# Patient Record
Sex: Female | Born: 1955
Health system: Southern US, Community
[De-identification: ages and names within clinical notes are randomized; demographics above are authoritative.]

## PROBLEM LIST (undated history)

## (undated) DIAGNOSIS — I10 Essential (primary) hypertension: Secondary | ICD-10-CM

## (undated) DIAGNOSIS — G8929 Other chronic pain: Secondary | ICD-10-CM

## (undated) DIAGNOSIS — D563 Thalassemia minor: Secondary | ICD-10-CM

## (undated) DIAGNOSIS — Z5189 Encounter for other specified aftercare: Secondary | ICD-10-CM

## (undated) DIAGNOSIS — M549 Dorsalgia, unspecified: Secondary | ICD-10-CM

## (undated) DIAGNOSIS — F419 Anxiety disorder, unspecified: Secondary | ICD-10-CM

## (undated) DIAGNOSIS — M1990 Unspecified osteoarthritis, unspecified site: Secondary | ICD-10-CM

## (undated) DIAGNOSIS — K635 Polyp of colon: Secondary | ICD-10-CM

## (undated) DIAGNOSIS — D573 Sickle-cell trait: Secondary | ICD-10-CM

## (undated) DIAGNOSIS — R011 Cardiac murmur, unspecified: Secondary | ICD-10-CM

## (undated) DIAGNOSIS — K219 Gastro-esophageal reflux disease without esophagitis: Secondary | ICD-10-CM

## (undated) DIAGNOSIS — R1013 Epigastric pain: Secondary | ICD-10-CM

## (undated) DIAGNOSIS — E785 Hyperlipidemia, unspecified: Secondary | ICD-10-CM

## (undated) HISTORY — DX: Sickle-cell trait: D57.3

## (undated) HISTORY — DX: Unspecified osteoarthritis, unspecified site: M19.90

## (undated) HISTORY — DX: Dorsalgia, unspecified: M54.9

## (undated) HISTORY — DX: Hyperlipidemia, unspecified: E78.5

## (undated) HISTORY — DX: Anxiety disorder, unspecified: F41.9

## (undated) HISTORY — DX: Gastro-esophageal reflux disease without esophagitis: K21.9

## (undated) HISTORY — DX: Essential (primary) hypertension: I10

## (undated) HISTORY — DX: Polyp of colon: K63.5

## (undated) HISTORY — DX: Encounter for other specified aftercare: Z51.89

## (undated) HISTORY — DX: Thalassemia minor: D56.3

## (undated) HISTORY — DX: Cardiac murmur, unspecified: R01.1

## (undated) HISTORY — PX: COLONOSCOPY: SHX174

## (undated) HISTORY — PX: ESOPHAGOGASTRODUODENOSCOPY: SHX1529

## (undated) HISTORY — PX: ABDOMINAL HYSTERECTOMY: SHX81

---

## 2005-03-07 ENCOUNTER — Ambulatory Visit (HOSPITAL_COMMUNITY): Admission: RE | Admit: 2005-03-07 | Discharge: 2005-03-07 | Payer: Self-pay | Admitting: Emergency Medicine

## 2005-07-01 ENCOUNTER — Emergency Department (HOSPITAL_COMMUNITY): Admission: EM | Admit: 2005-07-01 | Discharge: 2005-07-01 | Payer: Self-pay | Admitting: Emergency Medicine

## 2005-07-01 ENCOUNTER — Encounter (INDEPENDENT_AMBULATORY_CARE_PROVIDER_SITE_OTHER): Payer: Self-pay | Admitting: *Deleted

## 2007-06-23 ENCOUNTER — Emergency Department (HOSPITAL_COMMUNITY): Admission: EM | Admit: 2007-06-23 | Discharge: 2007-06-23 | Payer: Self-pay | Admitting: Emergency Medicine

## 2008-10-15 ENCOUNTER — Encounter (INDEPENDENT_AMBULATORY_CARE_PROVIDER_SITE_OTHER): Payer: Self-pay | Admitting: *Deleted

## 2009-07-20 ENCOUNTER — Ambulatory Visit: Payer: Self-pay | Admitting: Gastroenterology

## 2009-07-20 DIAGNOSIS — R1013 Epigastric pain: Secondary | ICD-10-CM

## 2009-07-20 DIAGNOSIS — K3189 Other diseases of stomach and duodenum: Secondary | ICD-10-CM | POA: Insufficient documentation

## 2009-07-29 ENCOUNTER — Telehealth: Payer: Self-pay | Admitting: Gastroenterology

## 2009-08-05 ENCOUNTER — Encounter: Payer: Self-pay | Admitting: Gastroenterology

## 2009-08-08 ENCOUNTER — Telehealth: Payer: Self-pay | Admitting: Gastroenterology

## 2009-08-08 ENCOUNTER — Encounter: Payer: Self-pay | Admitting: Gastroenterology

## 2009-08-12 ENCOUNTER — Encounter: Payer: Self-pay | Admitting: Gastroenterology

## 2010-02-16 ENCOUNTER — Ambulatory Visit (HOSPITAL_COMMUNITY): Admission: RE | Admit: 2010-02-16 | Discharge: 2010-02-16 | Payer: Self-pay | Admitting: Emergency Medicine

## 2011-01-14 ENCOUNTER — Encounter: Payer: Self-pay | Admitting: Emergency Medicine

## 2011-10-01 ENCOUNTER — Other Ambulatory Visit: Payer: Self-pay | Admitting: Gastroenterology

## 2012-04-05 ENCOUNTER — Ambulatory Visit (INDEPENDENT_AMBULATORY_CARE_PROVIDER_SITE_OTHER): Payer: BC Managed Care – PPO | Admitting: Family Medicine

## 2012-04-05 ENCOUNTER — Ambulatory Visit: Payer: BC Managed Care – PPO

## 2012-04-05 DIAGNOSIS — R109 Unspecified abdominal pain: Secondary | ICD-10-CM

## 2012-04-05 LAB — POCT UA - MICROSCOPIC ONLY
Casts, Ur, LPF, POC: NEGATIVE
Yeast, UA: NEGATIVE

## 2012-04-05 LAB — POCT URINALYSIS DIPSTICK
Ketones, UA: 15
Spec Grav, UA: 1.02
pH, UA: 6

## 2012-04-05 LAB — POCT CBC
HCT, POC: 40.9 % (ref 37.7–47.9)
Hemoglobin: 13.3 g/dL (ref 12.2–16.2)
MCH, POC: 24.9 pg — AB (ref 27–31.2)
MCHC: 32.5 g/dL (ref 31.8–35.4)
MCV: 76.4 fL — AB (ref 80–97)
RBC: 5.35 M/uL (ref 4.04–5.48)
RDW, POC: 15.1 %

## 2012-04-05 MED ORDER — ONDANSETRON 4 MG PO TBDP: 4.0000 mg | ORAL_TABLET | Freq: Three times a day (TID) | ORAL | Status: AC | PRN

## 2012-04-05 MED ORDER — TRAMADOL HCL 50 MG PO TABS: 50.0000 mg | ORAL_TABLET | Freq: Three times a day (TID) | ORAL | Status: AC | PRN

## 2012-04-05 NOTE — Progress Notes (Signed)
Subjective:    Patient ID: Emily [name], female    DOB: 1956/07/27, 56 y.o.   MRN: [id number]  HPI 56 yo female with abdominal pain and diarrhea.  Abdominal pain, bilateral, lower for 4 weeks.  Sharp pain.  Constipation previously, diarrhea today x 3.  Takes Nexium.  All the time.  Nexium helps a little.  Pain bad before eating.  No change with bowel movements.  No fever.  Positive dysuria.  No vaginal discharge.  Nausea after eating.  No vomitting.   S/p hysterectomy, nothing else.  10 years ago.    Review of Systems Negative except as per HPI     Objective:   Physical Exam  Constitutional: Vital signs are normal. She appears well-developed and well-nourished. She is active.  Cardiovascular: Normal rate, regular rhythm, normal heart sounds and normal pulses.   Pulmonary/Chest: Effort normal and breath sounds normal.  Abdominal: Soft. Normal appearance and bowel sounds are normal. She exhibits no distension and no mass. There is no hepatosplenomegaly. There is tenderness. There is no rigidity, no rebound, no guarding, no CVA tenderness, no tenderness at McBurney's point and negative Murphy's sign. No hernia.       Diffuse tenderness.  Upper abd worse than lower, though she endorses she feels her pain in the upper abdomen.   Neurological: She is alert.      Results for orders placed in visit on 04/05/12  POCT CBC      Component Value Range   WBC 8.7  4.6 - 10.2 (K/uL)   Lymph, poc 2.9  0.6 - 3.4    POC LYMPH PERCENT 33.1  10 - 50 (%L)   MID (cbc) 0.8  0 - 0.9    POC MID % 9.4  0 - 12 (%M)   POC Granulocyte 5.0  2 - 6.9    Granulocyte percent 57.5  37 - 80 (%G)   RBC 5.35  4.04 - 5.48 (M/uL)   Hemoglobin 13.3  12.2 - 16.2 (g/dL)   HCT, POC 98.1  19.1 - 47.9 (%)   MCV 76.4 (*) 80 - 97 (fL)   MCH, POC 24.9 (*) 27 - 31.2 (pg)   MCHC 32.5  31.8 - 35.4 (g/dL)   RDW, POC 47.8     Platelet Count, POC 332  142 - 424 (K/uL)   MPV 9.0  0 - 99.8 (fL)  POCT UA - MICROSCOPIC ONLY   Component Value Range   WBC, Ur, HPF, POC 0-2     RBC, urine, microscopic 0-2     Bacteria, U Microscopic trace     Mucus, UA trace     Epithelial cells, urine per micros 0-1     Crystals, Ur, HPF, POC trace     Casts, Ur, LPF, POC neg     Yeast, UA neg    POCT URINALYSIS DIPSTICK      Component Value Range   Color, UA yellow     Clarity, UA clear     Glucose, UA neg     Bilirubin, UA neg     Ketones, UA 15     Spec Grav, UA 1.020     Blood, UA neg     pH, UA 6.0     Protein, UA trace     Urobilinogen, UA 0.2     Nitrite, UA neg     Leukocytes, UA Trace     [facility] Primary radiology reading by Dr. Georgiana Shore: NAD    Assessment & Plan:  Abdominal pain - normal CBC, xray, urine.  Question GB disease given pain to palpation in upper abdomen.  Diverticulits possible as well but no discrete tenderness in LLQ and normal white count.  Zofran, tramadol now.  Cmet, lipase penidng.  Set up for abdominal ultrasound to evaluate the gall bladder.

## 2012-04-06 LAB — LIPASE: Lipase: 22 U/L (ref 0–75)

## 2012-04-06 LAB — COMPREHENSIVE METABOLIC PANEL
ALT: 14 U/L (ref 0–35)
Glucose, Bld: 80 mg/dL (ref 70–99)
Sodium: 139 mEq/L (ref 135–145)

## 2012-04-10 ENCOUNTER — Telehealth: Payer: Self-pay | Admitting: Radiology

## 2012-04-10 ENCOUNTER — Ambulatory Visit
Admission: RE | Admit: 2012-04-10 | Discharge: 2012-04-10 | Disposition: A | Discharge status: Home / Self Care | Payer: BC Managed Care – PPO | Source: Ambulatory Visit | Attending: Family Medicine | Admitting: Family Medicine

## 2012-04-10 NOTE — Telephone Encounter (Signed)
Message copied by Levon Hedger A on Thu Apr 10, 2012  2:42 PM ------      Message from: Lamar Laundry      Created: Thu Apr 10, 2012  2:22 PM       Please let patient know her ultrasound was normal.  Is she still having bad abdominal pain?  If so she should return.

## 2012-04-11 NOTE — Telephone Encounter (Signed)
Spoke with daughter and patient is having abdominal pain.  Would like to know if we could refer her for endoscopy?

## 2012-04-11 NOTE — Telephone Encounter (Signed)
LMOM to call back

## 2012-04-11 NOTE — Telephone Encounter (Signed)
Pt daughter is returning clinical phone call # [phone]

## 2012-04-12 NOTE — Telephone Encounter (Signed)
The patient is advised to RTC if her symptoms persist.  Referral can be made to the appropriate specialist at that time.

## 2012-04-12 NOTE — Telephone Encounter (Signed)
Spoke with daughter and advised to have her mom RTC to discuss. She understood

## 2012-06-30 ENCOUNTER — Ambulatory Visit: Payer: BC Managed Care – PPO

## 2012-10-02 ENCOUNTER — Ambulatory Visit: Payer: BC Managed Care – PPO | Admitting: Family Medicine

## 2012-10-02 VITALS — BP 100/68 | HR 73 | Temp 98.7°F | Resp 16 | Ht 65.0 in | Wt 195.0 lb

## 2012-10-02 DIAGNOSIS — E785 Hyperlipidemia, unspecified: Secondary | ICD-10-CM

## 2012-10-02 DIAGNOSIS — I10 Essential (primary) hypertension: Secondary | ICD-10-CM

## 2012-10-02 MED ORDER — PRAVASTATIN SODIUM 80 MG PO TABS: 80.0000 mg | ORAL_TABLET | Freq: Every day | ORAL | Status: DC

## 2012-10-02 MED ORDER — NIFEDIPINE ER 30 MG PO TB24: 30.0000 mg | ORAL_TABLET | Freq: Every day | ORAL | Status: DC

## 2012-10-02 MED ORDER — METOPROLOL TARTRATE 25 MG PO TABS: 25.0000 mg | ORAL_TABLET | Freq: Every morning | ORAL | Status: DC

## 2012-10-02 NOTE — Progress Notes (Signed)
[facility]:  Office Visit  Chief Complaint:  Chief Complaint  Patient presents with  . Medication Refill    HPI: Emily [name] is a 56 y.o. spanish speaking  female who complains of : 1. HTN med refills. No SEs. Taking meds regular/ Denies CP, SOB, dizziness, nausea, vomiting 2. XOL med refills. No SEs. Taking meds regular. She denies any muscular aches/pains. Has not fasted.  She was seen here in April 2013 and labs showed that her Alk Phos was elevated, she ahd abd pain at the time. A RUQ [place] was done which was normal. She has been to the GI specialist and they have dx her with GERD.  Past Medical History  Diagnosis Date  . Hypertension   . Hyperlipidemia   . Back pain   . GERD (gastroesophageal reflux disease)    History reviewed. No pertinent past surgical history. History   Social History  . Marital Status: Married    Spouse Name: [name]    Number of Children: [name]  . Years of Education: [name]   Social History Main Topics  . Smoking status: Never Smoker   . Smokeless tobacco: None  . Alcohol Use: Yes  . Drug Use: No  . Sexually Active: None   Other Topics Concern  . None   Social History Narrative  . None   Family History  Problem Relation Age of Onset  . Aneurysm Mother    Allergies  Allergen Reactions  . Aspirin   . Nsaids    Prior to Admission medications   Medication Sig Start Date End Date Taking? Authorizing Provider  metoprolol tartrate (LOPRESSOR) 25 MG tablet Take 25 mg by mouth every morning.   Yes Historical Provider, MD  NIFEdipine (PROCARDIA-XL/ADALAT CC) 30 MG 24 hr tablet Take 30 mg by mouth daily.   Yes Historical Provider, MD  pravastatin (PRAVACHOL) 80 MG tablet Take 80 mg by mouth daily.   Yes Historical Provider, MD     ROS: The patient denies fevers, chills, night sweats, unintentional weight loss, chest pain, palpitations, wheezing, dyspnea on exertion, nausea, vomiting, abdominal pain, dysuria,  hematuria, melena, numbness, weakness, or tingling.   All other systems have been reviewed and were otherwise negative with the exception of those mentioned in the HPI and as above.    PHYSICAL EXAM: Filed Vitals:   10/02/12 1859  BP: 100/68  Pulse: 73  Temp: 98.7 F (37.1 C)  Resp: 16   Filed Vitals:   10/02/12 1859  Height: 5\' 5"  (1.651 m)  Weight: 195 lb (88.451 kg)   Body mass index is 32.45 kg/(m^2).  General: Alert, no acute distress HEENT:  Normocephalic, atraumatic, oropharynx patent. EOMI, PERRLA, fundoscopic exam grossly nl, no exudates Cardiovascular:  Regular rate and rhythm, no rubs murmurs or gallops.  No Carotid bruits, radial pulse intact. No pedal edema.  Respiratory: Clear to auscultation bilaterally.  No wheezes, rales, or rhonchi.  No cyanosis, no use of accessory musculature GI: No organomegaly, abdomen is soft and non-tender, positive bowel sounds.  No masses. Skin: No rashes. Neurologic: Facial musculature symmetric. Psychiatric: Patient is appropriate throughout our interaction. Lymphatic: No cervical lymphadenopathy Musculoskeletal: Gait intact.   LABS: Results for orders placed in visit on 04/05/12  POCT CBC      Component Value Range   WBC 8.7  4.6 - 10.2 K/uL   Lymph, poc 2.9  0.6 - 3.4   POC LYMPH PERCENT 33.1  10 - 50 %L  MID (cbc) 0.8  0 - 0.9   POC MID % 9.4  0 - 12 %M   POC Granulocyte 5.0  2 - 6.9   Granulocyte percent 57.5  37 - 80 %G   RBC 5.35  4.04 - 5.48 M/uL   Hemoglobin 13.3  12.2 - 16.2 g/dL   HCT, POC 78.2  95.6 - 47.9 %   MCV 76.4 (*) 80 - 97 fL   MCH, POC 24.9 (*) 27 - 31.2 pg   MCHC 32.5  31.8 - 35.4 g/dL   RDW, POC 21.3     Platelet Count, POC 332  142 - 424 K/uL   MPV 9.0  0 - 99.8 fL  POCT UA - MICROSCOPIC ONLY      Component Value Range   WBC, Ur, HPF, POC 0-2     RBC, urine, microscopic 0-2     Bacteria, U Microscopic trace     Mucus, UA trace     Epithelial cells, urine per micros 0-1     Crystals, Ur,  HPF, POC trace     Casts, Ur, LPF, POC neg     Yeast, UA neg    POCT URINALYSIS DIPSTICK      Component Value Range   Color, UA yellow     Clarity, UA clear     Glucose, UA neg     Bilirubin, UA neg     Ketones, UA 15     Spec Grav, UA 1.020     Blood, UA neg     pH, UA 6.0     Protein, UA trace     Urobilinogen, UA 0.2     Nitrite, UA neg     Leukocytes, UA Trace    COMPREHENSIVE METABOLIC PANEL      Component Value Range   Sodium 139  135 - 145 mEq/L   Potassium 3.8  3.5 - 5.3 mEq/L   Chloride 104  96 - 112 mEq/L   CO2 30  19 - 32 mEq/L   Glucose, Bld 80  70 - 99 mg/dL   BUN 14  6 - 23 mg/dL   Creat 0.86  5.78 - 4.69 mg/dL   Total Bilirubin 0.2 (*) 0.3 - 1.2 mg/dL   Alkaline Phosphatase 136 (*) 39 - 117 U/L   AST 18  0 - 37 U/L   ALT 14  0 - 35 U/L   Total Protein 7.7  6.0 - 8.3 g/dL   Albumin 4.1  3.5 - 5.2 g/dL   Calcium 9.9  8.4 - 62.9 mg/dL  LIPASE      Component Value Range   Lipase 22  0 - 75 U/L     EKG/XRAY:   Primary read interpreted by Dr. Conley Rolls at [facility].   ASSESSMENT/PLAN: Encounter Diagnoses  Name Primary?  . HTN (hypertension) Yes  . Hyperlipidemia    Refilled meds x 6 months Will get CMP and also LDL since patient ate Asked pt to return in 6 months for fasting lipids at that time, today only LDL.     Delanee Xin PHUONG, DO 10/03/2012 12:11 AM

## 2012-10-03 ENCOUNTER — Encounter: Payer: Self-pay | Admitting: Family Medicine

## 2012-10-03 DIAGNOSIS — E785 Hyperlipidemia, unspecified: Secondary | ICD-10-CM | POA: Insufficient documentation

## 2012-10-03 DIAGNOSIS — I10 Essential (primary) hypertension: Secondary | ICD-10-CM | POA: Insufficient documentation

## 2012-10-03 LAB — COMPREHENSIVE METABOLIC PANEL WITH GFR
AST: 15 U/L (ref 0–37)
Alkaline Phosphatase: 128 U/L — ABNORMAL HIGH (ref 39–117)
BUN: 14 mg/dL (ref 6–23)
Glucose, Bld: 90 mg/dL (ref 70–99)
Sodium: 137 meq/L (ref 135–145)
Total Bilirubin: 0.3 mg/dL (ref 0.3–1.2)
Total Protein: 7.8 g/dL (ref 6.0–8.3)

## 2012-10-03 LAB — COMPREHENSIVE METABOLIC PANEL
ALT: 10 U/L (ref 0–35)
Albumin: 4.3 g/dL (ref 3.5–5.2)
CO2: 28 mEq/L (ref 19–32)
Calcium: 10.6 mg/dL — ABNORMAL HIGH (ref 8.4–10.5)
Chloride: 100 mEq/L (ref 96–112)
Creat: 0.66 mg/dL (ref 0.50–1.10)
Potassium: 4.2 mEq/L (ref 3.5–5.3)

## 2012-10-03 LAB — LDL CHOLESTEROL, DIRECT: Direct LDL: 174 mg/dL — ABNORMAL HIGH

## 2012-10-23 ENCOUNTER — Encounter: Payer: Self-pay | Admitting: Family Medicine

## 2012-10-31 ENCOUNTER — Ambulatory Visit: Payer: BC Managed Care – PPO | Admitting: Physician Assistant

## 2012-10-31 VITALS — BP 104/74 | HR 72 | Temp 98.1°F | Resp 18 | Ht 65.0 in | Wt 200.2 lb

## 2012-10-31 DIAGNOSIS — R300 Dysuria: Secondary | ICD-10-CM

## 2012-10-31 DIAGNOSIS — R350 Frequency of micturition: Secondary | ICD-10-CM

## 2012-10-31 DIAGNOSIS — N390 Urinary tract infection, site not specified: Secondary | ICD-10-CM

## 2012-10-31 LAB — POCT UA - MICROSCOPIC ONLY

## 2012-10-31 LAB — POCT URINALYSIS DIPSTICK
Bilirubin, UA: NEGATIVE
Ketones, UA: NEGATIVE

## 2012-10-31 MED ORDER — NITROFURANTOIN MONOHYD MACRO 100 MG PO CAPS: 100.0000 mg | ORAL_CAPSULE | Freq: Two times a day (BID) | ORAL | Status: AC

## 2012-10-31 MED ORDER — PHENAZOPYRIDINE HCL 200 MG PO TABS: 200.0000 mg | ORAL_TABLET | Freq: Three times a day (TID) | ORAL | Status: DC | PRN

## 2012-10-31 NOTE — Progress Notes (Signed)
   [address], [place] [address]   Phone [phone]  Subjective:    Patient ID: Emily [name], female    DOB: 02-07-1956, 56 y.o.   MRN: [id number]  HPI Pt presents to clinic with 2 wks h/o dysuria and urinary frequency and urgency.  She has abd pressure and back pain with some chills today.  The back pain is related to a current works comp case and is taking Flexeril - her pain has not changed since her urine symptoms have started.  She is having no vaginal symptoms.  She has not been taking her BP medications over the last month and her BP has been low - she is scared to take the meds because her BP is low.     Review of Systems  Constitutional: Positive for chills. Negative for fever.  Genitourinary: Positive for dysuria, urgency and frequency. Negative for flank pain and vaginal discharge.       Objective:   Physical Exam  Vitals reviewed. Constitutional: She is oriented to person, place, and time. She appears well-developed and well-nourished.  HENT:  Head: Normocephalic and atraumatic.  Right Ear: External ear normal.  Left Ear: External ear normal.  Eyes: Conjunctivae normal are normal.  Cardiovascular: Normal rate, regular rhythm and normal heart sounds.   Pulmonary/Chest: Effort normal.  Abdominal: Soft. There is tenderness (suprapubic). There is no rebound, no guarding and no CVA tenderness.  Neurological: She is alert and oriented to person, place, and time.  Skin: Skin is warm and dry.  Psychiatric: She has a normal mood and affect. Her behavior is normal. Judgment and thought content normal.      Assessment & Plan:   1. Dysuria  POCT UA - Microscopic Only, POCT urinalysis dipstick, phenazopyridine (PYRIDIUM) 200 MG tablet  2. Urinary frequency  POCT UA - Microscopic Only, POCT urinalysis dipstick  3. UTI (lower urinary tract infection)  Urine culture, nitrofurantoin, macrocrystal-monohydrate, (MACROBID) 100 MG capsule   Push fluids.  F/u if  problems.

## 2012-11-02 LAB — URINE CULTURE
Colony Count: NO GROWTH
Organism ID, Bacteria: NO GROWTH

## 2013-03-24 ENCOUNTER — Telehealth: Payer: Self-pay | Admitting: Radiology

## 2013-03-24 NOTE — Telephone Encounter (Signed)
Patients daughter asking for Metoprolol refill, but states is 50mg  daily. Called pharmacy to clarify dose, and she has had both. Dr Conley Rolls sent in Metoprolol Tartrate 25mg . Metoprolol Succinate 50mg  was previously written by Dr Janeice Robinson, but he has denied this. Please clarify what you want her to take.

## 2013-03-25 ENCOUNTER — Emergency Department (HOSPITAL_COMMUNITY): Payer: BC Managed Care – PPO

## 2013-03-25 ENCOUNTER — Emergency Department (HOSPITAL_COMMUNITY)
Admission: EM | Admit: 2013-03-25 | Discharge: 2013-03-25 | Disposition: A | Discharge status: Home / Self Care | Payer: BC Managed Care – PPO | Attending: Emergency Medicine | Admitting: Emergency Medicine

## 2013-03-25 ENCOUNTER — Telehealth: Payer: Self-pay

## 2013-03-25 ENCOUNTER — Encounter (HOSPITAL_COMMUNITY): Payer: Self-pay | Admitting: Emergency Medicine

## 2013-03-25 DIAGNOSIS — R5381 Other malaise: Secondary | ICD-10-CM | POA: Insufficient documentation

## 2013-03-25 DIAGNOSIS — Z8719 Personal history of other diseases of the digestive system: Secondary | ICD-10-CM | POA: Insufficient documentation

## 2013-03-25 DIAGNOSIS — R51 Headache: Secondary | ICD-10-CM | POA: Insufficient documentation

## 2013-03-25 DIAGNOSIS — Z8739 Personal history of other diseases of the musculoskeletal system and connective tissue: Secondary | ICD-10-CM | POA: Insufficient documentation

## 2013-03-25 DIAGNOSIS — E785 Hyperlipidemia, unspecified: Secondary | ICD-10-CM | POA: Insufficient documentation

## 2013-03-25 DIAGNOSIS — R110 Nausea: Secondary | ICD-10-CM | POA: Insufficient documentation

## 2013-03-25 DIAGNOSIS — Z79899 Other long term (current) drug therapy: Secondary | ICD-10-CM | POA: Insufficient documentation

## 2013-03-25 DIAGNOSIS — I10 Essential (primary) hypertension: Secondary | ICD-10-CM | POA: Insufficient documentation

## 2013-03-25 LAB — BASIC METABOLIC PANEL
BUN: 11 mg/dL (ref 6–23)
Calcium: 9.8 mg/dL (ref 8.4–10.5)
Creatinine, Ser: 0.52 mg/dL (ref 0.50–1.10)
GFR calc non Af Amer: >90 mL/min (ref >90)
Glucose, Bld: 77 mg/dL (ref 70–99)

## 2013-03-25 LAB — CBC WITH DIFFERENTIAL/PLATELET
Hemoglobin: 13.1 g/dL (ref 12.0–15.0)
Lymphocytes Relative: 32 % (ref 12–46)
Lymphs Abs: 2.3 10*3/uL (ref 0.7–4.0)
Monocytes Relative: 10 % (ref 3–12)
Neutro Abs: 3.6 10*3/uL (ref 1.7–7.7)
RBC: 5.14 MIL/uL — ABNORMAL HIGH (ref 3.87–5.11)
WBC: 7.1 10*3/uL (ref 4.0–10.5)

## 2013-03-25 MED ORDER — HYDROCODONE-ACETAMINOPHEN 5-325 MG PO TABS: 2.0000 | ORAL_TABLET | ORAL | Status: DC | PRN

## 2013-03-25 MED ORDER — METOPROLOL SUCCINATE ER 50 MG PO TB24: 50.0000 mg | ORAL_TABLET | Freq: Every day | ORAL | Status: DC

## 2013-03-25 MED ORDER — IOHEXOL 350 MG/ML SOLN
100.0000 mL | Freq: Once | INTRAVENOUS | Status: AC | PRN
  Administered 2013-03-25: 100 mL via INTRAVENOUS

## 2013-03-25 NOTE — ED Notes (Signed)
Since Sunday pt has had intermit headache and weakness daughter noticed that pt is not her normal and slow with walking and talking. Hx of HTN, tinglinging feeling to head and arms. Pt speak spanish.

## 2013-03-25 NOTE — ED Notes (Signed)
Patient transported to CT 

## 2013-03-25 NOTE — Telephone Encounter (Signed)
I have sent Metop XL 50mg  to be taken once daily.  She should STOP taking the metop tartrate

## 2013-03-25 NOTE — Telephone Encounter (Signed)
It looks like patient has been sent in the immediate release and extended once daily dosing.  I think that it would be best for patient to RTC because I saw her last and per my note she was having hypotensive episodes.  When she RTC we can figure out which med is best for her.

## 2013-03-25 NOTE — Telephone Encounter (Signed)
There were 2 messages on this, patient was advised previous message she should come in for recheck prior to Rx being given, because she has episodes of hypotension and she has been given 3 different Rx by 2 different providers, unclear at pharmacy and unclear here what she is to take. Daughter aware.

## 2013-03-25 NOTE — Telephone Encounter (Signed)
Thanks, I called her daughter and advised.

## 2013-03-25 NOTE — Telephone Encounter (Signed)
PATIENT'S DAUGHTER IS CALLING. STATES THAT HER MOTHER LAST SAW DR LE AND THAT SHE WROTE THE WRONG RX FOR HER MOTHER. PATIENT'S DAUGHTER WAS EXTREMELY HARD TO UNDERSTAND AND MUFFLED. DID NOT GET HER NAME BUT HER CALL BACK NUMBER IS: [phone]

## 2013-03-25 NOTE — Telephone Encounter (Signed)
Please advise patients daughter requesting Metoprolol Succinate 50 as written at another practice., Dr Conley Rolls gave Metoprolol Tartrate 25mg  . Pharmacy indicates patient has been on both of these recently, message sent yesterday also to Dr Conley Rolls. This will have to go to her again, but to you first, if you have advise, please advise.

## 2013-03-25 NOTE — ED Provider Notes (Signed)
History     CSN: [id number]  Arrival date & time 03/25/13  1210   First MD Initiated Contact with Patient 03/25/13 1235      Chief Complaint  Patient presents with  . Headache  . Nausea  . Weakness    (Consider location/radiation/quality/duration/timing/severity/associated sxs/prior treatment) HPI Comments: Patient comes to the ER for evaluation of headache. Patient has been complaining of pain in the head for 2 days. Pain is in the posterior aspect of the head and is associated with numbness, tingling and tenderness of the scalp overlying the region. She has not had any neck pain or neck stiffness. No fever. Headache waxes and wanes. No visual disturbance. Denies chest pain. She has had some tingling in the Center of her chest intermittently but no shortness of breath. No alleviating or exacerbating factors of the tingling of the chest.  Patient and daughter are concerned about the headache because there are multiple members of the family who have had cerebral aneurysms.  Patient is a 57 y.o. female presenting with headaches and weakness.  Headache Weakness Associated symptoms include headaches.    Past Medical History  Diagnosis Date  . Hypertension   . Hyperlipidemia   . Back pain   . GERD (gastroesophageal reflux disease)     History reviewed. No pertinent past surgical history.  Family History  Problem Relation Age of Onset  . Aneurysm Mother     History  Substance Use Topics  . Smoking status: Never Smoker   . Smokeless tobacco: Not on file  . Alcohol Use: Yes    OB History   Grav Para Term Preterm Abortions TAB SAB Ect Mult Living                  Review of Systems  Neurological: Positive for weakness and headaches.  All other systems reviewed and are negative.    Allergies  Aspirin and Nsaids  Home Medications   Current Outpatient Rx  Name  Route  Sig  Dispense  Refill  . NIFEdipine (PROCARDIA-XL/ADALAT CC) 30 MG 24 hr tablet   Oral   Take  30 mg by mouth daily.         . pravastatin (PRAVACHOL) 80 MG tablet   Oral   Take 1 tablet (80 mg total) by mouth daily.   30 tablet   6     BP 115/74  Pulse 74  Temp(Src) 98 F (36.7 C) (Oral)  Wt 206 lb 12.7 oz (93.801 kg)  BMI 34.41 kg/m2  SpO2 94%  Physical Exam  Constitutional: She is oriented to person, place, and time. She appears well-developed and well-nourished. No distress.  HENT:  Head: Normocephalic and atraumatic.  Right Ear: Hearing normal.  Nose: Nose normal.  Mouth/Throat: Oropharynx is clear and moist and mucous membranes are normal.  Eyes: Conjunctivae and EOM are normal. Pupils are equal, round, and reactive to light.  Neck: Normal range of motion. Neck supple.  Cardiovascular: Normal rate, regular rhythm, S1 normal and S2 normal.  Exam reveals no gallop and no friction rub.   No murmur heard. Pulmonary/Chest: Effort normal and breath sounds normal. No respiratory distress. She exhibits no tenderness.  Abdominal: Soft. Normal appearance and bowel sounds are normal. There is no hepatosplenomegaly. There is no tenderness. There is no rebound, no guarding, no tenderness at McBurney's point and negative Murphy's sign. No hernia.  Musculoskeletal: Normal range of motion.  Neurological: She is alert and oriented to person, place, and time. She has  normal strength. No cranial nerve deficit or sensory deficit. Coordination normal. GCS eye subscore is 4. GCS verbal subscore is 5. GCS motor subscore is 6.  Skin: Skin is warm, dry and intact. No rash noted. No cyanosis.  Psychiatric: She has a normal mood and affect. Her speech is normal and behavior is normal. Thought content normal.    ED Course  Procedures (including critical care time)  EKG:  Date: 03/25/2013  Rate: 75  Rhythm: normal sinus rhythm  QRS Axis: normal  Intervals: normal  ST/T Wave abnormalities: normal  Conduction Disutrbances:none  Narrative Interpretation:   Old EKG Reviewed: none  available     Labs Reviewed  CBC WITH DIFFERENTIAL - Abnormal; Notable for the following:    RBC 5.14 (*)    MCV 74.1 (*)    MCH 25.5 (*)    All other components within normal limits  BASIC METABOLIC PANEL  TROPONIN I   Ct Head Wo Contrast  03/25/2013  *RADIOLOGY REPORT*  Clinical Data: Severe posterior headaches for 1 day.  CT HEAD WITHOUT CONTRAST  Technique:  Contiguous axial images were obtained from the base of the skull through the vertex without contrast.  Comparison: None.  Findings: No acute infarct, hemorrhage, or mass lesion is present. The ventricles are normal size.  No significant extra-axial fluid collection is present.  Mild mucosal thickening is present in the posterior maxillary sinuses bilaterally.  The paranasal sinuses and mastoid air cells are otherwise clear.  The osseous skull is intact.  IMPRESSION:  1.  Normal CT appearance the brain. 2.  Mild maxillary sinus disease.   Original Report Authenticated By: Marin Roberts, M.D.      Diagnosis: Headache    MDM  This presents to the ER for evaluation of headache. Patient has been having intermittent posterior occipital region headache with overlying tenderness. There is no rash or skin changes. Patient concerned about the possibility of aneurysm because of family history. Initial head CT without contrast did not show any abnormality. Patient was therefore sent back for CT angiography of head and this was normal as well. Patient reassured, no further treatment or workup necessary for the headache.  Patient did describe some tingling sensation in her chest. There was no clear evidence of chest pain. She has a normal troponin and EKG. Symptoms have been present for several days. Followup as an outpatient.        Gilda Crease, MD 03/25/13 [phone]

## 2013-03-28 ENCOUNTER — Other Ambulatory Visit: Payer: Self-pay | Admitting: Physician Assistant

## 2013-03-28 ENCOUNTER — Ambulatory Visit: Payer: BC Managed Care – PPO

## 2013-03-28 ENCOUNTER — Ambulatory Visit: Payer: BC Managed Care – PPO | Admitting: Internal Medicine

## 2013-03-28 VITALS — BP 110/77 | HR 66 | Temp 98.6°F | Resp 16 | Ht 66.0 in | Wt 201.0 lb

## 2013-03-28 DIAGNOSIS — R51 Headache: Secondary | ICD-10-CM

## 2013-03-28 DIAGNOSIS — I10 Essential (primary) hypertension: Secondary | ICD-10-CM

## 2013-03-28 DIAGNOSIS — E7800 Pure hypercholesterolemia, unspecified: Secondary | ICD-10-CM

## 2013-03-28 DIAGNOSIS — M62838 Other muscle spasm: Secondary | ICD-10-CM

## 2013-03-28 LAB — LIPID PANEL
LDL Cholesterol: 128 mg/dL — ABNORMAL HIGH (ref 0–99)
Total CHOL/HDL Ratio: 5.2 Ratio
VLDL: 41 mg/dL — ABNORMAL HIGH (ref 0–40)

## 2013-03-28 LAB — COMPREHENSIVE METABOLIC PANEL
ALT: 17 U/L (ref 0–35)
AST: 17 U/L (ref 0–37)
Albumin: 4.2 g/dL (ref 3.5–5.2)
Alkaline Phosphatase: 129 U/L — ABNORMAL HIGH (ref 39–117)
Calcium: 10 mg/dL (ref 8.4–10.5)
Chloride: 102 mEq/L (ref 96–112)
Potassium: 4.3 mEq/L (ref 3.5–5.3)

## 2013-03-28 MED ORDER — NIFEDIPINE ER 30 MG PO TB24: 30.0000 mg | ORAL_TABLET | Freq: Every day | ORAL | Status: DC

## 2013-03-28 MED ORDER — METOPROLOL SUCCINATE ER 25 MG PO TB24: 25.0000 mg | ORAL_TABLET | Freq: Every day | ORAL | Status: DC

## 2013-03-28 MED ORDER — CYCLOBENZAPRINE HCL 5 MG PO TABS: 5.0000 mg | ORAL_TABLET | Freq: Three times a day (TID) | ORAL | Status: DC | PRN

## 2013-03-28 NOTE — Patient Instructions (Addendum)
Please go home and call me with the medications that you are on. I need to know whether you are on Metoprolol succinate or metoprolol tartrate.  For headache - I think it is coming from your neck.  Heat and massage might help.  You will be on muscle relaxers and please take Tylenol for pain.

## 2013-03-28 NOTE — Progress Notes (Signed)
   [address], [place] [address]   Phone [phone]  Subjective:    Patient ID: Emily [name], female    DOB: 1956-04-02, 57 y.o.   MRN: [id number]  HPI Pt presents to clinic with headache and concerns about her BP.  She was in the ED on Wednesday with headache and had a CT scan which was normal as well as a normal exam.  She is concerned because she wakes up most mornings with a headache that is in the back of her head and it gets better with Tylenol.  She has no photophobia or nausea.  She is worried about her BP but is not sure about the toprol she takes. She checks her BP at home and it is mostly in the 130/80-90s.     Pt is out of work on a WC injury on her lower back.     Review of Systems  Constitutional: Negative for fever and chills.  HENT: Negative for congestion and sore throat.   Musculoskeletal: Positive for back pain (from WC injury).  Allergic/Immunologic: Negative for environmental allergies.  Neurological: Positive for headaches.       Objective:   Physical Exam  Vitals reviewed. Constitutional: She is oriented to person, place, and time. She appears well-developed and well-nourished.  HENT:  Head: Normocephalic and atraumatic.  Right Ear: External ear normal.  Left Ear: External ear normal.  Eyes: Conjunctivae are normal.  Cardiovascular: Normal rate, regular rhythm and normal heart sounds.   No murmur heard. Pulmonary/Chest: Effort normal.  Musculoskeletal:       Cervical back: She exhibits tenderness, bony tenderness and spasm.       Back:  Neurological: She is alert and oriented to person, place, and time.  Skin: Skin is warm and dry.  Psychiatric: She has a normal mood and affect. Her behavior is normal. Judgment and thought content normal.    [facility] reading (PRIMARY) by  Dr. Merla Riches. Slight narrowing C4-5 C7-T1, slight decrease in lordosis.    Assessment & Plan:  Pure hypercholesterolemia - - check labs and adjust meds if indicated.   Plan: Lipid panel, Comprehensive metabolic panel  Headache - with a normal CT scan and exam today that points to neck etiology will treat muscle spasm with muscle relaxer and tylenol and heat with massage.  She will plan on RTC in 2 wks if no better and we might consider PT or HA referral.  Plan: DG Cervical Spine 2 or 3 views  Neck muscle spasm - Plan: cyclobenzaprine (FLEXERIL) 5 MG tablet  HTN (hypertension) - Pt pt go home and look at medication name for her Toprol because she has been on 2 different types of Toprol.  I need to know exactly which one she is taking - she is not having problems with hypotension symptoms but with her BP being low we might need to decrease one of her medications.  She will also bring her cuff in next visit for comparison. - Plan: NIFEdipine (PROCARDIA-XL/ADALAT CC) 30 MG 24 hr tablet  Pt speaks minimal English - husband interprets.  I have reviewed and agree with documentation. Robert P. Merla Riches, M.D.

## 2013-04-01 ENCOUNTER — Other Ambulatory Visit: Payer: Self-pay | Admitting: Physician Assistant

## 2013-04-01 DIAGNOSIS — I10 Essential (primary) hypertension: Secondary | ICD-10-CM

## 2013-04-01 DIAGNOSIS — E785 Hyperlipidemia, unspecified: Secondary | ICD-10-CM

## 2013-04-01 MED ORDER — PRAVASTATIN SODIUM 80 MG PO TABS: 80.0000 mg | ORAL_TABLET | Freq: Every day | ORAL | Status: DC

## 2013-04-02 ENCOUNTER — Telehealth: Payer: Self-pay

## 2013-04-02 NOTE — Telephone Encounter (Signed)
LAB RESULTS. [phone].

## 2013-04-02 NOTE — Telephone Encounter (Signed)
Gave daughter lab results

## 2013-04-09 ENCOUNTER — Telehealth: Payer: Self-pay

## 2013-04-09 DIAGNOSIS — E785 Hyperlipidemia, unspecified: Secondary | ICD-10-CM

## 2013-04-09 DIAGNOSIS — I10 Essential (primary) hypertension: Secondary | ICD-10-CM

## 2013-04-09 NOTE — Telephone Encounter (Signed)
Patient's daughter came in to talk about her mother's prescriptions.  Her name was not listed on the form in the patient's documents.  I was unable to give her daughter any information.  Renee' D. assisted.  Luster Landsberg' advised the daughter to have the mother come in and fill out a new form to list her if she wanted her daughter to handle issues for her.  The patient needs to have someone call her that speaks spanish.  The mother's number that is in the system is incorrect according to the daughter.  The correct number is [phone].  This number will be changed once it has been verified by the patient to be correct per Renee'.

## 2013-04-09 NOTE — Telephone Encounter (Signed)
Medications were all sent within the last two weeks:  03/28/13 - Toprol XL 25mg , #30 03/28/13 - Procardia XL 30mg , #30 1RF 04/01/13 - Pravachol 80mg , #30 6RF

## 2013-04-09 NOTE — Telephone Encounter (Signed)
Emily [name] Called the number given and advised these have all been sent. Called and patient is requesting additional refills to have at the pharmacy, a lot of this is lost with language barrier, pended refills on Toprol, others have refills avail.

## 2013-04-09 NOTE — Telephone Encounter (Signed)
Patients medications are confusing, still. Her daughter indicates she needs Metoprolol succinate 25mg , Nifedipine 30mg , and Pravastatin 80mg , please advise if we can send these in. They are pended. Call back number is [phone]. Emily [name]

## 2013-04-09 NOTE — Telephone Encounter (Signed)
Discussed with Benny Lennert PA-C who last saw pt, let's have pt return to recheck BP as her dose of Toprol was changed at last visit.  Then if she is stable, we can send with RFs.

## 2013-04-10 NOTE — Telephone Encounter (Signed)
Thanks, I have called her and advised.

## 2013-04-27 ENCOUNTER — Other Ambulatory Visit: Payer: Self-pay | Admitting: Physician Assistant

## 2013-05-27 ENCOUNTER — Other Ambulatory Visit: Payer: Self-pay | Admitting: Physician Assistant

## 2013-09-25 ENCOUNTER — Other Ambulatory Visit: Payer: Self-pay | Admitting: Physician Assistant

## 2013-09-27 ENCOUNTER — Other Ambulatory Visit: Payer: Self-pay | Admitting: Physician Assistant

## 2013-10-11 ENCOUNTER — Other Ambulatory Visit: Payer: Self-pay | Admitting: Family Medicine

## 2013-10-12 ENCOUNTER — Other Ambulatory Visit: Payer: Self-pay | Admitting: Physician Assistant

## 2013-10-28 ENCOUNTER — Other Ambulatory Visit: Payer: Self-pay | Admitting: Physician Assistant

## 2013-11-15 ENCOUNTER — Other Ambulatory Visit: Payer: Self-pay | Admitting: Physician Assistant

## 2013-11-17 ENCOUNTER — Other Ambulatory Visit: Payer: Self-pay | Admitting: Physician Assistant

## 2013-11-27 ENCOUNTER — Other Ambulatory Visit: Payer: Self-pay | Admitting: Physician Assistant

## 2013-12-02 ENCOUNTER — Other Ambulatory Visit: Payer: Self-pay | Admitting: *Deleted

## 2013-12-02 ENCOUNTER — Other Ambulatory Visit: Payer: Self-pay | Admitting: Physician Assistant

## 2013-12-02 MED ORDER — PRAVASTATIN SODIUM 80 MG PO TABS: 80.0000 mg | ORAL_TABLET | Freq: Every day | ORAL | Status: DC

## 2013-12-03 ENCOUNTER — Other Ambulatory Visit: Payer: Self-pay | Admitting: Physician Assistant

## 2013-12-03 ENCOUNTER — Telehealth: Payer: Self-pay

## 2013-12-03 NOTE — Telephone Encounter (Signed)
Patient's daughter Toniann Fail calling about her mother's RX for Pravastatin and Metoprolol. States that the pharmacy informed her that she has no more refills left. Explained to patient's daughter that patient needs an OV to get these meds refilled. Patient's daughter states that her mother no longer has insurance and is need of these meds. Please advise.  [phone]

## 2013-12-06 ENCOUNTER — Other Ambulatory Visit: Payer: Self-pay | Admitting: Physician Assistant

## 2013-12-07 NOTE — Telephone Encounter (Signed)
Dr Merla Riches, see also phone message from 12/03/13.

## 2013-12-07 NOTE — Telephone Encounter (Signed)
Amy, I'm not sure you ever saw this and worried that it may not have routed correctly? I discovered message when researching Rx request. I have routed the Rx req to Dr Merla Riches for review, and will send this to him also so he'll have this info.  Dr Merla Riches, I have routed the Rx req also.

## 2013-12-09 NOTE — Telephone Encounter (Signed)
Ok to refill BP and choles meds for 3 months and give her # for [employer]) where it would be free and they speak spanish

## 2013-12-10 MED ORDER — METOPROLOL SUCCINATE ER 25 MG PO TB24: ORAL_TABLET | ORAL | Status: DC

## 2013-12-10 MED ORDER — PRAVASTATIN SODIUM 80 MG PO TABS: ORAL_TABLET | ORAL | Status: DC

## 2013-12-10 NOTE — Telephone Encounter (Signed)
Sent these in for her.

## 2013-12-10 NOTE — Addendum Note (Signed)
Addended byCaffie Damme on: 12/10/2013 03:18 PM   Modules accepted: Orders

## 2013-12-10 NOTE — Telephone Encounter (Signed)
Called daughter Toniann Fail to advise.

## 2013-12-10 NOTE — Telephone Encounter (Signed)
  [address] [place], [place] [address] Practice Phone: [phone]

## 2014-01-02 ENCOUNTER — Ambulatory Visit (INDEPENDENT_AMBULATORY_CARE_PROVIDER_SITE_OTHER): Payer: BC Managed Care – PPO | Admitting: Family Medicine

## 2014-01-02 VITALS — BP 110/80 | HR 75 | Temp 99.0°F | Resp 16 | Ht 65.0 in | Wt 200.0 lb

## 2014-01-02 DIAGNOSIS — R110 Nausea: Secondary | ICD-10-CM

## 2014-01-02 DIAGNOSIS — F418 Other specified anxiety disorders: Secondary | ICD-10-CM

## 2014-01-02 DIAGNOSIS — R079 Chest pain, unspecified: Secondary | ICD-10-CM

## 2014-01-02 DIAGNOSIS — I10 Essential (primary) hypertension: Secondary | ICD-10-CM

## 2014-01-02 DIAGNOSIS — R42 Dizziness and giddiness: Secondary | ICD-10-CM

## 2014-01-02 DIAGNOSIS — R51 Headache: Secondary | ICD-10-CM

## 2014-01-02 DIAGNOSIS — F411 Generalized anxiety disorder: Secondary | ICD-10-CM

## 2014-01-02 LAB — POCT CBC
Granulocyte percent: 62.6 %G (ref 37–80)
HCT, POC: 42.2 % (ref 37.7–47.9)
HEMOGLOBIN: 13.1 g/dL (ref 12.2–16.2)
Lymph, poc: 2.4 (ref 0.6–3.4)
MCH: 25 pg — AB (ref 27–31.2)
MCHC: 31 g/dL — AB (ref 31.8–35.4)
MCV: 80.6 fL (ref 80–97)
MID (cbc): 0.4 (ref 0–0.9)
MPV: 9.6 fL (ref 0–99.8)
POC GRANULOCYTE: 4.6 (ref 2–6.9)
POC LYMPH PERCENT: 32 %L (ref 10–50)
POC MID %: 5.4 % (ref 0–12)
Platelet Count, POC: 297 10*3/uL (ref 142–424)
RBC: 5.23 M/uL (ref 4.04–5.48)
RDW, POC: 14.9 %
WBC: 7.4 10*3/uL (ref 4.6–10.2)

## 2014-01-02 LAB — GLUCOSE, POCT (MANUAL RESULT ENTRY): POC GLUCOSE: 79 mg/dL (ref 70–99)

## 2014-01-02 MED ORDER — ALPRAZOLAM 0.25 MG PO TABS: 0.2500 mg | ORAL_TABLET | Freq: Three times a day (TID) | ORAL | Status: DC | PRN

## 2014-01-02 NOTE — Progress Notes (Signed)
Subjective:    Patient ID: Emily [name], female    DOB: 03-17-1956, 58 y.o.   MRN: [id number]  HPI Emily [name] is a 58 y.o. female  Hx of HTN, last seen in 03/2013 - see notes from that ov, and phone note with plan for repeat eval recommended to discuss medication regimen. Has not followed up since then, but phone note in December - meds refilled for 3 months.  Here today with concerns of blood pressure - higher past 3-4 days. 140/90 2 days ago.  Had chest pain and stabbing sensation in chest - yesterday, all day. Nausea at times, including with chest pain yesterday. No hx of MI or known heart problems.  No chest pain today. Admits to palpitations and slight dizzy when these sx's happened. Noticed pain then checked blood pressure with above reading - took blood pressure medicine.  Thinks is related to new medicine -  nifedipine - different pill from December when picked up from pharmacy.    (thinks different medicine was prescribed by pharmacy in december - but 5-10 minutes spent discussing this and same medicine, but possible different generic, reviewed chart and prior prescriptions)  Also takes metoprolol ER 25mg  qd. No recent change in this dose. Later stated felt dizzy when 1st started taking medicine. No further dizziness, just occasional headache - past 2 days. Did not take blood pressure medicine today - concern of symptoms coming from medicine.   Grandson in hospital currently, she is stressed with this. Denies depression/SI. Some difficulty with sleep d/t anxiousness. Also some difficulty sleeping from being out of work from w/c injury with back. Has taken xanax in past - does not have currently.   Spanish speaking - spanish spoken, interpreter present, with clarification of history at times.  Results for orders placed in visit on 03/28/13  LIPID PANEL      Result Value Range   Cholesterol 209 (*) 0 - 200 mg/dL   Triglycerides 204 (*) <150 mg/dL   HDL 40  >39 mg/dL   Total  CHOL/HDL Ratio 5.2     VLDL 41 (*) 0 - 40 mg/dL   LDL Cholesterol 128 (*) 0 - 99 mg/dL  COMPREHENSIVE METABOLIC PANEL      Result Value Range   Sodium 137  135 - 145 mEq/L   Potassium 4.3  3.5 - 5.3 mEq/L   Chloride 102  96 - 112 mEq/L   CO2 27  19 - 32 mEq/L   Glucose, Bld 87  70 - 99 mg/dL   BUN 11  6 - 23 mg/dL   Creat 0.72  0.50 - 1.10 mg/dL   Total Bilirubin 0.3  0.3 - 1.2 mg/dL   Alkaline Phosphatase 129 (*) 39 - 117 U/L   AST 17  0 - 37 U/L   ALT 17  0 - 35 U/L   Total Protein 7.8  6.0 - 8.3 g/dL   Albumin 4.2  3.5 - 5.2 g/dL   Calcium 10.0  8.4 - 10.5 mg/dL   Patient Active Problem List   Diagnosis Date Noted  . HTN (hypertension) 10/03/2012  . Hyperlipidemia 10/03/2012  . DYSPEPSIA&OTHER [facility] DISORDERS FUNCTION STOMACH 07/20/2009   Past Medical History  Diagnosis Date  . Hypertension   . Hyperlipidemia   . Back pain   . GERD (gastroesophageal reflux disease)    Past Surgical History  Procedure Laterality Date  . Abdominal hysterectomy     Allergies  Allergen Reactions  . Aspirin Hives  .  Nsaids Hives   Prior to Admission medications   Medication Sig Start Date End Date Taking? Authorizing Provider  cyclobenzaprine (FLEXERIL) 5 MG tablet Take 1 tablet (5 mg total) by mouth 3 (three) times daily as needed for muscle spasms. 03/28/13  Yes Mancel Bale, PA-C  metoprolol succinate (TOPROL-XL) 25 MG 24 hr tablet TAKE 1 TABLET BY MOUTH DAILY. NEEDS OFFICE VISIT 12/10/13  Yes Leandrew Koyanagi, MD  NIFEdipine (PROCARDIA-XL/ADALAT CC) 30 MG 24 hr tablet Take 1 tablet (30 mg total) by mouth daily. 03/28/13  Yes Mancel Bale, PA-C  pravastatin (PRAVACHOL) 80 MG tablet TAKE 1 TABLET BY MOUTH DAILY. NEED OFFICE APPOINTMENT FOR FURTHER REFILLS 12/10/13  Yes Leandrew Koyanagi, MD  ALPRAZolam Duanne Moron) 0.25 MG tablet Take 1 tablet (0.25 mg total) by mouth 3 (three) times daily as needed for anxiety. 01/02/14   Wendie Agreste, MD  HYDROcodone-acetaminophen (NORCO/VICODIN)  5-325 MG per tablet Take 2 tablets by mouth every 4 (four) hours as needed for pain. 03/25/13   Orpah Greek, MD  NIFEdipine (PROCARDIA-XL/ADALAT CC) 30 MG 24 hr tablet Take 1 tablet (30 mg total) by mouth daily. Need office visit for additional refills. 3rd Notice. 11/27/13   Fara Chute, PA-C   History   Social History  . Marital Status: Married    Spouse Name: [name]    Number of Children: [name]  . Years of Education: [name]   Occupational History  . Not on file.   Social History Main Topics  . Smoking status: Never Smoker   . Smokeless tobacco: Not on file  . Alcohol Use: Yes  . Drug Use: No  . Sexual Activity: Not on file   Other Topics Concern  . Not on file   Social History Narrative  . No narrative on file    Review of Systems  Constitutional: Negative for fever and chills.  Respiratory: Negative for shortness of breath.   Cardiovascular: Positive for chest pain and palpitations. Negative for leg swelling.  Gastrointestinal: Positive for nausea. Negative for vomiting, abdominal pain and blood in stool.  Neurological: Positive for light-headedness. Negative for dizziness, syncope and headaches.       Objective:   Physical Exam  Vitals reviewed. Constitutional: She is oriented to person, place, and time. She appears well-developed and well-nourished.  HENT:  Head: Normocephalic and atraumatic.  Eyes: Conjunctivae and EOM are normal. Pupils are equal, round, and reactive to light.  Neck: Carotid bruit is not present.  Cardiovascular: Normal rate, regular rhythm, normal heart sounds and intact distal pulses.   Pulmonary/Chest: Effort normal and breath sounds normal.  Abdominal: Soft. She exhibits no pulsatile midline mass. There is no tenderness.  Neurological: She is alert and oriented to person, place, and time. She has normal strength. No sensory deficit.  Nonfocal.   Skin: Skin is warm and dry.  Psychiatric: She has a normal mood and affect. Her behavior  is normal.   Filed Vitals:   01/02/14 1446  BP: 110/80  Pulse: 75  Temp: 99 F (37.2 C)  TempSrc: Oral  Resp: 16  Height: 5\' 5"  (1.651 m)  Weight: 200 lb (90.719 kg)  SpO2: 97%   EKG: SR, twi in III only. No acute findings otherwise. No changes seen from prior EKG.   Results for orders placed in visit on 01/02/14  GLUCOSE, POCT (MANUAL RESULT ENTRY)      Result Value Range   POC Glucose 79  70 - 99 mg/dl  POCT  CBC      Result Value Range   WBC 7.4  4.6 - 10.2 K/uL   Lymph, poc 2.4  0.6 - 3.4   POC LYMPH PERCENT 32.0  10 - 50 %L   MID (cbc) 0.4  0 - 0.9   POC MID % 5.4  0 - 12 %M   POC Granulocyte 4.6  2 - 6.9   Granulocyte percent 62.6  37 - 80 %G   RBC 5.23  4.04 - 5.48 M/uL   Hemoglobin 13.1  12.2 - 16.2 g/dL   HCT, POC 42.2  37.7 - 47.9 %   MCV 80.6  80 - 97 fL   MCH, POC 25.0 (*) 27 - 31.2 pg   MCHC 31.0 (*) 31.8 - 35.4 g/dL   RDW, POC 14.9     Platelet Count, POC 297  142 - 424 K/uL   MPV 9.6  0 - 99.8 fL      Assessment & Plan:   Emily [name] is a 58 y.o. female Chest pain - Plan: POCT glucose (manual entry), Comprehensive metabolic panel, EKG [id number], single episode day prior with associated palpitations, but resolved on own and asx now. No apparent changes on EKG, and underlying stress may be contributory, but to rtc/ER, or call 911 if symptoms return for further evaluation.   Nausea alone - Plan: POCT glucose (manual entry), Comprehensive metabolic panel - intermittent, check CMP, hx of GERD. discuss further at follow up, but rtc precautions given.   Unspecified essential hypertension - controlled in office with systolic [id number] off meds today. Possible falsely elevated readings on home machine - to bring this in and list of home readings at follow up in next 4 days.  If higher readings than past few days, can return sooner or ER precautions as above   Additionally she feels like she may be having side effects to new rx of procardia - discussed this is the same  medicine as prescribed before, but given control in office off meds today - can try to hold Procardia, continue Toprol KL 25 mg qd, and option to increase toprol as well, but can recheck in few days. May not need both rx's.   Situational anxiety - Plan: ALPRAZolam (XANAX) 0.25 MG tablet - family stressor with grandson in hospital. xanax rx given, sed.  Consider counseling.   Headache(784.0) - check home bp's on just toprol and discuss further in follow up in 4 days. Rtc/er precautions.   Lightheadedness - Plan: POCT CBC reassuring and nonfocal neuro exam.  asx at present. rtc if recurs.   Spanish spoken and interpreter present with understanding expressed.    Meds ordered this encounter  Medications  . ALPRAZolam (XANAX) 0.25 MG tablet    Sig: Take 1 tablet (0.25 mg total) by mouth 3 (three) times daily as needed for anxiety.    Dispense:  20 tablet    Refill:  0    Label in spanish   Patient Instructions  Pare procardia en este tiempo (tome metoprolol)  chequie su presion 2 veces al dia y apunte sus [place] y sus numeros regrese a visita en cuatro dias. si tiene dolor en su pecho - llame a 911 o va a cuarto de emergencia.  Xanax si necesario. regrese mas temprano si peor.

## 2014-01-02 NOTE — Patient Instructions (Signed)
Pare procardia en este tiempo (tome metoprolol)  chequie su presion 2 veces al dia y apunte sus [place] y sus numeros regrese a visita en cuatro dias. si tiene dolor en su pecho - llame a 911 o va a cuarto de emergencia.  Xanax si necesario. regrese mas temprano si peor.

## 2014-01-03 LAB — COMPREHENSIVE METABOLIC PANEL
ALT: 15 U/L (ref 0–35)
AST: 16 U/L (ref 0–37)
Albumin: 4.3 g/dL (ref 3.5–5.2)
Alkaline Phosphatase: 132 U/L — ABNORMAL HIGH (ref 39–117)
BILIRUBIN TOTAL: 0.4 mg/dL (ref 0.3–1.2)
BUN: 17 mg/dL (ref 6–23)
CO2: 29 meq/L (ref 19–32)
Calcium: 10.3 mg/dL (ref 8.4–10.5)
Chloride: 103 mEq/L (ref 96–112)
Creat: 0.65 mg/dL (ref 0.50–1.10)
Glucose, Bld: 81 mg/dL (ref 70–99)
POTASSIUM: 4.1 meq/L (ref 3.5–5.3)
SODIUM: 140 meq/L (ref 135–145)
TOTAL PROTEIN: 7.8 g/dL (ref 6.0–8.3)

## 2014-01-11 ENCOUNTER — Ambulatory Visit (INDEPENDENT_AMBULATORY_CARE_PROVIDER_SITE_OTHER): Payer: BC Managed Care – PPO | Admitting: Family Medicine

## 2014-01-11 VITALS — BP 116/72 | HR 76 | Temp 99.0°F | Resp 18 | Ht 65.0 in | Wt 201.0 lb

## 2014-01-11 DIAGNOSIS — I10 Essential (primary) hypertension: Secondary | ICD-10-CM

## 2014-01-11 DIAGNOSIS — E785 Hyperlipidemia, unspecified: Secondary | ICD-10-CM

## 2014-01-11 DIAGNOSIS — R748 Abnormal levels of other serum enzymes: Secondary | ICD-10-CM

## 2014-01-11 MED ORDER — METOPROLOL SUCCINATE ER 25 MG PO TB24: ORAL_TABLET | ORAL | Status: DC

## 2014-01-11 MED ORDER — PRAVASTATIN SODIUM 80 MG PO TABS: ORAL_TABLET | ORAL | Status: DC

## 2014-01-11 NOTE — Progress Notes (Signed)
Subjective:    Patient ID: Emily [name], female    DOB: [birth year], 57 y.o.   MRN: [id number]  This chart was scribed for Emily Agreste, MD by Maree Erie, ED Scribe. The patient was seen in room 9. Patient's care was started at 8:36 PM.  Chief Complaint  Patient presents with  . Follow-up    chest pain  . rx refills    pravachol, nifedipine, toprol   PCP: No primary provider on file.  HPI  Emily [name] is a 58 y.o. female who presents to office for a follow up. Last seen nine days ago with concerns of BP, palpitations, chest pain but had resolution of chest pain by the time she was in the office. Also with situational anxiety with her grandson in the hospital. Additionally had HA, lightheadedness and nausea. Discussed med concerns in office as she felt her new dose of Procardia contributing to her symptoms but BP controlled in office off of medication that day. Held Procardia. Continued Toprol with plan to recheck in a few days. Xanax prescription given last office visit.  EKG showed no acute findings or changes from prior.   Hypertension: She states she has been checking her BP at home. She states that after the last visit she stopped taking the Procardia and was taking one or two tablets of the Toprol intermittently. She would not take medications on days that she believed her BP was dropping too low. Since last being seen, she either took one pill, two pills or no pills. Highest BP recorded was 147/87 this morning. Overall running from 105/76 to 130s/80s. She states that she took two pills this morning but did not take any yesterday. She did not understand the directions last visit to take only one pill of Toprol everyday and increase to two pills only if the BP was too elevated. She has not had any more chest pain, dizziness, nausea since stopping the Procardia. She has had a slight headache however, on days when her BP was elevated.  Abdominal Pain: She has intermittent abdominal  pain that she would like to get checked at the next appointment. She has had this pain for years and denies any changes or worsening of the pain. She takes Nexium for heartburn. She has a lot of gas from the pain and occasional diarrhea. Past surgical history of hysterectomy but no other abdominal surgeries.  Hyperlipidemia: She also states that she needs to have her cholesterol checked.Last lipid panel April 2014, LDL 128, total 204, HDL 40. She last ate a meal at 2 PM but ate an apple at 6 or 7 PM. Lab Results  Component Value Date   CHOL 209* 03/28/2013   HDL 40 03/28/2013   LDLCALC 128* 03/28/2013   LDLDIRECT 174* 10/02/2012   TRIG 204* 03/28/2013   CHOLHDL 5.2 03/28/2013   Elevated ALK Phos of 132 on labs from nine days ago.    She states [facility] is her PCP, but does not have a single provider picked out.   Review of Systems  Constitutional: Negative for fever.  HENT: Negative for drooling.   Eyes: Negative for discharge.  Respiratory: Negative for cough.   Cardiovascular: Negative for leg swelling.  Gastrointestinal: Positive for abdominal pain and diarrhea. Negative for vomiting.  Endocrine: Negative for polyuria.  Genitourinary: Negative for hematuria.  Musculoskeletal: Negative for gait problem.  Skin: Negative for rash.  Allergic/Immunologic: Negative for immunocompromised state.  Neurological: Positive for headaches. Negative for speech difficulty.  Hematological: Negative for adenopathy.  Psychiatric/Behavioral: Negative for confusion.       Objective:   Physical Exam  Nursing note and vitals reviewed. Constitutional: She is oriented to person, place, and time. She appears well-developed and well-nourished. No distress.  HENT:  Head: Normocephalic and atraumatic.  Eyes: Conjunctivae and EOM are normal. Pupils are equal, round, and reactive to light.  Neck: Neck supple. Carotid bruit is not present. No tracheal deviation present.  Cardiovascular: Normal rate, regular rhythm,  normal heart sounds and intact distal pulses.   Pulmonary/Chest: Effort normal and breath sounds normal. No respiratory distress.  Abdominal: Soft. She exhibits no pulsatile midline mass. There is no tenderness. There is negative Murphy's sign.  No RUQ tenderness.   Musculoskeletal: Normal range of motion.  Neurological: She is alert and oriented to person, place, and time.  Skin: Skin is warm and dry.  Psychiatric: She has a normal mood and affect. Her behavior is normal.    Filed Vitals:   01/11/14 2010  BP: 116/72  Pulse: 76  Temp: 99 F (37.2 C)  TempSrc: Oral  Resp: 18  Height: 5\' 5"  (1.651 m)  Weight: 201 lb (91.173 kg)  SpO2: 94%        Assessment & Plan:   Emily [name] is a 58 y.o. female HTN (hypertension) - Plan: metoprolol succinate (TOPROL-XL) 25 MG 24 hr tablet, Comprehensive metabolic panel - overall appears controlled on just metoprolol, but varying dosing may be causing some ups and downs. Recommended consistent 25mg  qd dosing, and keep home BP record. Recheck with numbers in 2-4 weeks, sooner if any return of chest pains, headaches, or other new symptoms.  Spanish spoken and understanding expressed.   Other and unspecified hyperlipidemia - Plan: pravastatin (PRAVACHOL) 80 MG tablet refilled, check lipids.  Comprehensive metabolic panel, Lipid panel  Elevated alkaline phosphatase level - Plan: Comprehensive metabolic panel to recheck.    Meds ordered this encounter  Medications  . metoprolol succinate (TOPROL-XL) 25 MG 24 hr tablet    Sig: TAKE 1 TABLET BY MOUTH DAILY.    Dispense:  90 tablet    Refill:  1  . pravastatin (PRAVACHOL) 80 MG tablet    Sig: TAKE 1 TABLET BY MOUTH DAILY.    Dispense:  90 tablet    Refill:  1   Patient Instructions  Un pastilla de metoprolol cada dia. regrese en 2-4 semanas.  Mas temprano si necesario, o curato de emergencia si tiene dolor de pecho.      I personally performed the services described in this  documentation, which was scribed in my presence. The recorded information has been reviewed and considered, and addended by me as needed.

## 2014-01-11 NOTE — Patient Instructions (Signed)
Un pastilla de metoprolol cada dia. regrese en 2-4 semanas.  Mas temprano si necesario, o curato de emergencia si tiene dolor de pecho.

## 2014-01-12 LAB — COMPREHENSIVE METABOLIC PANEL
ALK PHOS: 135 U/L — AB (ref 39–117)
ALT: 15 U/L (ref 0–35)
AST: 18 U/L (ref 0–37)
Albumin: 4.2 g/dL (ref 3.5–5.2)
BILIRUBIN TOTAL: 0.3 mg/dL (ref 0.3–1.2)
BUN: 10 mg/dL (ref 6–23)
CO2: 26 mEq/L (ref 19–32)
Calcium: 9.5 mg/dL (ref 8.4–10.5)
Chloride: 103 mEq/L (ref 96–112)
Creat: 0.69 mg/dL (ref 0.50–1.10)
Glucose, Bld: 91 mg/dL (ref 70–99)
POTASSIUM: 4.1 meq/L (ref 3.5–5.3)
SODIUM: 137 meq/L (ref 135–145)
TOTAL PROTEIN: 7.4 g/dL (ref 6.0–8.3)

## 2014-01-12 LAB — LIPID PANEL
Cholesterol: 222 mg/dL — ABNORMAL HIGH (ref 0–200)
HDL: 46 mg/dL (ref >39)
LDL CALC: 143 mg/dL — AB (ref 0–99)
Total CHOL/HDL Ratio: 4.8 Ratio
Triglycerides: 166 mg/dL — ABNORMAL HIGH (ref <150)
VLDL: 33 mg/dL (ref 0–40)

## 2014-01-18 ENCOUNTER — Telehealth: Payer: Self-pay | Admitting: Family Medicine

## 2014-01-18 NOTE — Telephone Encounter (Signed)
approx 20 minute phone call. Spanish spoken and clarification of dosing multiple times, but eventually determined that she has been taking 1 metoprolol each day, and BP measured 150/100, and 148/98. Had some headache with this, so started 1/2 pill of nifedipine yesterday and today with her metoprolol. BP 120/79, and 116/80 with this med. Discussed her prior concerns with nifedipine and not recommended to split this formulation. Recommended stopping nifedipine, can increase to (2) of metoprolol qd - total dose of 50mg , and recheck in 3 days with outside BP reading log, and to bring home BP machine to correlate with readings here. orthostatic and low BP precautions discussed, understanding expressed.   Also discussed elevated LDL and CMP results from labs last week, denies missed doses of meds. Already on 80mg  pravastatin qd.  Will discuss other options, possible Crestor at ov in 3 days. Understanding expressed at end of phone call.

## 2014-01-21 ENCOUNTER — Ambulatory Visit: Payer: BC Managed Care – PPO

## 2014-01-22 ENCOUNTER — Other Ambulatory Visit: Payer: Self-pay | Admitting: Physician Assistant

## 2014-01-22 ENCOUNTER — Telehealth: Payer: Self-pay

## 2014-01-22 NOTE — Telephone Encounter (Signed)
Blood Pressure report: 160/101 155/102 150/101  Pt reports to daughter that she has only been taking the Metoprolol. She has had headaches, dizziness, and feels nervousness. She is also taking Xanax.  She was suppose to follow up with you on Thursday but they arrived after you had left for the day.   Please advise if pt should start taking Procardia again or increase Metoprolol.   Advised pt to come in follow up with you on Tuesday. Advised her to come in sooner if the dizziness and headaches return. Pt understands.

## 2014-01-22 NOTE — Telephone Encounter (Signed)
Dr.Greene, [name] daughter Abigail Butts states that the metoprolol is not working pts b/p is low. Pt would like to switch back to the medication that she was taking before switching to metoprolol.  Best# [phone]

## 2014-01-22 NOTE — Telephone Encounter (Signed)
Dr Carlota Raspberry, it appears there is some question about whether you want pt taking this med w/recent phone messages. Please review.

## 2014-01-22 NOTE — Telephone Encounter (Signed)
Lm for Emily [name]- Would like to get what her mother's readings have been and find out what she has been taking. Last ov there was a change in medication. She was taken off of Procardia.

## 2014-01-25 NOTE — Telephone Encounter (Signed)
Call pt - check status.  Was she taking 1 or 2 metoprolol as discussed in last phone message?  If having headache and dizziness - recommend eval by provider today, bring meter to office to verify she is obtaining accurate readings at home. If improved, can follow up with me at 102 tomorrow morning.

## 2014-01-25 NOTE — Telephone Encounter (Signed)
Emily [name] called back and her mother has been taking 2 Metoprolol

## 2014-01-25 NOTE — Telephone Encounter (Signed)
Spoke with Abigail Butts (daughter) and she is going to call me back to let me know if her mother is taking 1 or 2 Metoprolol. She said her mother thinks that stopping previous medication, nifedipine, is what is causing her sxs. She was advised to RTC and states she is going to come in tomorrow 01/26/14 to see Dr. Carlota Raspberry

## 2014-01-26 ENCOUNTER — Other Ambulatory Visit: Payer: Self-pay | Admitting: Physician Assistant

## 2014-01-26 ENCOUNTER — Ambulatory Visit (INDEPENDENT_AMBULATORY_CARE_PROVIDER_SITE_OTHER): Payer: BC Managed Care – PPO | Admitting: Family Medicine

## 2014-01-26 VITALS — BP 110/70 | HR 67 | Temp 98.2°F | Resp 16 | Ht 65.0 in | Wt 202.0 lb

## 2014-01-26 DIAGNOSIS — I10 Essential (primary) hypertension: Secondary | ICD-10-CM

## 2014-01-26 DIAGNOSIS — F418 Other specified anxiety disorders: Secondary | ICD-10-CM

## 2014-01-26 DIAGNOSIS — F411 Generalized anxiety disorder: Secondary | ICD-10-CM

## 2014-01-26 DIAGNOSIS — Z87898 Personal history of other specified conditions: Secondary | ICD-10-CM

## 2014-01-26 DIAGNOSIS — E785 Hyperlipidemia, unspecified: Secondary | ICD-10-CM

## 2014-01-26 MED ORDER — ROSUVASTATIN CALCIUM 20 MG PO TABS: 20.0000 mg | ORAL_TABLET | Freq: Every day | ORAL | Status: DC

## 2014-01-26 MED ORDER — ALPRAZOLAM 0.25 MG PO TABS: 0.2500 mg | ORAL_TABLET | Freq: Three times a day (TID) | ORAL | Status: DC | PRN

## 2014-01-26 MED ORDER — NIFEDIPINE ER OSMOTIC RELEASE 30 MG PO TB24: 30.0000 mg | ORAL_TABLET | Freq: Every day | ORAL | Status: DC

## 2014-01-26 NOTE — Patient Instructions (Signed)
Tome una pastilla metoprolol 25mg  cada dia, una pastilla nifedipina 30mg  cada dia.  cambio pravastatin a crestor 20mg  cada dia.  llame de telefono terapista para hablar ansiedad: Pryor Montes: [phone]  regrese en 2 semanas, mas temprano si tiene nueva simptomas o peoras.

## 2014-01-26 NOTE — Progress Notes (Addendum)
Subjective:  This chart was scribed for Emily Ray, MD by Eugenia Mcalpine, ED Scribe. This patient was seen in room 5 and the patient's care was started at 9:31 AM.   Patient ID: Emily [name], female    DOB: 1956-07-02, 58 y.o.   MRN: [id number]  HPI Spanish speaking pt; here for follow up HTN.  Initially seen jan 10 with concerns of CP and palpitations and concern of possible elevated BP at home.  Had been on metoprolol 25 mg pd , nifedipine 30mg  pd, but had been prescribe a new generic; felt like symptoms were due to this.  At that visit off meds and BP 110/80. Decided to just try metoprolol 25 mg qd.  Follow up on jan 19th intermittent dosing of metoprolol either 1 pill 2 pills or no pill. But resolution of CP and palpitations off the nifedipine. Recommended consistent 25 mg QD dosing and to keep home BP record. Multiple phone calls since that office visit. Jan 26th recommended metoprolol 25 mg qd and to recheck in 3 days. Phone note on jan 30 elevated home BP, 150-160/101-102.   Per that note has been taking 2 metoprolol but had been having HA dizziness and nervousness. Was prescribed xanax at jan 10th visit for situational anxiety.   We had a plan to take 2 pills a day but on that plan for 2 pills a day she had HA and BP 160/110.  When she had elevation of BP she had HA and she felt that her vision was different. She took one of her nifedipine for the past 3 days with 1 metoprolol, and now she feels better. Her home BP for the past 3 days are 126/88, 118/80, 136/81.  She denies CP, HA, and dizziness recently.   Hyperlipidemia: Her lipid panel on jan 19: total cholesterol was 222, LDL was 143, HDL 46, trig 166; currently takes pravastatin 80 mg qd. She has been taking pravastatin everyday with no missed doses and no side effects.   She would like to see a cardiologist for a fast or hard beats that were noted in the past while in [place]  For her situational anxiety the pt has taken xanax  3x a day, and feels like nerves more calm.    Lab Results  Component Value Date   CREATININE 0.69 01/11/2014   BUN 10 01/11/2014   NA 137 01/11/2014   K 4.1 01/11/2014   CL 103 01/11/2014   CO2 26 01/11/2014    Patient Active Problem List   Diagnosis Date Noted  . HTN (hypertension) 10/03/2012  . Hyperlipidemia 10/03/2012  . DYSPEPSIA&OTHER [facility] DISORDERS FUNCTION STOMACH 07/20/2009   Past Medical History  Diagnosis Date  . Hypertension   . Hyperlipidemia   . Back pain   . GERD (gastroesophageal reflux disease)    Past Surgical History  Procedure Laterality Date  . Abdominal hysterectomy     Allergies  Allergen Reactions  . Aspirin Hives  . Nsaids Hives   Prior to Admission medications   Medication Sig Start Date End Date Taking? Authorizing Provider  ALPRAZolam (XANAX) 0.25 MG tablet Take 1 tablet (0.25 mg total) by mouth 3 (three) times daily as needed for anxiety. 01/02/14  Yes Wendie Agreste, MD  cyclobenzaprine (FLEXERIL) 5 MG tablet Take 1 tablet (5 mg total) by mouth 3 (three) times daily as needed for muscle spasms. 03/28/13  Yes Mancel Bale, PA-C  HYDROcodone-acetaminophen (NORCO/VICODIN) 5-325 MG per tablet Take 2 tablets by mouth  every 4 (four) hours as needed for pain. 03/25/13  Yes Orpah Greek, MD  metoprolol succinate (TOPROL-XL) 25 MG 24 hr tablet TAKE 1 TABLET BY MOUTH DAILY. 01/11/14  Yes Wendie Agreste, MD  pravastatin (PRAVACHOL) 80 MG tablet TAKE 1 TABLET BY MOUTH DAILY. 01/11/14  Yes Wendie Agreste, MD   History   Social History  . Marital Status: Married    Spouse Name: [name]    Number of Children: [name]  . Years of Education: [name]   Occupational History  . Not on file.   Social History Main Topics  . Smoking status: Never Smoker   . Smokeless tobacco: Not on file  . Alcohol Use: Yes  . Drug Use: No  . Sexual Activity: Not on file   Other Topics Concern  . Not on file   Social History Narrative  . No narrative on file        Review of Systems  Cardiovascular: Positive for chest pain and palpitations.  Gastrointestinal: Negative for abdominal pain.  Neurological: Positive for dizziness and headaches.  Psychiatric/Behavioral: The patient is nervous/anxious.        Objective:   Physical Exam  Nursing note and vitals reviewed. Constitutional: She is oriented to person, place, and time. She appears well-developed and well-nourished. No distress.  HENT:  Head: Normocephalic and atraumatic.  Eyes: Conjunctivae and EOM are normal. Pupils are equal, round, and reactive to light.  Neck: Neck supple. Carotid bruit is not present. No tracheal deviation present.  Cardiovascular: Normal rate, regular rhythm, normal heart sounds and intact distal pulses.   Pulmonary/Chest: Effort normal and breath sounds normal. No respiratory distress.  Abdominal: Soft. She exhibits no pulsatile midline mass. There is no tenderness.  Musculoskeletal: Normal range of motion.  Neurological: She is alert and oriented to person, place, and time.  Skin: Skin is warm and dry.  Psychiatric: She has a normal mood and affect. Her behavior is normal.    Filed Vitals:   01/26/14 0827  BP: 110/70  Pulse: 67  Temp: 98.2 F (36.8 C)  Resp: 16  Height: 5\' 5"  (1.651 m)  Weight: 202 lb (91.627 kg)  SpO2: 97%      Assessment & Plan:   AEISHA MINARIK is a 58 y.o. female Situational anxiety - Plan: ALPRAZolam (XANAX) 0.25 MG tablet - ok to cont xanax up to tid - discussed prn dosing only, but ph # given to counseling for her to call.  Consider SSRI if persistent frequnt dosing.   HTN (hypertension) - Plan: NIFEdipine (PROCARDIA-XL/ADALAT-CC/NIFEDICAL-XL) 30 MG 24 hr tablet, Ambulatory referral to Cardiology - appears to be at stable dosing with 25mg  metoprolol and 30mg  nifedipine. Cont same doses. Checked home meter in office and readings about 10-20 points higher on her meter.  Notified her of this as reference range. rtc  precautions.  Will arrange for cardiology follow up, but may need prior records from [place] vs.other arrythmia.   Other and unspecified hyperlipidemia - Plan: rosuvastatin (CRESTOR) 20 MG tablet - changed from prava 80 for attempt at better control possible SED, and rtc precautions given.   History of palpitations - Plan: Ambulatory referral to Cardiology as above.   Spanish spoken, understanding expressed.    Meds ordered this encounter  Medications  . NIFEdipine (PROCARDIA-XL/ADALAT-CC/NIFEDICAL-XL) 30 MG 24 hr tablet    Sig: Take 1 tablet (30 mg total) by mouth daily.    Dispense:  30 tablet    Refill:  2  . ALPRAZolam (XANAX) 0.25 MG tablet    Sig: Take 1 tablet (0.25 mg total) by mouth 3 (three) times daily as needed for anxiety.    Dispense:  20 tablet    Refill:  1    Label in spanish  . rosuvastatin (CRESTOR) 20 MG tablet    Sig: Take 1 tablet (20 mg total) by mouth daily.    Dispense:  30 tablet    Refill:  2   Patient Instructions  Tome una pastilla metoprolol 25mg  cada dia, una pastilla nifedipina 30mg  cada dia.  cambio pravastatin a crestor 20mg  cada dia.  llame de telefono terapista para hablar ansiedad: Pryor Montes: [phone]  regrese en 2 semanas, mas temprano si tiene nueva simptomas o peoras.   I personally performed the services described in this documentation, which was scribed in my presence. The recorded information has been reviewed and considered, and addended by me as needed.

## 2014-02-04 ENCOUNTER — Encounter: Payer: Self-pay | Admitting: *Deleted

## 2014-02-05 ENCOUNTER — Institutional Professional Consult (permissible substitution): Payer: BC Managed Care – PPO | Admitting: Cardiology

## 2014-02-24 ENCOUNTER — Other Ambulatory Visit: Payer: Self-pay | Admitting: *Deleted

## 2014-02-26 ENCOUNTER — Institutional Professional Consult (permissible substitution): Payer: BC Managed Care – PPO | Admitting: Cardiology

## 2014-05-31 ENCOUNTER — Ambulatory Visit (INDEPENDENT_AMBULATORY_CARE_PROVIDER_SITE_OTHER): Payer: BC Managed Care – PPO | Admitting: Emergency Medicine

## 2014-05-31 VITALS — BP 124/76 | HR 89 | Temp 98.8°F | Resp 18 | Ht 64.5 in | Wt 200.0 lb

## 2014-05-31 DIAGNOSIS — J029 Acute pharyngitis, unspecified: Secondary | ICD-10-CM

## 2014-05-31 DIAGNOSIS — F411 Generalized anxiety disorder: Secondary | ICD-10-CM

## 2014-05-31 DIAGNOSIS — F418 Other specified anxiety disorders: Secondary | ICD-10-CM

## 2014-05-31 DIAGNOSIS — R6883 Chills (without fever): Secondary | ICD-10-CM

## 2014-05-31 DIAGNOSIS — I10 Essential (primary) hypertension: Secondary | ICD-10-CM

## 2014-05-31 LAB — POCT RAPID STREP A (OFFICE): Rapid Strep A Screen: POSITIVE — AB

## 2014-05-31 MED ORDER — AMOXICILLIN 875 MG PO TABS: 875.0000 mg | ORAL_TABLET | Freq: Two times a day (BID) | ORAL | Status: DC

## 2014-05-31 MED ORDER — ALPRAZOLAM 0.25 MG PO TABS: 0.2500 mg | ORAL_TABLET | Freq: Three times a day (TID) | ORAL | Status: DC | PRN

## 2014-05-31 NOTE — Progress Notes (Signed)
   Subjective:    Patient ID: Emily [name], female    DOB: [birth year], 57 y.o.   MRN: [id number]  HPI 58 year old female pt presents with sore throat, body aches, fever, and chills. She has had the sore throat for 3 days. She feels achy all over her body. Pt also complains of having a headache. Her grandson was recently ill. Pt is also concerned about her BP medication. She states when she takes the medication her BP drops too low so she stops taking it. When she stops taking her BP medication her BP goes up.    Review of Systems  Constitutional: Positive for fever and chills.  HENT: Positive for sore throat.   Neurological: Positive for headaches.       Objective:   Physical Exam patient is alert and cooperative not toxic appearing. TMs are seen there is wax present in both external auditory canals. Tonsils are 1+ and slightly red. There are bilateral tender anterior cervical nodes. Chest is clear to both auscultation and percussion. Results for orders placed in visit on 05/31/14  POCT RAPID STREP A (OFFICE)      Result Value Ref Range   Rapid Strep A Screen Positive (*) Negative   Meds ordered this encounter  Medications  . amoxicillin (AMOXIL) 875 MG tablet    Sig: Take 1 tablet (875 mg total) by mouth 2 (two) times daily.    Dispense:  20 tablet    Refill:  0         Assessment & Plan:  Patient tested positive for strep. Exposure was last week. Will be sure the child they were exposed to get checked. He states that when she takes her procardia extended release 30 her pressure falls too much but if she does not take it her blood pressure goes up. We'll go ahead with cardiology referral. She was not able to go to her last appointment . I did refill her Xanax to take if she gets stressed out. I did not want to start a different blood pressure medication prior to her seeing the cardiologist

## 2014-05-31 NOTE — Patient Instructions (Signed)
Angina por estreptococos  (Strep Throat)   La angina estreptocóccica es una infección en la garganta causada por una bacteria llamada Streptococcus pyogenes. El médico la llamará "amigdalitis" o "faringitis" estreptocóccica, según haya signos de inflamación en las amígdalas o en la zona posterior de la garganta. Es más frecuente en niños entre los 5 y los 15 años durante los meses de frío, pero puede ocurrir a personas de cualquier edad. La infección se transmite de persona a persona (contagio) a través de la tos, el estornudo u otro contacto cercano.   SÍNTOMAS  · Fiebre o escalofríos.  · La garganta o las amígdalas le duelen y están inflamadas.  · Dolor o dificultad para tragar.  · Puntos blancos o amarillos en las amígdalas o la garganta.  · Ganglios linfáticos hinchados a los lados del cuello o debajo de la mandíbula.  · Erupción rojiza en todo el cuerpo (poco común).  DIAGNÓSTICO  Diferentes infecciones pueden causar los mismos síntomas. Para confirmar el diagnóstico deberá hacerse análisis, y así podrán prescribirle el tratamiento adecuado. La "prueba rápida de estreptococo" ayudará al médico a hacer el diagnóstico en algunos minutos. Si no se dispone de la prueba, se hará un rápido hisopado de la zona afectada para hacer un cultivo de las secreciones de la garganta. Si se hace un cultivo, los resultados estarán disponibles en uno o dos días.   TRATAMIENTO  El estreptococo de garganta se trata con antibióticos.  INSTRUCCIONES PARA EL CUIDADO DOMICILIARIO  · Haga gárgaras con 1 cucharadita de sal en 1 taza de agua tibia, 3 ó 4 veces por día o cuando lo crea necesario para sentirse mejor.  · Los miembros de la familia que también tengan dolor en la garganta deben ser evaluados y tratados con antibióticos si tienen la infección.  · Asegúrese de que todas las personas de su casa se laven bien las manos.  · No comparta alimentos, tazas ni utensilios personales que puedan contagiar la infección.  · Coma alimentos  blandos hasta que el dolor de garganta mejore.  · Beba gran cantidad de líquido para mantener la orina de tono claro o color amarillo pálido. Esto ayudará a prevenir la deshidratación.  · Debe hacer reposo.  · No concurra a la escuela o la trabajo hasta que haya tomado los antibióticos durante 24 horas.  · Utilice los medicamentos de venta libre o de prescripción para el dolor, el malestar o la fiebre, según se lo indique el profesional que lo asiste.  · Si le han recetado medicamentos, tómelos según las indicaciones. Finalice la prescripción completa, aunque se sienta mejor.  SOLICITE ATENCIÓN MÉDICA SI:  · Los ganglios del cuello siguen agrandados.  · Aparece una erupción cutánea, tos o dolor de oídos.  · Tiene un catarro verde, amarillo amarronado o esputo sanguinolento.  · Siente dolor o malestar que no puede controlar con los medicamentos.  · Los síntomas parecen empeorar en vez de mejorar.  SOLICITE ATENCIÓN MÉDICA DE INMEDIATO SI:  · Presenta algún nuevo síntoma, como vómitos, dolor de cabeza intenso, rigidez o dolor en el cuello, dolor en el pecho o problemas respiratorios o dificultad para tragar.  · Presenta dolor de garganta intenso, babeo o cambios en la voz.  · Siente que el cuello se hincha o la piel de esa zona se vuelve roja y sensible.  · Tiene fiebre.  · Tiene signos de deshidratación, como fatiga, boca seca y disminución de la orina.  · Comienza a sentir mucho sueño, o no   puede despertarse bien.  Document Released: 09/19/2005 Document Revised: 11/26/2012  [employer]® Patient Information ©2014 [employer].

## 2014-06-23 ENCOUNTER — Other Ambulatory Visit: Payer: Self-pay | Admitting: Family Medicine

## 2014-08-11 ENCOUNTER — Emergency Department (HOSPITAL_COMMUNITY): Payer: BC Managed Care – PPO

## 2014-08-11 ENCOUNTER — Ambulatory Visit (INDEPENDENT_AMBULATORY_CARE_PROVIDER_SITE_OTHER): Payer: BC Managed Care – PPO | Admitting: Family Medicine

## 2014-08-11 ENCOUNTER — Encounter (HOSPITAL_COMMUNITY): Payer: Self-pay | Admitting: Emergency Medicine

## 2014-08-11 ENCOUNTER — Encounter: Payer: Self-pay | Admitting: Family Medicine

## 2014-08-11 ENCOUNTER — Observation Stay (HOSPITAL_COMMUNITY)
Admission: EM | Admit: 2014-08-11 | Discharge: 2014-08-12 | Disposition: A | Discharge status: Home / Self Care | Payer: BC Managed Care – PPO | Attending: Family Medicine | Admitting: Family Medicine

## 2014-08-11 VITALS — BP 142/90 | HR 90 | Temp 98.1°F | Resp 18 | Ht 64.5 in | Wt 206.8 lb

## 2014-08-11 DIAGNOSIS — K219 Gastro-esophageal reflux disease without esophagitis: Secondary | ICD-10-CM | POA: Diagnosis not present

## 2014-08-11 DIAGNOSIS — R0789 Other chest pain: Principal | ICD-10-CM

## 2014-08-11 DIAGNOSIS — R300 Dysuria: Secondary | ICD-10-CM

## 2014-08-11 DIAGNOSIS — M549 Dorsalgia, unspecified: Secondary | ICD-10-CM | POA: Insufficient documentation

## 2014-08-11 DIAGNOSIS — Z79899 Other long term (current) drug therapy: Secondary | ICD-10-CM | POA: Diagnosis not present

## 2014-08-11 DIAGNOSIS — I10 Essential (primary) hypertension: Secondary | ICD-10-CM | POA: Diagnosis not present

## 2014-08-11 DIAGNOSIS — R10816 Epigastric abdominal tenderness: Secondary | ICD-10-CM

## 2014-08-11 DIAGNOSIS — R072 Precordial pain: Secondary | ICD-10-CM

## 2014-08-11 DIAGNOSIS — R079 Chest pain, unspecified: Secondary | ICD-10-CM

## 2014-08-11 DIAGNOSIS — K3189 Other diseases of stomach and duodenum: Secondary | ICD-10-CM

## 2014-08-11 DIAGNOSIS — E7800 Pure hypercholesterolemia, unspecified: Secondary | ICD-10-CM

## 2014-08-11 DIAGNOSIS — E785 Hyperlipidemia, unspecified: Secondary | ICD-10-CM | POA: Diagnosis not present

## 2014-08-11 DIAGNOSIS — Z90710 Acquired absence of both cervix and uterus: Secondary | ICD-10-CM | POA: Insufficient documentation

## 2014-08-11 DIAGNOSIS — R1013 Epigastric pain: Secondary | ICD-10-CM

## 2014-08-11 DIAGNOSIS — R110 Nausea: Secondary | ICD-10-CM | POA: Diagnosis not present

## 2014-08-11 DIAGNOSIS — Z789 Other specified health status: Secondary | ICD-10-CM

## 2014-08-11 LAB — BASIC METABOLIC PANEL
Anion gap: 13 (ref 5–15)
BUN: 9 mg/dL (ref 6–23)
CO2: 26 mEq/L (ref 19–32)
CREATININE: 0.59 mg/dL (ref 0.50–1.10)
Calcium: 9.5 mg/dL (ref 8.4–10.5)
Chloride: 102 mEq/L (ref 96–112)
GFR calc Af Amer: >90 mL/min (ref >90)
GLUCOSE: 93 mg/dL (ref 70–99)
Potassium: 4.3 mEq/L (ref 3.7–5.3)
SODIUM: 141 meq/L (ref 137–147)

## 2014-08-11 LAB — D-DIMER, QUANTITATIVE: D-Dimer, Quant: 0.65 ug/mL-FEU — ABNORMAL HIGH (ref 0.00–0.48)

## 2014-08-11 LAB — CBC
HEMATOCRIT: 37.9 % (ref 36.0–46.0)
Hemoglobin: 12.6 g/dL (ref 12.0–15.0)
MCH: 25.2 pg — ABNORMAL LOW (ref 26.0–34.0)
MCHC: 33.2 g/dL (ref 30.0–36.0)
MCV: 75.8 fL — ABNORMAL LOW (ref 78.0–100.0)
Platelets: 267 10*3/uL (ref 150–400)
RBC: 5 MIL/uL (ref 3.87–5.11)
RDW: 15.3 % (ref 11.5–15.5)
WBC: 7.5 10*3/uL (ref 4.0–10.5)

## 2014-08-11 LAB — POCT URINALYSIS DIPSTICK
Bilirubin, UA: NEGATIVE
Glucose, UA: NEGATIVE
Ketones, UA: NEGATIVE
Nitrite, UA: NEGATIVE
PH UA: 6.5
Protein, UA: NEGATIVE
RBC UA: NEGATIVE
Spec Grav, UA: <=1.005
UROBILINOGEN UA: 0.2

## 2014-08-11 LAB — POCT UA - MICROSCOPIC ONLY
BACTERIA, U MICROSCOPIC: NEGATIVE
CASTS, UR, LPF, POC: NEGATIVE
CRYSTALS, UR, HPF, POC: NEGATIVE
Mucus, UA: NEGATIVE
RBC, urine, microscopic: NEGATIVE
Yeast, UA: NEGATIVE

## 2014-08-11 LAB — I-STAT TROPONIN, ED
Troponin i, poc: 0 ng/mL (ref 0.00–0.08)
Troponin i, poc: 0 ng/mL (ref 0.00–0.08)

## 2014-08-11 MED ORDER — ACETAMINOPHEN 325 MG PO TABS
650.0000 mg | ORAL_TABLET | Freq: Once | ORAL | Status: AC
  Administered 2014-08-11: 650 mg via ORAL
  Filled 2014-08-11: qty 2

## 2014-08-11 MED ORDER — IOHEXOL 350 MG/ML SOLN
100.0000 mL | Freq: Once | INTRAVENOUS | Status: AC | PRN
  Administered 2014-08-11: 100 mL via INTRAVENOUS

## 2014-08-11 NOTE — ED Notes (Signed)
Pt. C/o left sided chest pain with radiation to left arm. Denies N/V, diaphoresis, SOB. Pt. States pain initially started in epigastric region and moved up to chest. Pt. Resting in bed. Alert and oriented x4.

## 2014-08-11 NOTE — ED Provider Notes (Signed)
Medical screening examination/treatment/procedure(s) were conducted as a shared visit with non-physician practitioner(s) and myself.  I personally evaluated the patient during the encounter.   EKG Interpretation None      Date: 08/11/2014  Rate: 72  Rhythm: normal sinus rhythm  QRS Axis: normal  Intervals: normal  ST/T Wave abnormalities: normal  Conduction Disutrbances:none  Narrative Interpretation:   Old EKG Reviewed: none available  Patient here complaining of exertional chest pain and high blood pressure. Seen in urgent care Center and sent here for further evaluation. Elevated d-dimer was noted into the negative CT of the chest. Will be admitted for cardiac workup    Leota Jacobsen, MD 08/11/14 2207

## 2014-08-11 NOTE — Progress Notes (Signed)
Subjective 58 year old lady patient of Dr. Carlota Raspberry who was brought in here by her husband. About a half hour before coming in she was standing in the yard talking and developed substernal pressure chest pain. No radiation to her arm but did have some pain through to her back.. No nausea. No diaphoresis. She took her blood pressure and it was 191/96. She has a history of hypertension. She's been having a lot of edema in her legs. She is on metoprolol and nifedipine. She took an extra metoprolol tartrate and came to the emergency room. She is allergic to aspirin and is not taking aspirin. She has a history of hypercholesterolemia, and has been on a medication for that. She has a cardiology appointment to evaluate her fluctuating blood pressures next month. Patient also complained of dysuria and dark urine. Language barrier-patient speaks some English, mostly Spanish, but husband interpreted well.  Objective: Overweight lady holding her chest. Looks comfortable otherwise. Neck supple without nodes. Chest clear to auscultation. Heart regular without murmurs. Abdomen soft without masses but is tender in the at the epigastrium. She also has some sternal tenderness.  Assessment: Acute pressure chest pain Hypertension Hyperlipidemia Edema Obesity Dysuria Language barrier  Plan: Sent by EMS to ER EKG was done stat. EKG is normal, no change from previous. Patient has sufficient rest factors however that she needs to be sent to the emergency room for enzyme evaluations.  If testing is good, probably should be placed on a PPI.  Avoid aspirin or NSAID as she is allergic.  Spoke with daughter on phon  Results for orders placed in visit on 08/11/14  POCT URINALYSIS DIPSTICK      Result Value Ref Range   Color, UA yellow     Clarity, UA clear     Glucose, UA neg     Bilirubin, UA neg     Ketones, UA neg     Spec Grav, UA <=1.005     Blood, UA neg     pH, UA 6.5     Protein, UA neg     Urobilinogen, UA 0.2     Nitrite, UA neg     Leukocytes, UA Trace    POCT UA - MICROSCOPIC ONLY      Result Value Ref Range   WBC, Ur, HPF, POC 0-2     RBC, urine, microscopic neg     Bacteria, U Microscopic neg     Mucus, UA neg     Epithelial cells, urine per micros 0-2     Crystals, Ur, HPF, POC neg     Casts, Ur, LPF, POC neg     Yeast, UA neg     IV placed.  Patient transported to ER by EMS   45 min direct care

## 2014-08-11 NOTE — ED Notes (Signed)
Per EMS, pt comes from [facility]. Pt. C/o epigastric and midsternal chest pain radiating to back. Pt. Took BP at home 191/104. Taking metoprolol but states "It hasn't been working". No ASA given due to allergy and nitro held for a HA. Also reports leg swelling x1 week after traveling to [place]. Pedal pulses intact bilaterally

## 2014-08-11 NOTE — ED Notes (Signed)
PT monitored by pulse ox, bp cuff, and 12-lead. 

## 2014-08-11 NOTE — ED Notes (Signed)
Called into patient room by family due to patient shaking. Pt. Appears to be shivering. Blankets applied. Family worried that it is the dye used in CT. Pt. Is alert and oriented x4, VSS, otherwise condition has not changed.

## 2014-08-11 NOTE — ED Notes (Addendum)
Updated on plan of care. Family at bedside

## 2014-08-11 NOTE — ED Provider Notes (Signed)
CSN: [id number]     Arrival date & time 08/11/14  1802 History   First MD Initiated Contact with Patient 08/11/14 1809     Chief Complaint  Patient presents with  . Chest Pain     (Consider location/radiation/quality/duration/timing/severity/associated sxs/prior Treatment) HPI Comments: Emily [name] is a 58 y.o. Female with a PMHx of HTN, HLD, and GERD, who presents to the ED complaining of mild intermittent chest pressure in the substernal and L chest, radiating to her L arm (states it feels "heavy"), that began last night at rest and is worse with movement, relieved by certain positions, and associated with nausea and a HA. States she did not try anything for the pain prior to coming here other than taking another dose of metoprolol as well as 2 xanax which didn't help with the pain but helped with her blood pressures. Denies that pain increased or changed with exertion or with inspiration. Endorses feeling "gas" pressure, but denies obstipation or constipation. Denies back pain, diaphoresis, dizziness, fatigue, fever, GERD symptoms, abd pain, LE swelling, syncope, paresthesias, numbness, tingling, palpitations, SOB, vomiting, cough, myalgias or arthralgias. No recent trauma, no hx of DVT/PE, nonsmoker.   Patient is a 58 y.o. female presenting with chest pain. The history is provided by the patient and the spouse. No language interpreter was used.  Chest Pain Pain location:  Substernal area and L chest Pain quality: pressure   Pain radiates to:  L arm Pain radiates to the back: no   Pain severity:  Mild Onset quality:  Gradual Duration:  1 day Timing:  Intermittent Progression:  Unchanged Chronicity:  New Context: movement and at rest   Context: not breathing, not eating and no trauma   Relieved by:  Certain positions Worsened by:  Movement Ineffective treatments:  None tried Associated symptoms: nausea   Associated symptoms: no abdominal pain, no anxiety, no back pain, no  cough, no diaphoresis, no dizziness, no fatigue, no fever, no heartburn, no lower extremity edema, no near-syncope, no numbness, no orthopnea, no palpitations, no shortness of breath, no syncope, not vomiting and no weakness   Risk factors: hypertension and obesity   Risk factors: no prior DVT/PE, no smoking and no surgery     Past Medical History  Diagnosis Date  . Hypertension   . Hyperlipidemia   . Back pain   . GERD (gastroesophageal reflux disease)    Past Surgical History  Procedure Laterality Date  . Abdominal hysterectomy     Family History  Problem Relation Age of Onset  . Aneurysm Mother   . Diabetes Father    History  Substance Use Topics  . Smoking status: Never Smoker   . Smokeless tobacco: Not on file  . Alcohol Use: Yes   OB History   Grav Para Term Preterm Abortions TAB SAB Ect Mult Living                 Review of Systems  Constitutional: Negative for fever, diaphoresis and fatigue.  HENT: Negative for congestion.   Eyes: Negative for visual disturbance.  Respiratory: Positive for chest tightness. Negative for cough, shortness of breath and wheezing.   Cardiovascular: Positive for chest pain. Negative for palpitations, orthopnea, syncope and near-syncope.  Gastrointestinal: Positive for nausea. Negative for heartburn, vomiting, abdominal pain, diarrhea, constipation and abdominal distention.  Genitourinary: Negative for dysuria and hematuria.  Musculoskeletal: Negative for arthralgias, back pain, myalgias and neck pain.  Skin: Negative for color change.  Neurological: Negative for dizziness, syncope,  weakness, light-headedness and numbness.  Psychiatric/Behavioral: Negative for confusion.  10 Systems reviewed and are negative for acute change except as noted in the HPI.     Allergies  Aspirin and Nsaids  Home Medications   Prior to Admission medications   Medication Sig Start Date End Date Taking? Authorizing Provider  metoprolol succinate  (TOPROL-XL) 25 MG 24 hr tablet Take 25 mg by mouth every morning.    Yes Historical Provider, MD  NIFEdipine (PROCARDIA-XL/ADALAT-CC/NIFEDICAL-XL) 30 MG 24 hr tablet Take 1 tablet (30 mg total) by mouth daily. 01/26/14  Yes Wendie Agreste, MD  rosuvastatin (CRESTOR) 20 MG tablet Take 20 mg by mouth at bedtime.    Yes Historical Provider, MD   BP 142/90  Pulse 90  Resp 18  SpO2 95%  Temp 98.1 F (oral)  Physical Exam  Nursing note and vitals reviewed. Constitutional: She is oriented to person, place, and time. Vital signs are normal. She appears well-developed and well-nourished.  Non-toxic appearance. No distress.  VSS, NAD, afebrile  HENT:  Head: Normocephalic and atraumatic.  Nose: Nose normal.  Mouth/Throat: Uvula is midline, oropharynx is clear and moist and mucous membranes are normal.  Eyes: Conjunctivae and EOM are normal. Pupils are equal, round, and reactive to light. Right eye exhibits no discharge. Left eye exhibits no discharge.  Neck: Normal range of motion. Neck supple. No JVD present. Carotid bruit is not present.  Cardiovascular: Normal rate, regular rhythm, normal heart sounds and intact distal pulses.  Exam reveals no gallop and no friction rub.   No murmur heard. RRR, nl s1/s2, no m/r/g, distal pulses equal in all extremities  Pulmonary/Chest: Effort normal and breath sounds normal. No respiratory distress. She has no decreased breath sounds. She has no wheezes. She has no rhonchi. She has no rales. She exhibits tenderness. She exhibits no crepitus, no deformity, no swelling and no retraction.  CTAB in all lung fields. Chest wall TTP in areas where pain was reported, no crepitus or retraction or subQ air  Abdominal: Soft. Normal appearance and bowel sounds are normal. She exhibits no distension. There is no tenderness. There is no rigidity, no rebound, no guarding and no CVA tenderness.  Musculoskeletal: Normal range of motion.  Moving all extremities with ease, strength  5/5 in all extremities, sensation grossly intact in all extremities. Distal pulses intact in all extremities  Neurological: She is alert and oriented to person, place, and time. She has normal strength. No cranial nerve deficit or sensory deficit.  Strength 5/5 in all extremities, sensation grossly intact in all extremities, CN2-12 grossly intact  Skin: Skin is warm, dry and intact. No rash noted.  No erythema or bruising over exposed surfaces  Psychiatric: She has a normal mood and affect.    ED Course  Procedures (including critical care time) Labs Review Labs Reviewed  CBC - Abnormal; Notable for the following:    MCV 75.8 (*)    MCH 25.2 (*)    All other components within normal limits  D-DIMER, QUANTITATIVE - Abnormal; Notable for the following:    D-Dimer, Quant 0.65 (*)    All other components within normal limits  BASIC METABOLIC PANEL  TSH  LIPID PANEL  HEMOGLOBIN A1C  PRO B NATRIURETIC PEPTIDE  TROPONIN I  TROPONIN I  TROPONIN I  I-STAT TROPOININ, ED  Randolm Idol, ED    Imaging Review Dg Chest 2 View  08/11/2014   CLINICAL DATA:  Mid left chest pain for 2 days extending into  the left arm. History of hypertension.  EXAM: CHEST  2 VIEW  COMPARISON:  None.  FINDINGS: The heart is mildly enlarged. The mediastinal contours are normal. There are low lung volumes with linear atelectasis at both lung bases. There is no confluent airspace opacity, edema or pleural effusion. No acute osseous findings are evident.  IMPRESSION: Cardiomegaly with subsegmental atelectasis at both lung bases. No acute cardiopulmonary process.   Electronically Signed   By: Camie Patience M.D.   On: 08/11/2014 19:58   Ct Angio Chest Pe W/cm &/or Wo Cm  08/11/2014   CLINICAL DATA:  Chest pain with left arm weakness and abdominal pain for 2 days. Elevated D-dimer levels. Question acute pulmonary embolism.  EXAM: CT ANGIOGRAPHY CHEST WITH CONTRAST  TECHNIQUE: Multidetector CT imaging of the chest was  performed using the standard protocol during bolus administration of intravenous contrast. Multiplanar CT image reconstructions and MIPs were obtained to evaluate the vascular anatomy.  CONTRAST:  175mL OMNIPAQUE IOHEXOL 350 MG/ML SOLN  COMPARISON:  Chest radiographs 08/11/2014.  Abdominal CT 07/01/2005.  FINDINGS: Vascular: The pulmonary arteries are well opacified with contrast. There is no evidence of acute pulmonary embolism. No significant atherosclerosis is demonstrated.  Mediastinum: There are no enlarged mediastinal, hilar or axillary lymph nodes. The thyroid gland and trachea appear normal. There is a small hiatal hernia. The heart size is normal.  Lungs/Pleura: There is a small amount of fluid within the superior pericardial recess. There is no pleural effusion. Patchy dependent ground-glass opacities in both lungs are most consistent with atelectasis. There is no confluent airspace opacity or suspicious pulmonary nodule.  Upper abdomen: Unremarkable.  There is no adrenal mass.  Musculoskeletal/Chest wall: No chest wall lesion or acute osseous findings.  Review of the MIP images confirms the above findings.  IMPRESSION: No evidence of acute pulmonary embolism or other acute chest process. Small hiatal hernia.   Electronically Signed   By: Camie Patience M.D.   On: 08/11/2014 20:58     EKG Interpretation None      MDM   Final diagnoses:  Other chest pain    57y/o female with atypical reproducible CP. BMP WNL, CBC WNL, trop #1 neg, EKG unremarkable, Ddimer elevated at 0.65 therefore CTA obtained which was negative. Tylenol given with relief of symptoms but they returned after approx 3 hrs. Repeat Trop ordered but consulted Fam Med for admission for ACS r/o given that CP had gone away and then returned with exertion going to the restroom during ED stay. Pt denied administration of morphine several times worried that it would cause a reaction. NTG held initially for BPs, and ASA held due to  allergy. Please see Dr. Ayesha Rumpf dictation regarding documentation of admission.  BP 108/68  Pulse 72  Temp(Src) 98.1 F (36.7 C) (Oral)  Resp 18  Ht 5' 6.93" (1.7 m)  Wt 212 lb 9.6 oz (96.435 kg)  BMI 33.37 kg/m2  SpO2 93%  Meds ordered this encounter  Medications  . acetaminophen (TYLENOL) tablet 650 mg    Sig:        Emily [name], [place] 08/12/14 [phone]

## 2014-08-11 NOTE — ED Notes (Signed)
Pt. States pain to left chest is better, but still having HA

## 2014-08-11 NOTE — H&P (Signed)
[facility] Admission History and Physical Service Pager: [phone]  Patient name: Emily [name] [facility] Medical record number: [id number] Date of birth: 1956/02/01 Age: 58 y.o. Gender: female  Primary Care Provider: No PCP Per Patient Consultants: None Code Status: Full  Chief Complaint: Chest Pain, Shortness of Breath, Hypertension.   Assessment and Plan: KNIYAH KHUN is a 58 y.o. female presenting with acute onset substernal and epigastric chest pain, R arm pain, with SOB in the setting of lower extremity edema x 2 weeks. PMH is significant for HTN, HLD, Obesity, Anxiety.   1. Chest pain, Rule out ACS: Pt. Presenting with new onset chest pain, R arm pain, SOB, and elevated BP's 181/105 initially in the ED in the setting of subjective worsening lower extremity edema x 2 weeks. EKG NSR without acute changes and only evidence of potential left atrial enlargement. Troponin negative x 2, Elevated D-Dimer with subsequent CT chest negative for acute PE.  - Admit to telemetry for observation - Trend troponins. At this time negative x 2 - TSH, HgbA1c in the am - EKG in the am - Echo in the am given new onset lower extremity edema vs. Outpatient echo.  - Morphine prn for chest pain - Sublingual Nitroglycerin prn. No aspirin given Allergy - HEART score 3, ASCVD 10-year 2.6% risk.  likely home tomorrow if rest of work up normal.   - Patient has future appointment with cardiology (Dr. Meda Coffee on 8/25)  2. Hypertension: Blood pressure elevated to 181/105 initially in the ED. Subsequent return to baseline.  - BP's currently stable 120's - 130's / 60's - 70's - Continue home metoprolol 25mg  qd.  - Holding home Procardia at this time. - Will add additional medications if need to control BP.   3. Hyperlipidemia - last lipid panel 12/2013 Cholesterol of 222.  ASCVD 10-year risk 2.6%.  - Lipitor 40 mg (substitued for home Crestor) - current lipid panel pending.     FEN/GI: NPO after midnight/ No GI ppx at this time.  Prophylaxis: Lovenox  Disposition: Will follow up am labs / EKG. Likely home tomorrow with outpatient cardiology follow up as previously scheduled if the rest of her testing is unremarkable  History of Present Illness: Emily [name] is a 58 y.o. female presenting with new onset substernal / epigastric chest pain around 4:30pm today described as tightness and subsequently radiating to her R arm upon arrival in the ED. Her chest pain occurred while standing in the kitchen cooking per the patient, and she experienced SOB at that time. She did complain of mild nausea, however denies vomiting. The chest pain has been intermittent since that time, and since her arrival in the ED. She says that it is 5/10 in severity, and that she has never had this before. Additionally, she says that she has had subjective lower extremity swelling / edema x 2 weeks prior to this episode. She denies SOB with exertion at home, or orthopnea. She has been on Metoprolol and Nifedipine for her HTN. She denies history of smoking, or DM. She does not have a family history of coronary artery disease.   Review Of Systems: Per HPI with the following additions: None Otherwise 12 point review of systems was performed and was unremarkable.  Patient Active Problem List   Diagnosis Date Noted  . HTN (hypertension) 10/03/2012  . Hyperlipidemia 10/03/2012  . DYSPEPSIA&OTHER [facility] DISORDERS FUNCTION STOMACH 07/20/2009   Past Medical History: Past Medical History  Diagnosis Date  .  Hypertension   . Hyperlipidemia   . Back pain   . GERD (gastroesophageal reflux disease)    Past Surgical History: Past Surgical History  Procedure Laterality Date  . Abdominal hysterectomy     Social History: History  Substance Use Topics  . Smoking status: Never Smoker   . Smokeless tobacco: Not on file  . Alcohol Use: Yes   Additional social history: None Please also refer to  relevant sections of EMR.  Family History: Family History  Problem Relation Age of Onset  . Aneurysm Mother   . Diabetes Father    Allergies and Medications: Allergies  Allergen Reactions  . Aspirin Hives  . Nsaids Hives   No current facility-administered medications on file prior to encounter.   Current Outpatient Prescriptions on File Prior to Encounter  Medication Sig Dispense Refill  . NIFEdipine (PROCARDIA-XL/ADALAT-CC/NIFEDICAL-XL) 30 MG 24 hr tablet Take 1 tablet (30 mg total) by mouth daily.  30 tablet  2    Objective: BP 135/75  Pulse 70  Resp 13  SpO2 99% Exam: General: NAD, AAOx3 HEENT: NCAT, PERRLA Cardiovascular: RRR, No murmurs, gallops, or rubs auscultated Respiratory: Mild crackles heard in bilateral lung bases, Upper lung fields clear to auscultation bilaterally. Good effort, non-labored.  Abdomen: Soft, Nontender, nondistended, +BS Extremities: WWP, 2+ distal pulses, Trace lower extremity edema.  Skin: Warm, Dry, No erythema, rashes, or other abnormalities noted.  Neuro: AAOx3, CNII - XII grossly intact, Moving all extremities well.   Labs and Imaging: CBC BMET   Recent Labs Lab 08/11/14 1853  WBC 7.5  HGB 12.6  HCT 37.9  PLT 267    Recent Labs Lab 08/11/14 1853  NA 141  K 4.3  CL 102  CO2 26  BUN 9  CREATININE 0.59  GLUCOSE 93  CALCIUM 9.5     Troponin I - 0.00 x 2 D-Dimer - 0.65  Urinalysis - WNL  EKG 8/19 - Sinus rhythm with biphasic p-waves in V1 suggestive of left atrial enlargement. Similar to EKG 01/02/2014.   CXR 8/19 -  EXAM:  CHEST 2 VIEW  COMPARISON: None.  FINDINGS:  The heart is mildly enlarged. The mediastinal contours are normal.  There are low lung volumes with linear atelectasis at both lung  bases. There is no confluent airspace opacity, edema or pleural  effusion. No acute osseous findings are evident.  IMPRESSION:  Cardiomegaly with subsegmental atelectasis at both lung bases. No  acute cardiopulmonary  process.  Electronically Signed  By: Camie Patience M.D.  On: 08/11/2014 19:58  CTA Chest 8/19 -  EXAM:  CT ANGIOGRAPHY CHEST WITH CONTRAST  TECHNIQUE:  Multidetector CT imaging of the chest was performed using the  standard protocol during bolus administration of intravenous  contrast. Multiplanar CT image reconstructions and MIPs were  obtained to evaluate the vascular anatomy.  CONTRAST: 168mL OMNIPAQUE IOHEXOL 350 MG/ML SOLN  COMPARISON: Chest radiographs 08/11/2014. Abdominal CT 07/01/2005.  FINDINGS:  Vascular: The pulmonary arteries are well opacified with contrast.  There is no evidence of acute pulmonary embolism. No significant  atherosclerosis is demonstrated.  Mediastinum: There are no enlarged mediastinal, hilar or axillary  lymph nodes. The thyroid gland and trachea appear normal. There is a  small hiatal hernia. The heart size is normal.  Lungs/Pleura: There is a small amount of fluid within the superior  pericardial recess. There is no pleural effusion. Patchy dependent  ground-glass opacities in both lungs are most consistent with  atelectasis. There is no confluent airspace opacity or  suspicious  pulmonary nodule.  Upper abdomen: Unremarkable. There is no adrenal mass.  Musculoskeletal/Chest wall: No chest wall lesion or acute osseous  findings.  Review of the MIP images confirms the above findings.  IMPRESSION:  No evidence of acute pulmonary embolism or other acute chest  process. Small hiatal hernia.  Electronically Signed  By: Camie Patience M.D.  On: 08/11/2014 20:58   Aquilla Hacker, MD 08/11/2014, 11:30 PM PGY-1, [place] Intern pager: [phone], text pages welcome  I have seen and evaluated the above patient.  Addendum in blue.  [place] PGY-3

## 2014-08-12 DIAGNOSIS — R0789 Other chest pain: Principal | ICD-10-CM

## 2014-08-12 DIAGNOSIS — R072 Precordial pain: Secondary | ICD-10-CM

## 2014-08-12 DIAGNOSIS — R1013 Epigastric pain: Secondary | ICD-10-CM

## 2014-08-12 DIAGNOSIS — I369 Nonrheumatic tricuspid valve disorder, unspecified: Secondary | ICD-10-CM

## 2014-08-12 DIAGNOSIS — K3189 Other diseases of stomach and duodenum: Secondary | ICD-10-CM

## 2014-08-12 DIAGNOSIS — I10 Essential (primary) hypertension: Secondary | ICD-10-CM | POA: Diagnosis not present

## 2014-08-12 DIAGNOSIS — E785 Hyperlipidemia, unspecified: Secondary | ICD-10-CM | POA: Diagnosis not present

## 2014-08-12 LAB — TSH: TSH: 2 u[IU]/mL (ref 0.350–4.500)

## 2014-08-12 LAB — LIPID PANEL
Cholesterol: 179 mg/dL (ref 0–200)
HDL: 46 mg/dL (ref >39)
LDL Cholesterol: 109 mg/dL — ABNORMAL HIGH (ref 0–99)
TRIGLYCERIDES: 120 mg/dL (ref <150)
Total CHOL/HDL Ratio: 3.9 RATIO
VLDL: 24 mg/dL (ref 0–40)

## 2014-08-12 LAB — HEMOGLOBIN A1C
Hgb A1c MFr Bld: 6.1 % — ABNORMAL HIGH (ref <5.7)
Mean Plasma Glucose: 128 mg/dL — ABNORMAL HIGH (ref <117)

## 2014-08-12 LAB — PRO B NATRIURETIC PEPTIDE: Pro B Natriuretic peptide (BNP): 254.6 pg/mL — ABNORMAL HIGH (ref 0–125)

## 2014-08-12 LAB — TROPONIN I: Troponin I: <0.3 ng/mL (ref <0.30)

## 2014-08-12 MED ORDER — GI COCKTAIL ~~LOC~~: 30.0000 mL | Freq: Four times a day (QID) | ORAL | Status: DC | PRN

## 2014-08-12 MED ORDER — DEXTROSE 50 % IV SOLN: 1.0000 | Freq: Once | INTRAVENOUS | Status: DC

## 2014-08-12 MED ORDER — NITROGLYCERIN 0.4 MG SL SUBL: 0.4000 mg | SUBLINGUAL_TABLET | SUBLINGUAL | Status: DC | PRN

## 2014-08-12 MED ORDER — ONDANSETRON HCL 4 MG/2ML IJ SOLN: 4.0000 mg | Freq: Four times a day (QID) | INTRAMUSCULAR | Status: DC | PRN

## 2014-08-12 MED ORDER — ENOXAPARIN SODIUM 40 MG/0.4ML ~~LOC~~ SOLN
40.0000 mg | SUBCUTANEOUS | Status: DC
  Administered 2014-08-12: 40 mg via SUBCUTANEOUS
  Filled 2014-08-12: qty 0.4

## 2014-08-12 MED ORDER — ACETAMINOPHEN 325 MG PO TABS
650.0000 mg | ORAL_TABLET | ORAL | Status: DC | PRN
  Administered 2014-08-12: 650 mg via ORAL
  Filled 2014-08-12: qty 2

## 2014-08-12 MED ORDER — ATORVASTATIN CALCIUM 40 MG PO TABS
40.0000 mg | ORAL_TABLET | Freq: Every day | ORAL | Status: DC
  Administered 2014-08-12: 40 mg via ORAL
  Filled 2014-08-12: qty 1

## 2014-08-12 MED ORDER — MORPHINE SULFATE 2 MG/ML IJ SOLN
2.0000 mg | INTRAMUSCULAR | Status: DC | PRN
  Filled 2014-08-12: qty 1

## 2014-08-12 MED ORDER — METOPROLOL SUCCINATE ER 25 MG PO TB24
25.0000 mg | ORAL_TABLET | Freq: Every morning | ORAL | Status: DC
  Administered 2014-08-12: 25 mg via ORAL
  Filled 2014-08-12: qty 1

## 2014-08-12 NOTE — H&P (Signed)
FMTS Attending Admission Note: Emily Sabal MD Personal pager:  [phone] FPTS Service Pager:  [phone]  I  have seen and examined this patient, reviewed their chart. I have discussed this patient with the resident. I agree with the resident's findings, assessment and care plan.   Alveda Reasons, MD 08/12/2014 4:20 PM

## 2014-08-12 NOTE — Progress Notes (Signed)
  Echocardiogram 2D Echocardiogram has been performed.  Darlina Sicilian M 08/12/2014, 2:09 PM

## 2014-08-12 NOTE — Progress Notes (Signed)
UR completed 

## 2014-08-12 NOTE — Discharge Instructions (Signed)
You have been admitted for chest pain to rule out a heart attack.   You have not had a heart attack based on your work up at this hospital admission.   You will need to follow up with your Cardiologist Dr. Meda Coffee as you have a previously scheduled appointment.   You will need to follow up with your primary care physician Dr. Carlota Raspberry for hospital follow up. Make an appointment for one week from your discharge date.   We have continued all of your home blood pressure medicines.   If you have any recurrence of chest pain, or any other symptom or concern, then please return to be evaluated in the ED.   Paula Compton MD Family Medicine PGY 1

## 2014-08-12 NOTE — ED Provider Notes (Signed)
Medical screening examination/treatment/procedure(s) were conducted as a shared visit with non-physician practitioner(s) and myself.  I personally evaluated the patient during the encounter.   EKG Interpretation None       Leota Jacobsen, MD 08/12/14 2248

## 2014-08-12 NOTE — Progress Notes (Signed)
Family Medicine Teaching Service Daily Progress Note Intern Pager: [phone]  Patient name: [name] Medical record number: [id number] Date of birth: [birth year] Age: 58 y.o. Gender: female  Primary Care Provider: No PCP Per Patient Consultants: None Code Status: Full  Pt Overview and Major Events to Date:  - 08/12/2014 Pt. Admitted for chest pain and rule out ACS.   Assessment and Plan: Emily [name] is a 57 y.o. female presenting with acute onset substernal and epigastric chest pain, R arm pain, with SOB in the setting of lower extremity edema x 2 weeks. PMH is significant for HTN, HLD, Obesity, Anxiety.   1. Chest pain, Rule out ACS: Pt. Presenting with new onset chest pain, R arm pain, SOB, and elevated BP's 181/105 initially in the ED in the setting of subjective worsening lower extremity edema x 2 weeks. EKG NSR without acute changes and only evidence of potential left atrial enlargement. Troponin negative x 2, Elevated D-Dimer with subsequent CT chest negative for acute PE. ACS ruled out at this point.  - Admitted to telemetry for observation, home today.  - Trend troponins. Negative x 4 - TSH WNL, HgbA1c pending.   - EKG am pending.  - Echo as an outpatient given new onset lower extremity edema. - Morphine prn for chest pain  - Sublingual Nitroglycerin prn. No aspirin given Allergy  - HEART score 3, ASCVD 10-year 2.6% risk. likely home tomorrow if rest of work up normal.  - Patient has future appointment with cardiology (Dr. Meda Coffee on 8/25)   2. Hypertension: Blood pressure elevated to 181/105 initially in the ED. Subsequent return to baseline.  - BP's currently stable low 100's / 60's - 70's - Continue home metoprolol 25mg  qd.  - Blood pressure controlled at this time.  - Holding home Procardia at this time.   3. Hyperlipidemia - last lipid panel 12/2013 Cholesterol of 222. ASCVD 10-year risk 2.6%.  - Lipitor 40 mg (substitued for home Crestor)  - current  lipid panel with mildly elevated LDL at 109, otherwise Normal with statin therapy.   FEN/GI: Heart healthy diet, No GI ppx at this time.  Prophylaxis: Lovenox sub-q   Disposition: Home today with outpatient cards follow up, and pcp follow up for hypertension.   Subjective:  No complaints this am. Chest pain resolved, no further shortness of breath, nausea, or arm pain. She denies headache this am as well. She slept well last night, and is agreeable to discharge this afternoon.   Objective: Temp:  [97.8 F (36.6 C)-98.1 F (36.7 C)] 97.8 F (36.6 C) (08/20 0534) Pulse Rate:  [60-90] 66 (08/20 0534) Resp:  [10-19] 18 (08/20 0534) BP: (97-142)/(62-90) 97/64 mmHg (08/20 0534) SpO2:  [93 %-100 %] 93 % (08/20 0534) Weight:  [206 lb 12.8 oz (93.804 kg)-212 lb 9.6 oz (96.435 kg)] 212 lb 9.6 oz (96.435 kg) (08/20 0045) Physical Exam: General: NAD, AAox3, resting quietly upon entrance today.  Cardiovascular: RRR, No MGR Respiratory: Mild crackles in bilateral bases, upper lung fields clear to auscultation bilaterally, No increased WOB, Comfortable, rate regular.  Abdomen: Nontender, nondistended, Soft, +BS Extremities: WWP, 2+ distal pulses, trace lower extremity edema.   Laboratory:  Recent Labs Lab 08/11/14 1853  WBC 7.5  HGB 12.6  HCT 37.9  PLT 267    Recent Labs Lab 08/11/14 1853  NA 141  K 4.3  CL 102  CO2 26  BUN 9  CREATININE 0.59  CALCIUM 9.5  GLUCOSE 93   Lipid Panel  Component Value Date/Time   CHOL 179 08/12/2014 0156   TRIG 120 08/12/2014 0156   HDL 46 08/12/2014 0156   CHOLHDL 3.9 08/12/2014 0156   VLDL 24 08/12/2014 0156   LDLCALC 109* 08/12/2014 0156      Troponin I - 0.00 x 4 D-Dimer - 0.65  Urinalysis - WNL  TSH - 2.0  EKG 8/19 - Sinus rhythm with biphasic p-waves in V1 suggestive of left atrial enlargement. Similar to EKG 01/02/2014.  EKG 8/20 - pending.    CXR 8/19 -  EXAM:  CHEST 2 VIEW  COMPARISON: None.  FINDINGS:  The heart is  mildly enlarged. The mediastinal contours are normal.  There are low lung volumes with linear atelectasis at both lung  bases. There is no confluent airspace opacity, edema or pleural  effusion. No acute osseous findings are evident.  IMPRESSION:  Cardiomegaly with subsegmental atelectasis at both lung bases. No  acute cardiopulmonary process.  Electronically Signed  By: Camie Patience M.D.  On: 08/11/2014 19:58   CTA Chest 8/19 -  EXAM:  CT ANGIOGRAPHY CHEST WITH CONTRAST  TECHNIQUE:  Multidetector CT imaging of the chest was performed using the  standard protocol during bolus administration of intravenous  contrast. Multiplanar CT image reconstructions and MIPs were  obtained to evaluate the vascular anatomy.  CONTRAST: 158mL OMNIPAQUE IOHEXOL 350 MG/ML SOLN  COMPARISON: Chest radiographs 08/11/2014. Abdominal CT 07/01/2005.  FINDINGS:  Vascular: The pulmonary arteries are well opacified with contrast.  There is no evidence of acute pulmonary embolism. No significant  atherosclerosis is demonstrated.  Mediastinum: There are no enlarged mediastinal, hilar or axillary  lymph nodes. The thyroid gland and trachea appear normal. There is a  small hiatal hernia. The heart size is normal.  Lungs/Pleura: There is a small amount of fluid within the superior  pericardial recess. There is no pleural effusion. Patchy dependent  ground-glass opacities in both lungs are most consistent with  atelectasis. There is no confluent airspace opacity or suspicious  pulmonary nodule.  Upper abdomen: Unremarkable. There is no adrenal mass.  Musculoskeletal/Chest wall: No chest wall lesion or acute osseous  findings.  Review of the MIP images confirms the above findings.  IMPRESSION:  No evidence of acute pulmonary embolism or other acute chest  process. Small hiatal hernia.  Electronically Signed  By: Camie Patience M.D.  On: 08/11/2014 20:58    Aquilla Hacker, MD 08/12/2014, 6:09 AM PGY-1, [place] Intern pager: [phone], text pages welcome

## 2014-08-13 ENCOUNTER — Telehealth: Payer: Self-pay | Admitting: Family Medicine

## 2014-08-13 NOTE — Telephone Encounter (Signed)
Patient's son dropped of FMLA ppw to be completed for him because he will be the care taker of his mother. Papers dropped off on 08/12/2014 and placed in Dr. Clayborn Heron box on 08/13/2014. 5-7 business days for completion.

## 2014-08-15 ENCOUNTER — Other Ambulatory Visit: Payer: Self-pay | Admitting: Family Medicine

## 2014-08-17 ENCOUNTER — Ambulatory Visit (INDEPENDENT_AMBULATORY_CARE_PROVIDER_SITE_OTHER): Payer: BC Managed Care – PPO | Admitting: Cardiology

## 2014-08-17 ENCOUNTER — Encounter: Payer: Self-pay | Admitting: Cardiology

## 2014-08-17 ENCOUNTER — Other Ambulatory Visit: Payer: Self-pay

## 2014-08-17 VITALS — BP 118/64 | HR 76 | Ht 66.0 in | Wt 210.0 lb

## 2014-08-17 DIAGNOSIS — R079 Chest pain, unspecified: Secondary | ICD-10-CM

## 2014-08-17 MED ORDER — NIFEDIPINE ER OSMOTIC RELEASE 30 MG PO TB24: ORAL_TABLET | ORAL | Status: DC

## 2014-08-17 NOTE — Addendum Note (Signed)
Addended by: Nuala Alpha on: 08/17/2014 11:30 AM   Modules accepted: Orders

## 2014-08-17 NOTE — Patient Instructions (Signed)
Your physician recommends that you continue on your current medications as directed. Please refer to the Current Medication list given to you today.  Your physician has requested that you have en exercise stress myoview. For further information please visit [email or web address]. Please follow instruction sheet, as given.   Your physician recommends that you schedule a follow-up appointment in: [place] IS COMPLETE

## 2014-08-17 NOTE — Progress Notes (Signed)
Patient ID: [name], female   DOB: 10-14-1956, 58 y.o.   MRN: [id number]     Patient Name: Emily [name] Date of Encounter: 08/17/2014  Primary Care Provider:  No PCP Per Patient Primary Cardiologist:  Dorothy Spark   Problem List   Past Medical History  Diagnosis Date  . Hypertension   . Hyperlipidemia   . Back pain   . GERD (gastroesophageal reflux disease)    Past Surgical History  Procedure Laterality Date  . Abdominal hysterectomy      Allergies  Allergies  Allergen Reactions  . Aspirin Hives  . Nsaids Hives    HPI  58 year old female originally from [place] who is coming with complaints of chest pain. The patient has history of hypertension, hyperlipidemia has been experiencing chest pain when she lays down and also chest pain associated with high blood pressure. She went to the ER on 08/11/2014 her initial blood pressure was 181/105. She had worsening lower extremity edema at the time she was ruled out for myocardial infarction and CT chest was negative for acute pulmonary embolism. EKG shows only nonspecific T wave abnormalities. She has never smoked and has no family history of coronary artery disease. She has been on Prevacid for gastric reflux for the last 5 years. She states that her nifedipine Adalat has been changed to nifedipine XL and her symptoms started at that time. She underwent echocardiography with findings of mild LVH , impaired relaxation otherwise normal echocardiogram.  Home Medications  Prior to Admission medications   Medication Sig Start Date End Date Taking? Authorizing Provider  ALPRAZolam Duanne Moron) 0.25 MG tablet Take 1 tablet by mouth 3 (three) times daily as needed for anxiety.  08/09/14  Yes Historical Provider, MD  metoprolol succinate (TOPROL-XL) 25 MG 24 hr tablet Take 25 mg by mouth every morning.    Yes Historical Provider, MD  NIFEDICAL XL 30 MG 24 hr tablet TAKE 1 TABLET BY MOUTH DAILY 08/16/14  Yes Posey Boyer, MD  rosuvastatin (CRESTOR) 20 MG tablet Take 20 mg by mouth at bedtime.    Yes Historical Provider, MD    Family History  Family History  Problem Relation Age of Onset  . Aneurysm Mother   . Diabetes Father     Social History  History   Social History  . Marital Status: Married    Spouse Name: [name]    Number of Children: [name]  . Years of Education: [name]   Occupational History  . Not on file.   Social History Main Topics  . Smoking status: Never Smoker   . Smokeless tobacco: Not on file  . Alcohol Use: Yes  . Drug Use: No  . Sexual Activity: Not on file   Other Topics Concern  . Not on file   Social History Narrative  . No narrative on file     Review of Systems, as per HPI, otherwise negative General:  No chills, fever, night sweats or weight changes.  Cardiovascular:  No chest pain, dyspnea on exertion, edema, orthopnea, palpitations, paroxysmal nocturnal dyspnea. Dermatological: No rash, lesions/masses Respiratory: No cough, dyspnea Urologic: No hematuria, dysuria Abdominal:   No nausea, vomiting, diarrhea, bright red blood per rectum, melena, or hematemesis Neurologic:  No visual changes, wkns, changes in mental status. All other systems reviewed and are otherwise negative except as noted above.  Physical Exam  Blood pressure 118/64, pulse 76, height 5\' 6"  (1.676 m), weight 210 lb (95.255 kg).  General: Pleasant, NAD  Psych: Normal affect. Neuro: Alert and oriented X 3. Moves all extremities spontaneously. HEENT: Normal  Neck: Supple without bruits or JVD. Lungs:  Resp regular and unlabored, CTA. Heart: RRR no s3, s4, or murmurs. Abdomen: Soft, non-tender, non-distended, BS + x 4.  Extremities: No clubbing, cyanosis or edema. DP/PT/Radials 2+ and equal bilaterally.  Labs:  No results found for this basename: CKTOTAL, CKMB, TROPONINI,  in the last 72 hours Lab Results  Component Value Date   WBC 7.5 08/11/2014   HGB 12.6 08/11/2014   HCT 37.9  08/11/2014   MCV 75.8* 08/11/2014   PLT 267 08/11/2014    Lab Results  Component Value Date   DDIMER 0.65* 08/11/2014   No components found with this basename: POCBNP,     Component Value Date/Time   NA 141 08/11/2014 1853   K 4.3 08/11/2014 1853   CL 102 08/11/2014 1853   CO2 26 08/11/2014 1853   GLUCOSE 93 08/11/2014 1853   BUN 9 08/11/2014 1853   CREATININE 0.59 08/11/2014 1853   CREATININE 0.69 01/11/2014 2131   CALCIUM 9.5 08/11/2014 1853   PROT 7.4 01/11/2014 2131   ALBUMIN 4.2 01/11/2014 2131   AST 18 01/11/2014 2131   ALT 15 01/11/2014 2131   ALKPHOS 135* 01/11/2014 2131   BILITOT 0.3 01/11/2014 2131   GFRNONAA >90 08/11/2014 1853   GFRAA >90 08/11/2014 1853   Lab Results  Component Value Date   CHOL 179 08/12/2014   HDL 46 08/12/2014   LDLCALC 109* 08/12/2014   TRIG 120 08/12/2014   Accessory Clinical Findings  Echocardiogram - 08/12/2014 Left ventricle: The cavity size was normal. Wall thickness was increased in a pattern of mild LVH. Systolic function was normal. The estimated ejection fraction was in the range of 55% to 60%. Doppler parameters are consistent with abnormal left ventricular relaxation (grade 1 diastolic dysfunction).  ECG - SR, 77 BPM, non-specific T wave abnormalities    Assessment & Plan  58 year old female  1. Atypical chest pain - risk factors include obesity, hypertension, hyperlipidemia we will order an exercise nuclear stress test to rule out ischemia.  2. Hypertension - we'll switch back to adalat form of nifedipine, otherwise her blood pressure is controlled today. We'll have to stick to aggressive management of her blood pressure and should as her echocardiogram shows mild LVH.  3. Lipids - normal HDL and triglycerides, mildly elevated LDL for now we will just encourage her diet and exercise.  Followup after the stress test    Dorothy Spark, MD, [facility] 08/17/2014, 10:21 AM

## 2014-08-23 NOTE — Discharge Summary (Signed)
[facility] Discharge Summary  Patient name: Emily [name] [facility] Medical record number: [id number] Date of birth: 07/27/1956 Age: 59 y.o. Gender: female Date of Admission: 08/11/2014  Date of Discharge: 08/12/2014 Admitting Physician: Alveda Reasons, MD  Primary Care Provider: No PCP Per Patient Consultants: None  Indication for Hospitalization: Chest Pain Rule out ACS  Discharge Diagnoses/Problem List:  Chest Pain Dyspepsia HTN HLD  Disposition: Home  Discharge Condition: Stable  Discharge Exam:  Temp: [97.8 F (36.6 C)-98.1 F (36.7 C)] 97.8 F (36.6 C) (08/20 0534)  Pulse Rate: [60-90] 66 (08/20 0534)  Resp: [10-19] 18 (08/20 0534)  BP: (97-142)/(62-90) 97/64 mmHg (08/20 0534)  SpO2: [93 %-100 %] 93 % (08/20 0534)  Weight: [206 lb 12.8 oz (93.804 kg)-212 lb 9.6 oz (96.435 kg)] 212 lb 9.6 oz (96.435 kg) (08/20 0045)  Physical Exam:  General: NAD, AAox3, resting quietly upon entrance today.  Cardiovascular: RRR, No MGR  Respiratory: Mild crackles in bilateral bases, upper lung fields clear to auscultation bilaterally, No increased WOB, Comfortable, rate regular.  Abdomen: Nontender, nondistended, Soft, +BS  Extremities: WWP, 2+ distal pulses, trace lower extremity edema.    Brief Hospital Course:   1. Chest pain, Rule out ACS: Pt. Initially presented with new onset chest pain, R arm pain, SOB, and elevated BP's 181/105 initially in the ED in the setting of subjective worsening lower extremity edema x 2 weeks. Her EKG at that time revealed normal sinus rhythm without acute changes and only evidence of potential left atrial enlargement. Further laboratory evaluation revealed troponin negative x 2, a mildly elevated D-Dimer with subsequent CT chest negative for acute PE, TSH - WNL, and Hgb A1C -6.1. She was admitted for observation, and was found to have subsequent resolution of her symptoms. She is stable and ready for discharge with outpatient  follow up with Dr. Meda Coffee as previously scheduled.   2. Hypertension: Her blood pressure was initially elevated to 181/105 in the ED. It subsequently returned to baseline of 110/64 prior to discharge. Her home metoprolol was continued with good control of her blood pressure. Her procardia was discontinued initially due to the patient's concern of it "making her blood pressure labile", but restarted prior to discharge. She is stable and ready for discharge.    3. Hyperlipidemia - At admission, her last lipid panel was 12/2013 with a total cholesterol of 222. ASCVD 10-year risk 2.6%. She has been on Crestor 20mg . This was substituted for Lipitor 40mg  during her inpatient stay, however she was restarted on her Crestor at discharge. Her lipid panel during this hospitalization revealed total Cholesterol of 179 on her current statin therapy.   Issues for Follow Up:  1. ACS ruled out - Follow up with Dr. Meda Coffee as previously scheduled. May need outpatient Echocardiogram for further cardiac evaluation given her symptomatic edema.  2. HTN - She states that she has poor control of her blood pressure with her current regimen. She says that procardia in particular makes her blood pressures labile. She may need some fine tuning of her blood pressure management for better control.   Significant Procedures: None  Significant Labs and Imaging:   Recent Labs  Lab  08/11/14 1853   WBC  7.5   HGB  12.6   HCT  37.9   PLT  267     Recent Labs  Lab  08/11/14 1853   NA  141   K  4.3   CL  102   CO2  26   BUN  9   CREATININE  0.59   CALCIUM  9.5   GLUCOSE  93    Lipid Panel    Component  Value  Date/Time    CHOL  179  08/12/2014 0156    TRIG  120  08/12/2014 0156    HDL  46  08/12/2014 0156    CHOLHDL  3.9  08/12/2014 0156    VLDL  24  08/12/2014 0156    LDLCALC  109*  08/12/2014 0156    Troponin I - 0.00 x 4  D-Dimer - 0.65  Urinalysis - WNL  TSH - 2.0  EKG 8/19 - Sinus rhythm with biphasic  p-waves in V1 suggestive of left atrial enlargement. Similar to EKG 01/02/2014.    CXR 8/19 -  EXAM:  CHEST 2 VIEW  COMPARISON: None.  FINDINGS:  The heart is mildly enlarged. The mediastinal contours are normal.  There are low lung volumes with linear atelectasis at both lung  bases. There is no confluent airspace opacity, edema or pleural  effusion. No acute osseous findings are evident.  IMPRESSION:  Cardiomegaly with subsegmental atelectasis at both lung bases. No  acute cardiopulmonary process.  Electronically Signed  By: Camie Patience M.D.  On: 08/11/2014 19:58    CTA Chest 8/19 -  EXAM:  CT ANGIOGRAPHY CHEST WITH CONTRAST  TECHNIQUE:  Multidetector CT imaging of the chest was performed using the  standard protocol during bolus administration of intravenous  contrast. Multiplanar CT image reconstructions and MIPs were  obtained to evaluate the vascular anatomy.  CONTRAST: 146mL OMNIPAQUE IOHEXOL 350 MG/ML SOLN    COMPARISON: Chest radiographs 08/11/2014. Abdominal CT 07/01/2005.  FINDINGS:  Vascular: The pulmonary arteries are well opacified with contrast.  There is no evidence of acute pulmonary embolism. No significant  atherosclerosis is demonstrated.  Mediastinum: There are no enlarged mediastinal, hilar or axillary  lymph nodes. The thyroid gland and trachea appear normal. There is a  small hiatal hernia. The heart size is normal.  Lungs/Pleura: There is a small amount of fluid within the superior  pericardial recess. There is no pleural effusion. Patchy dependent  ground-glass opacities in both lungs are most consistent with  atelectasis. There is no confluent airspace opacity or suspicious  pulmonary nodule.  Upper abdomen: Unremarkable. There is no adrenal mass.  Musculoskeletal/Chest wall: No chest wall lesion or acute osseous  findings.  Review of the MIP images confirms the above findings.  IMPRESSION:  No evidence of acute pulmonary embolism or other  acute chest  process. Small hiatal hernia.  Electronically Signed  By: Camie Patience M.D.  On: 08/11/2014 20:58   Results/Tests Pending at Time of Discharge: None  Discharge Medications:    Medication List         metoprolol succinate 25 MG 24 hr tablet  Commonly known as:  TOPROL-XL  Take 25 mg by mouth every morning.     rosuvastatin 20 MG tablet  Commonly known as:  CRESTOR  Take 20 mg by mouth at bedtime.        Discharge Instructions: Please refer to Patient Instructions section of EMR for full details.  Patient was counseled important signs and symptoms that should prompt return to medical care, changes in medications, dietary instructions, activity restrictions, and follow up appointments.   Follow-Up Appointments:     Follow-up Information   Follow up with Dorothy Spark, MD On 08/17/2014. (As previously scheduled for cardiology follow up. )    Specialty:  Cardiology   Contact information:   [place] [phone]       Follow up with Wendie Agreste, MD. Schedule an appointment as soon as possible for a visit in 1 week. [facility] Follow Up )    Specialties:  Family Medicine, Sports Medicine   Contact information:   [place] [address] [phone]       Aquilla Hacker, MD 08/23/2014, 10:07 PM PGY-1, [place]

## 2014-08-24 ENCOUNTER — Ambulatory Visit (HOSPITAL_COMMUNITY): Payer: BC Managed Care – PPO | Attending: Cardiovascular Disease | Admitting: Radiology

## 2014-08-24 VITALS — BP 112/73 | HR 69 | Ht 66.0 in | Wt 202.0 lb

## 2014-08-24 DIAGNOSIS — R0609 Other forms of dyspnea: Secondary | ICD-10-CM | POA: Insufficient documentation

## 2014-08-24 DIAGNOSIS — R002 Palpitations: Secondary | ICD-10-CM | POA: Insufficient documentation

## 2014-08-24 DIAGNOSIS — R0602 Shortness of breath: Secondary | ICD-10-CM

## 2014-08-24 DIAGNOSIS — R079 Chest pain, unspecified: Secondary | ICD-10-CM | POA: Diagnosis not present

## 2014-08-24 DIAGNOSIS — R0989 Other specified symptoms and signs involving the circulatory and respiratory systems: Secondary | ICD-10-CM | POA: Insufficient documentation

## 2014-08-24 DIAGNOSIS — R42 Dizziness and giddiness: Secondary | ICD-10-CM | POA: Diagnosis not present

## 2014-08-24 MED ORDER — TECHNETIUM TC 99M SESTAMIBI GENERIC - CARDIOLITE
33.0000 | Freq: Once | INTRAVENOUS | Status: AC | PRN
  Administered 2014-08-24: 33 via INTRAVENOUS

## 2014-08-24 MED ORDER — TECHNETIUM TC 99M SESTAMIBI GENERIC - CARDIOLITE
11.0000 | Freq: Once | INTRAVENOUS | Status: AC | PRN
  Administered 2014-08-24: 11 via INTRAVENOUS

## 2014-08-24 NOTE — Progress Notes (Signed)
[facility] [address] [place], [place] [address] [phone]    Cardiology Nuclear Med Study  Emily [name] is a 58 y.o. female     MRN : [id number]     DOB: December 25, 1955  Procedure Date: 08/24/2014  Nuclear Med Background Indication for Stress Test:  Evaluation for Ischemia, Abnormal EKG, and Patient seen in hospital on 08-11-2014 for Chest Pain, BP 181/105, enzymes negative History:  No prior known history of CAD, 07-2014 Echo: EF=55-60% Cardiac Risk Factors: Hypertension and Lipids  Symptoms: Chest Pain with/without exertion (last occurrence yesterday), Dizziness, DOE and Palpitations   Nuclear Pre-Procedure Caffeine/Decaff Intake:  None> 12 hrs NPO After: 7:00pm   Lungs:  clear O2 Sat: 97% on room air. IV 0.9% NS with Angio Cath:  22g  IV Site: R Hand  IV Started by:  Matilde Haymaker, RN  Chest Size (in):  38 Cup Size: C  Height: 5\' 6"  (1.676 m)  Weight:  202 lb (91.627 kg)  BMI:  Body mass index is 32.62 kg/(m^2). Tech Comments:  No Toprol x 18 hrs    Nuclear Med Study 1 or 2 day study: 1 day  Stress Test Type:  Stress  Reading MD: n/a  Order Authorizing Provider:  Filiberto Pinks  Resting Radionuclide: Technetium 18m Sestamibi  Resting Radionuclide Dose: 11.0 mCi   Stress Radionuclide:  Technetium 71m Sestamibi  Stress Radionuclide Dose: 33.0 mCi           Stress Protocol Rest HR: 69 Stress HR: 148  Rest BP: 112/73 Stress BP: 160/83  Exercise Time (min): 6:15 METS: 7.0   Predicted Max HR: 164 bpm % Max HR: 90.24 bpm Rate Pressure Product: 23680   Dose of Adenosine (mg):  n/a Dose of Lexiscan: n/a mg  Dose of Atropine (mg): n/a Dose of Dobutamine: n/a mcg/kg/min (at max HR)  Stress Test Technologist: Irven Baltimore, RN  Nuclear Technologist:  Annye Rusk, CNMT     Rest Procedure:  Myocardial perfusion imaging was performed at rest 45 minutes following the intravenous administration of Technetium 71m Sestamibi. Rest  ECG: NSR - Normal EKG  Stress Procedure:  The patient exercised on the treadmill utilizing the Bruce Protocol for 6:15 minutes, RPE=16.The patient stopped due to DOE and denied any chest pain.  Technetium 13m Sestamibi was injected at peak exercise and myocardial perfusion imaging was performed after a brief delay. Stress ECG: No significant change from baseline ECG and No significant ST segment change suggestive of ischemia.  QPS Raw Data Images:  Normal; no motion artifact; normal heart/lung ratio. Stress Images:  There is decreased uptake in the apex. Rest Images:  There is decreased uptake in the apex. Subtraction (SDS):  Fixed apical attenuation artifact Transient Ischemic Dilatation (Normal <1.22):  1.17 Lung/Heart Ratio (Normal <0.45):  0.39  Quantitative Gated Spect Images QGS EDV:  79 ml QGS ESV:  17 ml  Impression Exercise Capacity:  Good exercise capacity. BP Response:  Normal blood pressure response. Clinical Symptoms:  No significant symptoms noted. ECG Impression:  No significant ST segment change suggestive of ischemia. Comparison with Prior Nuclear Study: No previous nuclear study performed  Overall Impression:  Low risk stress nuclear study with fixed apical attenuation artifact. No reversible ischemia.  LV Ejection Fraction: 78%.  LV Wall Motion:  Normal Wall Motion  Pixie Casino, MD, [facility] Board Certified in Nuclear Cardiology Attending Cardiologist [place]

## 2014-08-24 NOTE — Discharge Summary (Signed)
Family Medicine Teaching Service  Discharge Note : Attending Jeff Arlanda Shiplett MD Pager [phone] Inpatient Team Pager:  [phone]  I have reviewed this patient and the patient's chart and have discussed discharge planning with the resident at the time of discharge. I agree with the discharge plan as above.    

## 2014-08-25 ENCOUNTER — Telehealth: Payer: Self-pay | Admitting: Cardiology

## 2014-08-25 NOTE — Telephone Encounter (Signed)
Notified pt of normal stress test, normal heart function, and to follow up in 1-2 months per Dr Meda Coffee.  Pt would like to follow-up in 1 month.  Informed pt that I will send our schedulers a message to contact her to have this appt scheduled.  Pt verbalized understanding, pleased with the news, and agrees with this plan.

## 2014-08-25 NOTE — Telephone Encounter (Signed)
New message ° ° ° ° °Want stress test results °

## 2014-09-01 ENCOUNTER — Encounter: Payer: Self-pay | Admitting: Family Medicine

## 2014-09-08 ENCOUNTER — Encounter: Payer: Self-pay | Admitting: Cardiology

## 2014-09-08 ENCOUNTER — Ambulatory Visit (INDEPENDENT_AMBULATORY_CARE_PROVIDER_SITE_OTHER): Payer: BC Managed Care – PPO | Admitting: Cardiology

## 2014-09-08 VITALS — BP 126/86 | HR 71 | Ht 66.0 in | Wt 205.0 lb

## 2014-09-08 DIAGNOSIS — I10 Essential (primary) hypertension: Secondary | ICD-10-CM

## 2014-09-08 DIAGNOSIS — F419 Anxiety disorder, unspecified: Secondary | ICD-10-CM

## 2014-09-08 DIAGNOSIS — F411 Generalized anxiety disorder: Secondary | ICD-10-CM

## 2014-09-08 MED ORDER — ALPRAZOLAM 0.25 MG PO TABS: 0.2500 mg | ORAL_TABLET | Freq: Three times a day (TID) | ORAL | Status: DC | PRN

## 2014-09-08 MED ORDER — NIFEDIPINE 10 MG PO CAPS: ORAL_CAPSULE | ORAL | Status: DC

## 2014-09-08 MED ORDER — METOPROLOL SUCCINATE ER 25 MG PO TB24: 25.0000 mg | ORAL_TABLET | Freq: Every morning | ORAL | Status: DC

## 2014-09-08 NOTE — Patient Instructions (Signed)
Your physician has recommended you make the following change in your medication:    DECREASE YOUR NIFEDIPINE TO 10 MG ONCE DAILY- TAKE IN THE EVENINGS  -----YOU CAN START TAKING 30 MG IN ONE MONTH (3 TABLETS) IF ABLE TO TOLERATE THE 10 MG    CONTINUE TAKING YOUR METOPROLOL - BUT TAKE THIS IN THE MORNING    REFILLED Devona Konig FOR 1 MONTH ONLY   Your physician recommends that you schedule a follow-up appointment in: [place]

## 2014-09-08 NOTE — Progress Notes (Signed)
Patient ID: SHANIRA TINE, female   DOB: [birth year], 57 y.o.   MRN: [id number]     Patient Name: Emily [name] [facility] Date of Encounter: 09/08/2014  Primary Care Provider:  No PCP Per Patient Primary Cardiologist:  Dorothy Spark  Problem List   Past Medical History  Diagnosis Date  . Hypertension   . Hyperlipidemia   . Back pain   . GERD (gastroesophageal reflux disease)    Past Surgical History  Procedure Laterality Date  . Abdominal hysterectomy     Allergies  Allergies  Allergen Reactions  . Aspirin Hives  . Nsaids Hives    HPI  58 year old female originally from [place] who is coming with complaints of chest pain. The patient has history of hypertension, hyperlipidemia has been experiencing chest pain when she lays down and also chest pain associated with high blood pressure. She went to the ER on 08/11/2014 her initial blood pressure was 181/105. She had worsening lower extremity edema at the time she was ruled out for myocardial infarction and CT chest was negative for acute pulmonary embolism. EKG shows only nonspecific T wave abnormalities. She has never smoked and has no family history of coronary artery disease. She has been on Prevacid for gastric reflux for the last 5 years. She states that her nifedipine Adalat has been changed to nifedipine XL and her symptoms started at that time. She underwent echocardiography with findings of mild LVH , impaired relaxation otherwise normal echocardiogram.  Home Medications  Prior to Admission medications   Medication Sig Start Date End Date Taking? Authorizing Provider  ALPRAZolam Duanne Moron) 0.25 MG tablet Take 1 tablet by mouth 3 (three) times daily as needed for anxiety.  08/09/14  Yes Historical Provider, MD  metoprolol succinate (TOPROL-XL) 25 MG 24 hr tablet Take 25 mg by mouth every morning.    Yes Historical Provider, MD  NIFEDICAL XL 30 MG 24 hr tablet TAKE 1 TABLET BY MOUTH DAILY 08/16/14  Yes Posey Boyer,  MD  rosuvastatin (CRESTOR) 20 MG tablet Take 20 mg by mouth at bedtime.    Yes Historical Provider, MD    Family History  Family History  Problem Relation Age of Onset  . Aneurysm Mother   . Diabetes Father     Social History  History   Social History  . Marital Status: Married    Spouse Name: [name]    Number of Children: [name]  . Years of Education: [name]   Occupational History  . Not on file.   Social History Main Topics  . Smoking status: Never Smoker   . Smokeless tobacco: Not on file  . Alcohol Use: Yes  . Drug Use: No  . Sexual Activity: Not on file   Other Topics Concern  . Not on file   Social History Narrative  . No narrative on file     Review of Systems, as per HPI, otherwise negative General:  No chills, fever, night sweats or weight changes.  Cardiovascular:  No chest pain, dyspnea on exertion, edema, orthopnea, palpitations, paroxysmal nocturnal dyspnea. Dermatological: No rash, lesions/masses Respiratory: No cough, dyspnea Urologic: No hematuria, dysuria Abdominal:   No nausea, vomiting, diarrhea, bright red blood per rectum, melena, or hematemesis Neurologic:  No visual changes, wkns, changes in mental status. All other systems reviewed and are otherwise negative except as noted above.  Physical Exam  Blood pressure 126/86, pulse 71, height 5\' 6"  (1.676 m), weight 205 lb (92.987 kg).  General: Pleasant, NAD Psych: Normal  affect. Neuro: Alert and oriented X 3. Moves all extremities spontaneously. HEENT: Normal  Neck: Supple without bruits or JVD. Lungs:  Resp regular and unlabored, CTA. Heart: RRR no s3, s4, or murmurs. Abdomen: Soft, non-tender, non-distended, BS + x 4.  Extremities: No clubbing, cyanosis or edema. DP/PT/Radials 2+ and equal bilaterally.  Labs:  No results found for this basename: CKTOTAL, CKMB, TROPONINI,  in the last 72 hours Lab Results  Component Value Date   WBC 7.5 08/11/2014   HGB 12.6 08/11/2014   HCT 37.9 08/11/2014    MCV 75.8* 08/11/2014   PLT 267 08/11/2014    Lab Results  Component Value Date   DDIMER 0.65* 08/11/2014   No components found with this basename: POCBNP,     Component Value Date/Time   NA 141 08/11/2014 1853   K 4.3 08/11/2014 1853   CL 102 08/11/2014 1853   CO2 26 08/11/2014 1853   GLUCOSE 93 08/11/2014 1853   BUN 9 08/11/2014 1853   CREATININE 0.59 08/11/2014 1853   CREATININE 0.69 01/11/2014 2131   CALCIUM 9.5 08/11/2014 1853   PROT 7.4 01/11/2014 2131   ALBUMIN 4.2 01/11/2014 2131   AST 18 01/11/2014 2131   ALT 15 01/11/2014 2131   ALKPHOS 135* 01/11/2014 2131   BILITOT 0.3 01/11/2014 2131   GFRNONAA >90 08/11/2014 1853   GFRAA >90 08/11/2014 1853   Lab Results  Component Value Date   CHOL 179 08/12/2014   HDL 46 08/12/2014   LDLCALC 109* 08/12/2014   TRIG 120 08/12/2014   Accessory Clinical Findings  Echocardiogram - 08/12/2014 Left ventricle: The cavity size was normal. Wall thickness was increased in a pattern of mild LVH. Systolic function was normal. The estimated ejection fraction was in the range of 55% to 60%. Doppler parameters are consistent with abnormal left ventricular relaxation (grade 1 diastolic dysfunction).  ECG - SR, 77 BPM, non-specific T wave abnormalities    Assessment & Plan  58 year old female  1. Atypical chest pain - risk factors include obesity, hypertension, hyperlipidemia, negative nuclear stress test for prior MI or ischemia,  Good exercise capacity, normal BP response to exercise.  2. Hypertension - now controlled, however the patient feels tired and dizzy, she is instructed to take Toprol in the am and nifedipine in the evening and cut in half for 1 month then increase to full pill.  3. Lipids - normal HDL and triglycerides, mildly elevated LDL for now we will just encourage her diet and exercise.  Followup in 2 months.    Dorothy Spark, MD, [facility] 09/08/2014, 8:56 AM

## 2014-10-29 ENCOUNTER — Other Ambulatory Visit: Payer: Self-pay

## 2014-10-29 MED ORDER — ROSUVASTATIN CALCIUM 20 MG PO TABS: 20.0000 mg | ORAL_TABLET | Freq: Every day | ORAL | Status: DC

## 2014-11-03 ENCOUNTER — Other Ambulatory Visit: Payer: Self-pay | Admitting: *Deleted

## 2014-11-10 ENCOUNTER — Ambulatory Visit: Payer: BC Managed Care – PPO | Admitting: Cardiology

## 2014-12-07 ENCOUNTER — Ambulatory Visit: Payer: BC Managed Care – PPO | Admitting: Cardiology

## 2014-12-09 ENCOUNTER — Ambulatory Visit: Payer: BC Managed Care – PPO | Admitting: Cardiology

## 2014-12-12 ENCOUNTER — Other Ambulatory Visit: Payer: Self-pay | Admitting: Family Medicine

## 2014-12-19 ENCOUNTER — Other Ambulatory Visit: Payer: Self-pay | Admitting: Family Medicine

## 2014-12-19 ENCOUNTER — Ambulatory Visit (INDEPENDENT_AMBULATORY_CARE_PROVIDER_SITE_OTHER): Payer: BC Managed Care – PPO | Admitting: Family Medicine

## 2014-12-19 VITALS — BP 124/78 | HR 65 | Temp 98.1°F | Resp 16 | Ht 65.25 in | Wt 198.6 lb

## 2014-12-19 DIAGNOSIS — R197 Diarrhea, unspecified: Secondary | ICD-10-CM

## 2014-12-19 DIAGNOSIS — R140 Abdominal distension (gaseous): Secondary | ICD-10-CM

## 2014-12-19 DIAGNOSIS — F419 Anxiety disorder, unspecified: Secondary | ICD-10-CM

## 2014-12-19 DIAGNOSIS — I10 Essential (primary) hypertension: Secondary | ICD-10-CM

## 2014-12-19 DIAGNOSIS — E785 Hyperlipidemia, unspecified: Secondary | ICD-10-CM

## 2014-12-19 LAB — POCT URINALYSIS DIPSTICK
Bilirubin, UA: NEGATIVE
Blood, UA: NEGATIVE
Glucose, UA: NEGATIVE
Ketones, UA: 15
Leukocytes, UA: NEGATIVE
Nitrite, UA: NEGATIVE
Protein, UA: NEGATIVE
Spec Grav, UA: 1.02
Urobilinogen, UA: 0.2
pH, UA: 6

## 2014-12-19 LAB — POCT CBC
Granulocyte percent: 61.7 %G (ref 37–80)
HCT, POC: 41.4 % (ref 37.7–47.9)
Hemoglobin: 13 g/dL (ref 12.2–16.2)
Lymph, poc: 2.4 (ref 0.6–3.4)
MCH, POC: 24.4 pg — AB (ref 27–31.2)
MCHC: 31.4 g/dL — AB (ref 31.8–35.4)
MCV: 77.8 fL — AB (ref 80–97)
MID (cbc): 0.4 (ref 0–0.9)
MPV: 8.1 fL (ref 0–99.8)
POC Granulocyte: 4.5 (ref 2–6.9)
POC LYMPH PERCENT: 32.6 %L (ref 10–50)
POC MID %: 5.7 %M (ref 0–12)
Platelet Count, POC: 253 10*3/uL (ref 142–424)
RBC: 5.33 M/uL (ref 4.04–5.48)
RDW, POC: 14.4 %
WBC: 7.3 10*3/uL (ref 4.6–10.2)

## 2014-12-19 MED ORDER — ROSUVASTATIN CALCIUM 20 MG PO TABS: 20.0000 mg | ORAL_TABLET | Freq: Every day | ORAL | Status: DC

## 2014-12-19 MED ORDER — NIFEDIPINE 10 MG PO CAPS: ORAL_CAPSULE | ORAL | Status: DC

## 2014-12-19 MED ORDER — ALPRAZOLAM 0.25 MG PO TABS: 0.2500 mg | ORAL_TABLET | Freq: Three times a day (TID) | ORAL | Status: DC | PRN

## 2014-12-19 MED ORDER — METOPROLOL SUCCINATE ER 25 MG PO TB24: 25.0000 mg | ORAL_TABLET | Freq: Every morning | ORAL | Status: DC

## 2014-12-19 NOTE — Progress Notes (Addendum)
Subjective:    Patient ID: Emily [name], female    DOB: 1956/06/01, 58 y.o.   MRN: [id number] This chart was scribed for Robyn Haber, MD by Zola Button, Medical Scribe. This patient was seen in Room 9 and the patient's care was started at 4:41 PM.   HPI HPI Comments: Emily [name] is a 58 y.o. female with a hx of HTN and HLD who presents to the [facility] for a medication refill. Patient complains of intermittent diarrhea for the past couple of months. She states that her last colonoscopy was 2 years ago.  She also has intermittent vague abdominal pains in the lower abdomen and right side. These of and also occurring over the last 3 months.   Review of Systems  Gastrointestinal: Positive for diarrhea.   No chest pain, shortness of breath, leg swelling, fevers of unexplained nature, headache, sore throat, ear pain, neck swelling, unexplained rashes    Objective:   Physical Exam CONSTITUTIONAL: Well developed/well nourished HEAD: Normocephalic/atraumatic EYES: EOM/PERRL ENMT: Mucous membranes moist NECK: supple no meningeal signs SPINE: entire spine nontender CV: S1/S2 noted, no murmurs/rubs/gallops noted LUNGS: Lungs are clear to auscultation bilaterally, no apparent distress ABDOMEN: soft, nontender, no rebound or guarding GU: no cva tenderness NEURO: Pt is awake/alert, moves all extremitiesx4 EXTREMITIES: pulses normal, full ROM SKIN: warm, color normal PSYCH: no abnormalities of mood noted Results for orders placed or performed in visit on 12/19/14  POCT urinalysis dipstick  Result Value Ref Range   Color, UA yellow    Clarity, UA clear    Glucose, UA neg    Bilirubin, UA neg    Ketones, UA 15    Spec Grav, UA 1.020    Blood, UA neg    pH, UA 6.0    Protein, UA neg    Urobilinogen, UA 0.2    Nitrite, UA neg    Leukocytes, UA Negative   POCT CBC  Result Value Ref Range   WBC 7.3 4.6 - 10.2 K/uL   Lymph, poc 2.4 0.6 - 3.4     POC LYMPH PERCENT 32.6 10 - 50 %L   MID (cbc) 0.4 0 - 0.9   POC MID % 5.7 0 - 12 %M   POC Granulocyte 4.5 2 - 6.9   Granulocyte percent 61.7 37 - 80 %G   RBC 5.33 4.04 - 5.48 M/uL   Hemoglobin 13.0 12.2 - 16.2 g/dL   HCT, POC 41.4 37.7 - 47.9 %   MCV 77.8 (A) 80 - 97 fL   MCH, POC 24.4 (A) 27 - 31.2 pg   MCHC 31.4 (A) 31.8 - 35.4 g/dL   RDW, POC 14.4 %   Platelet Count, POC 253 142 - 424 K/uL   MPV 8.1 0 - 99.8 fL   Results for orders placed or performed in visit on 12/19/14  POCT urinalysis dipstick  Result Value Ref Range   Color, UA yellow    Clarity, UA clear    Glucose, UA neg    Bilirubin, UA neg    Ketones, UA 15    Spec Grav, UA 1.020    Blood, UA neg    pH, UA 6.0    Protein, UA neg    Urobilinogen, UA 0.2    Nitrite, UA neg    Leukocytes, UA Negative   POCT CBC  Result Value Ref Range   WBC 7.3 4.6 - 10.2 K/uL   Lymph, poc 2.4 0.6 - 3.4  POC LYMPH PERCENT 32.6 10 - 50 %L   MID (cbc) 0.4 0 - 0.9   POC MID % 5.7 0 - 12 %M   POC Granulocyte 4.5 2 - 6.9   Granulocyte percent 61.7 37 - 80 %G   RBC 5.33 4.04 - 5.48 M/uL   Hemoglobin 13.0 12.2 - 16.2 g/dL   HCT, POC 41.4 37.7 - 47.9 %   MCV 77.8 (A) 80 - 97 fL   MCH, POC 24.4 (A) 27 - 31.2 pg   MCHC 31.4 (A) 31.8 - 35.4 g/dL   RDW, POC 14.4 %   Platelet Count, POC 253 142 - 424 K/uL   MPV 8.1 0 - 99.8 fL        Assessment & Plan:   This chart was scribed in my presence and reviewed by me personally.    ICD-9-CM ICD-10-CM   1. Essential hypertension 401.9 I10 TSH     Comprehensive metabolic panel     POCT urinalysis dipstick     POCT CBC     NIFEdipine (PROCARDIA) 10 MG capsule     metoprolol succinate (TOPROL-XL) 25 MG 24 hr tablet     CANCELED: POCT UA - Microscopic Only  2. Hyperlipidemia 272.4 E78.5 TSH     Comprehensive metabolic panel     POCT urinalysis dipstick     POCT CBC     Lipid panel     rosuvastatin (CRESTOR) 20 MG tablet     CANCELED: POCT UA - Microscopic Only  3. Abdominal  bloating 787.3 R14.0 TSH     Comprehensive metabolic panel     POCT urinalysis dipstick     POCT CBC     US Abdomen Complete     CANCELED: POCT UA - Microscopic Only  4. Diarrhea 787.91 R19.7 TSH     Comprehensive metabolic panel     POCT urinalysis dipstick     POCT CBC     US Abdomen Complete     CANCELED: POCT UA - Microscopic Only  5. Anxiety 300.00 F41.9 ALPRAZolam (XANAX) 0.25 MG tablet   If labs so reveal the cause of the vague abdominal pain and diarrhea, will proceed with an ultrasound and possible gastroenterology referral  Signed, Robyn Haber, MD

## 2014-12-20 LAB — COMPREHENSIVE METABOLIC PANEL
ALT: 20 U/L (ref 0–35)
AST: 15 U/L (ref 0–37)
Albumin: 4 g/dL (ref 3.5–5.2)
Alkaline Phosphatase: 133 U/L — ABNORMAL HIGH (ref 39–117)
BUN: 13 mg/dL (ref 6–23)
CO2: 27 mEq/L (ref 19–32)
Calcium: 10.2 mg/dL (ref 8.4–10.5)
Chloride: 105 mEq/L (ref 96–112)
Creat: 0.62 mg/dL (ref 0.50–1.10)
Glucose, Bld: 105 mg/dL — ABNORMAL HIGH (ref 70–99)
Potassium: 3.9 mEq/L (ref 3.5–5.3)
Sodium: 141 mEq/L (ref 135–145)
Total Bilirubin: 0.3 mg/dL (ref 0.2–1.2)
Total Protein: 7.3 g/dL (ref 6.0–8.3)

## 2014-12-20 LAB — LIPID PANEL
Cholesterol: 164 mg/dL (ref 0–200)
HDL: 38 mg/dL — ABNORMAL LOW (ref >39)
LDL Cholesterol: 92 mg/dL (ref 0–99)
Total CHOL/HDL Ratio: 4.3 Ratio
Triglycerides: 169 mg/dL — ABNORMAL HIGH (ref <150)
VLDL: 34 mg/dL (ref 0–40)

## 2014-12-20 LAB — TSH: TSH: 0.854 u[IU]/mL (ref 0.350–4.500)

## 2014-12-31 ENCOUNTER — Encounter: Payer: Self-pay | Admitting: Cardiology

## 2014-12-31 ENCOUNTER — Ambulatory Visit (INDEPENDENT_AMBULATORY_CARE_PROVIDER_SITE_OTHER): Payer: 59 | Admitting: Cardiology

## 2014-12-31 VITALS — BP 124/88 | HR 72 | Ht 65.25 in | Wt 198.0 lb

## 2014-12-31 DIAGNOSIS — I10 Essential (primary) hypertension: Secondary | ICD-10-CM

## 2014-12-31 DIAGNOSIS — R072 Precordial pain: Secondary | ICD-10-CM

## 2014-12-31 MED ORDER — METOPROLOL SUCCINATE ER 25 MG PO TB24: 12.5000 mg | ORAL_TABLET | Freq: Every morning | ORAL | Status: DC

## 2014-12-31 NOTE — Progress Notes (Signed)
Patient ID: Emily [name], female   DOB: 02-06-1956, 59 y.o.   MRN: [id number] Patient ID: Emily [name], female   DOB: Jul 01, 1956, 59 y.o.   MRN: [id number]     Patient Name: Emily [name] [facility] Date of Encounter: 12/31/2014  Primary Care Provider:  No PCP Per Patient Primary Cardiologist:  Dorothy Spark  Problem List   Past Medical History  Diagnosis Date  . Hypertension   . Hyperlipidemia   . Back pain   . GERD (gastroesophageal reflux disease)    Past Surgical History  Procedure Laterality Date  . Abdominal hysterectomy     Allergies  Allergies  Allergen Reactions  . Aspirin Hives  . Nsaids Hives   HPI  59 year old female originally from [place] who is coming with complaints of chest pain. The patient has history of hypertension, hyperlipidemia has been experiencing chest pain when she lays down and also chest pain associated with high blood pressure. She went to the ER on 08/11/2014 her initial blood pressure was 181/105. She had worsening lower extremity edema at the time she was ruled out for myocardial infarction and CT chest was negative for acute pulmonary embolism. EKG shows only nonspecific T wave abnormalities. She has never smoked and has no family history of coronary artery disease. She has been on Prevacid for gastric reflux for the last 5 years. She states that her nifedipine Adalat has been changed to nifedipine XL and her symptoms started at that time. She underwent echocardiography with findings of mild LVH , impaired relaxation otherwise normal echocardiogram.  12/31/2014 - the patient is coming after 2 months she states that when she takes Adalat 30 mg every day her blood pressure goes too low and she feels chest pressure. When she doesn't take it her blood pressure goes too high. She denies any palpitations syncope or lower extremity edema.    Home Medications  Prior to Admission medications   Medication Sig Start Date End Date  Taking? Authorizing Provider  ALPRAZolam Duanne Moron) 0.25 MG tablet Take 1 tablet by mouth 3 (three) times daily as needed for anxiety.  08/09/14  Yes Historical Provider, MD  metoprolol succinate (TOPROL-XL) 25 MG 24 hr tablet Take 25 mg by mouth every morning.    Yes Historical Provider, MD  NIFEDICAL XL 30 MG 24 hr tablet TAKE 1 TABLET BY MOUTH DAILY 08/16/14  Yes Posey Boyer, MD  rosuvastatin (CRESTOR) 20 MG tablet Take 20 mg by mouth at bedtime.    Yes Historical Provider, MD    Family History  Family History  Problem Relation Age of Onset  . Aneurysm Mother   . Diabetes Father     Social History  History   Social History  . Marital Status: Married    Spouse Name: [name]    Number of Children: [name]  . Years of Education: [name]   Occupational History  . Not on file.   Social History Main Topics  . Smoking status: Never Smoker   . Smokeless tobacco: Not on file  . Alcohol Use: Yes  . Drug Use: No  . Sexual Activity: Not on file   Other Topics Concern  . Not on file   Social History Narrative     Review of Systems, as per HPI, otherwise negative General:  No chills, fever, night sweats or weight changes.  Cardiovascular:  No chest pain, dyspnea on exertion, edema, orthopnea, palpitations, paroxysmal nocturnal dyspnea. Dermatological: No rash, lesions/masses Respiratory: No cough, dyspnea Urologic: No hematuria,  dysuria Abdominal:   No nausea, vomiting, diarrhea, bright red blood per rectum, melena, or hematemesis Neurologic:  No visual changes, wkns, changes in mental status. All other systems reviewed and are otherwise negative except as noted above.  Physical Exam  There were no vitals taken for this visit.  General: Pleasant, NAD Psych: Normal affect. Neuro: Alert and oriented X 3. Moves all extremities spontaneously. HEENT: Normal  Neck: Supple without bruits or JVD. Lungs:  Resp regular and unlabored, CTA. Heart: RRR no s3, s4, or murmurs. Abdomen: Soft,  non-tender, non-distended, BS + x 4.  Extremities: No clubbing, cyanosis or edema. DP/PT/Radials 2+ and equal bilaterally.  Labs:  No results for input(s): CKTOTAL, CKMB, TROPONINI in the last 72 hours. Lab Results  Component Value Date   WBC 7.3 12/19/2014   HGB 13.0 12/19/2014   HCT 41.4 12/19/2014   MCV 77.8* 12/19/2014   PLT 267 08/11/2014    Lab Results  Component Value Date   DDIMER 0.65* 08/11/2014   Invalid input(s): POCBNP    Component Value Date/Time   NA 141 12/19/2014 1704   K 3.9 12/19/2014 1704   CL 105 12/19/2014 1704   CO2 27 12/19/2014 1704   GLUCOSE 105* 12/19/2014 1704   BUN 13 12/19/2014 1704   CREATININE 0.62 12/19/2014 1704   CREATININE 0.59 08/11/2014 1853   CALCIUM 10.2 12/19/2014 1704   PROT 7.3 12/19/2014 1704   ALBUMIN 4.0 12/19/2014 1704   AST 15 12/19/2014 1704   ALT 20 12/19/2014 1704   ALKPHOS 133* 12/19/2014 1704   BILITOT 0.3 12/19/2014 1704   GFRNONAA >90 08/11/2014 1853   GFRAA >90 08/11/2014 1853   Lab Results  Component Value Date   CHOL 164 12/19/2014   HDL 38* 12/19/2014   LDLCALC 92 12/19/2014   TRIG 169* 12/19/2014   Accessory Clinical Findings  Echocardiogram - 08/12/2014 Left ventricle: The cavity size was normal. Wall thickness was increased in a pattern of mild LVH. Systolic function was normal. The estimated ejection fraction was in the range of 55% to 60%. Doppler parameters are consistent with abnormal left ventricular relaxation (grade 1 diastolic dysfunction).  ECG - SR, 77 BPM, non-specific T wave abnormalities    Assessment & Plan  59 year old female  1. Atypical chest pain - risk factors include obesity, hypertension, hyperlipidemia, negative nuclear stress test for prior MI or ischemia,  Good exercise capacity, normal BP response to exercise.  2. Hypertension - difficult to control she is advised to take Adalat 30 mg every day and will cut Toprol XL to 12.5 mg daily. Patient is educated how to  contact us through my chart and let us know what her blood pressure diary.   3. Lipids - normal HDL and triglycerides, mildly elevated LDL for now we will just encourage her diet and exercise.  Followup in 4 months.    Dorothy Spark, MD, [facility] 12/31/2014, 9:48 AM

## 2014-12-31 NOTE — Patient Instructions (Addendum)
Your physician has recommended you make the following change in your medication:  1)  DECREASE Metoprolol to 12.5mg  daily  Your physician wants you to follow-up in: 4 months with Dr. Meda Coffee. You will receive a reminder letter in the mail two months in advance. If you don't receive a letter, please call our office to schedule the follow-up appointment.

## 2015-02-01 ENCOUNTER — Ambulatory Visit (INDEPENDENT_AMBULATORY_CARE_PROVIDER_SITE_OTHER): Payer: 59 | Admitting: Family Medicine

## 2015-02-01 VITALS — BP 118/70 | HR 75 | Temp 98.0°F | Resp 16 | Ht 64.25 in | Wt 198.0 lb

## 2015-02-01 DIAGNOSIS — G8929 Other chronic pain: Secondary | ICD-10-CM

## 2015-02-01 DIAGNOSIS — R1013 Epigastric pain: Secondary | ICD-10-CM

## 2015-02-01 DIAGNOSIS — R140 Abdominal distension (gaseous): Secondary | ICD-10-CM

## 2015-02-01 DIAGNOSIS — Z86010 Personal history of colonic polyps: Secondary | ICD-10-CM

## 2015-02-01 DIAGNOSIS — Z9889 Other specified postprocedural states: Secondary | ICD-10-CM

## 2015-02-01 MED ORDER — HYOSCYAMINE SULFATE 0.125 MG SL SUBL: 0.1250 mg | SUBLINGUAL_TABLET | SUBLINGUAL | Status: DC | PRN

## 2015-02-01 NOTE — Progress Notes (Signed)
° °  Subjective:  This chart was scribed for Robyn Haber, MD by Randa Evens, ED Scribe. This Patient was seen in room 04 and the patients care was started at 5:11 PM   Patient ID: Emily [name], female    DOB: 07-23-1956, 59 y.o.   MRN: [id number]  Chief Complaint  Patient presents with   Abdominal Pain    Onset 5 days   Stomach inflamed   Gassy    HPI HPI Comments: Emily [name] is a 59 y.o. female who presents to the [facility] complaining of recurrent constant abdominal pain onset 5 days prior. Pt state she has associated nausea. Pt states she has had increased flatulence as well. Pt states she has tried Nexium with no relief. Pt denies vomiting or other related symptoms.   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   Review of Systems  Gastrointestinal: Positive for nausea and abdominal pain. Negative for vomiting.     Objective:   BP 118/70 mmHg   Pulse 75   Temp(Src) 98 F (36.7 C) (Oral)   Resp 16   Ht 5' 4.25" (1.632 m)   Wt 198 lb (89.812 kg)   BMI 33.72 kg/m2   SpO2 96%    Physical Exam  Constitutional: She is oriented to person, place, and time. She appears well-developed and well-nourished. No distress.  HENT:  Head: Normocephalic and atraumatic.  Eyes: Conjunctivae and EOM are normal.  Neck: Neck supple. No tracheal deviation present.  Cardiovascular: Normal rate.   Pulmonary/Chest: Effort normal. No respiratory distress.  Abdominal: There is tenderness in the epigastric area.  Musculoskeletal: Normal range of motion.  Neurological: She is alert and oriented to person, place, and time.  Skin: Skin is warm and dry.  Psychiatric: She has a normal mood and affect. Her behavior is normal.  Nursing note and vitals reviewed.    Assessment & Plan:     I personally performed the services described in this documentation, which was scribed in my presence. The recorded information has been reviewed and is accurate.  This chart  was scribed in my presence and reviewed by me personally.    ICD-9-CM ICD-10-CM   1. Abdominal pain, chronic, epigastric 789.06 R10.13 hyoscyamine (LEVSIN/SL) 0.125 MG SL tablet   338.29 G89.29 Ambulatory referral to Gastroenterology  2. H/O colonoscopy with polypectomy V45.89 Z98.89 Ambulatory referral to Gastroenterology   V12.72    3. Bloating 787.3 R14.0 hyoscyamine (LEVSIN/SL) 0.125 MG SL tablet     Ambulatory referral to Gastroenterology     Signed, Robyn Haber, MD

## 2015-02-03 ENCOUNTER — Encounter: Payer: Self-pay | Admitting: Nurse Practitioner

## 2015-02-03 ENCOUNTER — Other Ambulatory Visit: Payer: Self-pay | Admitting: Cardiology

## 2015-02-11 ENCOUNTER — Encounter: Payer: Self-pay | Admitting: Nurse Practitioner

## 2015-02-11 ENCOUNTER — Ambulatory Visit (INDEPENDENT_AMBULATORY_CARE_PROVIDER_SITE_OTHER): Payer: 59 | Admitting: Nurse Practitioner

## 2015-02-11 ENCOUNTER — Other Ambulatory Visit (INDEPENDENT_AMBULATORY_CARE_PROVIDER_SITE_OTHER): Payer: 59

## 2015-02-11 VITALS — BP 110/70 | HR 72 | Ht 66.0 in | Wt 197.0 lb

## 2015-02-11 DIAGNOSIS — R1012 Left upper quadrant pain: Secondary | ICD-10-CM

## 2015-02-11 DIAGNOSIS — R140 Abdominal distension (gaseous): Secondary | ICD-10-CM

## 2015-02-11 DIAGNOSIS — R194 Change in bowel habit: Secondary | ICD-10-CM

## 2015-02-11 LAB — COMPREHENSIVE METABOLIC PANEL
ALT: 19 U/L (ref 0–35)
AST: 20 U/L (ref 0–37)
Albumin: 4.1 g/dL (ref 3.5–5.2)
Alkaline Phosphatase: 134 U/L — ABNORMAL HIGH (ref 39–117)
BUN: 15 mg/dL (ref 6–23)
CO2: 30 meq/L (ref 19–32)
CREATININE: 0.71 mg/dL (ref 0.40–1.20)
Calcium: 10 mg/dL (ref 8.4–10.5)
Chloride: 106 mEq/L (ref 96–112)
GFR: 90.04 mL/min (ref >60.00)
Glucose, Bld: 86 mg/dL (ref 70–99)
Potassium: 4 mEq/L (ref 3.5–5.1)
Sodium: 141 mEq/L (ref 135–145)
Total Bilirubin: 0.3 mg/dL (ref 0.2–1.2)
Total Protein: 7.8 g/dL (ref 6.0–8.3)

## 2015-02-11 LAB — CBC
HEMATOCRIT: 39.3 % (ref 36.0–46.0)
HEMOGLOBIN: 13 g/dL (ref 12.0–15.0)
MCHC: 33 g/dL (ref 30.0–36.0)
MCV: 75.5 fl — AB (ref 78.0–100.0)
Platelets: 260 10*3/uL (ref 150.0–400.0)
RBC: 5.21 Mil/uL — ABNORMAL HIGH (ref 3.87–5.11)
RDW: 14.7 % (ref 11.5–15.5)
WBC: 8.1 10*3/uL (ref 4.0–10.5)

## 2015-02-11 LAB — IBC PANEL
Iron: 45 ug/dL (ref 42–145)
Saturation Ratios: 16.3 % — ABNORMAL LOW (ref 20.0–50.0)
TRANSFERRIN: 197 mg/dL — AB (ref 212.0–360.0)

## 2015-02-11 LAB — LIPASE: Lipase: 16 U/L (ref 11.0–59.0)

## 2015-02-11 LAB — AMYLASE: Amylase: 38 U/L (ref 27–131)

## 2015-02-11 LAB — FERRITIN: Ferritin: 100.4 ng/mL (ref 10.0–291.0)

## 2015-02-11 MED ORDER — SUCRALFATE 1 G PO TABS: ORAL_TABLET | ORAL | Status: DC

## 2015-02-11 NOTE — Patient Instructions (Signed)
Please go to the basement level to have your labs drawn.  We sent a prescription to [address].  1. Carafate  Tablets.  Take Citrucel daily, you can get this at the pharmacy. Put 1 scoop in water or juice.

## 2015-02-11 NOTE — Progress Notes (Addendum)
HPI :  Patient is a 59 year old non-English speaking female referred by PCP for abdominal pain. She is here with her husband and an interpretor. Patient gives a longstanding history of postprandial bloating, gas and altered bowel habits consisting of alternating constipaion / diarrhea. Approximately 3 weeks ago patient began having having epigastric pain radiating to LUQ. This pain is only associated with eating. PCP prescribed Levsin, it offers some relief. She is on chronic PPI therapy. No NSAID use. Her weight is stable in EPIC  Patient reports a colonoscopy with removal of a polyp approximately three years ago at an outside facility.   Past Medical History  Diagnosis Date  . Hypertension   . Hyperlipidemia   . Back pain   . GERD (gastroesophageal reflux disease)   . Colon polyps     Family History  Problem Relation Age of Onset  . Aneurysm Mother   . Diabetes Father   . Colon cancer Brother 1   History  Substance Use Topics  . Smoking status: Never Smoker   . Smokeless tobacco: Never Used  . Alcohol Use: No   Current Outpatient Prescriptions  Medication Sig Dispense Refill  . ALPRAZolam (XANAX) 0.25 MG tablet Take 1 tablet (0.25 mg total) by mouth 3 (three) times daily as needed for anxiety. 30 tablet 0  . esomeprazole (NEXIUM) 40 MG capsule Take 40 mg by mouth daily at 12 noon.    . hyoscyamine (LEVSIN/SL) 0.125 MG SL tablet Place 1 tablet (0.125 mg total) under the tongue every 4 (four) hours as needed. 30 tablet 0  . metoprolol succinate (TOPROL-XL) 25 MG 24 hr tablet Take 0.5 tablets (12.5 mg total) by mouth every morning. 90 tablet 6  . NIFEdipine (PROCARDIA-XL/ADALAT CC) 30 MG 24 hr tablet TAKE 1 TABLET BY MOUTH EVERY DAY 30 tablet 0  . rosuvastatin (CRESTOR) 20 MG tablet Take 1 tablet (20 mg total) by mouth at bedtime. 30 tablet 3  . sucralfate (CARAFATE) 1 G tablet Take 1 tab before a meal. 3 times daily/ 90 tablet 1   No current facility-administered  medications for this visit.   Allergies  Allergen Reactions  . Aspirin Hives  . Nsaids Hives     Review of Systems: All systems reviewed and negative except where noted in HPI.   Physical Exam: BP 110/70 mmHg  Pulse 72  Ht 5\' 6"  (1.676 m)  Wt 197 lb (89.359 kg)  BMI 31.81 kg/m2 Constitutional: Pleasant,well-developed, female in no acute distress. HEENT: Normocephalic and atraumatic. Conjunctivae are normal. No scleral icterus. Neck supple.  Cardiovascular: Normal rate, regular rhythm.  Pulmonary/chest: Effort normal and breath sounds normal. No wheezing, rales or rhonchi. Abdominal: Soft, nondistended, moderate epigastric tenderness.  Bowel sounds active throughout. There are no masses palpable. No hepatomegaly. Extremities: no edema Lymphadenopathy: No cervical adenopathy noted. Neurological: Alert and oriented to person place and time. Skin: Skin is warm and dry. No rashes noted. Psychiatric: Normal mood and affect. Behavior is normal.   ASSESSMENT AND PLAN:   45. 59 year old female from [place] with 3 week history of postprandial epigastric pain. LFTs normal. No weight loss, a reassuring sign. Last ultrasound was back in April 2013 but was normal.   Will obtain lipase.   Continue daily PPI. Trial of Carafate.   For further evaluation patient will be scheduled for EGD. The benefits, risks, and potential complications of EGD with possible biopsies were discussed with the patient and she agrees to proceed.   If  EGD negative and patient persists, she may need CTscan.   2. Chronic postprandial bloating and chronic alternating constipation / diarrhea. .Duspect IBS. Patient doesn't think she gets enough fiber. Will try daily Citrucel. .   3. Microcytosis, normal hgb. Probably not iron deficient, ? Hemoglobinopathy.   4. HTN. Followed by Cardiology. She was evaluated by Cardiology in August 2015 for atypical chest pain. No major findings on echo.     Addendum: After  looking deeper into EPIC records i located some office notes by Dr. Deatra Ina. Patient was seen by him 6-7 years ago for bloating and upper abdominal pain. EGD recommended but patient cancelled because of high copay. Will change Primary MD from Dr. Ardis Hughs to Dr. Deatra Ina.and move EGD as well.    Addendum 02/24/15:  Received records from [facility] patient had a colonoscopy for abdominal pain and bowel changes by Dr. Paulita Fujita March 2013. Prep was fair, extent of exam to the cecum. Findings included a sessile polyp in the ascending colon measuring 2 mm. Remaining exam unremarkable. Colon path compatible with inflammatory polyp. Repeat colonoscopy recommended in 5 years. I will send results to the patient's primary gastroenterologist, Dr. Ardis Hughs, to make sure he agrees with a 5 year recall.

## 2015-02-14 ENCOUNTER — Telehealth: Payer: Self-pay | Admitting: Gastroenterology

## 2015-02-14 DIAGNOSIS — R194 Change in bowel habit: Secondary | ICD-10-CM | POA: Insufficient documentation

## 2015-02-14 DIAGNOSIS — R140 Abdominal distension (gaseous): Secondary | ICD-10-CM | POA: Insufficient documentation

## 2015-02-14 DIAGNOSIS — R1012 Left upper quadrant pain: Secondary | ICD-10-CM | POA: Insufficient documentation

## 2015-02-14 NOTE — Progress Notes (Signed)
Reviewed and agree with management.  If patient has not had a colonoscopy in the last 10 years she should be scheduled for that as well Herbie Baltimore D. Deatra Ina, M.D., [facility]

## 2015-02-15 NOTE — Telephone Encounter (Signed)
I spoke with Abigail Butts the patient's daughter she is notified of the results

## 2015-02-22 ENCOUNTER — Encounter: Payer: Self-pay | Admitting: Gastroenterology

## 2015-02-22 ENCOUNTER — Ambulatory Visit (AMBULATORY_SURGERY_CENTER): Payer: 59 | Admitting: Gastroenterology

## 2015-02-22 VITALS — BP 120/81 | HR 61 | Temp 96.4°F | Resp 16 | Ht 66.0 in | Wt 197.0 lb

## 2015-02-22 DIAGNOSIS — K2900 Acute gastritis without bleeding: Secondary | ICD-10-CM

## 2015-02-22 DIAGNOSIS — K2990 Gastroduodenitis, unspecified, without bleeding: Secondary | ICD-10-CM

## 2015-02-22 DIAGNOSIS — R1012 Left upper quadrant pain: Secondary | ICD-10-CM

## 2015-02-22 DIAGNOSIS — K2970 Gastritis, unspecified, without bleeding: Secondary | ICD-10-CM

## 2015-02-22 MED ORDER — SODIUM CHLORIDE 0.9 % IV SOLN: 500.0000 mL | INTRAVENOUS | Status: DC

## 2015-02-22 NOTE — Patient Instructions (Signed)
YOU HAD AN ENDOSCOPIC PROCEDURE TODAY AT [facility]:   Refer to the procedure report that was given to you for any specific questions about what was found during the examination.  If the procedure report does not answer your questions, please call your gastroenterologist to clarify.  If you requested that your care partner not be given the details of your procedure findings, then the procedure report has been included in a sealed envelope for you to review at your convenience later.  YOU SHOULD EXPECT: Some feelings of bloating in the abdomen. Passage of more gas than usual.  Walking can help get rid of the air that was put into your GI tract during the procedure and reduce the bloating. If you had a lower endoscopy (such as a colonoscopy or flexible sigmoidoscopy) you may notice spotting of blood in your stool or on the toilet paper. If you underwent a bowel prep for your procedure, you may not have a normal bowel movement for a few days.  Please Note:  You might notice some irritation and congestion in your nose or some drainage.  This is from the oxygen used during your procedure.  There is no need for concern and it should clear up in a day or so.  SYMPTOMS TO REPORT IMMEDIATELY:    Following upper endoscopy (EGD)  Vomiting of blood or coffee ground material  New chest pain or pain under the shoulder blades  Painful or persistently difficult swallowing  New shortness of breath  Fever of 100F or higher  Black, tarry-looking stools  A gastroenterologist can be reached at any hour by calling [phone].   DIET: Your first meal following the procedure should be a small meal and then it is ok to progress to your normal diet. Heavy or fried foods are harder to digest and may make you feel nauseous or bloated.  Likewise, meals heavy in dairy and vegetables can increase bloating.  Drink plenty of fluids but you should avoid alcoholic beverages for 24 hours.  ACTIVITY:  You  should plan to take it easy for the rest of today and you should NOT DRIVE or use heavy machinery until tomorrow (because of the sedation medicines used during the test).    FOLLOW UP: Our staff will call the number listed on your records the next business day following your procedure to check on you and address any questions or concerns that you may have regarding the information given to you following your procedure. If we do not reach you, we will leave a message.  However, if you are feeling well and you are not experiencing any problems, there is no need to return our call.  We will assume that you have returned to your regular daily activities without incident.  If any biopsies were taken you will be contacted by phone or by letter within the next 1-3 weeks.  Please call us at [phone] if you have not heard about the biopsies in 3 weeks.    SIGNATURES/CONFIDENTIALITY: You and/or your care partner have signed paperwork which will be entered into your electronic medical record.  These signatures attest to the fact that that the information above on your After Visit Summary has been reviewed and is understood.  Full responsibility of the confidentiality of this discharge information lies with you and/or your care-partner.

## 2015-02-22 NOTE — Op Note (Signed)
[place]  [address] [place], [address]   ENDOSCOPY PROCEDURE REPORT  PATIENT: Emily, [name]  MR#: [id number] BIRTHDATE: 15-Dec-1956 , 56  yrs. old GENDER: female ENDOSCOPIST: Milus Banister, MD REFERRED BY:  Freda Munro, M.D. PROCEDURE DATE:  02/22/2015 PROCEDURE:  EGD w/ biopsy ASA CLASS:     Class II INDICATIONS:  LUQ pain, dyspepsia. MEDICATIONS: Monitored anesthesia care and Propofol 200 mg IV TOPICAL ANESTHETIC: none  DESCRIPTION OF PROCEDURE: After the risks benefits and alternatives of the procedure were thoroughly explained, informed consent was obtained.  The LB [id number] endoscope was introduced through the mouth and advanced to the second portion of the duodenum , Without limitations.  The instrument was slowly withdrawn as the mucosa was fully examined.  There was mild, non-specific distal gastritis.  This was biopsied and sent to pathology.  There was a 3cm hiatal hernia.  The examination was otherwise normal.  Retroflexed views revealed no abnormalities.     The scope was then withdrawn from the patient and the procedure completed. COMPLICATIONS: There were no immediate complications.  ENDOSCOPIC IMPRESSION: There was mild, non-specific distal gastritis.  This was biopsied and sent to pathology.  There was a 3cm hiatal hernia.  The examination was otherwise normal  RECOMMENDATIONS: Await final pathology.  If biopsies show H.  pylori, you will be started on appropriate antibiotics.   eSigned:  Milus Banister, MD 02/22/2015 8:53 AM    CC: Erskine Emery, MD

## 2015-02-22 NOTE — Progress Notes (Signed)
Report to PACU, RN, vss, BBS= Clear.  

## 2015-02-22 NOTE — Progress Notes (Signed)
Called to room to assist during endoscopic procedure.  Patient ID and intended procedure confirmed with present staff. Received instructions for my participation in the procedure from the performing physician.  

## 2015-02-23 ENCOUNTER — Telehealth: Payer: Self-pay

## 2015-02-23 NOTE — Telephone Encounter (Signed)
  Follow up Call-  Call back number 02/22/2015  Post procedure Call Back phone  # [phone]  Permission to leave phone message Yes     Patient questions:  Do you have a fever, pain , or abdominal swelling? No. Pain Score  0 *  Have you tolerated food without any problems? Yes.    Have you been able to return to your normal activities? Yes.    Do you have any questions about your discharge instructions: Diet   No. Medications  No. Follow up visit  No.  Do you have questions or concerns about your Care? No.  Actions: * If pain score is 4 or above: No action needed, pain <4.   No problems per the pt's daughter, Ezequiel Essex. maw

## 2015-02-24 NOTE — Progress Notes (Signed)
5 year recall from her March 2013 colonsocopy sounds reasonable.  Thanks

## 2015-03-01 ENCOUNTER — Encounter: Payer: Self-pay | Admitting: Gastroenterology

## 2015-04-22 ENCOUNTER — Other Ambulatory Visit: Payer: Self-pay | Admitting: Family Medicine

## 2015-04-28 ENCOUNTER — Other Ambulatory Visit: Payer: Self-pay | Admitting: Nurse Practitioner

## 2015-05-22 ENCOUNTER — Ambulatory Visit (INDEPENDENT_AMBULATORY_CARE_PROVIDER_SITE_OTHER): Payer: 59 | Admitting: Family Medicine

## 2015-05-22 ENCOUNTER — Ambulatory Visit (INDEPENDENT_AMBULATORY_CARE_PROVIDER_SITE_OTHER): Payer: 59

## 2015-05-22 VITALS — BP 100/70 | HR 75 | Temp 99.1°F | Resp 16 | Ht 64.75 in | Wt 193.2 lb

## 2015-05-22 DIAGNOSIS — K219 Gastro-esophageal reflux disease without esophagitis: Secondary | ICD-10-CM

## 2015-05-22 DIAGNOSIS — I10 Essential (primary) hypertension: Secondary | ICD-10-CM

## 2015-05-22 DIAGNOSIS — E785 Hyperlipidemia, unspecified: Secondary | ICD-10-CM

## 2015-05-22 DIAGNOSIS — M25561 Pain in right knee: Secondary | ICD-10-CM

## 2015-05-22 DIAGNOSIS — K449 Diaphragmatic hernia without obstruction or gangrene: Secondary | ICD-10-CM

## 2015-05-22 DIAGNOSIS — R079 Chest pain, unspecified: Secondary | ICD-10-CM

## 2015-05-22 DIAGNOSIS — F419 Anxiety disorder, unspecified: Secondary | ICD-10-CM | POA: Diagnosis not present

## 2015-05-22 DIAGNOSIS — Z789 Other specified health status: Secondary | ICD-10-CM | POA: Diagnosis not present

## 2015-05-22 LAB — LIPID PANEL
Cholesterol: 166 mg/dL (ref 0–200)
HDL: 49 mg/dL (ref >=46)
LDL Cholesterol: 94 mg/dL (ref 0–99)
TRIGLYCERIDES: 117 mg/dL (ref <150)
Total CHOL/HDL Ratio: 3.4 Ratio
VLDL: 23 mg/dL (ref 0–40)

## 2015-05-22 LAB — COMPLETE METABOLIC PANEL WITH GFR
ALT: 20 U/L (ref 0–35)
AST: 18 U/L (ref 0–37)
Albumin: 4.2 g/dL (ref 3.5–5.2)
Alkaline Phosphatase: 125 U/L — ABNORMAL HIGH (ref 39–117)
BILIRUBIN TOTAL: 0.3 mg/dL (ref 0.2–1.2)
BUN: 13 mg/dL (ref 6–23)
CO2: 29 meq/L (ref 19–32)
Calcium: 10 mg/dL (ref 8.4–10.5)
Chloride: 102 mEq/L (ref 96–112)
Creat: 0.62 mg/dL (ref 0.50–1.10)
GFR, Est African American: >89 mL/min
GFR, Est Non African American: >89 mL/min
Glucose, Bld: 83 mg/dL (ref 70–99)
Potassium: 4.2 mEq/L (ref 3.5–5.3)
Sodium: 139 mEq/L (ref 135–145)
TOTAL PROTEIN: 7.6 g/dL (ref 6.0–8.3)

## 2015-05-22 MED ORDER — ROSUVASTATIN CALCIUM 20 MG PO TABS: ORAL_TABLET | ORAL | Status: DC

## 2015-05-22 MED ORDER — ESOMEPRAZOLE MAGNESIUM 40 MG PO CPDR: 40.0000 mg | DELAYED_RELEASE_CAPSULE | Freq: Every day | ORAL | Status: DC

## 2015-05-22 MED ORDER — TRAMADOL HCL 50 MG PO TABS: 50.0000 mg | ORAL_TABLET | Freq: Four times a day (QID) | ORAL | Status: DC | PRN

## 2015-05-22 MED ORDER — METOPROLOL SUCCINATE ER 25 MG PO TB24: 12.5000 mg | ORAL_TABLET | Freq: Every morning | ORAL | Status: DC

## 2015-05-22 MED ORDER — ALPRAZOLAM 0.25 MG PO TABS: 0.2500 mg | ORAL_TABLET | Freq: Three times a day (TID) | ORAL | Status: DC | PRN

## 2015-05-22 MED ORDER — NIFEDIPINE ER 30 MG PO TB24: 30.0000 mg | ORAL_TABLET | Freq: Every day | ORAL | Status: DC

## 2015-05-22 NOTE — Patient Instructions (Addendum)
Xanax si necesario, pero si tome en mas frequencia o necesita cada dia - regrese hablar mas de su nervios.   You should receive a call or letter about your lab results within the next week to 10 days.   Tylenol y aparato por su rodilla. voy a referir a specialista.  si necesario por mas fuerte dolor - tramadol (pero sueno y [place] con [place]). Si empeorse - regrese a Air traffic controller o departmento de Freight forwarder.   si mas siptomas en su pecho o dolor - regrese aqui o cuarto de emergencia.    Opciones de alimentos para pacientes con reflujo gastroesofgico (Food Choices for Gastroesophageal Reflux Disease) Cuando se tiene reflujo gastroesofgico (ERGE), los alimentos que se ingieren y los hbitos de alimentacin son muy importantes. Elegir los alimentos adecuados puede ayudar a Public house manager las molestias ocasionadas por el [place]. [place]?  Elija las frutas, los vegetales, los cereales integrales, los productos lcteos, la carne de [place], de pescado y de ave con bajo contenido de grasas.  Limite las grasas, como los [place], los aderezos para [place], la [place], los frutos secos y Publishing copy.  Lleve un registro de las comidas para identificar los alimentos que ocasionan sntomas.  Evite los alimentos que le ocasionen reflujo. Pueden ser distintos para cada persona.  Haga comidas pequeas con frecuencia en lugar de tres comidas Kellogg.  Coma lentamente, en un clima distendido.  Limite el consumo de alimentos fritos.  Cocine los alimentos utilizando mtodos que no sean la fritura.  Evite el consumo alcohol.  Evite beber grandes cantidades de lquidos con las comidas.  Evite agacharse o recostarse hasta despus de 2 o 3horas de haber comido. QU ALIMENTOS NO SE RECOMIENDAN? Los siguientes son algunos alimentos y bebidas que pueden empeorar los sntomas: Astronomer. Jugo de tomate. Salsa de tomate y espagueti. Ajes. Cebolla y [place].  Rbano picante. Frutas Naranjas, pomelos y limn (fruta y [place]). Carnes Carnes de [place], de pescado y de ave con gran contenido de grasas. Esto incluye los perros calientes, las [place], el Cecilia, la salchicha, el salame y el tocino. Lcteos Leche entera y [place]. [employer]. Crema. [place]. Helados. Queso crema.  Bebidas Caf y t negro, con o sin cafena Bebidas gaseosas o energizantes. Condimentos Salsa picante. Salsa barbacoa.  Dulces/postres Chocolate y cacao. [place]illas. Menta y mentol. Grasas y Unisys Corporation con alto contenido de grasas, incluidas las papas fritas. Otros [place]. Especias picantes, como la Solectron Corporation, la pimienta blanca, la pimienta roja, la pimienta de cayena, el curry en [place], los clavos de [place], el jengibre y el [place] en polvo. Los artculos mencionados arriba pueden no ser Dean Foods Company de las bebidas y los alimentos que se Higher education careers adviser. Comunquese con el nutricionista para recibir ms informacin. Document Released: 09/19/2005 Document Revised: 12/15/2013 [facility] Patient Information 2015 [place]. This information is not intended to replace advice given to you by your health care provider. Make sure you discuss any questions you have with your health care provider.  Dolor en la rodilla (Knee Pain) La rodilla es la articulacin compleja entre el muslo y la parte inferior de la pierna. En esta articulacin hay huesos, tendones, ligamentos y Database administrator. Los huesos que forman la rodilla son:  El fmur en el muslo.  La tibia y el peron en la pierna.  La rtula montada en la ranura de la parte inferior del muslo. CAUSAS El dolor de rodilla es una causa frecuente de [place]  y puede tener varias causas. Algunas son:  Lesiones como:  Ruptura de ligamento o lesin en el tendn.  Esguince del cartlago  Enfermedades como:  Gota.  Artritis.  Infecciones.  Uso excesivo, demasiado entrenamiento o mucha actividad  fsica. El dolor de rodilla puede ser leve o intenso. Puede acompaar una lesin debilitante. Los problemas leves con frecuencia responden bien a tratamientos caseros o se mejoran por s mismas. Las lesiones ms graves pueden requerir la intervencin del mdico y Rennis Petty. SNTOMAS La rodilla es una articulacin compleja. Los sntomas pueden variar ampliamente Algunos son:  Dolor con el movimiento o al soportar peso.  Hinchazn y Social research officer, government.  Torsin de la rodilla.  Imposibilidad para estirar la rodilla.  La rodilla se traba y no puede enderezarla.  Siente calor y se observa enrojecimiento con dolor y [place].  Deformidad o dislocacin de la rtula. DIAGNSTICO Determinar cual es el problema puede ser bastante simple, como cuando hay una lesin. Tambin puede ser Ameren Corporation debido a la complejidad de la rodilla. Las pruebas para Optometrist un diagnstico son:  Shirleen Schirmer y examen fsico por parte del mdico.  Radiografas para descartar otros problemas. Las radiografas no mostrarn la ruptura del Database administrator. Algunas lesiones en la rodilla pueden diagnosticarse del siguiente modo:  La artroscopia es una tcnica quirrgica por la que una pequea cmara de vdeo se inserta en pequeas incisiones que se hacen a los lados de la rodilla. Este procedimiento se [place] para examinar y Psychiatric nurse los problemas de la articulacin interna de la rodilla. Se utilizan pequeos instrumentos para reparar el cartlago roto (meniscos).  La artrografa es una tcnica radiolgica. Se inyecta un lquido de contraste en la articulacin de la rodilla. Las estructuras internas de la articulacin de la rodilla se hacen visibles en una pelcula de rayos X.  Las imgenes por resonancia magntica son un procedimiento en el que los campos magnticos y una computadora producen imgenes en dos o tres dimensiones del interior de la rodilla. La ruptura del cartlago es visible con esta tcnica. La resonancia  magntica ha reemplazado a la artrografa en el diagnstico de la ruptura del cartlago de la rodilla.  Anlisis de [place].  Examen del lquido que lubrica la articulacin de la rodilla (lquido sinovial). Se realiza tomando [place] con [place]. TRATAMIENTO El tratamiento de los problemas de la rodilla depende fundamentalmente de la causa. Algunos de estos tratamientos son:  Segn sea la lesin, un yeso o entablillado, ciruga o fisioterapia.  Permtase el tiempo adecuado de recuperacin. No use demasiado su extremidad lesionada. Si siente dolor durante los ejercicios de rutina, suspndalos. Hgalos ms lentos o realice menos repeticiones.  En el caso de actividades repetitivas como andar en bicicleta o correr, mantenga la fuerza y Ardelia Mems buena nutricin.  Alterne los grupos musculares. Por ejemplo, si levanta pesas, trabaje la parte superior del cuerpo Optician, dispensing, y la parte inferior al da siguiente.  Ni los msculos firmes ni los dbiles proporcionan un sostn adecuado a la rodilla. Los msculos no absorben el estrs que se ejerce sobre la articulacin de la rodilla. Mantenga fuertes los msculos que rodean a la rodilla.  Cudese de los problemas mecnicos:  Si tiene pie plano, los zapatos ortopdicos o especiales pueden ayudar. Comunquese con el profesional que lo asiste si necesita ayuda adicional.  Los soporte de arco con bordes en la zona interna o interna del taco pueden ayudar. Cambian la presin del lado de la rodilla ms comprometido por la osteoartritis.  Podrn colocarle una ortesis de rodilla para aliviar la presin en la zona ms artrtica de la rodilla.  Si el profesional le ha prescripto muletas, ortesis, un vendaje o hielo, hgalo segn las indicaciones. El acrnimo para este tratamiento es PRICE. Significa proteccin, reposo, hielo, compresin y elevacin.  Los antiinflamatorios no esteroides, pueden ayudar a Best boy. Pero si se toman inmediatamente  luego de la lesin, podran aumentar la hinchazn. Tome los corticoides luego de Soil scientist. Suspndalos si tiene problemas estomacales. No los tome si tiene una historia de Aeronautical engineer, Social research officer, government en el estmago o hemorragia intestinal. No lo tome sin la aprobacin del profesional que la asiste si tiene problemas de retencin de lquidos, insuficencia cardaca o problemas renales.  En los casos crnicos, la fisioterapia puede ser de [place].  La glucosamina y el condroitin son suplementos dietarios de Radio broadcast assistant. Ambos pueden Best boy de la osteoartritis de la rodilla. Estos medicamentos son diferentes de los antiinflamatorios habituales. La glucosamina puede disminuir el porcentaje de destruccin del cartlago.  Las inyecciones de corticoides en la articulacin de la rodilla reducen los sntomas de un brote de artritis. Ofrecen alivio que dura algunos meses. Hay que esperar algunos meses entre la aplicacin de inyecciones. Las inyecciones tiene un pequeo riesgo de infeccin, retencin de lquidos y Agricultural consultant de los niveles de Museum/gallery exhibitions officer.  El cido hialurnico inyectado en las articulaciones lesionadas puede aliviar el dolor y proporciona lubricacin. Estas inyecciones funcionan bien reduciendo la inflamacin. Una serie de inyecciones puede proporcionar alivio durante seis meses.  [place]. Aplicar ciertos ungentos sobre la piel puede ayudar a Best boy y la rigidez de la osteoartritis. Consulte con el farmacutico, si es necesario. Muchos medicamentos de venta libre estn aprobados para el alivio temporario del dolor artrtico.  En algunos pases los mdicos prescriben antiinflamatorios no esteroides para el alivio de los trastornos crnicos como la artritis y la tendinitis. Un estudio del tratamiento con antiinflamatorios no esteroides aplicados en crema, demostr que funcionaban bien, as como administrados por va oral, pero sin el peligro de los [place]. PREVENCIN  Mantenga un peso normal. Los kilos de ms agregan tensin a las articulaciones.  Mantngase fuerte y gil. Los msculos dbiles son Ardelia Mems causa frecuente de lesiones en la rodilla. La elongacin es importante. Incluya ejercicios de flexibilidad en sus rutinas.  Practique actividad fsica con inteligencia. Si sufre osteoartritis, dolor crnico en la rodilla o lesiones recurrentes, podr ser necesario que modifique el modo en que se ejercita. No significa que deba volverse inactivo. Si le duelen las rodillas despus de correr o jugar basketball, considere la prctica de la natacin, ejercicios aerbicos en el agua u otras actividades de bajo [place], al menos durante algunos das o [employer]. En algunos casos, el Kellogg actividades de alto impacto ofrece [place].  Asegrese que sus zapatos le [place]. Elija el calzado deportivo adecuado para su deporte.  Proteja sus rodillas. Use la proteccin adecuada para las actividades que puedan afectar a sus rodillas. Use rodilleras cuando juegue al vley o se arrodille. Colquese el cinturn de seguridad cada vez que conduzca. La mayor parte de las fracturas de rtula ocurren en accidentes automvilsticos.  Descanse cuando se sienta cansado. SOLICITE ATENCIN MDICA SI: Tiene dolor en la rodilla que es continuo y no parece mejorar.  SOLICITE ATENCIN MDICA DE INMEDIATO SI:  La articulacin de la rodilla se siente caliente al tacto y usted tiene fiebre. EST SEGURO QUE:   Comprende las instrucciones  para el alta mdica.  Controlar su enfermedad.  Solicitar atencin mdica de inmediato segn las indicaciones. Document Released: 05/28/2008 Document Revised: 03/03/2012 [facility] Patient Information 2015 [place]. This information is not intended to replace advice given to you by your health care provider. Make sure you discuss any questions you have with your health care provider.

## 2015-05-22 NOTE — Progress Notes (Addendum)
Subjective:  This chart was scribed for [name], MD by Moises Blood, Medical Scribe. This patient was seen in room 8 and the patient's care was started 1:09 PM.    Patient ID: Emily [name], female    DOB: 08-11-1956, 59 y.o.   MRN: [id number]  HPI Emily [name] is a 59 y.o. female  She is here for refill for all her medications as well as right knee pain. She was last seen by myself in February 2015 but has seen multiple providers since that time for various concerns.   Only thing she's eaten today was a banana.   Hypertension She takes 12.5 mg TOPROL-XL QD.  She takes 30 mg PROCARDIA-XL QD.  Her kidney function was normal in February of this year.     Hyperlipidemia She takes 20 mg CRESTOR QD.  She checks her blood pressure at home with high of 135 and lowest 110/68.  She has occasional headaches and some chest pressure.  She only notices chest pressure when her blood pressure is lower and it happens sporadically about once a month. And this chest pressure only lasts a few seconds and goes away very quickly. This has happened for at least a year. She had a work up with hiatal hernia. She had normal stress test and echo about a year ago. She has less chest pain when she takes the Ovid.    Lab Results  Component Value Date   CHOL 164 12/19/2014   HDL 38* 12/19/2014   LDLCALC 92 12/19/2014   LDLDIRECT 174* 10/02/2012   TRIG 169* 12/19/2014   CHOLHDL 4.3 12/19/2014   Heart Burn  She takes 40 mg NEXIUM QD.    Anxiety She has occasional anxiety, which happens about once a week. She only takes XANAX when it happens.  She had worse anxiety in the past but it's been better recently.    Right Knee Pain She has sudden onset right knee pain for a month. She fell and twisted it and since then has felt pain. She is able to bear weight, but it hurts to ambulate. She limps when she is at work and notices more pain when she's working. She denies swelling.    She takes Tylenol only when the pain is severe and applies icy-hot at times.      Patient Active Problem List   Diagnosis Date Noted  . LUQ abdominal pain 02/14/2015  . Bloating 02/14/2015  . Altered bowel habits 02/14/2015  . Anxiety 09/08/2014  . Chest pain 08/11/2014  . HTN (hypertension) 10/03/2012  . Hyperlipidemia 10/03/2012  . DYSPEPSIA&OTHER [facility] DISORDERS FUNCTION STOMACH 07/20/2009   Past Medical History  Diagnosis Date  . Hypertension   . Hyperlipidemia   . Back pain   . GERD (gastroesophageal reflux disease)   . Colon polyps    Past Surgical History  Procedure Laterality Date  . Abdominal hysterectomy     Allergies  Allergen Reactions  . Aspirin Hives  . Nsaids Hives   Prior to Admission medications   Medication Sig Start Date End Date Taking? Authorizing Provider  ALPRAZolam (XANAX) 0.25 MG tablet Take 1 tablet (0.25 mg total) by mouth 3 (three) times daily as needed for anxiety. 12/19/14  Yes Robyn Haber, MD  esomeprazole (NEXIUM) 40 MG capsule Take 40 mg by mouth daily at 12 noon.   Yes Historical Provider, MD  metoprolol succinate (TOPROL-XL) 25 MG 24 hr tablet Take 0.5 tablets (12.5 mg total) by mouth every morning. 12/31/14  Yes Dorothy Spark, MD  NIFEdipine (PROCARDIA-XL/ADALAT CC) 30 MG 24 hr tablet TAKE 1 TABLET BY MOUTH EVERY DAY 02/04/15  Yes Dorothy Spark, MD  rosuvastatin (CRESTOR) 20 MG tablet TAKE 1 TABLET BY MOUTH EVERY DAY.  "OV NEEDED FOR ADDITIONAL REFILLS" 04/25/15  Yes Mancel Bale, PA-C   History   Social History  . Marital Status: Married    Spouse Name: [name]  . Number of Children: 3  . Years of Education: [name]   Occupational History  . Not on file.   Social History Main Topics  . Smoking status: Never Smoker   . Smokeless tobacco: Never Used  . Alcohol Use: No  . Drug Use: No  . Sexual Activity: Not on file   Other Topics Concern  . Not on file   Social History Narrative        Review of Systems   Cardiovascular: Positive for chest pain.  Musculoskeletal: Positive for arthralgias (right knee pain). Negative for joint swelling.       Objective:   Physical Exam  Constitutional: She is oriented to person, place, and time. She appears well-developed and well-nourished.  HENT:  Head: Normocephalic and atraumatic.  Eyes: Conjunctivae and EOM are normal. Pupils are equal, round, and reactive to light.  Neck: Carotid bruit is not present.  Cardiovascular: Normal rate, regular rhythm, normal heart sounds and intact distal pulses.   Pulmonary/Chest: Effort normal and breath sounds normal.  Slight chest wall pain when pressed  Abdominal: Soft. She exhibits no pulsatile midline mass. There is no tenderness.  Musculoskeletal:  Right knee crepitance Tender over patella and over both joint lines  Slight difficulty with body habitance but no apparent swelling in right knee Guarded with varus and valgus testing and McMurray testing  NVI distally DP pulse 2+   No lower extremity edema   Neurological: She is alert and oriented to person, place, and time.  Skin: Skin is warm and dry.  Skin intact  Psychiatric: She has a normal mood and affect. Her behavior is normal.  Vitals reviewed.   Filed Vitals:   05/22/15 1136  BP: 100/70  Pulse: 75  Temp: 99.1 F (37.3 C)  TempSrc: Oral  Resp: 16  Height: 5' 4.75" (1.645 m)  Weight: 193 lb 4 oz (87.658 kg)  SpO2: 97%    [facility] reading (PRIMARY) by  Dr.Syncere Eble: R knee: degenerative changes, no apparent fracture.       Assessment & Plan:   Emily [name] is a 59 y.o. female Chest pain, unspecified chest pain type - Plan: EKG 12-Lead  - associated with prior GERD/hiatal hernia and much improved on Nexium. Rtc/er precautions.   Essential hypertension - Plan: EKG 12-Lead, COMPLETE METABOLIC PANEL WITH GFR, NIFEdipine (PROCARDIA-XL/ADALAT CC) 30 MG 24 hr tablet, metoprolol succinate (TOPROL-XL) 25 MG 24 hr tablet  -controlled. No med  changes. Refilled.   Hyperlipidemia - Plan: COMPLETE METABOLIC PANEL WITH GFR, Lipid panel, rosuvastatin (CRESTOR) 20 MG tablet   -lipids, CMP pending.   Right knee pain - Plan: DG Knee Complete 4 Views Right, Ambulatory referral to Orthopedic Surgery, traMADol (ULTRAM) 50 MG tablet  -suspected DJD vs degenerative meniscus tear.  Fitted with hinged knee brace, tylenol or Ultram if needed for more severe pain (SED) and refer to ortho.   Hiatal hernia - gastroesophageal reflux disease, esophagitis presence not specified - Plan: esomeprazole (NEXIUM) 40 MG capsule refilled.   Anxiety - Plan: ALPRAZolam (XANAX) 0.25 MG tablet   -discussed  possible need to look at other meds if frequent use of XAnax. rtc if this is the case. Refilled with #30 xanax.    Language barrier - spanish spoken and understanding expressed. Other provider fluent in [place] assisted with portion of history. Also gave her his name if needed for follow up or establishing primary provider at [facility].    Meds ordered this encounter  Medications  . rosuvastatin (CRESTOR) 20 MG tablet    Sig: TAKE 1 TABLET BY MOUTH EVERY DAY.    Dispense:  90 tablet    Refill:  1  . NIFEdipine (PROCARDIA-XL/ADALAT CC) 30 MG 24 hr tablet    Sig: Take 1 tablet (30 mg total) by mouth daily.    Dispense:  90 tablet    Refill:  1  . metoprolol succinate (TOPROL-XL) 25 MG 24 hr tablet    Sig: Take 0.5 tablets (12.5 mg total) by mouth every morning.    Dispense:  90 tablet    Refill:  1  . esomeprazole (NEXIUM) 40 MG capsule    Sig: Take 1 capsule (40 mg total) by mouth daily at 12 noon.    Dispense:  90 capsule    Refill:  1  . ALPRAZolam (XANAX) 0.25 MG tablet    Sig: Take 1 tablet (0.25 mg total) by mouth 3 (three) times daily as needed for anxiety.    Dispense:  30 tablet    Refill:  0  . traMADol (ULTRAM) 50 MG tablet    Sig: Take 1 tablet (50 mg total) by mouth every 6 (six) hours as needed.    Dispense:  20 tablet    Refill:  0    Patient Instructions  Xanax si necesario, pero si tome en mas frequencia o necesita cada dia - regrese hablar mas de su nervios.   You should receive a call or letter about your lab results within the next week to 10 days.   Tylenol y aparato por su rodilla. voy a referir a specialista.  si necesario por mas fuerte dolor - tramadol (pero sueno y [place] con [place]). Si empeorse - regrese a Air traffic controller o departmento de Freight forwarder.   si mas siptomas en su pecho o dolor - regrese aqui o cuarto de emergencia.    Opciones de alimentos para pacientes con reflujo gastroesofgico (Food Choices for Gastroesophageal Reflux Disease) Cuando se tiene reflujo gastroesofgico (ERGE), los alimentos que se ingieren y los hbitos de alimentacin son muy importantes. Elegir los alimentos adecuados puede ayudar a Public house manager las molestias ocasionadas por el [place]. [place]?  Elija las frutas, los vegetales, los cereales integrales, los productos lcteos, la carne de [place], de pescado y de ave con bajo contenido de grasas.  Limite las grasas, como los [place], los aderezos para [place], la [place], los frutos secos y Publishing copy.  Lleve un registro de las comidas para identificar los alimentos que ocasionan sntomas.  Evite los alimentos que le ocasionen reflujo. Pueden ser distintos para cada persona.  Haga comidas pequeas con frecuencia en lugar de tres comidas Kellogg.  Coma lentamente, en un clima distendido.  Limite el consumo de alimentos fritos.  Cocine los alimentos utilizando mtodos que no sean la fritura.  Evite el consumo alcohol.  Evite beber grandes cantidades de lquidos con las comidas.  Evite agacharse o recostarse hasta despus de 2 o 3horas de haber comido. QU ALIMENTOS NO SE RECOMIENDAN? Los siguientes son algunos alimentos y bebidas que pueden  empeorar los sntomas: Astronomer. Jugo de tomate. Salsa de tomate y espagueti.  Ajes. Cebolla y Kindred. Rbano picante. Frutas Naranjas, pomelos y limn (fruta y [place]). Carnes Carnes de [place], de pescado y de ave con gran contenido de grasas. Esto incluye los perros calientes, las [place], el [place], la salchicha, el salame y el tocino. Lcteos [place]he entera y [place]. Rite Aid. Crema. [place]. Helados. Queso crema.  Bebidas Caf y t negro, con o sin cafena Bebidas gaseosas o energizantes. Condimentos Salsa picante. Salsa barbacoa.  Dulces/postres Chocolate y cacao. Rosquillas. Menta y mentol. Grasas y Unisys Corporation con alto contenido de grasas, incluidas las papas fritas. Otros [place]. Especias picantes, como la Solectron Corporation, la pimienta blanca, la pimienta roja, la pimienta de cayena, el curry en [place], los clavos de [place], el jengibre y el [place] en polvo. Los artculos mencionados arriba pueden no ser [employer] Foods [employer] de las bebidas y los alimentos que se Higher education careers adviser. Comunquese con el nutricionista para recibir ms informacin. Document Released: 09/19/2005 Document Revised: 12/15/2013 [facility] Patient Information 2015 [place]. This information is not intended to replace advice given to you by your health care provider. Make sure you discuss any questions you have with your health care provider.  Dolor en la rodilla (Knee Pain) La rodilla es la articulacin compleja entre el muslo y la parte inferior de la pierna. En esta articulacin hay huesos, tendones, ligamentos y Database administrator. Los huesos que forman la rodilla son:  El fmur en el muslo.  La tibia y el peron en la pierna.  La rtula montada en la ranura de la parte inferior del muslo. [place] es una causa frecuente de [place] y puede tener varias causas. Algunas son:  Lesiones como:  Ruptura de ligamento o lesin en el tendn.  Esguince del cartlago  Enfermedades como:  Gota.  Artritis.  Infecciones.  Uso excesivo, demasiado entrenamiento o  mucha actividad fsica. El dolor de rodilla puede ser leve o intenso. Puede acompaar una lesin debilitante. Los problemas leves con frecuencia responden bien a tratamientos caseros o se mejoran por s mismas. Las lesiones ms graves pueden requerir la intervencin del mdico y Rennis Petty. SNTOMAS La rodilla es una articulacin compleja. Los sntomas pueden variar ampliamente Algunos son:  Dolor con el movimiento o al soportar peso.  Hinchazn y Social research officer, government.  Torsin de la rodilla.  Imposibilidad para estirar la rodilla.  La rodilla se traba y no puede enderezarla.  Siente calor y se observa enrojecimiento con dolor y [place].  Deformidad o dislocacin de la rtula. DIAGNSTICO Determinar cual es el problema puede ser bastante simple, como cuando hay una lesin. Tambin puede ser Ameren Corporation debido a la complejidad de la rodilla. Las pruebas para Optometrist un diagnstico son:  Shirleen Schirmer y examen fsico por parte del mdico.  Radiografas para descartar otros problemas. Las radiografas no mostrarn la ruptura del Database administrator. Algunas lesiones en la rodilla pueden diagnosticarse del siguiente modo:  La artroscopia es una tcnica quirrgica por la que una pequea cmara de vdeo se inserta en pequeas incisiones que se hacen a los lados de la rodilla. Este procedimiento se [place] para examinar y Psychiatric nurse los problemas de la articulacin interna de la rodilla. Se utilizan pequeos instrumentos para reparar el cartlago roto (meniscos).  La artrografa es una tcnica radiolgica. Se inyecta un lquido de contraste en la articulacin de la rodilla. Las estructuras internas de la articulacin de la rodilla se hacen visibles en una pelcula  de rayos X.  Las imgenes por resonancia magntica son un procedimiento en el que los campos magnticos y una computadora producen imgenes en dos o tres dimensiones del interior de la rodilla. La ruptura del cartlago es visible con esta tcnica. La  resonancia magntica ha reemplazado a la artrografa en el diagnstico de la ruptura del cartlago de la rodilla.  Anlisis de [place].  Examen del lquido que lubrica la articulacin de la rodilla (lquido sinovial). Se realiza tomando [place] con [place]. TRATAMIENTO El tratamiento de los problemas de la rodilla depende fundamentalmente de la causa. Algunos de estos tratamientos son:  Segn sea la lesin, un yeso o entablillado, ciruga o fisioterapia.  Permtase el tiempo adecuado de recuperacin. No use demasiado su extremidad lesionada. Si siente dolor durante los ejercicios de rutina, suspndalos. Hgalos ms lentos o realice menos repeticiones.  En el caso de actividades repetitivas como andar en bicicleta o correr, mantenga la fuerza y Ardelia Mems buena nutricin.  Alterne los grupos musculares. Por ejemplo, si levanta pesas, trabaje la parte superior del cuerpo Optician, dispensing, y la parte inferior al da siguiente.  Ni los msculos firmes ni los dbiles proporcionan un sostn adecuado a la rodilla. Los msculos no absorben el estrs que se ejerce sobre la articulacin de la rodilla. Mantenga fuertes los msculos que rodean a la rodilla.  Cudese de los problemas mecnicos:  Si tiene pie plano, los zapatos ortopdicos o especiales pueden ayudar. Comunquese con el profesional que lo asiste si necesita ayuda adicional.  Los soporte de arco con bordes en la zona interna o interna del taco pueden ayudar. Cambian la presin del lado de la rodilla ms comprometido por la osteoartritis.  Podrn colocarle una ortesis de rodilla para aliviar la presin en la zona ms artrtica de la rodilla.  Si el profesional le ha prescripto muletas, ortesis, un vendaje o hielo, hgalo segn las indicaciones. El acrnimo para este tratamiento es PRICE. Significa proteccin, reposo, hielo, compresin y elevacin.  Los antiinflamatorios no esteroides, pueden ayudar a Best boy. Pero si se toman  inmediatamente luego de la lesin, podran aumentar la hinchazn. Tome los corticoides luego de Soil scientist. Suspndalos si tiene problemas estomacales. No los tome si tiene una historia de Aeronautical engineer, Social research officer, government en el estmago o hemorragia intestinal. No lo tome sin la aprobacin del profesional que la asiste si tiene problemas de retencin de lquidos, insuficencia cardaca o problemas renales.  En los casos crnicos, la fisioterapia puede ser de [place].  La glucosamina y el condroitin son suplementos dietarios de Radio broadcast assistant. Ambos pueden Best boy de la osteoartritis de la rodilla. Estos medicamentos son diferentes de los antiinflamatorios habituales. La glucosamina puede disminuir el porcentaje de destruccin del cartlago.  Las inyecciones de corticoides en la articulacin de la rodilla reducen los sntomas de un brote de artritis. Ofrecen alivio que dura algunos meses. Hay que esperar algunos meses entre la aplicacin de inyecciones. Las inyecciones tiene un pequeo riesgo de infeccin, retencin de lquidos y Agricultural consultant de los niveles de Museum/gallery exhibitions officer.  El cido hialurnico inyectado en las articulaciones lesionadas puede aliviar el dolor y proporciona lubricacin. Estas inyecciones funcionan bien reduciendo la inflamacin. Una serie de inyecciones puede proporcionar alivio durante seis meses.  [place]. Aplicar ciertos ungentos sobre la piel puede ayudar a Best boy y la rigidez de la osteoartritis. Consulte con el farmacutico, si es necesario. Muchos medicamentos de venta libre estn aprobados para el alivio temporario del dolor artrtico.  En algunos pases los mdicos prescriben antiinflamatorios no esteroides para el alivio de los trastornos crnicos como la artritis y la tendinitis. Un estudio del tratamiento con antiinflamatorios no esteroides aplicados en crema, demostr que funcionaban bien, as como administrados por va oral, pero sin el peligro de los [place]. PREVENCIN  Mantenga un peso normal. Los kilos de ms agregan tensin a las articulaciones.  Mantngase fuerte y gil. Los msculos dbiles son Ardelia Mems causa frecuente de lesiones en la rodilla. La elongacin es importante. Incluya ejercicios de flexibilidad en sus rutinas.  Practique actividad fsica con inteligencia. Si sufre osteoartritis, dolor crnico en la rodilla o lesiones recurrentes, podr ser necesario que modifique el modo en que se ejercita. No significa que deba volverse inactivo. Si le duelen las rodillas despus de correr o jugar basketball, considere la prctica de la natacin, ejercicios aerbicos en el agua u otras actividades de bajo [place], al menos durante algunos das o [employer]. En algunos casos, el Kellogg actividades de alto impacto ofrece [place].  Asegrese que sus zapatos le Edie. Elija el calzado deportivo adecuado para su deporte.  Proteja sus rodillas. Use la proteccin adecuada para las actividades que puedan afectar a sus rodillas. Use rodilleras cuando juegue al vley o se arrodille. Colquese el cinturn de seguridad cada vez que conduzca. La mayor parte de las fracturas de rtula ocurren en accidentes automvilsticos.  Descanse cuando se sienta cansado. SOLICITE ATENCIN MDICA SI: Tiene dolor en la rodilla que es continuo y no parece mejorar.  SOLICITE ATENCIN MDICA DE INMEDIATO SI:  La articulacin de la rodilla se siente caliente al tacto y usted tiene fiebre. EST SEGURO QUE:   Comprende las instrucciones para el alta mdica.  Controlar su enfermedad.  Solicitar atencin mdica de inmediato segn las indicaciones. Document Released: 05/28/2008 Document Revised: 03/03/2012 [facility] Patient Information 2015 [place]. This information is not intended to replace advice given to you by your health care provider. Make sure you discuss any questions you have with your health care provider.        I personally  performed the services described in this documentation, which was scribed in my presence. The recorded information has been reviewed and considered, and addended by me as needed.

## 2015-05-23 ENCOUNTER — Telehealth: Payer: Self-pay

## 2015-05-23 NOTE — Telephone Encounter (Signed)
PA needed for nexium. Called pt, who asked me to call daughter d/t language barrier. Daughter, Abigail Butts, reported that she thinks her mother tried prilosec and prevacid in the past. Also in chart that pt tried Aciphex originally. Completed PA on covermymeds.

## 2015-05-25 NOTE — Telephone Encounter (Signed)
PA was denied for esomeprazole because it is a plan exclusion so PA can not even be done on it. Dr Carlota Raspberry, do you want to try another medication for GERD?

## 2015-05-26 NOTE — Telephone Encounter (Signed)
LMOM for daughter to CB. If prilosec was effective we can Rx omeprazole. If not, we could try pantoprazole since Dr Carlota Raspberry OKd change to any PPI.

## 2015-05-26 NOTE — Telephone Encounter (Signed)
Any other PPI ok - can change to omeprazole if she feels this was effective for her symptoms.

## 2015-05-27 ENCOUNTER — Encounter: Payer: Self-pay | Admitting: *Deleted

## 2015-05-31 NOTE — Telephone Encounter (Signed)
LMOM for Emily [name] again to [facility].

## 2015-06-03 NOTE — Telephone Encounter (Signed)
Finally reached pt's daughter and she agreed to ask pt if any of the 3 medications listed below that she has tried in the past were effective. If not, we can try pantoprazole. Abigail Butts will call me back.

## 2015-06-13 ENCOUNTER — Encounter: Payer: Self-pay | Admitting: Family Medicine

## 2015-06-14 NOTE — Telephone Encounter (Signed)
Called Abigail Butts again and she advised pt is unsure of which one to try. I suggested that we may want to send in the Rx for the pantoprazole that she hasn't tried if pt is not sure that she wants to retry omeprazole or prevacid. Abigail Butts stated she will txt her mother now and let me know which to send in.

## 2015-07-13 MED ORDER — PANTOPRAZOLE SODIUM 40 MG PO TBEC: 40.0000 mg | DELAYED_RELEASE_TABLET | Freq: Every day | ORAL | Status: DC

## 2015-07-13 NOTE — Telephone Encounter (Signed)
[name] daughter never called back w/preference, so I will send in pantoprazole since daughter Minette Brine didn't think omeprazole was effective.

## 2015-09-24 ENCOUNTER — Ambulatory Visit (INDEPENDENT_AMBULATORY_CARE_PROVIDER_SITE_OTHER): Payer: 59 | Admitting: Family Medicine

## 2015-09-24 VITALS — BP 114/60 | HR 85 | Temp 98.7°F | Resp 16 | Ht 64.0 in | Wt 188.0 lb

## 2015-09-24 DIAGNOSIS — G8929 Other chronic pain: Secondary | ICD-10-CM

## 2015-09-24 DIAGNOSIS — M549 Dorsalgia, unspecified: Secondary | ICD-10-CM

## 2015-09-24 DIAGNOSIS — M546 Pain in thoracic spine: Secondary | ICD-10-CM

## 2015-09-24 DIAGNOSIS — K219 Gastro-esophageal reflux disease without esophagitis: Secondary | ICD-10-CM

## 2015-09-24 DIAGNOSIS — F419 Anxiety disorder, unspecified: Secondary | ICD-10-CM

## 2015-09-24 DIAGNOSIS — E785 Hyperlipidemia, unspecified: Secondary | ICD-10-CM

## 2015-09-24 DIAGNOSIS — I10 Essential (primary) hypertension: Secondary | ICD-10-CM

## 2015-09-24 DIAGNOSIS — K449 Diaphragmatic hernia without obstruction or gangrene: Secondary | ICD-10-CM

## 2015-09-24 HISTORY — DX: Dorsalgia, unspecified: M54.9

## 2015-09-24 HISTORY — DX: Other chronic pain: G89.29

## 2015-09-24 MED ORDER — ESOMEPRAZOLE MAGNESIUM 40 MG PO CPDR: 40.0000 mg | DELAYED_RELEASE_CAPSULE | Freq: Every day | ORAL | Status: DC

## 2015-09-24 MED ORDER — ALPRAZOLAM 0.25 MG PO TABS: 0.2500 mg | ORAL_TABLET | Freq: Three times a day (TID) | ORAL | Status: DC | PRN

## 2015-09-24 MED ORDER — METOPROLOL SUCCINATE ER 25 MG PO TB24: 12.5000 mg | ORAL_TABLET | Freq: Every morning | ORAL | Status: DC

## 2015-09-24 MED ORDER — HYDROCODONE-ACETAMINOPHEN 5-325 MG PO TABS: 0.5000 | ORAL_TABLET | Freq: Every evening | ORAL | Status: DC | PRN

## 2015-09-24 MED ORDER — ROSUVASTATIN CALCIUM 20 MG PO TABS: ORAL_TABLET | ORAL | Status: DC

## 2015-09-24 MED ORDER — CYCLOBENZAPRINE HCL 5 MG PO TABS: 5.0000 mg | ORAL_TABLET | Freq: Every day | ORAL | Status: DC

## 2015-09-24 MED ORDER — NIFEDIPINE ER 30 MG PO TB24: 30.0000 mg | ORAL_TABLET | Freq: Every day | ORAL | Status: DC

## 2015-09-24 NOTE — Progress Notes (Signed)
@[facility] @  This chart was scribed for Robyn Haber, MD by [name], ED Scribe. This patient was seen in room 12 and the patient's care was started at 2:25 PM.  Patient ID: Emily [name] MRN: [id number], DOB: 1956-08-09, 59 y.o. Date of Encounter: 09/24/2015, 2:25 PM  Primary Physician: Roselee Culver, MD  Chief Complaint:  Chief Complaint  Patient presents with   Back Pain    x 1 year   Medication Refill    HPI: 59 y.o. year old female with history below presents with back pain. Pt works Designer, [employer]ellery and had an accident at work, injuring her back, about 2 years ago and was put on partial disability.  She is still having back pain working 5 days a week, constantly on her feet as well as with walking. She has tried icy hot. States tramadol makes her dizzy that she is unable to take non steroidal's.  Pt has been seen by orthopedist Rhina Brackett for her knee in the past.   Past Medical History  Diagnosis Date   Hypertension    Hyperlipidemia    Back pain    GERD (gastroesophageal reflux disease)    Colon polyps      Home Meds: Prior to Admission medications   Medication Sig Start Date End Date Taking? Authorizing Provider  ALPRAZolam (XANAX) 0.25 MG tablet Take 1 tablet (0.25 mg total) by mouth 3 (three) times daily as needed for anxiety. 05/22/15  Yes Wendie Agreste, MD  esomeprazole (NEXIUM) 40 MG capsule Take 1 capsule (40 mg total) by mouth daily at 12 noon. 05/22/15  Yes Wendie Agreste, MD  metoprolol succinate (TOPROL-XL) 25 MG 24 hr tablet Take 0.5 tablets (12.5 mg total) by mouth every morning. 05/22/15  Yes Wendie Agreste, MD  NIFEdipine (PROCARDIA-XL/ADALAT CC) 30 MG 24 hr tablet Take 1 tablet (30 mg total) by mouth daily. 05/22/15  Yes Wendie Agreste, MD  pantoprazole (PROTONIX) 40 MG tablet Take 1 tablet (40 mg total) by mouth daily. 07/13/15  Yes Wendie Agreste, MD  rosuvastatin (CRESTOR) 20 MG tablet TAKE 1 TABLET BY MOUTH EVERY DAY. 05/22/15   Yes Wendie Agreste, MD  traMADol (ULTRAM) 50 MG tablet Take 1 tablet (50 mg total) by mouth every 6 (six) hours as needed. Patient not taking: Reported on 09/24/2015 05/22/15   Wendie Agreste, MD    Allergies:  Allergies  Allergen Reactions   Aspirin Hives   Nsaids Hives    Social History   Social History   Marital Status: Married    Spouse Name: [name]   Number of Children: 3   Years of Education: [name]   Occupational History   Not on file.   Social History Main Topics   Smoking status: Never Smoker    Smokeless tobacco: Never Used   Alcohol Use: No   Drug Use: No   Sexual Activity: Not on file   Other Topics Concern   Not on file   Social History Narrative     Review of Systems: Constitutional: negative for chills, fever, night sweats, weight changes, or fatigue  HEENT: negative for vision changes, hearing loss, congestion, rhinorrhea, ST, epistaxis, or sinus pressure Cardiovascular: negative for chest pain or palpitations Respiratory: negative for hemoptysis, wheezing, shortness of breath, or cough Abdominal: negative for abdominal pain, nausea, vomiting, diarrhea, or constipation Dermatological: negative for rash Neurologic: negative for headache, dizziness, or syncope All other systems reviewed and are otherwise negative with the exception to those  above and in the HPI.   Physical Exam: Blood pressure 114/60, pulse 85, temperature 98.7 F (37.1 C), temperature source Oral, resp. rate 16, height 5\' 4"  (1.626 m), weight 188 lb (85.276 kg), SpO2 95 %., Body mass index is 32.25 kg/(m^2). General: Well developed, well nourished, in no acute distress. Head: Normocephalic, atraumatic, eyes without discharge, sclera non-icteric, nares are without discharge. Bilateral auditory canals clear, TM's are without perforation, pearly grey and translucent with reflective cone of light bilaterally. Oral cavity moist, posterior pharynx without exudate, erythema,  peritonsillar abscess, or post nasal drip.  Neck: Supple. No thyromegaly. Full ROM. No lymphadenopathy. Lungs: Clear bilaterally to auscultation without wheezes, rales, or rhonchi. Breathing is unlabored. Heart: RRR with S1 S2. No murmurs, rubs, or gallops appreciated. Abdomen: Soft, non-tender, non-distended with normoactive bowel sounds. No hepatomegaly. No rebound/guarding. No obvious abdominal masses. Msk:  Strength and tone normal for age. Extremities/Skin: Warm and dry. No clubbing or cyanosis. No edema. No rashes or suspicious lesions. Neuro: Alert and oriented X 3. Moves all extremities spontaneously. Gait is normal. CNII-XII grossly in tact. Psych:  Responds to questions appropriately with a normal affect.    ASSESSMENT AND PLAN:  59 y.o. year old female with chronic back pain following disabling accident at work. This chart was scribed in my presence and reviewed by me personally.    ICD-9-CM ICD-10-CM   1. Midline thoracic back pain 724.1 M54.6 cyclobenzaprine (FLEXERIL) 5 MG tablet     HYDROcodone-acetaminophen (NORCO) 5-325 MG tablet     Ambulatory referral to Orthopedic Surgery  2. Anxiety 300.00 F41.9 ALPRAZolam (XANAX) 0.25 MG tablet  3. Hiatal hernia 553.3 K44.9 esomeprazole (NEXIUM) 40 MG capsule  4. Gastroesophageal reflux disease, esophagitis presence not specified 530.81 K21.9 esomeprazole (NEXIUM) 40 MG capsule  5. Essential hypertension 401.9 I10 metoprolol succinate (TOPROL-XL) 25 MG 24 hr tablet     NIFEdipine (PROCARDIA-XL/ADALAT CC) 30 MG 24 hr tablet  6. Hyperlipidemia 272.4 E78.5 rosuvastatin (CRESTOR) 20 MG tablet    By signing my name below, I, [name], attest that this documentation has been prepared under the direction and in the presence of Robyn Haber, MD.  Electronically Signed: [name], ED Scribe. 09/24/2015. 3:01 PM.  Signed, Robyn Haber, MD 09/24/2015 2:25 PM

## 2015-10-01 ENCOUNTER — Telehealth: Payer: Self-pay

## 2015-10-01 NOTE — Telephone Encounter (Signed)
DR L - Pt was calling about the status of the referral.   [phone]

## 2015-10-17 ENCOUNTER — Ambulatory Visit (INDEPENDENT_AMBULATORY_CARE_PROVIDER_SITE_OTHER): Payer: 59 | Admitting: Family Medicine

## 2015-10-17 VITALS — BP 102/58 | HR 86 | Temp 98.1°F | Resp 16 | Ht 64.0 in | Wt 190.0 lb

## 2015-10-17 DIAGNOSIS — E785 Hyperlipidemia, unspecified: Secondary | ICD-10-CM | POA: Diagnosis not present

## 2015-10-17 DIAGNOSIS — K449 Diaphragmatic hernia without obstruction or gangrene: Secondary | ICD-10-CM

## 2015-10-17 DIAGNOSIS — I10 Essential (primary) hypertension: Secondary | ICD-10-CM

## 2015-10-17 DIAGNOSIS — R350 Frequency of micturition: Secondary | ICD-10-CM | POA: Diagnosis not present

## 2015-10-17 DIAGNOSIS — R000 Tachycardia, unspecified: Secondary | ICD-10-CM

## 2015-10-17 DIAGNOSIS — R05 Cough: Secondary | ICD-10-CM

## 2015-10-17 DIAGNOSIS — R1084 Generalized abdominal pain: Secondary | ICD-10-CM

## 2015-10-17 DIAGNOSIS — M546 Pain in thoracic spine: Secondary | ICD-10-CM | POA: Diagnosis not present

## 2015-10-17 DIAGNOSIS — E039 Hypothyroidism, unspecified: Secondary | ICD-10-CM

## 2015-10-17 DIAGNOSIS — G44229 Chronic tension-type headache, not intractable: Secondary | ICD-10-CM | POA: Diagnosis not present

## 2015-10-17 DIAGNOSIS — R059 Cough, unspecified: Secondary | ICD-10-CM

## 2015-10-17 DIAGNOSIS — F419 Anxiety disorder, unspecified: Secondary | ICD-10-CM

## 2015-10-17 DIAGNOSIS — K219 Gastro-esophageal reflux disease without esophagitis: Secondary | ICD-10-CM | POA: Diagnosis not present

## 2015-10-17 LAB — POCT URINALYSIS DIP (MANUAL ENTRY)
Bilirubin, UA: NEGATIVE
Blood, UA: NEGATIVE
Glucose, UA: NEGATIVE
Ketones, POC UA: NEGATIVE
Nitrite, UA: NEGATIVE
Protein Ur, POC: NEGATIVE
Spec Grav, UA: <=1.005
Urobilinogen, UA: 0.2
pH, UA: 6

## 2015-10-17 LAB — POC MICROSCOPIC URINALYSIS (UMFC): Mucus: ABSENT

## 2015-10-17 LAB — POCT CBC
Granulocyte percent: 60.1 %G (ref 37–80)
HCT, POC: 38.1 % (ref 37.7–47.9)
Hemoglobin: 12.6 g/dL (ref 12.2–16.2)
Lymph, poc: 2.6 (ref 0.6–3.4)
MCH, POC: 25.2 pg — AB (ref 27–31.2)
MCHC: 33.1 g/dL (ref 31.8–35.4)
MCV: 76 fL — AB (ref 80–97)
MID (cbc): 0.8 (ref 0–0.9)
MPV: 7.2 fL (ref 0–99.8)
POC Granulocyte: 5.1 (ref 2–6.9)
POC LYMPH PERCENT: 30.9 %L (ref 10–50)
POC MID %: 9 %M (ref 0–12)
Platelet Count, POC: 255 10*3/uL (ref 142–424)
RBC: 5.02 M/uL (ref 4.04–5.48)
RDW, POC: 14.3 %
WBC: 8.5 10*3/uL (ref 4.6–10.2)

## 2015-10-17 MED ORDER — TOPIRAMATE 50 MG PO TABS: 50.0000 mg | ORAL_TABLET | Freq: Every day | ORAL | Status: DC

## 2015-10-17 MED ORDER — NIFEDIPINE ER 30 MG PO TB24: 30.0000 mg | ORAL_TABLET | Freq: Every day | ORAL | Status: DC

## 2015-10-17 MED ORDER — ALPRAZOLAM 0.25 MG PO TABS: 0.2500 mg | ORAL_TABLET | Freq: Three times a day (TID) | ORAL | Status: DC | PRN

## 2015-10-17 MED ORDER — METOPROLOL SUCCINATE ER 25 MG PO TB24: 12.5000 mg | ORAL_TABLET | Freq: Every morning | ORAL | Status: DC

## 2015-10-17 MED ORDER — PREDNISONE 20 MG PO TABS
ORAL_TABLET | ORAL | Status: DC
Start: 2015-10-17 — End: 2016-04-11

## 2015-10-17 MED ORDER — ESOMEPRAZOLE MAGNESIUM 40 MG PO CPDR: 40.0000 mg | DELAYED_RELEASE_CAPSULE | Freq: Every day | ORAL | Status: DC

## 2015-10-17 MED ORDER — CYCLOBENZAPRINE HCL 5 MG PO TABS: 5.0000 mg | ORAL_TABLET | Freq: Every day | ORAL | Status: DC

## 2015-10-17 MED ORDER — ROSUVASTATIN CALCIUM 20 MG PO TABS: ORAL_TABLET | ORAL | Status: DC

## 2015-10-17 MED ORDER — HYDROCODONE-ACETAMINOPHEN 5-325 MG PO TABS: 0.5000 | ORAL_TABLET | Freq: Every evening | ORAL | Status: DC | PRN

## 2015-10-17 NOTE — Progress Notes (Addendum)
Subjective:    Patient ID: Emily [name], female    DOB: [birth year], 59 y.o.   MRN: [id number] This chart was scribed for Robyn Haber, MD by Zola Button, Medical Scribe. This patient was seen in Room 2 and the patient's care was started at 6:44 PM.   HPI HPI Comments: Emily [name] is a 59 y.o. female who presents to the [facility] complaining of bilateral posterior rib pain that started 4-5 days ago. Patient reports having associated abdominal pain, tachycardia, chills, headache, and urinary frequency. She denies fever, dysuria, and cough.    Review of Systems  Constitutional: Positive for chills. Negative for fever.  Cardiovascular: Positive for palpitations.  Gastrointestinal: Positive for abdominal pain.  Genitourinary: Positive for frequency. Negative for dysuria.  Neurological: Positive for headaches.       Objective:   Physical Exam CONSTITUTIONAL: Well developed/well nourished HEAD: Normocephalic/atraumatic EYES: EOM/PERRL ENMT: Mucous membranes moist NECK: supple no meningeal signs SPINE: entire spine nontender CV: S1/S2 noted, no murmurs/rubs/gallops noted LUNGS: Lungs are clear to auscultation bilaterally, no apparent distress ABDOMEN: soft, diffuse mild, no rebound or guarding; no guarding, no masses, no HSM GU: Bilateral mild cva tenderness NEURO: Pt is awake/alert, moves all extremitiesx4 EXTREMITIES: pulses normal, full ROM SKIN: warm, color normal PSYCH: no abnormalities of mood noted  EKG: Normal sinus rhythm  Results for orders placed or performed in visit on 10/17/15  POCT CBC  Result Value Ref Range   WBC 8.5 4.6 - 10.2 K/uL   Lymph, poc 2.6 0.6 - 3.4   POC LYMPH PERCENT 30.9 10 - 50 %L   MID (cbc) 0.8 0 - 0.9   POC MID % 9.0 0 - 12 %M   POC Granulocyte 5.1 2 - 6.9   Granulocyte percent 60.1 37 - 80 %G   RBC 5.02 4.04 - 5.48 M/uL   Hemoglobin 12.6 12.2 - 16.2 g/dL   HCT, POC 38.1 37.7 - 47.9 %   MCV 76.0  (A) 80 - 97 fL   MCH, POC 25.2 (A) 27 - 31.2 pg   MCHC 33.1 31.8 - 35.4 g/dL   RDW, POC 14.3 %   Platelet Count, POC 255 142 - 424 K/uL   MPV 7.2 0 - 99.8 fL  POCT Microscopic Urinalysis ([facility])  Result Value Ref Range   WBC,UR,HPF,POC Few (A) None WBC/hpf   RBC,UR,HPF,POC None None RBC/hpf   Bacteria Few (A) None, Too numerous to count   Mucus Absent Absent   Epithelial Cells, UR Per Microscopy Few (A) None, Too numerous to count cells/hpf  POCT urinalysis dipstick  Result Value Ref Range   Color, UA yellow yellow   Clarity, UA clear clear   Glucose, UA negative negative   Bilirubin, UA negative negative   Ketones, POC UA negative negative   Spec Grav, UA <=1.005    Blood, UA negative negative   pH, UA 6.0    Protein Ur, POC negative negative   Urobilinogen, UA 0.2    Nitrite, UA Negative Negative   Leukocytes, UA Trace (A) Negative   Results for orders placed or performed in visit on 10/17/15  POCT CBC  Result Value Ref Range   WBC 8.5 4.6 - 10.2 K/uL   Lymph, poc 2.6 0.6 - 3.4   POC LYMPH PERCENT 30.9 10 - 50 %L   MID (cbc) 0.8 0 - 0.9   POC MID % 9.0 0 - 12 %M   POC Granulocyte 5.1 2 -  6.9   Granulocyte percent 60.1 37 - 80 %G   RBC 5.02 4.04 - 5.48 M/uL   Hemoglobin 12.6 12.2 - 16.2 g/dL   HCT, POC 38.1 37.7 - 47.9 %   MCV 76.0 (A) 80 - 97 fL   MCH, POC 25.2 (A) 27 - 31.2 pg   MCHC 33.1 31.8 - 35.4 g/dL   RDW, POC 14.3 %   Platelet Count, POC 255 142 - 424 K/uL   MPV 7.2 0 - 99.8 fL  POCT Microscopic Urinalysis ([facility])  Result Value Ref Range   WBC,UR,HPF,POC Few (A) None WBC/hpf   RBC,UR,HPF,POC None None RBC/hpf   Bacteria Few (A) None, Too numerous to count   Mucus Absent Absent   Epithelial Cells, UR Per Microscopy Few (A) None, Too numerous to count cells/hpf  POCT urinalysis dipstick  Result Value Ref Range   Color, UA yellow yellow   Clarity, UA clear clear   Glucose, UA negative negative   Bilirubin, UA negative negative   Ketones, POC UA  negative negative   Spec Grav, UA <=1.005    Blood, UA negative negative   pH, UA 6.0    Protein Ur, POC negative negative   Urobilinogen, UA 0.2    Nitrite, UA Negative Negative   Leukocytes, UA Trace (A) Negative       Assessment & Plan:   By signing my name below, I, Zola Button, attest that this documentation has been prepared under the direction and in the presence of Robyn Haber, MD.  Electronically Signed: Zola Button, Medical Scribe. 10/17/2015. 6:43 PM. This chart was scribed in my presence and reviewed by me personally.    ICD-9-CM ICD-10-CM   1. Cough 786.2 R05 predniSONE (DELTASONE) 20 MG tablet  2. Hypothyroidism, unspecified hypothyroidism type 244.9 E03.9 POCT CBC     COMPLETE METABOLIC PANEL WITH GFR     CANCELED: TSH  3. Chronic tension-type headache, not intractable 339.12 G44.229 POCT CBC     DISCONTINUED: topiramate (TOPAMAX) 50 MG tablet     CANCELED: EKG 12-Lead  4. Urinary frequency 788.41 R35.0 POCT Microscopic Urinalysis ([facility])     POCT urinalysis dipstick  5. Generalized abdominal pain 789.07 R10.84 POCT CBC     COMPLETE METABOLIC PANEL WITH GFR     CANCELED: EKG 12-Lead  6. Tachycardia 785.0 R00.0 EKG 12-Lead  7. Anxiety 300.00 F41.9 ALPRAZolam (XANAX) 0.25 MG tablet  8. Midline thoracic back pain 724.1 M54.6 cyclobenzaprine (FLEXERIL) 5 MG tablet     HYDROcodone-acetaminophen (NORCO) 5-325 MG tablet  9. Hiatal hernia 553.3 K44.9 esomeprazole (NEXIUM) 40 MG capsule  10. Gastroesophageal reflux disease, esophagitis presence not specified 530.81 K21.9 esomeprazole (NEXIUM) 40 MG capsule  11. Essential hypertension 401.9 I10 metoprolol succinate (TOPROL-XL) 25 MG 24 hr tablet     NIFEdipine (PROCARDIA-XL/ADALAT CC) 30 MG 24 hr tablet  12. Hyperlipidemia 272.4 E78.5 rosuvastatin (CRESTOR) 20 MG tablet     Signed, Robyn Haber, MD

## 2015-10-17 NOTE — Progress Notes (Signed)
z

## 2015-10-18 LAB — COMPLETE METABOLIC PANEL WITH GFR
ALT: 21 U/L (ref 6–29)
AST: 20 U/L (ref 10–35)
Albumin: 3.9 g/dL (ref 3.6–5.1)
Alkaline Phosphatase: 117 U/L (ref 33–130)
BUN: 16 mg/dL (ref 7–25)
CO2: 28 mmol/L (ref 20–31)
Calcium: 10 mg/dL (ref 8.6–10.4)
Chloride: 105 mmol/L (ref 98–110)
Creat: 0.66 mg/dL (ref 0.50–1.05)
GFR, Est African American: >89 mL/min (ref >=60)
GFR, Est Non African American: >89 mL/min (ref >=60)
Glucose, Bld: 89 mg/dL (ref 65–99)
Potassium: 3.8 mmol/L (ref 3.5–5.3)
Sodium: 140 mmol/L (ref 135–146)
Total Bilirubin: 0.3 mg/dL (ref 0.2–1.2)
Total Protein: 7.5 g/dL (ref 6.1–8.1)

## 2015-10-28 ENCOUNTER — Encounter: Payer: Self-pay | Admitting: Gastroenterology

## 2015-10-28 ENCOUNTER — Ambulatory Visit (INDEPENDENT_AMBULATORY_CARE_PROVIDER_SITE_OTHER): Payer: 59 | Admitting: Gastroenterology

## 2015-10-28 VITALS — BP 106/68 | HR 76 | Ht 64.0 in | Wt 185.4 lb

## 2015-10-28 DIAGNOSIS — R1011 Right upper quadrant pain: Secondary | ICD-10-CM

## 2015-10-28 DIAGNOSIS — R1013 Epigastric pain: Secondary | ICD-10-CM | POA: Diagnosis not present

## 2015-10-28 DIAGNOSIS — G8929 Other chronic pain: Secondary | ICD-10-CM | POA: Diagnosis not present

## 2015-10-28 DIAGNOSIS — R140 Abdominal distension (gaseous): Secondary | ICD-10-CM | POA: Diagnosis not present

## 2015-10-28 NOTE — Patient Instructions (Addendum)
You have been scheduled for an abdominal ultrasound at [facility] (1st floor of hospital) on 11-03-2015 at [place]. Please arrive 15 minutes prior to your appointment for registration. Make certain not to have anything to eat or drink 6 hours prior to your appointment. Should you need to reschedule your appointment, please contact radiology at [phone]. This test typically takes about 30 minutes to perform.  Please Start IBgard or FDgard one capsule twice a day.

## 2015-11-02 ENCOUNTER — Encounter: Payer: Self-pay | Admitting: Gastroenterology

## 2015-11-02 DIAGNOSIS — G8929 Other chronic pain: Secondary | ICD-10-CM | POA: Insufficient documentation

## 2015-11-02 DIAGNOSIS — R1013 Epigastric pain: Secondary | ICD-10-CM

## 2015-11-02 DIAGNOSIS — R10811 Right upper quadrant abdominal tenderness: Secondary | ICD-10-CM | POA: Insufficient documentation

## 2015-11-02 DIAGNOSIS — R140 Abdominal distension (gaseous): Secondary | ICD-10-CM | POA: Insufficient documentation

## 2015-11-02 HISTORY — DX: Epigastric pain: R10.13

## 2015-11-02 HISTORY — DX: Other chronic pain: G89.29

## 2015-11-02 NOTE — Progress Notes (Signed)
     10/28/2015 Emily [name] [facility] [id number] 05/03/1956   History of Present Illness:  This is a primarily [place] speaking female who is known to Dr. Ardis Hughs.  She had an EGD in 02/2015 that showed a 3 cm hiatal hernia and some non-specific gastritis that was Hpylori negative on biopsy.  Presents to our office today with complaints of upper abdominal pain/discomfort and bloating.  Says that pain is all in her upper abdomen, mostly in the center and to the right that radiates into her back and around the right side.  Has pain daily and says that it is worse than it was previously.  It is mostly post-prandial.  No nausea or vomiting.  I believe that these were the same complaints that she had earlier this year when she underwent the EGD.  She is on Nexium 40 mg daily and carafate TID, which seems to help some.  All communication was performed via an interpreter.  CBC and CMP recently normal.  Current Medications, Allergies, Past Medical History, Past Surgical History, Family History and Social History were reviewed in [employer] record.   Physical Exam: BP 106/68 mmHg  Pulse 76  Ht 5\' 4"  (1.626 m)  Wt 185 lb 6 oz (84.086 kg)  BMI 31.80 kg/m2 General: Well developed female in no acute distress Head: Normocephalic and atraumatic. Eyes:  Sclerae anicteric, conjunctiva pink  Ears: Normal auditory acuity Lungs: Clear throughout to auscultation Heart: Regular rate and rhythm Abdomen: Soft, non-distended.  Normal bowel sounds.  Mild upper abdominal TTP. Musculoskeletal: Symmetrical with no gross deformities  Extremities: No edema  Neurological: Alert oriented x 4, grossly non-focal Psychological:  Alert and cooperative. Normal mood and affect  Assessment and Recommendations: -Upper abdominal pain with bloating:  Seems mostly epigastric and RUQ that radiates to her back.  Unsure of the cause of her symptoms.  ? Gallbladder vs gastroparesis vs pancreatic source.  Will  start with abdominal ultrasound, but could also consider HIDA, GES, CT scan if needed.  Continue Nexium 40 mg daily and carafate tablet TID for now.  Will have her try OTC FDgard or IBgard for bloating/dyspepsia in the interim while we await studies.

## 2015-11-03 ENCOUNTER — Ambulatory Visit (HOSPITAL_COMMUNITY): Payer: 59

## 2015-11-03 NOTE — Progress Notes (Signed)
i agree with the above note, plan 

## 2015-11-04 ENCOUNTER — Ambulatory Visit (HOSPITAL_COMMUNITY)
Admission: RE | Admit: 2015-11-04 | Discharge: 2015-11-04 | Disposition: A | Discharge status: Home / Self Care | Payer: 59 | Source: Ambulatory Visit | Attending: Gastroenterology | Admitting: Gastroenterology

## 2015-11-04 DIAGNOSIS — R1013 Epigastric pain: Secondary | ICD-10-CM

## 2015-11-04 DIAGNOSIS — R109 Unspecified abdominal pain: Secondary | ICD-10-CM | POA: Diagnosis present

## 2015-11-04 DIAGNOSIS — R1011 Right upper quadrant pain: Secondary | ICD-10-CM

## 2015-11-04 DIAGNOSIS — G8929 Other chronic pain: Secondary | ICD-10-CM

## 2015-11-04 DIAGNOSIS — R140 Abdominal distension (gaseous): Secondary | ICD-10-CM | POA: Diagnosis not present

## 2015-11-15 ENCOUNTER — Telehealth: Payer: Self-pay | Admitting: Gastroenterology

## 2015-11-15 DIAGNOSIS — R1013 Epigastric pain: Secondary | ICD-10-CM

## 2015-11-15 NOTE — Telephone Encounter (Signed)
Patient is asking for [place] results. Please, advise.

## 2015-11-16 NOTE — Telephone Encounter (Signed)
Ultrasound is normal/negative.  Please schedule patient for HIDA with [facility] to rule out gallbladder dysfunction as cause of her pain if she is agreeable.  Patient speaks Spanish so will likely need interpreter.  Thank you,  Jess

## 2015-11-21 NOTE — Telephone Encounter (Signed)
Patient given results and recommendation. Patient given appointment date, time and instructions via Abigail Butts.

## 2015-11-21 NOTE — Telephone Encounter (Signed)
Scheduled HIDA with [facility] on 12/02/15 at 8:00 AM. NPO after midnight. 2 hour test. Left a message for patient to call back.

## 2015-11-27 ENCOUNTER — Ambulatory Visit (INDEPENDENT_AMBULATORY_CARE_PROVIDER_SITE_OTHER): Payer: 59 | Admitting: Emergency Medicine

## 2015-11-27 VITALS — BP 106/62 | HR 75 | Temp 98.4°F | Resp 18 | Ht 65.0 in | Wt 186.0 lb

## 2015-11-27 DIAGNOSIS — H1131 Conjunctival hemorrhage, right eye: Secondary | ICD-10-CM

## 2015-11-27 NOTE — Progress Notes (Signed)
   11/28/2015 9:23 AM   DOB: December 12, 1956 / MRN: [id number]  SUBJECTIVE:  Emily [name] is a 59 y.o. female presenting for right sided red eye that started this morning.  She denies eye pain, eye trauma, change in vision, photophobia, HA, nausea, weakness and change in sensation.  Denies foreign body sensation.  Denies new medication.  She thought her BP may have something to do with this onset of the problem, as it was 140/94 when she measured it this morning.  Denies a history of DM2.  Has a history of HTN and this is managed with metoprolol and nifedipine.    She is allergic to aspirin and nsaids.   She  has a past medical history of Hypertension; Hyperlipidemia; Back pain; GERD (gastroesophageal reflux disease); Colon polyps; and Arthritis.    She  reports that she has never smoked. She has never used smokeless tobacco. She reports that she does not drink alcohol or use illicit drugs. She  has no sexual activity history on file. The patient  has past surgical history that includes Abdominal hysterectomy.  Her family history includes Aneurysm in her mother; Colon cancer (age of onset: 50) in her brother; Diabetes in her father.  Review of Systems  Constitutional: Negative for fever.  Eyes: Negative for blurred vision, double vision, photophobia, pain, discharge and redness.  Gastrointestinal: Negative for nausea.  Musculoskeletal: Negative for neck pain.  Neurological: Negative for dizziness, tingling, tremors and headaches.    Problem list and medications reviewed and updated by myself where necessary, and exist elsewhere in the encounter.   OBJECTIVE:  BP 106/62 mmHg  Pulse 75  Temp(Src) 98.4 F (36.9 C) (Oral)  Resp 18  Ht 5\' 5"  (1.651 m)  Wt 186 lb (84.369 kg)  BMI 30.95 kg/m2  SpO2 97% CrCl cannot be calculated (Patient has no serum creatinine result on file.).  Physical Exam  Constitutional: She is oriented to person, place, and time. She appears well-nourished. No  distress.  Eyes: EOM are normal. Pupils are equal, round, and reactive to light.    Cardiovascular: Normal rate.   Pulmonary/Chest: Effort normal.  Abdominal: She exhibits no distension.  Neurological: She is alert and oriented to person, place, and time. No cranial nerve deficit. Gait normal.  Skin: Skin is dry. She is not diaphoretic.  Psychiatric: She has a normal mood and affect.  Vitals reviewed.   No results found for this or any previous visit (from the past 48 hour(s)).  ASSESSMENT AND PLAN  Emily [name] was seen today for hypertension and other.  Diagnoses and all orders for this visit:  Subconjunctival hemorrhage, right: Appear primary and spontaneous.  She is asymptomatic otherwise.  Vitals reassuring.  Given her age and history of HTN will refer routinely to Dr. Katy Fitch for further eval. -     Ambulatory referral to Ophthalmology   The patient was advised to call or return to clinic if she does not see an improvement in symptoms or to seek the care of the closest emergency department if she worsens with the above plan.   Emily [name], MHS, PA-C [facility] 11/28/2015 9:23 AM

## 2015-11-28 NOTE — Progress Notes (Signed)
  Medical screening examination/treatment/procedure(s) were performed by non-physician practitioner and as supervising physician I was immediately available for consultation/collaboration.     

## 2015-12-02 ENCOUNTER — Encounter (HOSPITAL_COMMUNITY): Payer: 59

## 2015-12-27 ENCOUNTER — Other Ambulatory Visit: Payer: Self-pay | Admitting: Family Medicine

## 2016-01-03 ENCOUNTER — Other Ambulatory Visit: Payer: Self-pay | Admitting: Family Medicine

## 2016-03-24 ENCOUNTER — Other Ambulatory Visit: Payer: Self-pay | Admitting: Family Medicine

## 2016-04-11 ENCOUNTER — Observation Stay (HOSPITAL_COMMUNITY)
Admission: EM | Admit: 2016-04-11 | Discharge: 2016-04-13 | Disposition: A | Discharge status: Home / Self Care | Payer: BLUE CROSS/BLUE SHIELD | Attending: Internal Medicine | Admitting: Internal Medicine

## 2016-04-11 ENCOUNTER — Encounter (HOSPITAL_COMMUNITY): Payer: Self-pay | Admitting: Emergency Medicine

## 2016-04-11 DIAGNOSIS — R195 Other fecal abnormalities: Secondary | ICD-10-CM | POA: Diagnosis not present

## 2016-04-11 DIAGNOSIS — Z79899 Other long term (current) drug therapy: Secondary | ICD-10-CM | POA: Diagnosis not present

## 2016-04-11 DIAGNOSIS — Z86010 Personal history of colonic polyps: Secondary | ICD-10-CM | POA: Diagnosis not present

## 2016-04-11 DIAGNOSIS — D649 Anemia, unspecified: Secondary | ICD-10-CM | POA: Diagnosis not present

## 2016-04-11 DIAGNOSIS — M549 Dorsalgia, unspecified: Secondary | ICD-10-CM | POA: Insufficient documentation

## 2016-04-11 DIAGNOSIS — K449 Diaphragmatic hernia without obstruction or gangrene: Secondary | ICD-10-CM | POA: Diagnosis not present

## 2016-04-11 DIAGNOSIS — M1990 Unspecified osteoarthritis, unspecified site: Secondary | ICD-10-CM | POA: Diagnosis not present

## 2016-04-11 DIAGNOSIS — F419 Anxiety disorder, unspecified: Secondary | ICD-10-CM | POA: Diagnosis not present

## 2016-04-11 DIAGNOSIS — G8929 Other chronic pain: Secondary | ICD-10-CM | POA: Insufficient documentation

## 2016-04-11 DIAGNOSIS — I10 Essential (primary) hypertension: Secondary | ICD-10-CM | POA: Insufficient documentation

## 2016-04-11 DIAGNOSIS — R1013 Epigastric pain: Secondary | ICD-10-CM | POA: Diagnosis not present

## 2016-04-11 DIAGNOSIS — K219 Gastro-esophageal reflux disease without esophagitis: Secondary | ICD-10-CM | POA: Diagnosis not present

## 2016-04-11 DIAGNOSIS — G4700 Insomnia, unspecified: Secondary | ICD-10-CM | POA: Diagnosis not present

## 2016-04-11 DIAGNOSIS — E785 Hyperlipidemia, unspecified: Secondary | ICD-10-CM | POA: Insufficient documentation

## 2016-04-11 DIAGNOSIS — K921 Melena: Secondary | ICD-10-CM | POA: Diagnosis present

## 2016-04-11 DIAGNOSIS — K254 Chronic or unspecified gastric ulcer with hemorrhage: Secondary | ICD-10-CM

## 2016-04-11 DIAGNOSIS — R109 Unspecified abdominal pain: Secondary | ICD-10-CM | POA: Diagnosis present

## 2016-04-11 HISTORY — DX: Other chronic pain: G89.29

## 2016-04-11 HISTORY — DX: Epigastric pain: R10.13

## 2016-04-11 HISTORY — DX: Dorsalgia, unspecified: M54.9

## 2016-04-11 LAB — CBC
HCT: 36.4 % (ref 36.0–46.0)
HEMOGLOBIN: 12.7 g/dL (ref 12.0–15.0)
MCH: 25.5 pg — ABNORMAL LOW (ref 26.0–34.0)
MCHC: 34.9 g/dL (ref 30.0–36.0)
MCV: 73.1 fL — ABNORMAL LOW (ref 78.0–100.0)
Platelets: 262 10*3/uL (ref 150–400)
RBC: 4.98 MIL/uL (ref 3.87–5.11)
RDW: 14.7 % (ref 11.5–15.5)
WBC: 8.1 10*3/uL (ref 4.0–10.5)

## 2016-04-11 LAB — COMPREHENSIVE METABOLIC PANEL
ALBUMIN: 4 g/dL (ref 3.5–5.0)
ALK PHOS: 116 U/L (ref 38–126)
ALT: 24 U/L (ref 14–54)
ANION GAP: 9 (ref 5–15)
AST: 21 U/L (ref 15–41)
BILIRUBIN TOTAL: 0.4 mg/dL (ref 0.3–1.2)
BUN: 16 mg/dL (ref 6–20)
CO2: 24 mmol/L (ref 22–32)
Calcium: 9.4 mg/dL (ref 8.9–10.3)
Chloride: 106 mmol/L (ref 101–111)
Creatinine, Ser: 0.53 mg/dL (ref 0.44–1.00)
GFR calc Af Amer: >60 mL/min (ref >60)
GLUCOSE: 94 mg/dL (ref 65–99)
POTASSIUM: 3.4 mmol/L — AB (ref 3.5–5.1)
Sodium: 139 mmol/L (ref 135–145)
TOTAL PROTEIN: 7.6 g/dL (ref 6.5–8.1)

## 2016-04-11 LAB — TYPE AND SCREEN
ABO/RH(D): O POS
ANTIBODY SCREEN: NEGATIVE

## 2016-04-11 LAB — LIPASE, BLOOD: Lipase: 26 U/L (ref 11–51)

## 2016-04-11 MED ORDER — ROSUVASTATIN CALCIUM 20 MG PO TABS
20.0000 mg | ORAL_TABLET | Freq: Every day | ORAL | Status: DC
  Administered 2016-04-12: 20 mg via ORAL
  Filled 2016-04-11: qty 1

## 2016-04-11 MED ORDER — METOPROLOL SUCCINATE ER 25 MG PO TB24
25.0000 mg | ORAL_TABLET | Freq: Every morning | ORAL | Status: DC
  Administered 2016-04-12 – 2016-04-13 (×2): 25 mg via ORAL
  Filled 2016-04-11 (×2): qty 1

## 2016-04-11 MED ORDER — SODIUM CHLORIDE 0.9 % IV SOLN
INTRAVENOUS | Status: AC
  Administered 2016-04-12: via INTRAVENOUS

## 2016-04-11 MED ORDER — SODIUM CHLORIDE 0.9 % IV SOLN
80.0000 mg | Freq: Two times a day (BID) | INTRAVENOUS | Status: DC
  Administered 2016-04-12: 80 mg via INTRAVENOUS
  Filled 2016-04-11 (×2): qty 80

## 2016-04-11 MED ORDER — ALPRAZOLAM 0.25 MG PO TABS: 0.2500 mg | ORAL_TABLET | Freq: Three times a day (TID) | ORAL | Status: DC | PRN

## 2016-04-11 MED ORDER — MORPHINE SULFATE (PF) 2 MG/ML IV SOLN
2.0000 mg | INTRAVENOUS | Status: DC | PRN
Start: 2016-04-11 — End: 2016-04-13
  Administered 2016-04-12: 2 mg via INTRAVENOUS
  Filled 2016-04-11: qty 1

## 2016-04-11 MED ORDER — SODIUM CHLORIDE 0.9 % IV SOLN
INTRAVENOUS | Status: DC
Start: 2016-04-11 — End: 2016-04-13
  Administered 2016-04-12: 18:00:00 via INTRAVENOUS
  Administered 2016-04-12: 125 mL/h via INTRAVENOUS
  Administered 2016-04-12 – 2016-04-13 (×2): via INTRAVENOUS

## 2016-04-11 MED ORDER — PANTOPRAZOLE SODIUM 40 MG IV SOLR
40.0000 mg | Freq: Once | INTRAVENOUS | Status: AC
  Administered 2016-04-11: 40 mg via INTRAVENOUS
  Filled 2016-04-11: qty 40

## 2016-04-11 MED ORDER — ONDANSETRON HCL 4 MG/2ML IJ SOLN: 4.0000 mg | Freq: Three times a day (TID) | INTRAMUSCULAR | Status: DC | PRN

## 2016-04-11 MED ORDER — NIFEDIPINE ER 30 MG PO TB24
30.0000 mg | ORAL_TABLET | Freq: Every day | ORAL | Status: DC
  Administered 2016-04-12 – 2016-04-13 (×2): 30 mg via ORAL
  Filled 2016-04-11 (×2): qty 1

## 2016-04-11 NOTE — ED Notes (Signed)
Pt ambulated to the restroom with no difficulty  

## 2016-04-11 NOTE — ED Provider Notes (Signed)
CSN: [id number]     Arrival date & time 04/11/16  37 History   First MD Initiated Contact with Patient 04/11/16 1837     Chief Complaint  Patient presents with  . Abdominal Pain  . Melena      HPI Patient presents with epigastric pain and black tarry stools for the last 48 hours.  Patient had history of gastritis in the past.  Denies syncope.  Denies chest pain.  Denies fever chills.  Denies vomiting blood. Past Medical History  Diagnosis Date  . Hypertension   . Hyperlipidemia   . Back pain   . GERD (gastroesophageal reflux disease)   . Colon polyps   . Arthritis    Past Surgical History  Procedure Laterality Date  . Abdominal hysterectomy     Family History  Problem Relation Age of Onset  . Aneurysm Mother   . Diabetes Father   . Colon cancer Brother 89   Social History  Substance Use Topics  . Smoking status: Never Smoker   . Smokeless tobacco: Never Used  . Alcohol Use: No   OB History    No data available     Review of Systems All other systems reviewed and are negative   Allergies  Aspirin and Nsaids  Home Medications   Prior to Admission medications   Medication Sig Start Date End Date Taking? Authorizing Provider  esomeprazole (NEXIUM) 40 MG capsule Take 1 capsule (40 mg total) by mouth daily at 12 noon. 10/17/15  Yes Robyn Haber, MD  metoprolol succinate (TOPROL-XL) 25 MG 24 hr tablet TAKE 1 TABLET BY MOUTH EVERY MORNING 03/25/16  Yes Chelle Jeffery, PA-C  NIFEdipine (PROCARDIA-XL/ADALAT CC) 30 MG 24 hr tablet TAKE 1 TABLET (30 MG) BY MOUTH DAILY 01/04/16  Yes Wendie Agreste, MD  rosuvastatin (CRESTOR) 20 MG tablet TAKE 1 TABLET BY MOUTH EVERY DAY. 10/17/15  Yes Robyn Haber, MD  ALPRAZolam Duanne Moron) 0.25 MG tablet Take 1 tablet (0.25 mg total) by mouth 3 (three) times daily as needed for anxiety. 10/17/15   Robyn Haber, MD  cyclobenzaprine (FLEXERIL) 5 MG tablet Take 1 tablet (5 mg total) by mouth at bedtime. Patient not taking: Reported  on 04/11/2016 10/17/15   Robyn Haber, MD  metoprolol succinate (TOPROL-XL) 25 MG 24 hr tablet Take 0.5 tablets (12.5 mg total) by mouth every morning. Patient not taking: Reported on 04/11/2016 10/17/15   Robyn Haber, MD  predniSONE (DELTASONE) 20 MG tablet Two daily with food Patient not taking: Reported on 11/27/2015 10/17/15   Robyn Haber, MD   BP 136/82 mmHg  Pulse 63  Temp(Src) 98.2 F (36.8 C) (Oral)  Resp 15  SpO2 95% Physical Exam  Constitutional: She is oriented to person, place, and time. She appears well-developed and well-nourished. No distress.  HENT:  Head: Normocephalic and atraumatic.  Eyes: Pupils are equal, round, and reactive to light.  Neck: Normal range of motion.  Cardiovascular: Normal rate and intact distal pulses.   Pulmonary/Chest: No respiratory distress.  Abdominal: Normal appearance. She exhibits no distension. There is tenderness. There is no rigidity, no rebound and no guarding.    Musculoskeletal: Normal range of motion.  Neurological: She is alert and oriented to person, place, and time. No cranial nerve deficit.  Skin: Skin is warm and dry. No rash noted.  Psychiatric: She has a normal mood and affect. Her behavior is normal.  Nursing note and vitals reviewed.   ED Course  Procedures (including critical care time) Medications  pantoprazole (  PROTONIX) injection 40 mg (40 mg Intravenous Given 04/11/16 2142)    Labs Review Labs Reviewed  COMPREHENSIVE METABOLIC PANEL - Abnormal; Notable for the following:    Potassium 3.4 (*)    All other components within normal limits  CBC - Abnormal; Notable for the following:    MCV 73.1 (*)    MCH 25.5 (*)    All other components within normal limits  LIPASE, BLOOD  OCCULT BLOOD X 1 CARD TO LAB, STOOL  POC OCCULT BLOOD, ED  TYPE AND SCREEN    Imaging Review No results found. I have personally reviewed and evaluated these images and lab results as part of my medical  decision-making.  Stool was positive for blood.  Patient was admitted with the diagnosis of active of GI bleed probably gastric ulcer.  MDM   Final diagnoses:  Gastrointestinal hemorrhage associated with gastric ulcer        Leonard Schwartz, MD 04/11/16 2237

## 2016-04-11 NOTE — H&P (Signed)
[facility] History and Physical  Emily [name] [id number] DOB: 05-18-1956 DOA: 04/11/2016  Referring physician: EDP PCP: Robyn Haber, MD   Chief Complaint: Epigastric pain, melena   HPI: Emily [name] is a 60 y.o. female who presents to the ED with 48 hour history of epigastric pain and 24 hour history of melena.  Patient has h/o gastritis in past, diagnosed on EGD by [facility] in March of last year.  No vomiting, no chest pain, no fevers nor chills.  Review of Systems: 10 Systems reviewed.  As above, otherwise negative  Past Medical History  Diagnosis Date  . Hypertension   . Hyperlipidemia   . Back pain   . GERD (gastroesophageal reflux disease)   . Colon polyps   . Arthritis    Past Surgical History  Procedure Laterality Date  . Abdominal hysterectomy     Social History:  reports that she has never smoked. She has never used smokeless tobacco. She reports that she does not drink alcohol or use illicit drugs.  Allergies  Allergen Reactions  . Aspirin Hives  . Nsaids Hives    Family History  Problem Relation Age of Onset  . Aneurysm Mother   . Diabetes Father   . Colon cancer Brother 51     Prior to Admission medications   Medication Sig Start Date End Date Taking? Authorizing Provider  esomeprazole (NEXIUM) 40 MG capsule Take 1 capsule (40 mg total) by mouth daily at 12 noon. 10/17/15  Yes Robyn Haber, MD  metoprolol succinate (TOPROL-XL) 25 MG 24 hr tablet TAKE 1 TABLET BY MOUTH EVERY MORNING 03/25/16  Yes Chelle Jeffery, PA-C  NIFEdipine (PROCARDIA-XL/ADALAT CC) 30 MG 24 hr tablet TAKE 1 TABLET (30 MG) BY MOUTH DAILY 01/04/16  Yes Wendie Agreste, MD  rosuvastatin (CRESTOR) 20 MG tablet TAKE 1 TABLET BY MOUTH EVERY DAY. 10/17/15  Yes Robyn Haber, MD  ALPRAZolam Duanne Moron) 0.25 MG tablet Take 1 tablet (0.25 mg total) by mouth 3 (three) times daily as needed for anxiety. 10/17/15   Robyn Haber, MD   Physical Exam: Filed  Vitals:   04/11/16 2125 04/11/16 2200  BP: 122/71 136/82  Pulse: 65 63  Temp:    Resp: 13 15    BP 136/82 mmHg  Pulse 63  Temp(Src) 98.2 F (36.8 C) (Oral)  Resp 15  SpO2 95%  General Appearance:    Alert, oriented, no distress, appears stated age  Head:    Normocephalic, atraumatic  Eyes:    PERRL, EOMI, sclera non-icteric        Nose:   Nares without drainage or epistaxis. Mucosa, turbinates normal  Throat:   Moist mucous membranes. Oropharynx without erythema or exudate.  Neck:   Supple. No carotid bruits.  No thyromegaly.  No lymphadenopathy.   Back:     No CVA tenderness, no spinal tenderness  Lungs:     Clear to auscultation bilaterally, without wheezes, rhonchi or rales  Chest wall:    No tenderness to palpitation  Heart:    Regular rate and rhythm without murmurs, gallops, rubs  Abdomen:     Soft, non-tender, nondistended, normal bowel sounds, no organomegaly  Genitalia:    deferred  Rectal:    deferred  Extremities:   No clubbing, cyanosis or edema.  Pulses:   2+ and symmetric all extremities  Skin:   Skin color, texture, turgor normal, no rashes or lesions  Lymph nodes:   Cervical, supraclavicular, and axillary nodes normal  Neurologic:  CNII-XII intact. Normal strength, sensation and reflexes      throughout    Labs on Admission:  Basic Metabolic Panel:  Recent Labs Lab 04/11/16 1851  NA 139  K 3.4*  CL 106  CO2 24  GLUCOSE 94  BUN 16  CREATININE 0.53  CALCIUM 9.4   Liver Function Tests:  Recent Labs Lab 04/11/16 1851  AST 21  ALT 24  ALKPHOS 116  BILITOT 0.4  PROT 7.6  ALBUMIN 4.0    Recent Labs Lab 04/11/16 1851  LIPASE 26   No results for input(s): AMMONIA in the last 168 hours. CBC:  Recent Labs Lab 04/11/16 1851  WBC 8.1  HGB 12.7  HCT 36.4  MCV 73.1*  PLT 262   Cardiac Enzymes: No results for input(s): CKTOTAL, CKMB, CKMBINDEX, TROPONINI in the last 168 hours.  BNP (last 3 results) No results for input(s): PROBNP  in the last 8760 hours. CBG: No results for input(s): GLUCAP in the last 168 hours.  Radiological Exams on Admission: No results found.  EKG: Independently reviewed.  Assessment/Plan Principal Problem:   Melena   1. Melena -  1. Call [facility] in AM for likely EGD 2. NPO 3. protonix IV 4. Repeat CBC in AM, transfuse if needed, patient initial HGB is 12.7 in ED 5. Tele monitor    Code Status: Full Family Communication: Husband at bedside, son on phone Disposition Plan: Admit to obs   Time spent: 70 min  GARDNER, JARED M. [facility] Pager [phone]  If 7AM-7PM, please contact the day team taking care of the patient [email or web address] Password [email or web address] 04/11/2016, 10:52 PM       `

## 2016-04-11 NOTE — ED Notes (Signed)
Here yesterday with similar symptoms. Today epigastric pain worsening, she had 3 episodes of black tarry stools this afternoon. Denies vomiting

## 2016-04-12 ENCOUNTER — Encounter (HOSPITAL_COMMUNITY): Payer: Self-pay | Admitting: Internal Medicine

## 2016-04-12 DIAGNOSIS — G8929 Other chronic pain: Secondary | ICD-10-CM

## 2016-04-12 DIAGNOSIS — R1013 Epigastric pain: Secondary | ICD-10-CM | POA: Diagnosis not present

## 2016-04-12 DIAGNOSIS — K921 Melena: Secondary | ICD-10-CM | POA: Diagnosis not present

## 2016-04-12 DIAGNOSIS — K219 Gastro-esophageal reflux disease without esophagitis: Secondary | ICD-10-CM | POA: Diagnosis present

## 2016-04-12 DIAGNOSIS — I10 Essential (primary) hypertension: Secondary | ICD-10-CM

## 2016-04-12 DIAGNOSIS — D649 Anemia, unspecified: Secondary | ICD-10-CM | POA: Diagnosis not present

## 2016-04-12 LAB — CBC
HEMATOCRIT: 34 % — AB (ref 36.0–46.0)
HEMOGLOBIN: 11.4 g/dL — AB (ref 12.0–15.0)
MCH: 24.6 pg — AB (ref 26.0–34.0)
MCHC: 33.5 g/dL (ref 30.0–36.0)
MCV: 73.4 fL — ABNORMAL LOW (ref 78.0–100.0)
Platelets: 243 10*3/uL (ref 150–400)
RBC: 4.63 MIL/uL (ref 3.87–5.11)
RDW: 14.9 % (ref 11.5–15.5)
WBC: 6.8 10*3/uL (ref 4.0–10.5)

## 2016-04-12 LAB — BASIC METABOLIC PANEL
Anion gap: 8 (ref 5–15)
BUN: 11 mg/dL (ref 6–20)
CALCIUM: 8.9 mg/dL (ref 8.9–10.3)
CHLORIDE: 109 mmol/L (ref 101–111)
CO2: 26 mmol/L (ref 22–32)
CREATININE: 0.58 mg/dL (ref 0.44–1.00)
GFR calc Af Amer: >60 mL/min (ref >60)
GFR calc non Af Amer: >60 mL/min (ref >60)
Glucose, Bld: 91 mg/dL (ref 65–99)
Potassium: 3.5 mmol/L (ref 3.5–5.1)
SODIUM: 143 mmol/L (ref 135–145)

## 2016-04-12 LAB — OCCULT BLOOD, POC DEVICE
FECAL OCCULT BLD: POSITIVE — AB
Fecal Occult Bld: POSITIVE — AB

## 2016-04-12 LAB — HEMOGLOBIN AND HEMATOCRIT, BLOOD
HCT: 33.2 % — ABNORMAL LOW (ref 36.0–46.0)
HEMOGLOBIN: 11 g/dL — AB (ref 12.0–15.0)

## 2016-04-12 LAB — ABO/RH: ABO/RH(D): O POS

## 2016-04-12 MED ORDER — POTASSIUM CHLORIDE CRYS ER 20 MEQ PO TBCR
40.0000 meq | EXTENDED_RELEASE_TABLET | Freq: Once | ORAL | Status: AC
  Administered 2016-04-12: 40 meq via ORAL
  Filled 2016-04-12: qty 2

## 2016-04-12 MED ORDER — ONDANSETRON HCL 4 MG/2ML IJ SOLN
4.0000 mg | Freq: Four times a day (QID) | INTRAMUSCULAR | Status: DC | PRN
  Administered 2016-04-12: 4 mg via INTRAVENOUS
  Filled 2016-04-12: qty 2

## 2016-04-12 MED ORDER — SODIUM CHLORIDE 0.9 % IV BOLUS (SEPSIS)
1000.0000 mL | Freq: Once | INTRAVENOUS | Status: AC
  Administered 2016-04-12: 1000 mL via INTRAVENOUS

## 2016-04-12 MED ORDER — ACETAMINOPHEN 650 MG RE SUPP
650.0000 mg | Freq: Four times a day (QID) | RECTAL | Status: DC | PRN
  Administered 2016-04-12: 650 mg via RECTAL
  Filled 2016-04-12: qty 1

## 2016-04-12 MED ORDER — ACETAMINOPHEN 325 MG PO TABS: 650.0000 mg | ORAL_TABLET | Freq: Four times a day (QID) | ORAL | Status: DC | PRN

## 2016-04-12 MED ORDER — PANTOPRAZOLE SODIUM 40 MG IV SOLR
40.0000 mg | Freq: Two times a day (BID) | INTRAVENOUS | Status: DC
  Administered 2016-04-12: 40 mg via INTRAVENOUS
  Filled 2016-04-12: qty 40

## 2016-04-12 NOTE — Consult Note (Signed)
Referring Provider: No ref. provider found Primary Care Physician:  Robyn Haber, MD Primary Gastroenterologist:  Dr. Ardis Hughs  Reason for Consultation:  Epigastric pain and melena  HPI: Emily [name] is a 60 y.o. female with limited PMH who presented to [facility] hospital last night with complaints of epigastric abdominal pain that began 3 days ago and black stools for the past 2 days.  Denies NSAID use.  Is on Nexium 40 mg daily at home and is on BID IV PPI here.  Patient speaks primarily [place], but her son and and daughter were in the room and provided most of the information via translation.  Hgb was 12.7 grams on admission and is 11.4 grams this AM.  BMP normal.  She had an EGD in 02/2015 by Dr. Ardis Hughs that showed a 3 cm hiatal hernia and some non-specific gastritis that was Hpylori negative on biopsy.   Past Medical History  Diagnosis Date  . Hypertension   . Hyperlipidemia   . Back pain   . GERD (gastroesophageal reflux disease)   . Colon polyps   . Arthritis     Past Surgical History  Procedure Laterality Date  . Abdominal hysterectomy    . Colonoscopy    . Esophagogastroduodenoscopy      Prior to Admission medications   Medication Sig Start Date End Date Taking? Authorizing Provider  esomeprazole (NEXIUM) 40 MG capsule Take 1 capsule (40 mg total) by mouth daily at 12 noon. 10/17/15  Yes Robyn Haber, MD  metoprolol succinate (TOPROL-XL) 25 MG 24 hr tablet TAKE 1 TABLET BY MOUTH EVERY MORNING 03/25/16  Yes Chelle Jeffery, PA-C  NIFEdipine (PROCARDIA-XL/ADALAT CC) 30 MG 24 hr tablet TAKE 1 TABLET (30 MG) BY MOUTH DAILY 01/04/16  Yes Wendie Agreste, MD  rosuvastatin (CRESTOR) 20 MG tablet TAKE 1 TABLET BY MOUTH EVERY DAY. 10/17/15  Yes Robyn Haber, MD  ALPRAZolam Duanne Moron) 0.25 MG tablet Take 1 tablet (0.25 mg total) by mouth 3 (three) times daily as needed for anxiety. 10/17/15   Robyn Haber, MD    Current Facility-Administered Medications  Medication  Dose Route Frequency Provider Last Rate Last Dose  . 0.9 %  sodium chloride infusion   Intravenous Continuous Etta Quill, DO 125 mL/hr at 04/12/16 0742 125 mL/hr at 04/12/16 0742  . acetaminophen (TYLENOL) suppository 650 mg  650 mg Rectal Q6H PRN Gardiner Barefoot, NP   650 mg at 04/12/16 0045  . ALPRAZolam Duanne Moron) tablet 0.25 mg  0.25 mg Oral TID PRN Etta Quill, DO      . metoprolol succinate (TOPROL-XL) 24 hr tablet 25 mg  25 mg Oral q morning - 10a Etta Quill, DO   25 mg at 04/12/16 1005  . morphine 2 MG/ML injection 2-4 mg  2-4 mg Intravenous Q4H PRN Etta Quill, DO   2 mg at 04/12/16 0816  . NIFEdipine (PROCARDIA-XL/ADALAT CC) 24 hr tablet 30 mg  30 mg Oral Daily Etta Quill, DO   30 mg at 04/12/16 1005  . ondansetron (ZOFRAN) injection 4 mg  4 mg Intravenous Q6H PRN Gardiner Barefoot, NP   4 mg at 04/12/16 0045  . pantoprazole (PROTONIX) injection 40 mg  40 mg Intravenous Q12H Jessica D Zehr, PA-C      . rosuvastatin (CRESTOR) tablet 20 mg  20 mg Oral Daily Etta Quill, DO        Allergies as of 04/11/2016 - Review Complete 04/11/2016  Allergen Reaction Noted  . Aspirin  Hives   . Nsaids Hives 07/15/2009    Family History  Problem Relation Age of Onset  . Aneurysm Mother   . Diabetes Father   . Colon cancer Brother 16    Social History   Social History  . Marital Status: Married    Spouse Name: [name]  . Number of Children: 3  . Years of Education: [name]   Occupational History  . Not on file.   Social History Main Topics  . Smoking status: Never Smoker   . Smokeless tobacco: Never Used  . Alcohol Use: No  . Drug Use: No  . Sexual Activity: Not on file   Other Topics Concern  . Not on file   Social History Narrative    Review of Systems: Ten point ROS is O/W negative except as mentioned in HPI.  Physical Exam: Vital signs in last 24 hours: Temp:  [98.1 F (36.7 C)-98.5 F (36.9 C)] 98.1 F (36.7 C) (04/20 0625) Pulse Rate:   [63-73] 64 (04/20 0625) Resp:  [13-18] 16 (04/20 0625) BP: (111-136)/(66-82) 111/66 mmHg (04/20 0625) SpO2:  [95 %-99 %] 96 % (04/20 0625) Weight:  [184 lb 4.8 oz (83.598 kg)] 184 lb 4.8 oz (83.598 kg) (04/19 2340) Last BM Date: 04/11/16 General:  Alert, Well-developed, well-nourished, pleasant and cooperative in NAD Head:  Normocephalic and atraumatic. Eyes:  Sclera clear, no icterus.  Conjunctiva pink. Ears:  Normal auditory acuity. Mouth:  No deformity or lesions.   Lungs:  Clear throughout to auscultation.  No wheezes, crackles, or rhonchi.  Heart:  Regular rate and rhythm; no murmurs, clicks, rubs, or gallops. Abdomen:  Soft, non-distended.  BS present.  Epigastric TTP. Msk:  Symmetrical without gross deformities. Pulses:  Normal pulses noted. Extremities:  Without clubbing or edema. Neurologic:  Alert and  oriented x4;  grossly normal neurologically. Skin:  Intact without significant lesions or rashes. Psych:  Alert and cooperative. Normal mood and affect.   Lab Results:  Recent Labs  04/11/16 1851 04/12/16 0417  WBC 8.1 6.8  HGB 12.7 11.4*  HCT 36.4 34.0*  PLT 262 243   BMET  Recent Labs  04/11/16 1851 04/12/16 0417  NA 139 143  K 3.4* 3.5  CL 106 109  CO2 24 26  GLUCOSE 94 91  BUN 16 11  CREATININE 0.53 0.58  CALCIUM 9.4 8.9   LFT  Recent Labs  04/11/16 1851  PROT 7.6  ALBUMIN 4.0  AST 21  ALT 24  ALKPHOS 116  BILITOT 0.4   IMPRESSION:  -Epigastric abdominal pain and melena:  ? Ulcer vs Cameron's ulcers/lesions/erosions from her hiatal hernia, etc.  She does not use NSAIDs and was Hpylori negative last year at EGD.  PLAN: -Will plan for EGD tomorrow. 4/21. -Full liquids for now then NPO after midnight. -Continue BID PPI for now. -Monitor Hgb.  Alonza Bogus, PA-C Pager number [phone]    [facility] Attending   I have taken an interval history, reviewed the chart and examined the patient. I agree with the Advanced Practitioner's note,  impression and recommendations. The risks and benefits as well as alternatives of endoscopic procedure(s) have been discussed and reviewed. All questions answered. The patient agrees to proceed.   Sounds like an upper GI bleed and suspicious for PUD but she has chronic dyspeptic sx, neg EGD last year, and is on PPI so not clear. EGD is next step here. Ferritin 100 last year - more microcytic - I will recheck. If EGD negative she  should have a capsule endoscopy I think.  Gatha Mayer, MD, [facility] [phone] (pager) [phone] after 5 PM, weekends and holidays  04/12/2016 1:18 PM

## 2016-04-12 NOTE — Progress Notes (Signed)
PROGRESS NOTE    Emily [name] [facility]  [id number] DOB: December 06, 1956 DOA: 04/11/2016 PCP: Robyn Haber, MD Outpatient Specialists:     Brief Narrative:  Patient is a 60 year old Hispanic female with history of hiatal hernia and nonspecific gastritis H pylori negative per EGD on March 2016 presented to the ED with a 2 day history of melanotic stools and epigastric abdominal pain. Patient admitted for consent for upper GI bleed and placed on a PPI. GI consulted. Patient for upper endoscopy 04/13/2016.   Assessment & Plan:   Principal Problem:   Melena Active Problems:   HTN (hypertension)   Anxiety   GERD (gastroesophageal reflux disease)  #1 melena Patient with melena and epigastric abdominal pain concern for upper GI bleed versus peptic ulcer disease. Patient denies any NSAID use. Hemoglobin currently stable. Serial H&H. Continue IV PPI. GI has been consulted and patient for upper endoscopy tomorrow. Per GI.  #2 hypertension BP is borderline. Hold antihypertensive medications.  #3 gastroesophageal reflux disease  PPI.   DVT prophylaxis: SCDs Code Status: Full Family Communication: Updated patient, son, daughter at bedside. Disposition Plan: Home when medically stable and melena resolved.   Consultants:   Gastroenterology: Dr. Carlean Purl 04/12/2016  Procedures:   None  Antimicrobials:   None   Subjective: Patient denies any further melanotic stools. Patient still with epigastric abdominal pain. No chest pain. No shortness of breath.  Objective: Filed Vitals:   04/11/16 2200 04/11/16 2340 04/12/16 0625 04/12/16 1456  BP: 136/82 127/71 111/66 97/52  Pulse: 63 65 64 55  Temp:  98.5 F (36.9 C) 98.1 F (36.7 C) 98.6 F (37 C)  TempSrc:  Oral Oral Oral  Resp: 15 16 16 20   Height:  5\' 6"  (1.676 m)    Weight:  83.598 kg (184 lb 4.8 oz)    SpO2: 95% 98% 96% 95%    Intake/Output Summary (Last 24 hours) at 04/12/16 1855 Last data filed at 04/12/16 1500  Gross per 24 hour  Intake 1152.15 ml  Output    850 ml  Net 302.15 ml   Filed Weights   04/11/16 2340  Weight: 83.598 kg (184 lb 4.8 oz)    Examination:  General exam: Appears calm and comfortable  Respiratory system: Clear to auscultation. Respiratory effort normal. Cardiovascular system: S1 & S2 heard, RRR. No JVD, murmurs, rubs, gallops or clicks. No pedal edema. Gastrointestinal system: Abdomen is nondistended, soft and tender to palpation in the epigastrium, positive bowel sounds. No organomegaly or masses felt. Normal bowel sounds heard. Central nervous system: Alert and oriented. No focal neurological deficits. Extremities: Symmetric 5 x 5 power. Skin: No rashes, lesions or ulcers Psychiatry: Judgement and insight appear normal. Mood & affect appropriate.     Data Reviewed: I have personally reviewed following labs and imaging studies  CBC:  Recent Labs Lab 04/11/16 1851 04/12/16 0417  WBC 8.1 6.8  HGB 12.7 11.4*  HCT 36.4 34.0*  MCV 73.1* 73.4*  PLT 262 0000000   Basic Metabolic Panel:  Recent Labs Lab 04/11/16 1851 04/12/16 0417  NA 139 143  K 3.4* 3.5  CL 106 109  CO2 24 26  GLUCOSE 94 91  BUN 16 11  CREATININE 0.53 0.58  CALCIUM 9.4 8.9   GFR: Estimated Creatinine Clearance: 83.5 mL/min (by C-G formula based on Cr of 0.58). Liver Function Tests:  Recent Labs Lab 04/11/16 1851  AST 21  ALT 24  ALKPHOS 116  BILITOT 0.4  PROT 7.6  ALBUMIN 4.0    Recent  Labs Lab 04/11/16 1851  LIPASE 26   No results for input(s): AMMONIA in the last 168 hours. Coagulation Profile: No results for input(s): INR, PROTIME in the last 168 hours. Cardiac Enzymes: No results for input(s): CKTOTAL, CKMB, CKMBINDEX, TROPONINI in the last 168 hours. BNP (last 3 results) No results for input(s): PROBNP in the last 8760 hours. HbA1C: No results for input(s): HGBA1C in the last 72 hours. CBG: No results for input(s): GLUCAP in the last 168 hours. Lipid  Profile: No results for input(s): CHOL, HDL, LDLCALC, TRIG, CHOLHDL, LDLDIRECT in the last 72 hours. Thyroid Function Tests: No results for input(s): TSH, T4TOTAL, FREET4, T3FREE, THYROIDAB in the last 72 hours. Anemia Panel: No results for input(s): VITAMINB12, FOLATE, FERRITIN, TIBC, IRON, RETICCTPCT in the last 72 hours. Urine analysis:    Component Value Date/Time   BILIRUBINUR negative 10/17/2015 1905   BILIRUBINUR neg 12/19/2014 1718   KETONESUR negative 10/17/2015 1905   PROTEINUR negative 10/17/2015 1905   PROTEINUR neg 12/19/2014 1718   UROBILINOGEN 0.2 10/17/2015 1905   NITRITE Negative 10/17/2015 1905   NITRITE neg 12/19/2014 1718   LEUKOCYTESUR Trace* 10/17/2015 1905   Sepsis Labs: @LABRCNTIP (procalcitonin:4,lacticidven:4)  )No results found for this or any previous visit (from the past 240 hour(s)).       Radiology Studies: No results found.      Scheduled Meds: . metoprolol succinate  25 mg Oral q morning - 10a  . NIFEdipine  30 mg Oral Daily  . pantoprazole (PROTONIX) IV  40 mg Intravenous Q12H  . rosuvastatin  20 mg Oral Daily  . sodium chloride  1,000 mL Intravenous Once   Continuous Infusions: . sodium chloride 125 mL/hr at 04/12/16 1734        Time spent: 40 minutes    Tonisha Silvey, MD [facility] Pager [phone]  If 7PM-7AM, please contact night-coverage [email or web address] Password [facility] 04/12/2016, 6:55 PM

## 2016-04-13 ENCOUNTER — Observation Stay (HOSPITAL_COMMUNITY): Payer: BLUE CROSS/BLUE SHIELD | Admitting: Anesthesiology

## 2016-04-13 ENCOUNTER — Encounter (HOSPITAL_COMMUNITY): Payer: Self-pay | Admitting: *Deleted

## 2016-04-13 ENCOUNTER — Encounter (HOSPITAL_COMMUNITY)
Admission: EM | Disposition: A | Discharge status: Home / Self Care | Payer: Self-pay | Source: Home / Self Care | Attending: Emergency Medicine

## 2016-04-13 DIAGNOSIS — K921 Melena: Secondary | ICD-10-CM | POA: Diagnosis not present

## 2016-04-13 DIAGNOSIS — K219 Gastro-esophageal reflux disease without esophagitis: Secondary | ICD-10-CM | POA: Diagnosis not present

## 2016-04-13 DIAGNOSIS — F419 Anxiety disorder, unspecified: Secondary | ICD-10-CM | POA: Diagnosis not present

## 2016-04-13 DIAGNOSIS — I10 Essential (primary) hypertension: Secondary | ICD-10-CM | POA: Diagnosis not present

## 2016-04-13 HISTORY — PX: ESOPHAGOGASTRODUODENOSCOPY: SHX5428

## 2016-04-13 LAB — BASIC METABOLIC PANEL WITH GFR
Anion gap: 6 (ref 5–15)
BUN: 6 mg/dL (ref 6–20)
CO2: 28 mmol/L (ref 22–32)
Calcium: 9.1 mg/dL (ref 8.9–10.3)
Chloride: 109 mmol/L (ref 101–111)
Creatinine, Ser: 0.49 mg/dL (ref 0.44–1.00)
GFR calc Af Amer: >60 mL/min
GFR calc non Af Amer: >60 mL/min
Glucose, Bld: 90 mg/dL (ref 65–99)
Potassium: 3.8 mmol/L (ref 3.5–5.1)
Sodium: 143 mmol/L (ref 135–145)

## 2016-04-13 LAB — CBC
HCT: 37 % (ref 36.0–46.0)
Hemoglobin: 12.1 g/dL (ref 12.0–15.0)
MCH: 24.8 pg — ABNORMAL LOW (ref 26.0–34.0)
MCHC: 32.7 g/dL (ref 30.0–36.0)
MCV: 76 fL — ABNORMAL LOW (ref 78.0–100.0)
Platelets: 251 K/uL (ref 150–400)
RBC: 4.87 MIL/uL (ref 3.87–5.11)
RDW: 15 % (ref 11.5–15.5)
WBC: 5.7 K/uL (ref 4.0–10.5)

## 2016-04-13 LAB — FERRITIN: Ferritin: 79 ng/mL (ref 11–307)

## 2016-04-13 SURGERY — EGD (ESOPHAGOGASTRODUODENOSCOPY)
Anesthesia: Monitor Anesthesia Care

## 2016-04-13 MED ORDER — SODIUM CHLORIDE 0.9 % IV SOLN: INTRAVENOUS | Status: DC

## 2016-04-13 MED ORDER — PANTOPRAZOLE SODIUM 40 MG PO TBEC
40.0000 mg | DELAYED_RELEASE_TABLET | Freq: Every day | ORAL | Status: DC
  Administered 2016-04-13: 40 mg via ORAL
  Filled 2016-04-13: qty 1

## 2016-04-13 MED ORDER — AMITRIPTYLINE HCL 25 MG PO TABS: 25.0000 mg | ORAL_TABLET | Freq: Every day | ORAL | Status: DC

## 2016-04-13 MED ORDER — LACTATED RINGERS IV SOLN
INTRAVENOUS | Status: DC
  Administered 2016-04-13: 1000 mL via INTRAVENOUS

## 2016-04-13 MED ORDER — PROPOFOL 500 MG/50ML IV EMUL
INTRAVENOUS | Status: DC | PRN
  Administered 2016-04-13 (×2): 30 mg via INTRAVENOUS

## 2016-04-13 MED ORDER — PROPOFOL 500 MG/50ML IV EMUL
INTRAVENOUS | Status: DC | PRN
  Administered 2016-04-13: 100 ug/kg/min via INTRAVENOUS

## 2016-04-13 MED ORDER — METHYLPREDNISOLONE SODIUM SUCC 40 MG IJ SOLR
40.0000 mg | Freq: Once | INTRAMUSCULAR | Status: AC
  Administered 2016-04-13: 40 mg via INTRAVENOUS
  Filled 2016-04-13: qty 1

## 2016-04-13 NOTE — Anesthesia Preprocedure Evaluation (Addendum)
Anesthesia Evaluation  Patient identified by MRN, date of birth, ID band Patient awake    Reviewed: Allergy & Precautions, NPO status , Patient's Chart, lab work & pertinent test results  Airway Mallampati: II  TM Distance: >3 FB Neck ROM: Full    Dental no notable dental hx.    Pulmonary neg pulmonary ROS,    Pulmonary exam normal breath sounds clear to auscultation       Cardiovascular Exercise Tolerance: Good hypertension, Pt. on medications and Pt. on home beta blockers Normal cardiovascular exam Rhythm:Regular Rate:Normal     Neuro/Psych Anxiety negative neurological ROS     GI/Hepatic Neg liver ROS, GERD  Medicated,  Endo/Other  negative endocrine ROS  Renal/GU negative Renal ROS  negative genitourinary   Musculoskeletal  (+) Arthritis ,   Abdominal   Peds negative pediatric ROS (+)  Hematology negative hematology ROS (+)   Anesthesia Other Findings   Reproductive/Obstetrics negative OB ROS                             Anesthesia Physical Anesthesia Plan  ASA: II  Anesthesia Plan: MAC   Post-op Pain Management:    Induction: Intravenous  Airway Management Planned: Natural Airway  Additional Equipment:   Intra-op Plan:   Post-operative Plan:   Informed Consent: I have reviewed the patients History and Physical, chart, labs and discussed the procedure including the risks, benefits and alternatives for the proposed anesthesia with the patient or authorized representative who has indicated his/her understanding and acceptance.   Dental advisory given  Plan Discussed with: CRNA  Anesthesia Plan Comments:         Anesthesia Quick Evaluation

## 2016-04-13 NOTE — Discharge Summary (Signed)
Physician Discharge Summary  Emily [name] [id number] DOB: 1956/03/29 DOA: 04/11/2016  PCP: Robyn Haber, MD  Admit date: 04/11/2016 Discharge date: 04/13/2016  Time spent: 65 minutes  Recommendations for Outpatient Follow-up:  1. Follow-up with Robyn Haber, MD in 1-2 weeks.   Discharge Diagnoses:  Principal Problem:   Black stools Active Problems:   HTN (hypertension)   Anxiety   GERD (gastroesophageal reflux disease)   Discharge Condition: Stable and improved  Diet recommendation: Regular  Filed Weights   04/11/16 2340 04/13/16 0821  Weight: 83.598 kg (184 lb 4.8 oz) 83.462 kg (184 lb)    History of present illness:  Per Dr Tera Helper is a 60 y.o. female who presented to the ED with 48 hour history of epigastric pain and 24 hour history of melena. Patient has h/o gastritis in past, diagnosed on EGD by [facility] in March of last year.  No vomiting, no chest pain, no fevers nor chills.   Hospital Course:  #1 Black stools Patient had presented with melena and epigastric abdominal pain concern for upper GI bleed versus peptic ulcer disease. Patient denied any NSAID use. Hemoglobin remained stable. Patient was placed on IV PPI. GI was consulted and patient seen by Dr Carlean Purl and underwent upper endoscopy 04/13/2016 which was unremarkable. It was felt per GI that patient had black stools from pepto bismol that patient took before admission due to diarrhea. H/H remained stable. Out patient follow up with PCP.   #2 hypertension BP was borderline. Held antihypertensive medications during the hospitalization.  #3 gastroesophageal reflux disease  Patient placed on PPI.  #4 chronic back pain/insomnia Patient noted to have a history of chronic back pain. Patient was started on amitriptyline 25 mg daily at bedtime per GI recommendations for patient to follow-up with Dr. Carlis Stable in the outpatient setting.  Procedures:  Upper endoscopy: Dr  Carlean Purl 04/13/2016    Consultations:  Gastroenterology: Dr. Carlean Purl 04/12/2016  Discharge Exam: Filed Vitals:   04/13/16 0920 04/13/16 0930  BP: 157/91 162/90  Pulse: 57 64  Temp:    Resp: 13 12    General: NAD Cardiovascular: RRR Respiratory: CTAB  Discharge Instructions   Discharge Instructions    Diet general    Complete by:  As directed      Discharge instructions    Complete by:  As directed   Follow up with Robyn Haber, MD in 1-2 weeks. Follow up with Dr Nelva Bush in 2 weeks or as scheduled.     Increase activity slowly    Complete by:  As directed           Current Discharge Medication List    START taking these medications   Details  amitriptyline (ELAVIL) 25 MG tablet Take 1 tablet (25 mg total) by mouth at bedtime. Qty: 30 tablet, Refills: 0      CONTINUE these medications which have NOT CHANGED   Details  esomeprazole (NEXIUM) 40 MG capsule Take 1 capsule (40 mg total) by mouth daily at 12 noon. Qty: 90 capsule, Refills: 1   Associated Diagnoses: Hiatal hernia; Gastroesophageal reflux disease, esophagitis presence not specified    metoprolol succinate (TOPROL-XL) 25 MG 24 hr tablet TAKE 1 TABLET BY MOUTH EVERY MORNING Qty: 30 tablet, Refills: 0    NIFEdipine (PROCARDIA-XL/ADALAT CC) 30 MG 24 hr tablet TAKE 1 TABLET (30 MG) BY MOUTH DAILY Qty: 90 tablet, Refills: 0    rosuvastatin (CRESTOR) 20 MG tablet TAKE 1 TABLET BY MOUTH EVERY DAY. Qty: 90 tablet, Refills:  1   Associated Diagnoses: Hyperlipidemia    ALPRAZolam (XANAX) 0.25 MG tablet Take 1 tablet (0.25 mg total) by mouth 3 (three) times daily as needed for anxiety. Qty: 30 tablet, Refills: 0   Associated Diagnoses: Anxiety       Allergies  Allergen Reactions  . Aspirin Hives  . Nsaids Hives   Follow-up Information    Follow up with Robyn Haber, MD. Schedule an appointment as soon as possible for a visit in 1 week.   Specialty:  Family Medicine   Why:  f/u in 1-2 weeks    Contact information:   [place] [id number] [phone]       Please follow up.   Why:  f/u with Dr Nelva Bush as scheduled.       The results of significant diagnostics from this hospitalization (including imaging, microbiology, ancillary and laboratory) are listed below for reference.    Significant Diagnostic Studies: No results found.  Microbiology: No results found for this or any previous visit (from the past 240 hour(s)).   Labs: Basic Metabolic Panel:  Recent Labs Lab 04/11/16 1851 04/12/16 0417 04/13/16 0455  NA 139 143 143  K 3.4* 3.5 3.8  CL 106 109 109  CO2 24 26 28   GLUCOSE 94 91 90  BUN 16 11 6   CREATININE 0.53 0.58 0.49  CALCIUM 9.4 8.9 9.1   Liver Function Tests:  Recent Labs Lab 04/11/16 1851  AST 21  ALT 24  ALKPHOS 116  BILITOT 0.4  PROT 7.6  ALBUMIN 4.0    Recent Labs Lab 04/11/16 1851  LIPASE 26   No results for input(s): AMMONIA in the last 168 hours. CBC:  Recent Labs Lab 04/11/16 1851 04/12/16 0417 04/12/16 1934 04/13/16 0455  WBC 8.1 6.8  --  5.7  HGB 12.7 11.4* 11.0* 12.1  HCT 36.4 34.0* 33.2* 37.0  MCV 73.1* 73.4*  --  76.0*  PLT 262 243  --  251   Cardiac Enzymes: No results for input(s): CKTOTAL, CKMB, CKMBINDEX, TROPONINI in the last 168 hours. BNP: BNP (last 3 results) No results for input(s): BNP in the last 8760 hours.  ProBNP (last 3 results) No results for input(s): PROBNP in the last 8760 hours.  CBG: No results for input(s): GLUCAP in the last 168 hours.     SignedIrine Seal MD.  [facility] 04/13/2016, 3:58 PM

## 2016-04-13 NOTE — Op Note (Addendum)
[facility] Patient Name: Emily [name] Procedure Date: 04/13/2016 MRN: [id number] Attending MD: Gatha Mayer , MD Date of Birth: [birth year] CSN:  Age: 60 Admit Type: Inpatient Procedure:                Upper GI endoscopy Indications:              Heme positive stool, Melena Providers:                Gatha Mayer, MD, Laverta Baltimore, RN, Despina Pole, Technician Referring MD:              Medicines:                Monitored Anesthesia Care Complications:            No immediate complications. Estimated Blood Loss:     Estimated blood loss: none. Procedure:                Pre-Anesthesia Assessment:                           - Prior to the procedure, a History and Physical                            was performed, and patient medications and                            allergies were reviewed. The patient's tolerance of                            previous anesthesia was also reviewed. The risks                            and benefits of the procedure and the sedation                            options and risks were discussed with the patient.                            All questions were answered, and informed consent                            was obtained. Prior Anticoagulants: The patient has                            taken no previous anticoagulant or antiplatelet                            agents. ASA Grade Assessment: II - A patient with                            mild systemic disease. After reviewing the risks  and benefits, the patient was deemed in                            satisfactory condition to undergo the procedure.                           After obtaining informed consent, the endoscope was                            passed under direct vision. Throughout the                            procedure, the patient's blood pressure, pulse, and                            oxygen saturations  were monitored continuously. The                            [id number] scope was introduced through the                            mouth, and advanced to the second part of duodenum.                            The upper GI endoscopy was accomplished without                            difficulty. The patient tolerated the procedure                            well. Scope In: Scope Out: Findings:      The esophagus was normal.      The stomach was normal.      The examined duodenum was normal.      The cardia and gastric fundus were normal on retroflexion. Impression:               - Normal esophagus.                           - Normal stomach.                           - Normal examined duodenum.                           - No specimens collected. Moderate Sedation:      Please see anesthesia notes, moderate sedation not given Recommendation:           - Return patient to hospital ward for ongoing care.                           - SHE HAS CHRONIC EPIGASTRIC PAIN X YRS W/ MULTIPLE                            NEGATIVE [place], CT, LABS AND NOW 2 EGD.  STOOL IS BROWN TODAY                           MILDL ANEMIA AND CHRONICALLY MILDLY MICROCYTIC W/O                            IRON DEFICIENCY - SUSPECT THALASSEMIA                           I AM NOT CONVINCED SHE HAS HAD A "GI BLEED"                           SHE CANNOT TAKE NSAIDS FOR MUSCULOSKELETAL PAIN DUE                            TO HIVES FROM NSAIDS                           I AM RXING 1 DOSE SOLUMEDROL                           NEED TO CONSIDER TCA OR DULOXETINE TX - WILL                            DISCUSS WHEN ABLE AND WE WILL ARRANGE GI F/U AFTER                            DC - MAY NEED CAPSULE ENDO OR REPEAT COLONOSCOPY                            (DOUBT) BUT DOES NOT NEED IN HOUSE - HOME SOON ?                            TODAY                           - Return to GI clinic at appointment to be                             scheduled. Dr. Ardis Hughs                           - Continue present medications.                           - Resume regular diet. Procedure Code(s):        --- Professional ---                           [phone], Esophagogastroduodenoscopy, flexible,                            transoral; diagnostic, including collection of  specimen(s) by brushing or washing, when performed                            (separate procedure) Diagnosis Code(s):        --- Professional ---                           R19.5, Other fecal abnormalities                           K92.1, Melena (includes Hematochezia) CPT copyright 2016 American Medical Association. All rights reserved. The codes documented in this report are preliminary and upon coder review may  be revised to meet current compliance requirements. Gatha Mayer, MD 04/13/2016 9:08:52 AM This report has been signed electronically. Number of Addenda: 1 Addendum Number: 1   Addendum Date: 04/13/2016 9:15:35 AM      DISCUSSED THINGS WITH SON AND PATIENT      1) SHE TOOK PEPTO BISMOL 2 D BEFORE ADMIT DUE TO SOME DIARRHEA - STOOLS       NOW BROWN - NOT A GI BLEED BUT BLACK STOOLS FROM PEPTO BISMOL      2) SHE HAS INSOMINA AND BACK PAIN ALSO - WILL START AMITRIPTYLINE 25 MG       QHS AT DC - PLEASE RX THIS      3) RETURN TO DR. Nelva Bush RE: BACK PAIN AND ALSO RETURN TO PCP      4) IN LIGHT OF THIS WOULD NOT HAVE HER SEE DR. Ardis Hughs (GI) BUT SEE HIM       IF PCP THINKS NEEDED      5) I WILL EXPLAIN IT WILL TAKE 1-2 MONTHS FOR AMITRIPTYLINE TO WORK BUT       PLEASE RE-EMPHASIZE AT DC      CALL us BACK IF ? Gatha Mayer, MD 04/13/2016 9:18:13 AM This report has been signed electronically.

## 2016-04-13 NOTE — Transfer of Care (Signed)
Immediate Anesthesia Transfer of Care Note  Patient: Emily [name] [facility]  Procedure(s) Performed: Procedure(s): ESOPHAGOGASTRODUODENOSCOPY (EGD) ([name])  Patient Location: PACU  Anesthesia Type:MAC  Level of Consciousness: sedated, patient cooperative and responds to stimulation  Airway & Oxygen Therapy: Patient Spontanous Breathing and Patient connected to nasal cannula oxygen  Post-op Assessment: Report given to RN and Post -op Vital signs reviewed and stable  Post vital signs: Reviewed and stable  Last Vitals:  Filed Vitals:   04/13/16 0525 04/13/16 0821  BP: 109/65 133/78  Pulse: 61 68  Temp: 36.8 C 37.2 C  Resp: 20 14    Complications: No apparent anesthesia complications

## 2016-04-13 NOTE — Progress Notes (Addendum)
    EGD NEGATIVE      - SHE HAS CHRONIC EPIGASTRIC PAIN X YRS W/ MULTIPLE NEGATIVE [place], CT, LABS AND NOW 2 EGD.  STOOL IS BROWN TODAY  MILDL ANEMIA AND CHRONICALLY MILDLY MICROCYTIC W/O IRON DEFICIENCY - SUSPECT THALASSEMIA  I AM NOT CONVINCED SHE HAS HAD A "GI BLEED"  SHE CANNOT TAKE NSAIDS FOR MUSCULOSKELETAL PAIN DUE TO HIVES FROM NSAIDS  I AM RXING 1 DOSE SOLUMEDROL  9:18 AM    DISCUSSED THINGS WITH SON AND PATIENT  1) SHE TOOK PEPTO BISMOL 2 D BEFORE ADMIT DUE TO SOME DIARRHEA - STOOLS NOW BROWN - NOT A GI BLEED BUT BLACK STOOLS FROM PEPTO BISMOL  2) SHE HAS INSOMINA AND BACK PAIN ALSO - WILL START AMITRIPTYLINE 25 MG QHS AT DC - PLEASE RX THIS  3) RETURN TO DR. Nelva Bush RE: BACK PAIN AND ALSO RETURN TO PCP  4) IN LIGHT OF THIS WOULD NOT HAVE HER SEE DR. Ardis Hughs (GI) BUT SEE HIM IF PCP THINKS NEEDED  5) I WILL EXPLAIN IT WILL TAKE 1-2 MONTHS FOR AMITRIPTYLINE TO WORK BUT PLEASE RE-EMPHASIZE AT DC  CALL us BACK IF ?

## 2016-04-13 NOTE — Anesthesia Postprocedure Evaluation (Signed)
Anesthesia Post Note  Patient: Emily [name] [facility]  Procedure(s) Performed: Procedure(s) (LRB): ESOPHAGOGASTRODUODENOSCOPY (EGD) ([name])  Patient location during evaluation: PACU Anesthesia Type: MAC Level of consciousness: awake and alert Pain management: pain level controlled Vital Signs Assessment: post-procedure vital signs reviewed and stable Respiratory status: spontaneous breathing, nonlabored ventilation, respiratory function stable and patient connected to nasal cannula oxygen Cardiovascular status: stable and blood pressure returned to baseline Anesthetic complications: no    Last Vitals:  Filed Vitals:   04/13/16 0920 04/13/16 0930  BP: 157/91 162/90  Pulse: 57 64  Temp:    Resp: 13 12    Last Pain:  Filed Vitals:   04/13/16 0932  PainSc: 3                  Aron Inge J

## 2016-04-17 ENCOUNTER — Encounter (HOSPITAL_COMMUNITY): Payer: Self-pay | Admitting: Internal Medicine

## 2016-04-22 ENCOUNTER — Other Ambulatory Visit: Payer: Self-pay | Admitting: Physician Assistant

## 2016-04-25 ENCOUNTER — Ambulatory Visit (INDEPENDENT_AMBULATORY_CARE_PROVIDER_SITE_OTHER): Payer: BLUE CROSS/BLUE SHIELD | Admitting: Family Medicine

## 2016-04-25 VITALS — BP 124/80 | HR 73 | Temp 99.1°F | Resp 17 | Ht 64.5 in | Wt 189.0 lb

## 2016-04-25 DIAGNOSIS — E785 Hyperlipidemia, unspecified: Secondary | ICD-10-CM | POA: Diagnosis not present

## 2016-04-25 DIAGNOSIS — I10 Essential (primary) hypertension: Secondary | ICD-10-CM | POA: Diagnosis not present

## 2016-04-25 DIAGNOSIS — F419 Anxiety disorder, unspecified: Secondary | ICD-10-CM

## 2016-04-25 DIAGNOSIS — D638 Anemia in other chronic diseases classified elsewhere: Secondary | ICD-10-CM

## 2016-04-25 DIAGNOSIS — M546 Pain in thoracic spine: Secondary | ICD-10-CM | POA: Diagnosis not present

## 2016-04-25 MED ORDER — METOPROLOL SUCCINATE ER 25 MG PO TB24: 25.0000 mg | ORAL_TABLET | Freq: Every day | ORAL | Status: DC

## 2016-04-25 MED ORDER — ROSUVASTATIN CALCIUM 20 MG PO TABS: ORAL_TABLET | ORAL | Status: DC

## 2016-04-25 MED ORDER — NIFEDIPINE ER 30 MG PO TB24: ORAL_TABLET | ORAL | Status: DC

## 2016-04-25 MED ORDER — ALPRAZOLAM 0.25 MG PO TABS: 0.2500 mg | ORAL_TABLET | Freq: Three times a day (TID) | ORAL | Status: DC | PRN

## 2016-04-25 NOTE — Patient Instructions (Addendum)
  You should get a phone call from specialists.    We'll look at the right knee after the back is better.   IF you received an x-ray today, you will receive an invoice from [facility]. Please contact [facility] at [phone] with questions or concerns regarding your invoice.   IF you received labwork today, you will receive an invoice from [employer]. Please contact [facility] at [phone] with questions or concerns regarding your invoice.   Our billing staff will not be able to assist you with questions regarding bills from these companies.  You will be contacted with the lab results as soon as they are available. The fastest way to get your results is to activate your My Chart account. Instructions are located on the last page of this paperwork. If you have not heard from [place] regarding the results in 2 weeks, please contact this office.

## 2016-04-25 NOTE — Progress Notes (Signed)
60 yo married woman who works at [employer] for 10 years, very physical job.  She has chronic thoracic back pain and needs refills on her medications.  Patient was found to have a mild anemia several months ago. She underwent endoscopy by Dr. Carlean Purl which was totally normal. She's post be having a follow-up colonoscopy with Dr. Ardis Hughs but she has not received a call yet.  Patient is have mid back pain. She seen Dr. Nelva Bush who gave her shot of Depo-Medrol in the past which afforded relief for about one month. She says that she has quite a bit of lifting at work and this activity or any activity involving bending over causes quite a bit of discomfort. She's never had any physical therapy or chiropractic manipulation.  Patient also complains about right knee pain. She says bending it causes pain. She had an injection by Dr. Rip Harbour in years past and this gave her about 1 month of relief. She was told that she had bone-on-bone.  Objective:BP 124/80 mmHg  Pulse 73  Temp(Src) 99.1 F (37.3 C) (Oral)  Resp 17  Ht 5' 4.5" (1.638 m)  Wt 189 lb (85.73 kg)  BMI 31.95 kg/m2  SpO2 97% Patient appears somewhat depressed but she is able to smile and make eye contact HEENT: Unremarkable Neck: Supple no adenopathy or thyromegaly Chest: Clear Heart: Regular no murmur Abdomen: Soft and nontender without HSM or masses Palpation of the back reveals pain with light palpation from T8 to about L1. Palpation of the right knee reveals diffuse tenderness and some mild crepitus. There is no effusion there. Skin: No rashes  Assessment: Patient appears to be in chronic pain. When we discussed what is helping the past she points out that narcotic pain medicine like morphine seemed to allow her to sleep but then when she woke up she was back to her normal pain afflicted self.  Midline thoracic back pain - Plan: Ambulatory referral to Physical Therapy  Hyperlipidemia - Plan: rosuvastatin (CRESTOR) 20 MG  tablet  Anxiety - Plan: ALPRAZolam (XANAX) 0.25 MG tablet  Essential hypertension - Plan: NIFEdipine (PROCARDIA-XL/ADALAT CC) 30 MG 24 hr tablet, metoprolol succinate (TOPROL-XL) 25 MG 24 hr tablet  Anemia of chronic disease - Plan: Ambulatory referral to Hematology Patient will need colonoscopic evaluation as well  Signed, Carola Frost.D.

## 2016-04-26 ENCOUNTER — Telehealth: Payer: Self-pay

## 2016-04-26 NOTE — Telephone Encounter (Signed)
Pt has been scheduled for follow up and pt aware

## 2016-04-26 NOTE — Telephone Encounter (Signed)
-----   Message from Milus Banister, MD sent at 04/26/2016  7:26 AM EDT ----- Burnis Medin get her back in the office to sort this out, thanks   [place],  She needs next available ROV with me.  Thanks   ----- Message -----    From: Robyn Haber, MD    Sent: 04/25/2016   6:48 PM      To: Milus Banister, MD

## 2016-04-30 ENCOUNTER — Other Ambulatory Visit: Payer: Self-pay | Admitting: Family Medicine

## 2016-04-30 ENCOUNTER — Telehealth: Payer: Self-pay

## 2016-04-30 MED ORDER — PREDNISONE 10 MG PO TABS: 10.0000 mg | ORAL_TABLET | Freq: Every day | ORAL | Status: DC

## 2016-04-30 NOTE — Telephone Encounter (Signed)
As the note explains, the discomfort is musculoskeletal, not infectious.  She has been referred to physical therapy.  I will call in an anti-inflammatory medication

## 2016-04-30 NOTE — Telephone Encounter (Signed)
Pt called saying she was supposed to have an antibiotic sent in at her last visit with Dr. Joseph Art. Please advise.

## 2016-05-01 NOTE — Telephone Encounter (Signed)
Left message for pt to call back  °

## 2016-05-01 NOTE — Telephone Encounter (Signed)
Pt notified and will pick up prescription  

## 2016-05-02 ENCOUNTER — Encounter: Payer: Self-pay | Admitting: Hematology and Oncology

## 2016-05-02 NOTE — Telephone Encounter (Signed)
Faxed

## 2016-05-17 ENCOUNTER — Ambulatory Visit (HOSPITAL_BASED_OUTPATIENT_CLINIC_OR_DEPARTMENT_OTHER): Payer: BLUE CROSS/BLUE SHIELD | Admitting: Hematology and Oncology

## 2016-05-17 ENCOUNTER — Encounter: Payer: Self-pay | Admitting: Hematology and Oncology

## 2016-05-17 ENCOUNTER — Telehealth: Payer: Self-pay | Admitting: Hematology and Oncology

## 2016-05-17 VITALS — BP 118/73 | HR 72 | Temp 98.2°F | Resp 18 | Ht 64.5 in | Wt 192.8 lb

## 2016-05-17 DIAGNOSIS — R718 Other abnormality of red blood cells: Secondary | ICD-10-CM | POA: Insufficient documentation

## 2016-05-17 NOTE — Assessment & Plan Note (Signed)
Microcytosis: Differential diagnosis is between iron deficiency anemia and beta thalassemia. I discussed at length the condition and the pathophysiology of beta thalassemia. Patient was previously informed that she may have sickle cell anemia in [place].  Plan: 1. CBC with differential 2. Hemoglobin electrophoresis 3. Iron studies with ferritin  Return to clinic in 2 weeks to discuss the results of these tests

## 2016-05-17 NOTE — Telephone Encounter (Signed)
appt made and avs printed °

## 2016-05-17 NOTE — Progress Notes (Signed)
last is seen for excitable I do not I guess I'm [place] NOTE  Patient Care Team: Robyn Haber, MD as PCP - General (Family Medicine)  CHIEF COMPLAINTS/PURPOSE OF CONSULTATION:  Microcytosis  HISTORY OF PRESENTING ILLNESS:  Emily [name] 60 y.o. female is here because of recent diagnosis of microcytosis. Patient had a primary care physician follow-up who performed blood work that revealed microcytosis without any significant anemia. With the concern that she may have beta thalassemia she was referred to [place]. Patient is here accompanied by her son. She is originally from [place]. She reports that as a child she was informed that she may have sickle cell anemia. She has never had any problems with anemia. She had some distant family members with anemia issues but her near family members have not had any problems. She does have intermittent leg pains which she attributes it to sickle cell.  I reviewed her records extensively and collaborated the history with the patient.  MEDICAL HISTORY:  Past Medical History  Diagnosis Date  . Hypertension   . Hyperlipidemia   . Back pain   . GERD (gastroesophageal reflux disease)   . Colon polyps   . Arthritis   . Abdominal pain, chronic, epigastric 11/02/2015    Started amitriptyline at hospitalization 04/13/2016 EGD x 2 negative Also w/ chronic chest wall and back pain Would try not to work up further   . Chronic high back pain 09/24/2015    SURGICAL HISTORY: Past Surgical History  Procedure Laterality Date  . Abdominal hysterectomy    . Colonoscopy    . Esophagogastroduodenoscopy    . Esophagogastroduodenoscopy [name] 04/13/2016    Procedure: ESOPHAGOGASTRODUODENOSCOPY (EGD);  Surgeon: Gatha Mayer, MD;  Location: [facility];  Service: Endoscopy;  Laterality: [name];    SOCIAL HISTORY: Social History   Social History  . Marital Status: Married    Spouse Name: [name]  . Number of Children: 3  .  Years of Education: [name]   Occupational History  . Not on file.   Social History Main Topics  . Smoking status: Never Smoker   . Smokeless tobacco: Never Used  . Alcohol Use: No  . Drug Use: No  . Sexual Activity: Not on file   Other Topics Concern  . Not on file   Social History Narrative    FAMILY HISTORY: Family History  Problem Relation Age of Onset  . Aneurysm Mother   . Diabetes Father   . Colon cancer Brother 27    ALLERGIES:  is allergic to aspirin and nsaids.  MEDICATIONS:  Current Outpatient Prescriptions  Medication Sig Dispense Refill  . ALPRAZolam (XANAX) 0.25 MG tablet TAKE 1 TABLET BY MOUTH THREE TIMES DAILY AS NEEDED FOR ANXIETY 30 tablet 0  . amitriptyline (ELAVIL) 25 MG tablet Take 1 tablet (25 mg total) by mouth at bedtime. 30 tablet 0  . esomeprazole (NEXIUM) 40 MG capsule Take 1 capsule (40 mg total) by mouth daily at 12 noon. 90 capsule 1  . metoprolol succinate (TOPROL-XL) 25 MG 24 hr tablet Take 1 tablet (25 mg total) by mouth daily. 90 tablet 3  . NIFEdipine (PROCARDIA-XL/ADALAT CC) 30 MG 24 hr tablet TAKE 1 TABLET (30 MG) BY MOUTH DAILY 90 tablet 3  . predniSONE (DELTASONE) 10 MG tablet Take 1 tablet (10 mg total) by mouth daily with breakfast. 10 tablet 0  . rosuvastatin (CRESTOR) 20 MG tablet TAKE 1 TABLET BY MOUTH EVERY DAY. 90 tablet 3  No current facility-administered medications for this visit.    REVIEW OF SYSTEMS:   Constitutional: Denies fevers, chills or abnormal night sweats Eyes: Denies blurriness of vision, double vision or watery eyes Ears, nose, mouth, throat, and face: Denies mucositis or sore throat Respiratory: Denies cough, dyspnea or wheezes Cardiovascular: Denies palpitation, chest discomfort or lower extremity swelling Gastrointestinal:  Denies nausea, heartburn or change in bowel habits Skin: Denies abnormal skin rashes Lymphatics: Denies new lymphadenopathy or easy bruising Neurological:Denies numbness, tingling or  new weaknesses Behavioral/Psych: Mood is stable, no new changes  Breast:  Denies any palpable lumps or discharge All other systems were reviewed with the patient and are negative.  PHYSICAL EXAMINATION: ECOG PERFORMANCE STATUS: 0 - Asymptomatic  Filed Vitals:   05/17/16 1545  BP: 118/73  Pulse: 72  Temp: 98.2 F (36.8 C)  Resp: 18   Filed Weights   05/17/16 1545  Weight: 192 lb 12.8 oz (87.454 kg)    GENERAL:alert, no distress and comfortable SKIN: skin color, texture, turgor are normal, no rashes or significant lesions EYES: normal, conjunctiva are pink and non-injected, sclera clear OROPHARYNX:no exudate, no erythema and lips, buccal mucosa, and tongue normal  NECK: supple, thyroid normal size, non-tender, without nodularity LYMPH:  no palpable lymphadenopathy in the cervical, axillary or inguinal LUNGS: clear to auscultation and percussion with normal breathing effort HEART: regular rate & rhythm and no murmurs and no lower extremity edema ABDOMEN:abdomen soft, non-tender and normal bowel sounds Musculoskeletal:no cyanosis of digits and no clubbing  PSYCH: alert & oriented x 3 with fluent speech NEURO: no focal motor/sensory deficits  LABORATORY DATA:  I have reviewed the data as listed Lab Results  Component Value Date   WBC 5.7 04/13/2016   HGB 12.1 04/13/2016   HCT 37.0 04/13/2016   MCV 76.0* 04/13/2016   PLT 251 04/13/2016   Lab Results  Component Value Date   NA 143 04/13/2016   K 3.8 04/13/2016   CL 109 04/13/2016   CO2 28 04/13/2016    ASSESSMENT AND PLAN:  Microcytic red blood cells Microcytosis: Differential diagnosis is between iron deficiency anemia and beta thalassemia. I discussed at length the condition and the pathophysiology of beta thalassemia. Patient was previously informed that she may have sickle cell anemia in [place].  Plan: 1. CBC with differential 2. Hemoglobin electrophoresis 3. Iron studies with  ferritin  Return to clinic in 2 weeks to discuss the results of these tests     All questions were answered. The patient knows to call the clinic with any problems, questions or concerns.    Rulon Eisenmenger, MD 05/17/2016

## 2016-05-18 ENCOUNTER — Other Ambulatory Visit (HOSPITAL_BASED_OUTPATIENT_CLINIC_OR_DEPARTMENT_OTHER): Payer: BLUE CROSS/BLUE SHIELD

## 2016-05-18 DIAGNOSIS — R718 Other abnormality of red blood cells: Secondary | ICD-10-CM

## 2016-05-18 LAB — CBC & DIFF AND RETIC
BASO%: 0.2 % (ref 0.0–2.0)
Basophils Absolute: 0 10*3/uL (ref 0.0–0.1)
EOS ABS: 0.2 10*3/uL (ref 0.0–0.5)
EOS%: 2.3 % (ref 0.0–7.0)
HCT: 37.6 % (ref 34.8–46.6)
HEMOGLOBIN: 12.5 g/dL (ref 11.6–15.9)
IMMATURE RETIC FRACT: 6.5 % (ref 1.60–10.00)
LYMPH%: 28.6 % (ref 14.0–49.7)
MCH: 24.9 pg — ABNORMAL LOW (ref 25.1–34.0)
MCHC: 33.2 g/dL (ref 31.5–36.0)
MCV: 74.9 fL — ABNORMAL LOW (ref 79.5–101.0)
MONO#: 0.8 10*3/uL (ref 0.1–0.9)
MONO%: 8.8 % (ref 0.0–14.0)
NEUT%: 60.1 % (ref 38.4–76.8)
NEUTROS ABS: 5.6 10*3/uL (ref 1.5–6.5)
Platelets: 271 10*3/uL (ref 145–400)
RBC: 5.02 10*6/uL (ref 3.70–5.45)
RDW: 15.1 % — AB (ref 11.2–14.5)
RETIC %: 1.14 % (ref 0.70–2.10)
Retic Ct Abs: 57.23 10*3/uL (ref 33.70–90.70)
WBC: 9.3 10*3/uL (ref 3.9–10.3)
lymph#: 2.7 10*3/uL (ref 0.9–3.3)

## 2016-05-22 LAB — HEMOGLOBINOPATHY EVALUATION
HEMOGLOBIN F QUANTITATION: 0 % (ref 0.0–2.0)
HGB A: 62.8 % — AB (ref 94.0–98.0)
HGB C: 0 %
HGB S: 33.2 % — AB
Hemoglobin A2 Quantitation: 4 % — ABNORMAL HIGH (ref 0.7–3.1)

## 2016-05-31 ENCOUNTER — Ambulatory Visit (HOSPITAL_BASED_OUTPATIENT_CLINIC_OR_DEPARTMENT_OTHER): Payer: BLUE CROSS/BLUE SHIELD | Admitting: Hematology and Oncology

## 2016-05-31 ENCOUNTER — Encounter: Payer: Self-pay | Admitting: Hematology and Oncology

## 2016-05-31 VITALS — BP 118/72 | HR 78 | Temp 98.6°F | Resp 18 | Ht 64.5 in | Wt 189.9 lb

## 2016-05-31 DIAGNOSIS — R718 Other abnormality of red blood cells: Secondary | ICD-10-CM | POA: Diagnosis not present

## 2016-05-31 NOTE — Progress Notes (Signed)
Patient Care Team: Robyn Haber, MD as PCP - General (Family Medicine)  DIAGNOSIS: Sickle beta thalassemia+  CHIEF COMPLIANT: Follow-up to discuss blood work  INTERVAL HISTORY: Emily [name] is a 60 year old lady with above-mentioned history of microcytosis who came to [place] for discussion regarding the different causes of microcytosis. We had done lab work including iron studies and hemoglobin electrophoresis along with a CBC. She is here today to discuss the results. She is accompanied by her son who is interpreter.  REVIEW OF SYSTEMS:   Constitutional: Denies fevers, chills or abnormal weight loss Eyes: Denies blurriness of vision Ears, nose, mouth, throat, and face: Denies mucositis or sore throat Respiratory: Denies cough, dyspnea or wheezes Cardiovascular: Denies palpitation, chest discomfort Gastrointestinal:  Denies nausea, heartburn or change in bowel habits Skin: Denies abnormal skin rashes Lymphatics: Denies new lymphadenopathy or easy bruising Neurological:Denies numbness, tingling or new weaknesses Behavioral/Psych: Mood is stable, no new changes  Extremities: No lower extremity edema  All other systems were reviewed with the patient and are negative.  I have reviewed the past medical history, past surgical history, social history and family history with the patient and they are unchanged from previous note.  ALLERGIES:  is allergic to aspirin and nsaids.  MEDICATIONS:  Current Outpatient Prescriptions  Medication Sig Dispense Refill  . ALPRAZolam (XANAX) 0.25 MG tablet TAKE 1 TABLET BY MOUTH THREE TIMES DAILY AS NEEDED FOR ANXIETY 30 tablet 0  . amitriptyline (ELAVIL) 25 MG tablet Take 1 tablet (25 mg total) by mouth at bedtime. 30 tablet 0  . esomeprazole (NEXIUM) 40 MG capsule Take 1 capsule (40 mg total) by mouth daily at 12 noon. 90 capsule 1  . metoprolol succinate (TOPROL-XL) 25 MG 24 hr tablet Take 1 tablet (25 mg total) by mouth daily. 90 tablet 3    . NIFEdipine (PROCARDIA-XL/ADALAT CC) 30 MG 24 hr tablet TAKE 1 TABLET (30 MG) BY MOUTH DAILY 90 tablet 3  . predniSONE (DELTASONE) 10 MG tablet Take 1 tablet (10 mg total) by mouth daily with breakfast. 10 tablet 0  . rosuvastatin (CRESTOR) 20 MG tablet TAKE 1 TABLET BY MOUTH EVERY DAY. 90 tablet 3   No current facility-administered medications for this visit.    PHYSICAL EXAMINATION: ECOG PERFORMANCE STATUS: 1 - Symptomatic but completely ambulatory  Filed Vitals:   05/31/16 1314  BP: 118/72  Pulse: 78  Temp: 98.6 F (37 C)  Resp: 18   Filed Weights   05/31/16 1314  Weight: 189 lb 14.4 oz (86.138 kg)    GENERAL:alert, no distress and comfortable SKIN: skin color, texture, turgor are normal, no rashes or significant lesions EYES: normal, Conjunctiva are pink and non-injected, sclera clear OROPHARYNX:no exudate, no erythema and lips, buccal mucosa, and tongue normal  NECK: supple, thyroid normal size, non-tender, without nodularity LYMPH:  no palpable lymphadenopathy in the cervical, axillary or inguinal LUNGS: clear to auscultation and percussion with normal breathing effort HEART: regular rate & rhythm and no murmurs and no lower extremity edema ABDOMEN:abdomen soft, non-tender and normal bowel sounds MUSCULOSKELETAL:no cyanosis of digits and no clubbing  NEURO: alert & oriented x 3 with fluent speech, no focal motor/sensory deficits EXTREMITIES: No lower extremity edema  LABORATORY DATA:  I have reviewed the data as listed   Chemistry      Component Value Date/Time   NA 143 04/13/2016 0455   K 3.8 04/13/2016 0455   CL 109 04/13/2016 0455   CO2 28 04/13/2016 0455   BUN 6 04/13/2016  0455   CREATININE 0.49 04/13/2016 0455   CREATININE 0.66 10/17/2015 1900      Component Value Date/Time   CALCIUM 9.1 04/13/2016 0455   ALKPHOS 116 04/11/2016 1851   AST 21 04/11/2016 1851   ALT 24 04/11/2016 1851   BILITOT 0.4 04/11/2016 1851       Lab Results  Component  Value Date   WBC 9.3 05/18/2016   HGB 12.5 05/18/2016   HCT 37.6 05/18/2016   MCV 74.9* 05/18/2016   PLT 271 05/18/2016   NEUTROABS 5.6 05/18/2016   ASSESSMENT & PLAN:  Microcytic red blood cells Microcytosis: Workup performed on 05/18/2016 1. Hemoglobin electrophoresis: Reveals sickle cell trait with hemoglobin S 33.2% with hemoglobin A2 at 4% 2. Hemoglobin 12.5, MCV 75, RDW 15, absolute reticulocyte count 57, reticulocyte percent 1.14, ferritin 79  I discussed with the patient that the hemoglobin electrophoresis suggests that she has sickle cell trait and beta thalassemia minor. There is no further testing required for any further evaluation. Since the patient is asymptomatic and does not have any significant anemia, no further workup or treatment is necessary.  Patient has been encouraged to discuss with her primary care physician to monitor her hemoglobin levels as well as her symptoms as sickle cell trait patients may also develop pain crises.   Return to clinic as needed. No orders of the defined types were placed in this encounter.   The patient has a good understanding of the overall plan. she agrees with it. she will call with any problems that may develop before the next visit here.   Rulon Eisenmenger, MD 05/31/2016

## 2016-05-31 NOTE — Assessment & Plan Note (Signed)
Microcytosis: Workup performed on 05/18/2016 1. 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  2. Hemoglobin 12.5, MCV 75, RDW 15, absolute reticulocyte count 57, reticulocyte percent 1.14, ferritin 79  I discussed with the patient that the hemoglobin electrophoresis suggests that she has sickle cell trait. The microcytosis could be part of the sickle cell clinic as well. There is no further testing required for any further evaluation.  Patient has been encouraged to discuss with her primary care physician to monitor her hemoglobin levels as well as her symptoms as sickle cell trait patients may also develop pain crises.

## 2016-06-25 ENCOUNTER — Telehealth: Payer: Self-pay | Admitting: Gastroenterology

## 2016-06-25 ENCOUNTER — Encounter: Payer: Self-pay | Admitting: Gastroenterology

## 2016-06-25 ENCOUNTER — Other Ambulatory Visit (INDEPENDENT_AMBULATORY_CARE_PROVIDER_SITE_OTHER): Payer: BLUE CROSS/BLUE SHIELD

## 2016-06-25 ENCOUNTER — Ambulatory Visit (INDEPENDENT_AMBULATORY_CARE_PROVIDER_SITE_OTHER): Payer: BLUE CROSS/BLUE SHIELD | Admitting: Gastroenterology

## 2016-06-25 ENCOUNTER — Ambulatory Visit: Payer: BLUE CROSS/BLUE SHIELD | Admitting: Gastroenterology

## 2016-06-25 VITALS — BP 100/58 | HR 76 | Ht 64.0 in | Wt 192.4 lb

## 2016-06-25 DIAGNOSIS — R1032 Left lower quadrant pain: Secondary | ICD-10-CM | POA: Diagnosis not present

## 2016-06-25 LAB — CBC WITH DIFFERENTIAL/PLATELET
BASOS ABS: 0 10*3/uL (ref 0.0–0.1)
Basophils Relative: 0.6 % (ref 0.0–3.0)
EOS ABS: 0.3 10*3/uL (ref 0.0–0.7)
EOS PCT: 3.8 % (ref 0.0–5.0)
HEMATOCRIT: 37 % (ref 36.0–46.0)
Hemoglobin: 12.2 g/dL (ref 12.0–15.0)
LYMPHS PCT: 32 % (ref 12.0–46.0)
Lymphs Abs: 2.6 10*3/uL (ref 0.7–4.0)
MCHC: 32.9 g/dL (ref 30.0–36.0)
MCV: 75.7 fl — ABNORMAL LOW (ref 78.0–100.0)
MONOS PCT: 9.6 % (ref 3.0–12.0)
Monocytes Absolute: 0.8 10*3/uL (ref 0.1–1.0)
NEUTROS ABS: 4.4 10*3/uL (ref 1.4–7.7)
Neutrophils Relative %: 54 % (ref 43.0–77.0)
PLATELETS: 277 10*3/uL (ref 150.0–400.0)
RBC: 4.89 Mil/uL (ref 3.87–5.11)
RDW: 14.9 % (ref 11.5–15.5)
WBC: 8.2 10*3/uL (ref 4.0–10.5)

## 2016-06-25 NOTE — Telephone Encounter (Signed)
Do not bill 

## 2016-06-25 NOTE — Patient Instructions (Addendum)
You will be set up for a CT scan of abdomen and pelvis with IV and oral contrast (for LLQ pains)  You have been scheduled for a CT scan of the abdomen and pelvis at [facility] ([address]---this is in the same building as Press [facility]).   You are scheduled on 06/27/16 at 230 pm. You should arrive 15 minutes prior to your appointment time for registration. Please follow the written instructions below on the day of your exam:  WARNING: IF YOU ARE ALLERGIC TO IODINE/X-RAY DYE, PLEASE NOTIFY RADIOLOGY IMMEDIATELY AT [phone]! YOU WILL BE GIVEN A 13 HOUR PREMEDICATION PREP.  1) Do not eat or drink anything after 1030 am (4 hours prior to your test) 2) You have been given 2 bottles of oral contrast to drink. The solution may taste better if refrigerated, but do NOT add ice or any other liquid to this solution. Shake well before drinking.    Drink 1 bottle of contrast @ 1230 pm (2 hours prior to your exam)  Drink 1 bottle of contrast @ 130 pm (1 hour prior to your exam)  You may take any medications as prescribed with a small amount of water except for the following: Metformin, Glucophage, Glucovance, Avandamet, Riomet, Fortamet, Actoplus Met, Janumet, Glumetza or Metaglip. The above medications must be held the day of the exam AND 48 hours after the exam.  The purpose of you drinking the oral contrast is to aid in the visualization of your intestinal tract. The contrast solution may cause some diarrhea. Before your exam is started, you will be given a small amount of fluid to drink. Depending on your individual set of symptoms, you may also receive an intravenous injection of x-ray contrast/dye. Plan on being at [facility] for 30 minutes or longer, depending on the type of exam you are having performed.  This test typically takes 30-45 minutes to complete.  If you have any questions regarding your exam or if you need to reschedule, you may call the CT department at  [phone] between the hours of 8:00 am and 5:00 pm, Monday-Friday.  ________________________________________________________________________  Dennis Bast will have labs checked today in the basement lab.  Please head down after you check out with the front desk  (cbc).

## 2016-06-25 NOTE — Progress Notes (Signed)
Review of pertinent gastrointestinal problems: 1. Intermittent upper abd pains. Likely functional. Has been going on for years.  EGD in 02/2015 by Dr. Ardis Hughs that showed a 3 cm hiatal hernia and some non-specific gastritis that was Hpylori negative on biopsy.  EGD 03/2016 Dr. Carlean Purl was normal.  [place] 2014 and 2016 were both normal. 2. Colonoscopy Dr. Paulita Fujita 02/2012 (as outpatient) found small polyp, was inflammatory, he recommended recall at 5 year interval due to "fair prep"  HPI: This is a    very pleasant 60 year old woman  Chief complaint is left lower quadrant abdominal pain  Was hemocult + 03/2016 during hosp admission:  EGD done, see above.  Spanish only, interpreter here.   She has full feeling, gassy, intermittent diarrhea, intermirrnt constipation.  She has pains in LLQ.   This started 3 days ago.    This is a new pain for her.  The pain is intermittent. It is a grabbing type pain.      Past Medical History  Diagnosis Date  . Hypertension   . Hyperlipidemia   . Back pain   . GERD (gastroesophageal reflux disease)   . Colon polyps   . Arthritis   . Abdominal pain, chronic, epigastric 11/02/2015    Started amitriptyline at hospitalization 04/13/2016 EGD x 2 negative Also w/ chronic chest wall and back pain Would try not to work up further   . Chronic high back pain 09/24/2015    Past Surgical History  Procedure Laterality Date  . Abdominal hysterectomy    . Colonoscopy    . Esophagogastroduodenoscopy    . Esophagogastroduodenoscopy [name] 04/13/2016    Procedure: ESOPHAGOGASTRODUODENOSCOPY (EGD);  Surgeon: Gatha Mayer, MD;  Location: [facility];  Service: Endoscopy;  Laterality: [name];    Current Outpatient Prescriptions  Medication Sig Dispense Refill  . ALPRAZolam (XANAX) 0.25 MG tablet TAKE 1 TABLET BY MOUTH THREE TIMES DAILY AS NEEDED FOR ANXIETY 30 tablet 0  . esomeprazole (NEXIUM) 40 MG capsule Take 1 capsule (40 mg total) by mouth daily at 12 noon. 90 capsule 1  .  metoprolol succinate (TOPROL-XL) 25 MG 24 hr tablet Take 1 tablet (25 mg total) by mouth daily. 90 tablet 3  . NIFEdipine (PROCARDIA-XL/ADALAT CC) 30 MG 24 hr tablet TAKE 1 TABLET (30 MG) BY MOUTH DAILY 90 tablet 3  . rosuvastatin (CRESTOR) 20 MG tablet TAKE 1 TABLET BY MOUTH EVERY DAY. 90 tablet 3   No current facility-administered medications for this visit.    Allergies as of 06/25/2016 - Review Complete 06/25/2016  Allergen Reaction Noted  . Aspirin Hives   . Nsaids Hives 07/15/2009    Family History  Problem Relation Age of Onset  . Aneurysm Mother   . Diabetes Father   . Colon cancer Brother 95  . Esophageal cancer Neg Hx   . Stomach cancer Neg Hx   . Pancreatic cancer Neg Hx   . Liver cancer Brother     mets from colon    Social History   Social History  . Marital Status: Married    Spouse Name: [name]  . Number of Children: 3  . Years of Education: [name]   Occupational History  . Not on file.   Social History Main Topics  . Smoking status: Never Smoker   . Smokeless tobacco: Never Used  . Alcohol Use: No  . Drug Use: No  . Sexual Activity: Not on file   Other Topics Concern  . Not on file   Social History  Narrative     Physical Exam: BP 100/58 mmHg  Pulse 76  Ht 5\' 4"  (1.626 m)  Wt 192 lb 6.4 oz (87.272 kg)  BMI 33.01 kg/m2 Constitutional: generally well-appearing Psychiatric: alert and oriented x3 Abdomen: soft, Mildly tender left lower quadrant, nondistended, no obvious ascites, no peritoneal signs, normal bowel sounds   Assessment and plan: 60 y.o. female with left lower quadrant pain, tenderness  She has mild tenderness in her left lower quadrant. She has been having pains are for the past 3 days. I recommended we proceed with workup including CBC and a CT scan abdomen and pelvis to start.   Owens Loffler, MD [facility] 06/25/2016, 2:39 PM

## 2016-06-27 ENCOUNTER — Ambulatory Visit (INDEPENDENT_AMBULATORY_CARE_PROVIDER_SITE_OTHER)
Admission: RE | Admit: 2016-06-27 | Discharge: 2016-06-27 | Disposition: A | Discharge status: Home / Self Care | Payer: BLUE CROSS/BLUE SHIELD | Source: Ambulatory Visit | Attending: Gastroenterology | Admitting: Gastroenterology

## 2016-06-27 DIAGNOSIS — R1032 Left lower quadrant pain: Secondary | ICD-10-CM | POA: Diagnosis not present

## 2016-06-27 MED ORDER — IOPAMIDOL (ISOVUE-300) INJECTION 61%
100.0000 mL | Freq: Once | INTRAVENOUS | Status: AC | PRN
  Administered 2016-06-27: 100 mL via INTRAVENOUS

## 2016-06-28 ENCOUNTER — Telehealth: Payer: Self-pay | Admitting: Gastroenterology

## 2016-06-28 NOTE — Telephone Encounter (Signed)
Can you let her know that the HIDA scan results are not available yet, thanks.  We will contact her as soon as available.

## 2016-07-04 ENCOUNTER — Telehealth: Payer: Self-pay | Admitting: Gastroenterology

## 2016-07-05 NOTE — Telephone Encounter (Signed)
Left message on machine to call back  

## 2016-07-05 NOTE — Telephone Encounter (Signed)
Pt notified of the CT results and has ROV scheduled

## 2016-08-07 ENCOUNTER — Emergency Department (HOSPITAL_COMMUNITY): Payer: BLUE CROSS/BLUE SHIELD

## 2016-08-07 ENCOUNTER — Emergency Department (HOSPITAL_COMMUNITY)
Admission: EM | Admit: 2016-08-07 | Discharge: 2016-08-07 | Disposition: A | Discharge status: Home / Self Care | Payer: BLUE CROSS/BLUE SHIELD | Attending: Emergency Medicine | Admitting: Emergency Medicine

## 2016-08-07 ENCOUNTER — Encounter (HOSPITAL_COMMUNITY): Payer: Self-pay

## 2016-08-07 DIAGNOSIS — R110 Nausea: Secondary | ICD-10-CM | POA: Diagnosis not present

## 2016-08-07 DIAGNOSIS — R1031 Right lower quadrant pain: Secondary | ICD-10-CM | POA: Diagnosis not present

## 2016-08-07 DIAGNOSIS — R1032 Left lower quadrant pain: Secondary | ICD-10-CM | POA: Insufficient documentation

## 2016-08-07 DIAGNOSIS — R300 Dysuria: Secondary | ICD-10-CM | POA: Insufficient documentation

## 2016-08-07 DIAGNOSIS — I10 Essential (primary) hypertension: Secondary | ICD-10-CM | POA: Diagnosis not present

## 2016-08-07 DIAGNOSIS — Z86010 Personal history of colonic polyps: Secondary | ICD-10-CM | POA: Diagnosis not present

## 2016-08-07 DIAGNOSIS — Z79899 Other long term (current) drug therapy: Secondary | ICD-10-CM | POA: Diagnosis not present

## 2016-08-07 LAB — COMPREHENSIVE METABOLIC PANEL
ALBUMIN: 4.5 g/dL (ref 3.5–5.0)
ALK PHOS: 108 U/L (ref 38–126)
ALT: 24 U/L (ref 14–54)
AST: 22 U/L (ref 15–41)
Anion gap: 6 (ref 5–15)
BUN: 18 mg/dL (ref 6–20)
CALCIUM: 9.4 mg/dL (ref 8.9–10.3)
CHLORIDE: 102 mmol/L (ref 101–111)
CO2: 28 mmol/L (ref 22–32)
CREATININE: 0.57 mg/dL (ref 0.44–1.00)
GFR calc non Af Amer: >60 mL/min (ref >60)
GLUCOSE: 91 mg/dL (ref 65–99)
Potassium: 4 mmol/L (ref 3.5–5.1)
SODIUM: 136 mmol/L (ref 135–145)
Total Bilirubin: 0.5 mg/dL (ref 0.3–1.2)
Total Protein: 8 g/dL (ref 6.5–8.1)

## 2016-08-07 LAB — URINALYSIS, ROUTINE W REFLEX MICROSCOPIC
Bilirubin Urine: NEGATIVE
GLUCOSE, UA: NEGATIVE mg/dL
HGB URINE DIPSTICK: NEGATIVE
Ketones, ur: NEGATIVE mg/dL
Leukocytes, UA: NEGATIVE
Nitrite: NEGATIVE
PROTEIN: NEGATIVE mg/dL
Specific Gravity, Urine: 1.007 (ref 1.005–1.030)
pH: 7 (ref 5.0–8.0)

## 2016-08-07 LAB — CBC
HCT: 38.5 % (ref 36.0–46.0)
Hemoglobin: 12.9 g/dL (ref 12.0–15.0)
MCH: 25.3 pg — AB (ref 26.0–34.0)
MCHC: 33.5 g/dL (ref 30.0–36.0)
MCV: 75.5 fL — AB (ref 78.0–100.0)
PLATELETS: 281 10*3/uL (ref 150–400)
RBC: 5.1 MIL/uL (ref 3.87–5.11)
RDW: 14.6 % (ref 11.5–15.5)
WBC: 7.2 10*3/uL (ref 4.0–10.5)

## 2016-08-07 LAB — LIPASE, BLOOD: LIPASE: 22 U/L (ref 11–51)

## 2016-08-07 MED ORDER — MORPHINE SULFATE (PF) 4 MG/ML IV SOLN
4.0000 mg | Freq: Once | INTRAVENOUS | Status: AC
  Administered 2016-08-07: 4 mg via INTRAVENOUS
  Filled 2016-08-07: qty 1

## 2016-08-07 MED ORDER — IOPAMIDOL (ISOVUE-300) INJECTION 61%
100.0000 mL | Freq: Once | INTRAVENOUS | Status: AC | PRN
  Administered 2016-08-07: 100 mL via INTRAVENOUS

## 2016-08-07 MED ORDER — DICYCLOMINE HCL 20 MG PO TABS
20.0000 mg | ORAL_TABLET | Freq: Two times a day (BID) | ORAL | 0 refills | Status: DC
Start: 2016-08-07 — End: 2017-01-16

## 2016-08-07 MED ORDER — ONDANSETRON HCL 4 MG PO TABS: 4.0000 mg | ORAL_TABLET | Freq: Four times a day (QID) | ORAL | 0 refills | Status: DC

## 2016-08-07 MED ORDER — ONDANSETRON HCL 4 MG/2ML IJ SOLN
4.0000 mg | Freq: Once | INTRAMUSCULAR | Status: AC
  Administered 2016-08-07: 4 mg via INTRAVENOUS
  Filled 2016-08-07: qty 2

## 2016-08-07 MED ORDER — ONDANSETRON 8 MG PO TBDP
8.0000 mg | ORAL_TABLET | Freq: Once | ORAL | Status: AC
  Administered 2016-08-07: 8 mg via ORAL
  Filled 2016-08-07: qty 1

## 2016-08-07 NOTE — Discharge Instructions (Signed)
Please take the Bentyl up to twice a day to help with abdominal spasms, make take Zofran as needed for nausea, continuing taking her Nexium. Follow up with your primary care doctor, and keep your GI appointment in October. If symptoms worsen pleaser return to the ED if you develop a fever or having increase vomiting.

## 2016-08-07 NOTE — ED Provider Notes (Signed)
[facility] DEPT Provider Note   CSN: [id number] Arrival date & time: 08/07/16  1034     History   Chief Complaint Chief Complaint  Patient presents with  . Abdominal Pain    HPI Emily [name] is a 60 y.o. female.  60 year old Hispanic female past medical history for GERD, hiatal hernia, HTN, chronic back pain presents with generalized abdominal pain that started at 4 AM this morning. Patient endorses generalized abdominal pain however she points to her lower abdomen when asked about pain. She describes the pain as sharp and burning that is constant. Nothing makes better or worse. She has not tried anything for the pain. Patient states the last 3 days she has had mild cramping abdominal pain approximately an hour after she eats. She also endorses dysuria 3 days. Denies any hematuria, urgency, frequency. Patient denies any recent new foods. Denies any NSAID use.  Denies any fevers, chills, headaches, belching, chest pain, shortness of breath, nausea, emesis, diarrhea, change in bowel habits, , hematochezia, melena, vaginal bleeding, or discharge.  Patient was admitted in March 2017 for epigastric pain and melena. She had endoscopy performed was unremarkable. Patient history of gastritis. She was placed on a PPI and told to follow up PCP.     The history is provided by the patient and a relative. The history is limited by a language barrier (Her son was at bedside that wanted to interpert. Patietn with broken [place].).  Abdominal Pain  Associated symptoms include nausea and dysuria. Pertinent negatives include fever, diarrhea, vomiting, constipation, frequency, hematuria, headaches and arthralgias.    Past Medical History:  Diagnosis Date  . Abdominal pain, chronic, epigastric 11/02/2015   Started amitriptyline at hospitalization 04/13/2016 EGD x 2 negative Also w/ chronic chest wall and back pain Would try not to work up further   . Arthritis   . Back pain   . Chronic  high back pain 09/24/2015  . Colon polyps   . GERD (gastroesophageal reflux disease)   . Hyperlipidemia   . Hypertension     Patient Active Problem List   Diagnosis Date Noted  . Microcytic red blood cells 05/17/2016  . GERD (gastroesophageal reflux disease) 04/12/2016  . Black stools 04/11/2016  . RUQ abdominal pain 11/02/2015  . Abdominal pain, chronic, epigastric 11/02/2015  . Abdominal bloating 11/02/2015  . Chronic high back pain 09/24/2015  . Anxiety 09/08/2014  . HTN (hypertension) 10/03/2012  . Hyperlipidemia 10/03/2012  . DYSPEPSIA&OTHER [facility] DISORDERS FUNCTION STOMACH 07/20/2009    Past Surgical History:  Procedure Laterality Date  . ABDOMINAL HYSTERECTOMY    . COLONOSCOPY    . ESOPHAGOGASTRODUODENOSCOPY    . ESOPHAGOGASTRODUODENOSCOPY [name] 04/13/2016   Procedure: ESOPHAGOGASTRODUODENOSCOPY (EGD);  Surgeon: Gatha Mayer, MD;  Location: [facility];  Service: Endoscopy;  Laterality: [name];    OB History    No data available       Home Medications    Prior to Admission medications   Medication Sig Start Date End Date Taking? Authorizing Provider  ALPRAZolam Duanne Moron) 0.25 MG tablet TAKE 1 TABLET BY MOUTH THREE TIMES DAILY AS NEEDED FOR ANXIETY 05/02/16   Robyn Haber, MD  esomeprazole (NEXIUM) 40 MG capsule Take 1 capsule (40 mg total) by mouth daily at 12 noon. 10/17/15   Robyn Haber, MD  metoprolol succinate (TOPROL-XL) 25 MG 24 hr tablet Take 1 tablet (25 mg total) by mouth daily. 04/25/16   Robyn Haber, MD  NIFEdipine (PROCARDIA-XL/ADALAT CC) 30 MG 24 hr tablet TAKE 1 TABLET (  30 MG) BY MOUTH DAILY 04/25/16   Robyn Haber, MD  rosuvastatin (CRESTOR) 20 MG tablet TAKE 1 TABLET BY MOUTH EVERY DAY. 04/25/16   Robyn Haber, MD    Family History Family History  Problem Relation Age of Onset  . Aneurysm Mother   . Diabetes Father   . Colon cancer Brother 21  . Liver cancer Brother     mets from colon  . Esophageal cancer Neg Hx   . Stomach cancer  Neg Hx   . Pancreatic cancer Neg Hx     Social History Social History  Substance Use Topics  . Smoking status: Never Smoker  . Smokeless tobacco: Never Used  . Alcohol use No     Allergies   Aspirin and Nsaids   Review of Systems Review of Systems  Constitutional: Negative for appetite change, chills, fever and unexpected weight change.  HENT: Negative for congestion, ear pain and sore throat.   Eyes: Negative for pain and visual disturbance.  Respiratory: Negative for cough and shortness of breath.   Cardiovascular: Negative for chest pain, palpitations and leg swelling.  Gastrointestinal: Positive for abdominal pain and nausea. Negative for blood in stool, constipation, diarrhea and vomiting.  Genitourinary: Positive for dysuria. Negative for flank pain, frequency, hematuria and urgency.  Musculoskeletal: Negative for arthralgias and back pain.  Skin: Negative for color change and rash.  Neurological: Negative for dizziness, syncope, weakness, light-headedness and headaches.  All other systems reviewed and are negative.    Physical Exam Updated Vital Signs BP 124/83 (BP Location: Right Arm)   Pulse 87   Temp 98.2 F (36.8 C) (Oral)   Resp 18   Ht 5\' 4"  (1.626 m)   Wt 87.1 kg   SpO2 93%   BMI 32.96 kg/m   Physical Exam  Constitutional: She appears well-developed and well-nourished. No distress.  Patient appears uncomfortable on bed.  HENT:  Head: Normocephalic and atraumatic.  Mouth/Throat: Oropharynx is clear and moist.  Eyes: Conjunctivae are normal. Pupils are equal, round, and reactive to light.  Neck: Normal range of motion. Neck supple. No thyromegaly present.  Cardiovascular: Normal rate, regular rhythm, normal heart sounds and intact distal pulses.   Pulmonary/Chest: Effort normal and breath sounds normal.  Abdominal: Soft. Bowel sounds are normal. She exhibits no distension. There is tenderness in the right lower quadrant, suprapubic area and left  lower quadrant. There is no rigidity, no rebound, no guarding, no CVA tenderness, no tenderness at McBurney's point and negative Murphy's sign.  Lymphadenopathy:    She has no cervical adenopathy.  Neurological: She is alert.  Skin: Skin is warm and dry. Capillary refill takes less than 2 seconds.     ED Treatments / Results  Labs (all labs ordered are listed, but only abnormal results are displayed) Labs Reviewed  CBC - Abnormal; Notable for the following:       Result Value   MCV 75.5 (*)    MCH 25.3 (*)    All other components within normal limits  LIPASE, BLOOD  COMPREHENSIVE METABOLIC PANEL  URINALYSIS, ROUTINE W REFLEX MICROSCOPIC (NOT AT [facility])    EKG  EKG Interpretation None       Radiology Ct Abdomen Pelvis W Contrast  Result Date: 08/07/2016 CLINICAL DATA:  Right lower quadrant pain today EXAM: CT ABDOMEN AND PELVIS WITH CONTRAST TECHNIQUE: Multidetector CT imaging of the abdomen and pelvis was performed using the standard protocol following bolus administration of intravenous contrast. CONTRAST:  151mL ISOVUE-300 IOPAMIDOL (ISOVUE-300)  INJECTION 61% COMPARISON:  CT 06/27/2016 FINDINGS: Lower chest:  Lung bases clear without infiltrate or effusion. Hepatobiliary: 5 mm cyst in the central liver is unchanged. No liver mass. Normal liver size and contour. Gallbladder and bile ducts normal. Pancreas: Negative Spleen: Negative Adrenals/Urinary Tract: Negative for urinary tract calculi. No renal obstruction or mass. Urinary bladder normal. Adrenal glands normal. Stomach/Bowel: Hiatal hernia. Normal stomach and duodenum. Negative for bowel obstruction. Normal appendix. No inflammation or thickening in the right lower quadrant to explain the patient's pain. Mild diverticulosis in the sigmoid colon. Vascular/Lymphatic: Aorta and IVC normal.  No adenopathy Reproductive: Hysterectomy changes.  Negative for adnexal mass. Other: No free fluid. Small umbilical hernia containing fat,  unchanged. Musculoskeletal: Mild lumbar scoliosis and degenerative change. No acute skeletal abnormality. IMPRESSION: No cause for the patient's right lower quadrant pain. Normal appendix. No acute abnormality. Electronically Signed   By: Franchot Gallo M.D.   On: 08/07/2016 14:16    Procedures Procedures (including critical care time)  Medications Ordered in ED Medications - No data to display   Initial Impression / Assessment and Plan / ED Course  I have reviewed the triage vital signs and the nursing notes.  Pertinent labs & imaging results that were available during my care of the patient were reviewed by me and considered in my medical decision making (see chart for details).  Clinical Course  Patient presented today with abdominal cramping that started this morning and dysuria. Labs were unremarkable. No leukocytosis. CT unremarkable except for existing umbilical hernia that is not worsening. Low suspicion for appendicitis. Lipase normal. Low suspicion for cholecystitis or pancreatitis. UA normal. No signs of urinary infection. Denies diarrhea or constipation. Low suspicion for infectious enteritis or diverticulitis. Patient with history of enteritis. She has appointment with GI doctor in October as a routine exam. Patient given prescription for bentyl to help with cramping pain and Zofran for nausea. Patient to follow up with PCP if symptoms worse. Patient states she wants an ultrasound for her gallbladder during discharge. Discussed with patient thatt her labs and Ct would have shown any abnormalities with gallbladder. Encouraged patient to follow up with PCP to schedule outpatient abd [place]. She states that she is still nauseous. Gave another dose of ODT zofran. Patient verbalized understanding via interpreter. Discussed plan of care with Dr. Ephraim Hamburger. Patient stable and in NAD. Ready for discharge home.    Final Clinical Impressions(s) / ED Diagnoses   Final diagnoses:  Right lower  quadrant abdominal pain  Nausea    New Prescriptions New Prescriptions   DICYCLOMINE (BENTYL) 20 MG TABLET    Take 1 tablet (20 mg total) by mouth 2 (two) times daily.   ONDANSETRON (ZOFRAN) 4 MG TABLET    Take 1 tablet (4 mg total) by mouth every 6 (six) hours.     Doristine Devoid, PA-C 08/07/16 2100    Gareth Morgan, MD 08/08/16 [phone]

## 2016-08-07 NOTE — ED Triage Notes (Signed)
Pt presents with c/o generalized abdominal pain that started at 4 this morning. Pt reports the pain feels like burning and throbbing. Pt denies any V/D and some nausea at 9:30 that has now resolved.

## 2016-08-07 NOTE — ED Provider Notes (Signed)
Pt seen and examined by me as well. Pt with diffuse abdominal pain for 3 days, urinary symptoms. Labs all unremarkable, UA negative. Pt with RLQ tenderness on palpation. CT abd/pelvis obtained, and is negative. Will place on bentyl and follow up with pcp.   Jeannett Senior, PA-C 08/07/16 [place], MD 08/08/16 [phone]

## 2016-08-07 NOTE — ED Notes (Signed)
Pt asked to speak to PA in regards to d/c. Interpreter paged. Pt is requesting [place] because her pain has returned and her daughter suggested it to avoid returning to ED for abd pain.

## 2016-09-04 ENCOUNTER — Ambulatory Visit: Payer: BLUE CROSS/BLUE SHIELD | Admitting: Gastroenterology

## 2016-09-10 ENCOUNTER — Ambulatory Visit: Payer: BLUE CROSS/BLUE SHIELD | Admitting: Gastroenterology

## 2016-11-06 ENCOUNTER — Ambulatory Visit (INDEPENDENT_AMBULATORY_CARE_PROVIDER_SITE_OTHER): Payer: BLUE CROSS/BLUE SHIELD | Admitting: Family Medicine

## 2016-11-06 VITALS — BP 122/70 | HR 78 | Temp 97.3°F | Ht 64.0 in | Wt 191.0 lb

## 2016-11-06 DIAGNOSIS — F418 Other specified anxiety disorders: Secondary | ICD-10-CM

## 2016-11-06 DIAGNOSIS — R1084 Generalized abdominal pain: Secondary | ICD-10-CM

## 2016-11-06 DIAGNOSIS — R51 Headache: Secondary | ICD-10-CM

## 2016-11-06 DIAGNOSIS — R519 Headache, unspecified: Secondary | ICD-10-CM

## 2016-11-06 DIAGNOSIS — R110 Nausea: Secondary | ICD-10-CM

## 2016-11-06 DIAGNOSIS — R34 Anuria and oliguria: Secondary | ICD-10-CM | POA: Diagnosis not present

## 2016-11-06 DIAGNOSIS — B351 Tinea unguium: Secondary | ICD-10-CM

## 2016-11-06 DIAGNOSIS — Z603 Acculturation difficulty: Secondary | ICD-10-CM

## 2016-11-06 DIAGNOSIS — Z789 Other specified health status: Secondary | ICD-10-CM

## 2016-11-06 MED ORDER — EFINACONAZOLE 10 % EX SOLN: 1.0000 "application " | Freq: Every day | CUTANEOUS | 3 refills | Status: DC

## 2016-11-06 MED ORDER — ALPRAZOLAM 0.25 MG PO TABS: 0.2500 mg | ORAL_TABLET | Freq: Three times a day (TID) | ORAL | 0 refills | Status: DC | PRN

## 2016-11-06 NOTE — Progress Notes (Addendum)
By signing my name below, I, Emily [name], attest that this documentation has been prepared under the direction and in the presence of Emily Ray, MD.  Electronically Signed: Verlee [name], Medical Scribe. 11/06/16. 7:04 PM.  Subjective:    Patient ID: Emily [name], female    DOB: 10-13-1956, 60 y.o.   MRN: [id number]  HPI Chief Complaint  Patient presents with  . RIGTH GREAT TOE FUNGUS  . Medication Refill    Emily [name]    HPI Comments: Emily [name] is a 60 y.o. female who presents to the [facility] for medication refill as well as right great toe fungus. [name] native language is Spanish so stratus interpreter was used; id Emily [name].  HTN: Takes toprol-xl 25mg , nifedipine 30mg .  Initially requested refills, but has refills left  Lab Results  Component Value Date   CREATININE 0.57 08/07/2016   HLD: Takes crestor 20 mg. Recent lft's ok.  Lab Results  Component Value Date   CHOL 166 05/22/2015   HDL 49 05/22/2015   LDLCALC 94 05/22/2015   LDLDIRECT 174 (H) 10/02/2012   TRIG 117 05/22/2015   CHOLHDL 3.4 05/22/2015   Lab Results  Component Value Date   ALT 24 08/07/2016   AST 22 08/07/2016   ALKPHOS 108 08/07/2016   BILITOT 0.5 08/07/2016   Anxiety: Has been treated with xanax in the past. Last Rx for #30 for 0.25mg  on May 10th. Takes xanax PRN weekly for sleep.  Toenails: Reports her feet began to itch first, then for the past 2 months her toenails began to crack and turn yellow.  Tried otc treatment (topical) that did not work.   Back Pain: At the end of the visit patient asked about her abdominal pain. [name] rheumatologist gave her morphine for her back pain and since she's been taking it for 15 days she's been experiencing HA, and intermittent weakness in her legs all week. 2 episodes of weakness yesterday, one episode of weakness today, not persistent weakness. Denies any slurred speech or other focal  weakness. Denies history of stroke. Today since noon (for the past 7 hours) she's been feeling nausea, and upper abdominal pain. Pt has only urinated once today this morning. Normal BM today. Denies PMHX of stroke. Denies emesis, fever, weakness in her arms, slurred speech, bloody stool, constipation. Headache has been persistent for 1 week without relief.  Patient Active Problem List   Diagnosis Date Noted  . Microcytic red blood cells 05/17/2016  . GERD (gastroesophageal reflux disease) 04/12/2016  . Black stools 04/11/2016  . RUQ abdominal pain 11/02/2015  . Abdominal pain, chronic, epigastric 11/02/2015  . Abdominal bloating 11/02/2015  . Chronic high back pain 09/24/2015  . Anxiety 09/08/2014  . HTN (hypertension) 10/03/2012  . Hyperlipidemia 10/03/2012  . DYSPEPSIA&OTHER [facility] DISORDERS FUNCTION STOMACH 07/20/2009   Past Medical History:  Diagnosis Date  . Abdominal pain, chronic, epigastric 11/02/2015   Started amitriptyline at hospitalization 04/13/2016 EGD x 2 negative Also w/ chronic chest wall and back pain Would try not to work up further   . Arthritis   . Back pain   . Chronic high back pain 09/24/2015  . Colon polyps   . GERD (gastroesophageal reflux disease)   . Hyperlipidemia   . Hypertension    Past Surgical History:  Procedure Laterality Date  . ABDOMINAL HYSTERECTOMY    . COLONOSCOPY    . ESOPHAGOGASTRODUODENOSCOPY    . ESOPHAGOGASTRODUODENOSCOPY [name] 04/13/2016   Procedure: ESOPHAGOGASTRODUODENOSCOPY (EGD);  Surgeon:  Gatha Mayer, MD;  Location: [facility];  Service: Endoscopy;  Laterality: [name];   Allergies  Allergen Reactions  . Aspirin Hives  . Nsaids Hives   Prior to Admission medications   Medication Sig Start Date End Date Taking? Authorizing Provider  ALPRAZolam (XANAX) 0.25 MG tablet TAKE 1 TABLET BY MOUTH THREE TIMES DAILY AS NEEDED FOR ANXIETY 05/02/16  Yes Robyn Haber, MD  dicyclomine (BENTYL) 20 MG tablet Take 1 tablet (20 mg total) by mouth  2 (two) times daily. 08/07/16  Yes Doristine Devoid, PA-C  esomeprazole (NEXIUM) 40 MG capsule Take 1 capsule (40 mg total) by mouth daily at 12 noon. 10/17/15  Yes Robyn Haber, MD  metoprolol succinate (TOPROL-XL) 25 MG 24 hr tablet Take 1 tablet (25 mg total) by mouth daily. 04/25/16  Yes Robyn Haber, MD  NIFEdipine (PROCARDIA-XL/ADALAT CC) 30 MG 24 hr tablet TAKE 1 TABLET (30 MG) BY MOUTH DAILY 04/25/16  Yes Robyn Haber, MD  rosuvastatin (CRESTOR) 20 MG tablet TAKE 1 TABLET BY MOUTH EVERY DAY. 04/25/16  Yes Robyn Haber, MD   Social History   Social History  . Marital status: Married    Spouse name: [name]  . Number of children: 3  . Years of education: [name]   Occupational History  . Not on file.   Social History Main Topics  . Smoking status: Never Smoker  . Smokeless tobacco: Never Used  . Alcohol use No  . Drug use: No  . Sexual activity: Not on file   Other Topics Concern  . Not on file   Social History Narrative  . No narrative on file   Review of Systems  Constitutional: Negative for fever.  Gastrointestinal: Positive for nausea. Negative for blood in stool, constipation and vomiting.  Skin:       Positive for discolored toenails  Neurological: Positive for weakness and headaches. Negative for speech difficulty.  Psychiatric/Behavioral: Positive for sleep disturbance (controlled with medication).    Objective:  Physical Exam  Constitutional: She appears well-developed and well-nourished. No distress.  HENT:  Head: Normocephalic and atraumatic.  Eyes: Conjunctivae are normal.  Neck: Neck supple.  Cardiovascular: Normal rate.   Pulmonary/Chest: Effort normal.  Abdominal: Normal appearance and bowel sounds are normal. There is tenderness in the right upper quadrant, epigastric area and left upper quadrant. There is no rebound, no CVA tenderness and negative Murphy's sign.  Neurological: She is alert.  Equal strength LE's with supine exam.  No face drrop,  equal UE strength.  Nonfocal.   Skin: Skin is warm and dry.  Thickened discolored toenails on the right and 5th right Left thickened discolored toenails as well as great toenail with some dryness of the feet bilaterally No interdigital rash or discharge  Psychiatric: She has a normal mood and affect. Her behavior is normal.  Nursing note and vitals reviewed.  BP 122/70 (BP Location: Right Arm, Patient Position: Sitting, Cuff Size: Small)   Pulse 78   Temp 97.3 F (36.3 C) (Oral)   Ht 5\' 4"  (1.626 m)   Wt 191 lb (86.6 kg)   SpO2 97%   BMI 32.79 kg/m  Assessment & Plan:    TATYIANA EATON is a 60 y.o. female Generalized abdominal pain, Nausea without vomiting,  decreased urine output  - noted abd pain at end of visit, currently on morphine by report. nausea and decreased UOP (uop x 1 in am only)  - ER eval for further testing for acute onset abd pain,  with nausea on pain med form her back specialist.by report. +/- IV fluid if needed as decr uop since this am.   Nonintractable headache, unspecified chronicity pattern, unspecified headache type  - persistent HA for 1 week, with reported episodes of LE weakness. No appreciable weakness on exam, but intermittent sx's.  Further eval through ER planned.   Situational anxiety - Plan: ALPRAZolam (XANAX) 0.25 MG tablet  - episodic use of xanax. Overall stable with intermittent use. Refilled same dose.   Onychomycosis  - options discussed, chose [place]. Consider lamisil if cost prohibitive with LFT monitoring.   Language barrier  -some spanish spoken and video interpreter used.    Meds ordered this encounter  Medications  . ALPRAZolam (XANAX) 0.25 MG tablet    Sig: Take 1 tablet (0.25 mg total) by mouth 3 (three) times daily as needed. for anxiety    Dispense:  30 tablet    Refill:  0  . Efinaconazole 10 % SOLN    Sig: Apply 1 application topically daily.    Dispense:  8 mL    Refill:  3   Patient Instructions   Va a  cuarto de emergencia hablar su dolor de cabeza y simptomas de menos fuerza en piernas, su dolor de [place], y solamente una orina hoy.   voy a hablar mas de su presion, colesterol, y otro medicinas en proximo 3 semanas.   Infeccin por hongos en las uas (Fungal Nail Infection) La infeccin por hongos en las uas es una infeccin por hongos frecuente en las uas de los pies o de las manos. Este trastorno [employer] uas de los pies con ms frecuencia que las uas de las manos. Ms de Ardelia Mems ua puede infectarse. Esta afeccin puede transmitirse de [place] persona a otra (es  contagiosa). CAUSAS La causa de esta afeccin es un hongo. Existen distintos tipos de hongos que pueden causar la infeccin. Estos hongos son ms frecuentes en las zonas hmedas y clidas. Si las manos o los pies entran en contacto con los hongos, se pueden introducir en una ruptura de las uas de las manos o de los pies y Immunologist infeccin. [place] hacer que usted sea propenso a sufrir esta afeccin:  Ser varn.  Tener diabetes.  Ser Ardelia Mems persona de edad avanzada.  Convivir con alguien que tiene hongos.  Caminar descalzo en zonas donde proliferan hongos, como duchas o vestuarios.  Tener mala circulacin.  Usar zapatos y calcetines que [employer].  Tener pie de atleta.  Tener una ua lastimada o antecedentes recientes de una ciruga de uas.  Tener psoriasis.  Debilitamiento del sistema de defensa del cuerpo (sistema inmunitario). SNTOMAS Los sntomas de esta afeccin incluyen lo siguiente:  Etta Grandchild plida sobre la ua.  Engrosamiento de la ua.  Una ua que se torna amarilla o [place].  [place] uas rugosos o quebradizos.  Una ua que se cae.  Una ua que se ha desprendido del lecho ungueal. DIAGNSTICO Esta afeccin se diagnostica mediante un examen fsico. El mdico podr tomar una muestra de la ua para examinarla y Hydrographic surveyor si  tiene hongos. TRATAMIENTO Las infecciones leves no necesitan tratamiento. Si tiene [employer] uas, el tratamiento puede incluir lo siguiente:  Medicamentos antimicticos por va oral. Deber tomar los medicamentos durante algunas semanas o meses y no ver los resultados hasta despus de un largo [place]. Estos medicamentos pueden tener efectos secundarios. Consulte al [employer]  problemas a los que debe estar atento.  Cremas y esmaltes para uas antimicticos. Se pueden usar junto con los medicamentos antimicticos que se administran por va oral.  Tratamiento lser de las uas.  Ciruga para extirpar la ua. Esto puede ser [employer] casos ms graves de infecciones. El tratamiento es muy [place] y la infeccin Psychologist, sport and exercise. INSTRUCCIONES PARA EL CUIDADO EN EL HOGAR Medicamentos   Tome o aplquese los medicamentos de venta libre y TEFL teacher se lo haya indicado el mdico.  Consulte al mdico sobre el uso de pomadas mentoladas para las uas de [place]. Estilo de vida   No comparta elementos personales como toallas o cortauas.  Crtese las uas con frecuencia.  Lvese y [place] y los pies todos [place].  Use calcetines absorbentes y cmbiese los calcetines con frecuencia.  Use un tipo de calzado que permita que el aire [place], como sandalias o zapatillas de lona. Deseche los zapatos viejos.  Use guantes de goma si est trabajando con sus manos en lugares mojados.  No camine descalzo en duchas o vestuarios.  No concurra a un saln de cosmtica de uas si no usan instrumentos limpios.  No use uas artificiales. Instrucciones generales   Concurra a todas las visitas de control como se lo haya indicado el mdico. Esto es importante.  Aplquese polvo antimictico en los pies y en los zapatos. SOLICITE ATENCIN MDICA SI: La infeccin no mejora o si empeora despus de varios meses. Esta informacin no tiene Hydrologist el consejo del mdico. Asegrese de hacerle al mdico cualquier pregunta que tenga. Document Released: 09/19/2005 Document Revised: 04/02/2016 Document Reviewed: 06/13/2015 Elsevier Interactive Patient Education  2017 [employer].     IF you received an x-[name] today, you will receive an invoice from [facility]. Please contact [facility] at [phone] with questions or concerns regarding your invoice.   IF you received labwork today, you will receive an invoice from [employer]. Please contact [facility] at [phone] with questions or concerns regarding your invoice.   Our billing staff will not be able to assist you with questions regarding bills from these companies.  You will be contacted with the lab results as soon as they are available. The fastest way to get your results is to activate your My Chart account. Instructions are located on the last page of this paperwork. If you have not heard from [place] regarding the results in 2 weeks, please contact this office.         I personally performed the services described in this documentation, which was scribed in my presence. The recorded information has been reviewed and considered, and addended by me as needed.   Signed,   Emily Ray, MD [facility].  11/06/16 8:10 PM

## 2016-11-06 NOTE — Patient Instructions (Addendum)
Va a cuarto de Control and instrumentation engineer su dolor de cabeza y simptomas de menos fuerza en piernas, su dolor de [place], y solamente una orina hoy.   voy a hablar mas de su presion, colesterol, y otro medicinas en proximo 3 semanas.   Infeccin por hongos en las uas (Fungal Nail Infection) La infeccin por hongos en las uas es una infeccin por hongos frecuente en las uas de los pies o de las manos. Este trastorno [employer] uas de los pies con ms frecuencia que las uas de las manos. Ms de Ardelia Mems ua puede infectarse. Esta afeccin puede transmitirse de [place] persona a otra (es  contagiosa). CAUSAS La causa de esta afeccin es un hongo. Existen distintos tipos de hongos que pueden causar la infeccin. Estos hongos son ms frecuentes en las zonas hmedas y clidas. Si las manos o los pies entran en contacto con los hongos, se pueden introducir en una ruptura de las uas de las manos o de los pies y Immunologist infeccin. [place] hacer que usted sea propenso a sufrir esta afeccin:  Ser varn.  Tener diabetes.  Ser Ardelia Mems persona de edad avanzada.  Convivir con alguien que tiene hongos.  Caminar descalzo en zonas donde proliferan hongos, como duchas o vestuarios.  Tener mala circulacin.  Usar zapatos y calcetines que [employer].  Tener pie de atleta.  Tener una ua lastimada o antecedentes recientes de una ciruga de uas.  Tener psoriasis.  Debilitamiento del sistema de defensa del cuerpo (sistema inmunitario). SNTOMAS Los sntomas de esta afeccin incluyen lo siguiente:  Etta Grandchild plida sobre la ua.  Engrosamiento de la ua.  Una ua que se torna amarilla o [place].  [place] uas rugosos o quebradizos.  Una ua que se cae.  Una ua que se ha desprendido del lecho ungueal. DIAGNSTICO Esta afeccin se diagnostica mediante un examen fsico. El mdico podr tomar una muestra de la ua para examinarla y Hydrographic surveyor si  tiene hongos. TRATAMIENTO Las infecciones leves no necesitan tratamiento. Si tiene [employer] uas, el tratamiento puede incluir lo siguiente:  Medicamentos antimicticos por va oral. Deber tomar los medicamentos durante algunas semanas o meses y no ver los resultados hasta despus de un largo [place]. Estos medicamentos pueden tener efectos secundarios. Consulte al [employer] a los que debe estar atento.  Cremas y esmaltes para uas antimicticos. Se pueden usar junto con los medicamentos antimicticos que se administran por va oral.  Tratamiento lser de las uas.  Ciruga para extirpar la ua. Esto puede ser [employer] casos ms graves de infecciones. El tratamiento es muy [place] y la infeccin Psychologist, sport and exercise. INSTRUCCIONES PARA EL CUIDADO EN EL HOGAR Medicamentos   Tome o aplquese los medicamentos de venta libre y TEFL teacher se lo haya indicado el mdico.  Consulte al mdico sobre el uso de pomadas mentoladas para las uas de [place]. Estilo de vida   No comparta elementos personales como toallas o cortauas.  Crtese las uas con frecuencia.  Lvese y [place] y los pies todos [place].  Use calcetines absorbentes y cmbiese los calcetines con frecuencia.  Use un tipo de calzado que permita que el aire [place], como sandalias o zapatillas de lona. Deseche los zapatos viejos.  Use guantes de goma si est trabajando con sus manos en lugares mojados.  No camine descalzo en duchas o vestuarios.  No concurra a un saln  de cosmtica de uas si no usan instrumentos limpios.  No use uas artificiales. Instrucciones generales   Concurra a todas las visitas de control como se lo haya indicado el mdico. Esto es importante.  Aplquese polvo antimictico en los pies y en los zapatos. SOLICITE ATENCIN MDICA SI: La infeccin no mejora o si empeora despus de varios meses. Esta informacin no tiene Hydrologist el consejo del mdico. Asegrese de hacerle al mdico cualquier pregunta que tenga. Document Released: 09/19/2005 Document Revised: 04/02/2016 Document Reviewed: 06/13/2015 Elsevier Interactive Patient Education  2017 [employer].     IF you received an x-ray today, you will receive an invoice from [facility]. Please contact [facility] at [phone] with questions or concerns regarding your invoice.   IF you received labwork today, you will receive an invoice from [employer]. Please contact [facility] at [phone] with questions or concerns regarding your invoice.   Our billing staff will not be able to assist you with questions regarding bills from these companies.  You will be contacted with the lab results as soon as they are available. The fastest way to get your results is to activate your My Chart account. Instructions are located on the last page of this paperwork. If you have not heard from [place] regarding the results in 2 weeks, please contact this office.

## 2016-11-23 ENCOUNTER — Ambulatory Visit: Payer: BLUE CROSS/BLUE SHIELD | Admitting: Gastroenterology

## 2016-12-15 ENCOUNTER — Ambulatory Visit: Payer: BLUE CROSS/BLUE SHIELD

## 2016-12-21 ENCOUNTER — Telehealth: Payer: Self-pay | Admitting: Gastroenterology

## 2016-12-21 ENCOUNTER — Ambulatory Visit: Payer: BLUE CROSS/BLUE SHIELD | Admitting: Gastroenterology

## 2016-12-25 ENCOUNTER — Ambulatory Visit: Payer: BLUE CROSS/BLUE SHIELD | Admitting: Gastroenterology

## 2016-12-29 ENCOUNTER — Ambulatory Visit (INDEPENDENT_AMBULATORY_CARE_PROVIDER_SITE_OTHER): Payer: BLUE CROSS/BLUE SHIELD | Admitting: Family Medicine

## 2016-12-29 ENCOUNTER — Ambulatory Visit (INDEPENDENT_AMBULATORY_CARE_PROVIDER_SITE_OTHER): Payer: BLUE CROSS/BLUE SHIELD

## 2016-12-29 VITALS — BP 115/76 | HR 68 | Temp 97.6°F | Resp 18 | Ht 64.0 in | Wt 187.8 lb

## 2016-12-29 DIAGNOSIS — R059 Cough, unspecified: Secondary | ICD-10-CM

## 2016-12-29 DIAGNOSIS — R05 Cough: Secondary | ICD-10-CM

## 2016-12-29 DIAGNOSIS — Z789 Other specified health status: Secondary | ICD-10-CM | POA: Diagnosis not present

## 2016-12-29 DIAGNOSIS — J22 Unspecified acute lower respiratory infection: Secondary | ICD-10-CM

## 2016-12-29 DIAGNOSIS — R509 Fever, unspecified: Secondary | ICD-10-CM | POA: Diagnosis not present

## 2016-12-29 LAB — POCT CBC
GRANULOCYTE PERCENT: 57.1 % (ref 37–80)
HEMATOCRIT: 39.3 % (ref 37.7–47.9)
Hemoglobin: 13.3 g/dL (ref 12.2–16.2)
Lymph, poc: 2.7 (ref 0.6–3.4)
MCH: 26 pg — AB (ref 27–31.2)
MCHC: 33.9 g/dL (ref 31.8–35.4)
MCV: 76.7 fL — AB (ref 80–97)
MID (CBC): 0.6 (ref 0–0.9)
MPV: 7.6 fL (ref 0–99.8)
POC GRANULOCYTE: 4.4 (ref 2–6.9)
POC LYMPH %: 34.5 % (ref 10–50)
POC MID %: 8.4 %M (ref 0–12)
Platelet Count, POC: 274 10*3/uL (ref 142–424)
RBC: 5.13 M/uL (ref 4.04–5.48)
RDW, POC: 14.1 %
WBC: 7.7 10*3/uL (ref 4.6–10.2)

## 2016-12-29 MED ORDER — AZITHROMYCIN 250 MG PO TABS: ORAL_TABLET | ORAL | 0 refills | Status: DC

## 2016-12-29 MED ORDER — BENZONATATE 100 MG PO CAPS: 100.0000 mg | ORAL_CAPSULE | Freq: Three times a day (TID) | ORAL | 0 refills | Status: DC | PRN

## 2016-12-29 NOTE — Patient Instructions (Addendum)
tessalon para tos si necesario. Azithromycin es un antibiotico. Regrese si empeorse.    Bronquitis aguda (Acute Bronchitis) La bronquitis es una inflamacin de las vas respiratorias que se extienden desde la trquea [facility] pulmones (bronquios). La inflamacin produce la formacin de mucosidad. Esto produce tos, que es el sntoma ms frecuente de la bronquitis.  Cuando la bronquitis es [place], generalmente comienza de [place] sbita y desaparece luego de un par de semanas. El hbito de fumar, las alergias y el asma pueden empeorar la bronquitis. Los episodios repetidos de bronquitis pueden causar ms problemas pulmonares.  CAUSAS La causa ms frecuente de bronquitis aguda es el mismo virus que produce el resfro. El virus puede propagarse de Ardelia Mems persona a la otra (contagioso) a travs de la tos y los estornudos, y al tocar objetos contaminados. [place].  Cristy Hilts.  Tos con mucosidad.  Dolores [employer] cuerpo.  Congestin en el pecho.  Escalofros.  Falta de aire.  Dolor de Investment banker, operational. DIAGNSTICO  La bronquitis aguda en general se diagnostica con un examen fsico. El mdico tambin le har preguntas sobre su historia clnica. En algunos casos se indican otros estudios, como radiografas, para Clinical research associate.  TRATAMIENTO  La bronquitis aguda generalmente desaparece en un par de semanas. Con frecuencia, no es Systems analyst. Los medicamentos se indican para aliviar la fiebre o la tos. Generalmente, no es necesario el uso de antibiticos, pero pueden recetarse en ciertas ocasiones. En algunos casos, se recomienda el uso de un inhalador para mejorar la falta de aire y Aeronautical engineer tos. Un vaporizador de aire fro podr ayudarlo a [employer] bronquiales y Armed forces technical officer su eliminacin.  INSTRUCCIONES PARA EL CUIDADO EN EL HOGAR  Descanse lo suficiente.  Beba lquidos en abundancia para mantener la orina de color claro o amarillo  plido (excepto que padezca una enfermedad que requiera la restriccin de lquidos). El aumento de lquidos puede ayudar a que las secreciones respiratorias (esputo) sean menos espesas y a reducir la congestin del pecho, y Mining engineer deshidratacin.  Tome los medicamentos solamente como se lo haya indicado el mdico.  Si le recetaron antibiticos, asegrese de terminarlos, incluso si comienza a sentirse mejor.  Evite fumar o aspirar el humo de otros fumadores. La exposicin al humo del cigarrillo o a irritantes qumicos har que la bronquitis empeore. Si fuma, considere el uso de goma de Higher education careers adviser o la aplicacin de parches en la piel que contengan nicotina para Public house manager los sntomas de abstinencia. Si deja de fumar, sus pulmones se curarn ms rpido.  Reduzca la probabilidad de otro episodio de bronquitis aguda lavando sus manos con frecuencia, evitando a las personas que tengan sntomas y tratando de no tocarse las manos con la boca, la nariz o los ojos.  Concurra a todas las visitas de control como se lo haya indicado el mdico. SOLICITE ATENCIN MDICA SI: Los sntomas no mejoran despus de una semana de [place].  SOLICITE ATENCIN MDICA DE INMEDIATO SI:  Comienza a tener fiebre o escalofros cada vez ms intensos.  Siente dolor en el pecho.  Le falta el aire de manera preocupante.  La flema tiene [place].  Se deshidrata.  Se desmaya o siente que va a desmayarse de forma repetida.  Tiene vmitos que se repiten.  Tiene un dolor de cabeza intenso. ASEGRESE DE QUE:   Comprende estas instrucciones.  Controlar su afeccin.  Recibir ayuda de inmediato si no mejora o si empeora. Esta informacin no tiene  como fin reemplazar el consejo del mdico. Asegrese de hacerle al mdico cualquier pregunta que tenga. Document Released: 12/10/2005 Document Revised: 12/31/2014 Elsevier Interactive Patient Education  2017 [place] en los adultos (Cough, Adult) La tos es un  reflejo que limpia la garganta y las vas respiratorias, y ayuda a la curacin y Health visitor de los pulmones. Es normal toser de Engineer, civil (consulting), pero cuando esta se presenta con otros sntomas o dura mucho tiempo puede ser el signo de una enfermedad que [place]. La tos puede durar solo 2 o 3semanas (aguda) o ms de 8semanas (crnica). CAUSAS Comnmente, las causas de la tos son las siguientes:  Advice worker sustancias que [employer]  Una infeccin respiratoria viral o bacteriana.  Alergias.  Asma.  Goteo posnasal.  Fumar.  El retroceso de cido estomacal hacia el esfago (reflujo gastroesofgico).  Algunos medicamentos.  Los problemas pulmonares crnicos, entre ellos, la enfermedad pulmonar obstructiva crnica (EPOC) (o, en contadas ocasiones, el cncer de pulmn).  Otras afecciones, como la insuficiencia cardaca. INSTRUCCIONES PARA EL CUIDADO EN EL HOGAR Est atento a cualquier cambio en los sntomas. Tome estas medidas para Public house manager las molestias:  Tome los medicamentos solamente como se lo haya indicado el mdico.  Si le recetaron un antibitico, tmelo como se lo haya indicado el mdico. No deje de tomar los antibiticos aunque comience a sentirse mejor.  Hable con el mdico antes de tomar un antitusivo.  Beba suficiente lquido para Consulting civil engineer orina clara o de color amarillo plido.  Si el aire est seco, use un vaporizador o un humidificador con vapor fro en su habitacin o en su casa para ayudar a aflojar las secreciones.  Evite todas las cosas que le producen tos en el trabajo o en su casa.  Si la tos aumenta durante la noche, intente dormir semisentado.  Evite el humo del cigarrillo. Si fuma, deje de hacerlo. Si necesita ayuda para dejar de fumar, consulte al mdico.  Evite la cafena.  Evite el alcohol.  Descanse todo lo que sea necesario. SOLICITE ATENCIN MDICA SI:  Aparecen nuevos sntomas.  Expectora pus al toser.  La tos no  mejora despus de 2 o 3semanas, o empeora.  No puede controlar la tos con antitusivos y no puede dormir bien.  Tiene un dolor que se intensifica o que no puede Research scientist (life sciences).  Tiene fiebre.  Baja de peso sin causa aparente.  Tiene transpiracin nocturna. SOLICITE ATENCIN MDICA DE INMEDIATO SI:  Tose y escupe sangre.  Tiene dificultad para respirar.  Los latidos cardacos son muy rpidos. Esta informacin no tiene Marine scientist el consejo del mdico. Asegrese de hacerle al mdico cualquier pregunta que tenga. Document Released: 07/18/2011 Document Revised: 08/31/2015 Document Reviewed: 02/16/2015 Elsevier Interactive Patient Education  2017 [employer].    IF you received an x-ray today, you will receive an invoice from [facility] Radiology. Please contact [facility] Radiology at [phone] with questions or concerns regarding your invoice.   IF you received labwork today, you will receive an invoice from [facility]. Please contact [facility] at [phone] with questions or concerns regarding your invoice.   Our billing staff will not be able to assist you with questions regarding bills from these companies.  You will be contacted with the lab results as soon as they are available. The fastest way to get your results is to activate your My Chart account. Instructions are located on the last page of this paperwork. If you have not  heard from [place] regarding the results in 2 weeks, please contact this office.

## 2016-12-29 NOTE — Progress Notes (Signed)
Subjective:  By signing my name below, I, [name], attest that this documentation has been prepared under the direction and in the presence of [name], MD.  Electronically Signed: [name], ED Scribe. 12/29/2016. 1:41 PM.   Patient ID: Emily [name], female    DOB: 18-Jun-1956, 61 y.o.   MRN: [id number]  HPI Chief Complaint  Patient presents with  . Sore Throat    x3 weeks; also caused her to feel fatigue  . Cough    patient states often her head hurts; coughing up green sputum  . Fever    did not take temp but knows she had a fever    HPI Comments: Emily [name] is a 61 y.o. female who presents to the [facility] complaining of sore throat and cough. Pt reports started with subjective fever and chills. States symptoms had started to improved, but has gotten worse. She reports associated fatigue, yellow green nasal discharge, stomach fatigue and also notes feeling as if her lungs hurt, but no back pain. She has tried tylenol and vitamin C.      Patient Active Problem List   Diagnosis Date Noted  . Microcytic red blood cells 05/17/2016  . GERD (gastroesophageal reflux disease) 04/12/2016  . Black stools 04/11/2016  . RUQ abdominal pain 11/02/2015  . Abdominal pain, chronic, epigastric 11/02/2015  . Abdominal bloating 11/02/2015  . Chronic high back pain 09/24/2015  . Anxiety 09/08/2014  . HTN (hypertension) 10/03/2012  . Hyperlipidemia 10/03/2012  . DYSPEPSIA&OTHER [facility] DISORDERS FUNCTION STOMACH 07/20/2009   Past Medical History:  Diagnosis Date  . Abdominal pain, chronic, epigastric 11/02/2015   Started amitriptyline at hospitalization 04/13/2016 EGD x 2 negative Also w/ chronic chest wall and back pain Would try not to work up further   . Arthritis   . Back pain   . Chronic high back pain 09/24/2015  . Colon polyps   . GERD (gastroesophageal reflux disease)   . Hyperlipidemia   . Hypertension    Past Surgical History:    Procedure Laterality Date  . ABDOMINAL HYSTERECTOMY    . COLONOSCOPY    . ESOPHAGOGASTRODUODENOSCOPY    . ESOPHAGOGASTRODUODENOSCOPY [name] 04/13/2016   Procedure: ESOPHAGOGASTRODUODENOSCOPY (EGD);  Surgeon: Gatha Mayer, MD;  Location: [facility];  Service: Endoscopy;  Laterality: [name];   Allergies  Allergen Reactions  . Aspirin Hives  . Nsaids Hives   Prior to Admission medications   Medication Sig Start Date End Date Taking? Authorizing Provider  ALPRAZolam (XANAX) 0.25 MG tablet Take 1 tablet (0.25 mg total) by mouth 3 (three) times daily as needed. for anxiety 11/06/16  Yes Wendie Agreste, MD  esomeprazole (NEXIUM) 40 MG capsule Take 1 capsule (40 mg total) by mouth daily at 12 noon. 10/17/15  Yes Robyn Haber, MD  metoprolol succinate (TOPROL-XL) 25 MG 24 hr tablet Take 1 tablet (25 mg total) by mouth daily. 04/25/16  Yes Robyn Haber, MD  NIFEdipine (PROCARDIA-XL/ADALAT CC) 30 MG 24 hr tablet TAKE 1 TABLET (30 MG) BY MOUTH DAILY 04/25/16  Yes Robyn Haber, MD  rosuvastatin (CRESTOR) 20 MG tablet TAKE 1 TABLET BY MOUTH EVERY DAY. 04/25/16  Yes Robyn Haber, MD  dicyclomine (BENTYL) 20 MG tablet Take 1 tablet (20 mg total) by mouth 2 (two) times daily. Patient not taking: Reported on 12/29/2016 08/07/16   Doristine Devoid, PA-C  Efinaconazole 10 % SOLN Apply 1 application topically daily. Patient not taking: Reported on 12/29/2016 11/06/16   Dellis Filbert  Valora Piccolo, MD   Social History   Social History  . Marital status: Married    Spouse name: [name]  . Number of children: 3  . Years of education: [name]   Occupational History  . Not on file.   Social History Main Topics  . Smoking status: Never Smoker  . Smokeless tobacco: Never Used  . Alcohol use No  . Drug use: No  . Sexual activity: Not on file   Other Topics Concern  . Not on file   Social History Narrative  . No narrative on file   Review of Systems  Constitutional: Positive for chills, fatigue and fever.   HENT: Positive for congestion and sore throat.   Respiratory: Positive for cough.     Objective:   Physical Exam  Constitutional: She is oriented to person, place, and time. She appears well-developed and well-nourished. No distress.  HENT:  Head: Normocephalic and atraumatic.  Nose: Right sinus exhibits frontal sinus tenderness. Right sinus exhibits no maxillary sinus tenderness. Left sinus exhibits frontal sinus tenderness ( left greater than right). Left sinus exhibits no maxillary sinus tenderness.  Mouth/Throat: No oropharyngeal exudate or posterior oropharyngeal erythema.  Visualized TM on the left appears normal but there is cerumen.  Eyes: Conjunctivae and EOM are normal.  Neck: Neck supple.  Cardiovascular: Normal rate.   Pulmonary/Chest: Effort normal.  Abdominal:  Upper abdomen discomfort, appears to be along upper wall  Musculoskeletal: Normal range of motion.  Neurological: She is alert and oriented to person, place, and time.  Skin: Skin is warm and dry.  Psychiatric: She has a normal mood and affect. Her behavior is normal.  Nursing note and vitals reviewed.  Vitals:   12/29/16 1321  BP: 115/76  Pulse: 68  Resp: 18  Temp: 97.6 F (36.4 C)  TempSrc: Oral  SpO2: 95%  Weight: 187 lb 12.8 oz (85.2 kg)  Height: 5\' 4"  (1.626 m)    Results for orders placed or performed in visit on 12/29/16  POCT CBC  Result Value Ref Range   WBC 7.7 4.6 - 10.2 K/uL   Lymph, poc 2.7 0.6 - 3.4   POC LYMPH PERCENT 34.5 10 - 50 %L   MID (cbc) 0.6 0 - 0.9   POC MID % 8.4 0 - 12 %M   POC Granulocyte 4.4 2 - 6.9   Granulocyte percent 57.1 37 - 80 %G   RBC 5.13 4.04 - 5.48 M/uL   Hemoglobin 13.3 12.2 - 16.2 g/dL   HCT, POC 39.3 37.7 - 47.9 %   MCV 76.7 (A) 80 - 97 fL   MCH, POC 26.0 (A) 27 - 31.2 pg   MCHC 33.9 31.8 - 35.4 g/dL   RDW, POC 14.1 %   Platelet Count, POC 274 142 - 424 K/uL   MPV 7.6 0 - 99.8 fL   Dg Chest 2 View  Result Date: 12/29/2016 CLINICAL DATA:  Cough and  sore throat.  Fever. EXAM: CHEST  2 VIEW COMPARISON:  Chest radiograph August 11, 2014 and chest CT August 11, 2014 FINDINGS: There is no edema or consolidation. Heart size and pulmonary vascularity are normal. No adenopathy. There is atherosclerotic calcification in the aorta. There is degenerative change in the thoracic spine. IMPRESSION: No edema or consolidation.  There is aortic atherosclerosis. Electronically Signed   By: Lowella Grip III M.D.   On: 12/29/2016 14:21    Assessment & Plan:    IVELIZ KNOUSE is a 61 y.o. female  LRTI (lower respiratory tract infection) - Plan: azithromycin (ZITHROMAX) 250 MG tablet, benzonatate (TESSALON) 100 MG capsule  Cough - Plan: POCT CBC, DG Chest 2 View, azithromycin (ZITHROMAX) 250 MG tablet, benzonatate (TESSALON) 100 MG capsule  Fever chills - Plan: POCT CBC, DG Chest 2 View  Language barrier  Possible viral illness initially, but with persistent symptoms and slight worsening, early lower respiratory tract infection/bronchitis with less likely pneumonia possible. Reassuring chest x-ray and CBC.   -Azithromycin/Z-Pak prescribed with Tessalon Perles as needed for cough. Symptomatic care and RTC precautions given. Spanish spoken with understanding expressed.  Meds ordered this encounter  Medications  . azithromycin (ZITHROMAX) 250 MG tablet    Sig: Take 2 pills by mouth on day 1, then 1 pill by mouth per day on days 2 through 5.    Dispense:  6 tablet    Refill:  0    Label in spanish  . benzonatate (TESSALON) 100 MG capsule    Sig: Take 1 capsule (100 mg total) by mouth 3 (three) times daily as needed for cough.    Dispense:  20 capsule    Refill:  0    Label in spanish   Patient Instructions    tessalon para tos si necesario. Azithromycin es un antibiotico. Regrese si empeorse.    Bronquitis aguda (Acute Bronchitis) La bronquitis es una inflamacin de las vas respiratorias que se extienden desde la trquea [facility]  pulmones (bronquios). La inflamacin produce la formacin de mucosidad. Esto produce tos, que es el sntoma ms frecuente de la bronquitis.  Cuando la bronquitis es [place], generalmente comienza de [place] sbita y desaparece luego de un par de semanas. El hbito de fumar, las alergias y el asma pueden empeorar la bronquitis. Los episodios repetidos de bronquitis pueden causar ms problemas pulmonares.  CAUSAS La causa ms frecuente de bronquitis aguda es el mismo virus que produce el resfro. El virus puede propagarse de Ardelia Mems persona a la otra (contagioso) a travs de la tos y los estornudos, y al tocar objetos contaminados. [place].  Cristy Hilts.  Tos con mucosidad.  Dolores [employer] cuerpo.  Congestin en el pecho.  Escalofros.  Falta de aire.  Dolor de Investment banker, operational. DIAGNSTICO  La bronquitis aguda en general se diagnostica con un examen fsico. El mdico tambin le har preguntas sobre su historia clnica. En algunos casos se indican otros estudios, como radiografas, para Clinical research associate.  TRATAMIENTO  La bronquitis aguda generalmente desaparece en un par de semanas. Con frecuencia, no es Systems analyst. Los medicamentos se indican para aliviar la fiebre o la tos. Generalmente, no es necesario el uso de antibiticos, pero pueden recetarse en ciertas ocasiones. En algunos casos, se recomienda el uso de un inhalador para mejorar la falta de aire y Aeronautical engineer tos. Un vaporizador de aire fro podr ayudarlo a [employer] bronquiales y Armed forces technical officer su eliminacin.  INSTRUCCIONES PARA EL CUIDADO EN EL HOGAR  Descanse lo suficiente.  Beba lquidos en abundancia para mantener la orina de color claro o amarillo plido (excepto que padezca una enfermedad que requiera la restriccin de lquidos). El aumento de lquidos puede ayudar a que las secreciones respiratorias (esputo) sean menos espesas y a reducir la congestin del pecho, y Mining engineer  deshidratacin.  Tome los medicamentos solamente como se lo haya indicado el mdico.  Si le recetaron antibiticos, asegrese de terminarlos, incluso si comienza a sentirse mejor.  Evite fumar o aspirar el humo de  otros fumadores. La exposicin al humo del cigarrillo o a irritantes qumicos har que la bronquitis empeore. Si fuma, considere el uso de goma de Higher education careers adviser o la aplicacin de parches en la piel que contengan nicotina para Public house manager los sntomas de abstinencia. Si deja de fumar, sus pulmones se curarn ms rpido.  Reduzca la probabilidad de otro episodio de bronquitis aguda lavando sus manos con frecuencia, evitando a las personas que tengan sntomas y tratando de no tocarse las manos con la boca, la nariz o los ojos.  Concurra a todas las visitas de control como se lo haya indicado el mdico. SOLICITE ATENCIN MDICA SI: Los sntomas no mejoran despus de una semana de [place].  SOLICITE ATENCIN MDICA DE INMEDIATO SI:  Comienza a tener fiebre o escalofros cada vez ms intensos.  Siente dolor en el pecho.  Le falta el aire de manera preocupante.  La flema tiene [place].  Se deshidrata.  Se desmaya o siente que va a desmayarse de forma repetida.  Tiene vmitos que se repiten.  Tiene un dolor de cabeza intenso. ASEGRESE DE QUE:   Comprende estas instrucciones.  Controlar su afeccin.  Recibir ayuda de inmediato si no mejora o si empeora. Esta informacin no tiene Marine scientist el consejo del mdico. Asegrese de hacerle al mdico cualquier pregunta que tenga. Document Released: 12/10/2005 Document Revised: 12/31/2014 Elsevier Interactive Patient Education  2017 [place] en los adultos (Cough, Adult) La tos es un reflejo que limpia la garganta y las vas respiratorias, y ayuda a la curacin y Health visitor de los pulmones. Es normal toser de Engineer, civil (consulting), pero cuando esta se presenta con otros sntomas o dura mucho tiempo puede ser el signo  de una enfermedad que [place]. La tos puede durar solo 2 o 3semanas (aguda) o ms de 8semanas (crnica). CAUSAS Comnmente, las causas de la tos son las siguientes:  Advice worker sustancias que [employer]  Una infeccin respiratoria viral o bacteriana.  Alergias.  Asma.  Goteo posnasal.  Fumar.  El retroceso de cido estomacal hacia el esfago (reflujo gastroesofgico).  Algunos medicamentos.  Los problemas pulmonares crnicos, entre ellos, la enfermedad pulmonar obstructiva crnica (EPOC) (o, en contadas ocasiones, el cncer de pulmn).  Otras afecciones, como la insuficiencia cardaca. INSTRUCCIONES PARA EL CUIDADO EN EL HOGAR Est atento a cualquier cambio en los sntomas. Tome estas medidas para Public house manager las molestias:  Tome los medicamentos solamente como se lo haya indicado el mdico.  Si le recetaron un antibitico, tmelo como se lo haya indicado el mdico. No deje de tomar los antibiticos aunque comience a sentirse mejor.  Hable con el mdico antes de tomar un antitusivo.  Beba suficiente lquido para Consulting civil engineer orina clara o de color amarillo plido.  Si el aire est seco, use un vaporizador o un humidificador con vapor fro en su habitacin o en su casa para ayudar a aflojar las secreciones.  Evite todas las cosas que le producen tos en el trabajo o en su casa.  Si la tos aumenta durante la noche, intente dormir semisentado.  Evite el humo del cigarrillo. Si fuma, deje de hacerlo. Si necesita ayuda para dejar de fumar, consulte al mdico.  Evite la cafena.  Evite el alcohol.  Descanse todo lo que sea necesario. SOLICITE ATENCIN MDICA SI:  Aparecen nuevos sntomas.  Expectora pus al toser.  La tos no mejora despus de 2 o 3semanas, o empeora.  No puede controlar la tos con antitusivos y  no puede dormir bien.  Tiene un dolor que se intensifica o que no puede Research scientist (life sciences).  Tiene fiebre.  Baja de peso sin  causa aparente.  Tiene transpiracin nocturna. SOLICITE ATENCIN MDICA DE INMEDIATO SI:  Tose y escupe sangre.  Tiene dificultad para respirar.  Los latidos cardacos son muy rpidos. Esta informacin no tiene Marine scientist el consejo del mdico. Asegrese de hacerle al mdico cualquier pregunta que tenga. Document Released: 07/18/2011 Document Revised: 08/31/2015 Document Reviewed: 02/16/2015 Elsevier Interactive Patient Education  2017 [employer].    IF you received an x-ray today, you will receive an invoice from [facility] Radiology. Please contact [facility] Radiology at [phone] with questions or concerns regarding your invoice.   IF you received labwork today, you will receive an invoice from [facility]. Please contact [facility] at [phone] with questions or concerns regarding your invoice.   Our billing staff will not be able to assist you with questions regarding bills from these companies.  You will be contacted with the lab results as soon as they are available. The fastest way to get your results is to activate your My Chart account. Instructions are located on the last page of this paperwork. If you have not heard from [place] regarding the results in 2 weeks, please contact this office.        I personally performed the services described in this documentation, which was scribed in my presence. The recorded information has been reviewed and considered, and addended by me as needed.   Signed,   [name], MD [facility].  12/30/16 3:04 PM

## 2017-01-05 ENCOUNTER — Telehealth: Payer: Self-pay

## 2017-01-05 NOTE — Telephone Encounter (Signed)
Pt would like brand name Crestor instead of the generic. States that the pharmacy will only fill the generic and she would like this changed please.  Thank you!

## 2017-01-08 NOTE — Telephone Encounter (Signed)
Ok to change to brand only.

## 2017-01-08 NOTE — Telephone Encounter (Signed)
Has she had issues with generic? Brand only may be more costly.  I am ok with switch if needed.

## 2017-01-09 MED ORDER — ROSUVASTATIN CALCIUM 20 MG PO TABS: ORAL_TABLET | ORAL | 0 refills | Status: DC

## 2017-01-16 ENCOUNTER — Ambulatory Visit (INDEPENDENT_AMBULATORY_CARE_PROVIDER_SITE_OTHER): Payer: BLUE CROSS/BLUE SHIELD

## 2017-01-16 ENCOUNTER — Ambulatory Visit (INDEPENDENT_AMBULATORY_CARE_PROVIDER_SITE_OTHER): Payer: BLUE CROSS/BLUE SHIELD | Admitting: Family Medicine

## 2017-01-16 VITALS — BP 122/70 | HR 70 | Temp 97.9°F | Ht 64.0 in | Wt 190.6 lb

## 2017-01-16 DIAGNOSIS — R0989 Other specified symptoms and signs involving the circulatory and respiratory systems: Secondary | ICD-10-CM | POA: Diagnosis not present

## 2017-01-16 DIAGNOSIS — R5383 Other fatigue: Secondary | ICD-10-CM | POA: Diagnosis not present

## 2017-01-16 DIAGNOSIS — R110 Nausea: Secondary | ICD-10-CM

## 2017-01-16 DIAGNOSIS — L299 Pruritus, unspecified: Secondary | ICD-10-CM | POA: Diagnosis not present

## 2017-01-16 DIAGNOSIS — E785 Hyperlipidemia, unspecified: Secondary | ICD-10-CM

## 2017-01-16 DIAGNOSIS — R42 Dizziness and giddiness: Secondary | ICD-10-CM | POA: Diagnosis not present

## 2017-01-16 DIAGNOSIS — R1013 Epigastric pain: Secondary | ICD-10-CM

## 2017-01-16 LAB — POCT URINALYSIS DIP (MANUAL ENTRY)
BILIRUBIN UA: NEGATIVE
BILIRUBIN UA: NEGATIVE
Glucose, UA: NEGATIVE
NITRITE UA: NEGATIVE
PH UA: 7
PROTEIN UA: NEGATIVE
RBC UA: NEGATIVE
Spec Grav, UA: 1.02
Urobilinogen, UA: 0.2

## 2017-01-16 LAB — POCT CBC
GRANULOCYTE PERCENT: 51.9 % (ref 37–80)
HEMATOCRIT: 35 % — AB (ref 37.7–47.9)
HEMOGLOBIN: 11.9 g/dL — AB (ref 12.2–16.2)
Lymph, poc: 2.7 (ref 0.6–3.4)
MCH: 25.9 pg — AB (ref 27–31.2)
MCHC: 34.1 g/dL (ref 31.8–35.4)
MCV: 76.1 fL — AB (ref 80–97)
MID (cbc): 0.6 (ref 0–0.9)
MPV: 7.6 fL (ref 0–99.8)
POC GRANULOCYTE: 3.5 (ref 2–6.9)
POC LYMPH PERCENT: 39.4 %L (ref 10–50)
POC MID %: 8.7 % (ref 0–12)
Platelet Count, POC: 216 10*3/uL (ref 142–424)
RBC: 4.59 M/uL (ref 4.04–5.48)
RDW, POC: 14.7 %
WBC: 6.8 10*3/uL (ref 4.6–10.2)

## 2017-01-16 LAB — POC MICROSCOPIC URINALYSIS (UMFC): Mucus: ABSENT

## 2017-01-16 MED ORDER — PRAVASTATIN SODIUM 20 MG PO TABS: 20.0000 mg | ORAL_TABLET | Freq: Every day | ORAL | 1 refills | Status: DC

## 2017-01-16 MED ORDER — ROSUVASTATIN CALCIUM 20 MG PO TABS: 20.0000 mg | ORAL_TABLET | Freq: Every day | ORAL | 1 refills | Status: DC

## 2017-01-16 NOTE — Progress Notes (Signed)
Subjective:  By signing my name below, I, [name], attest that this documentation has been prepared under the direction and in the presence of [name], MD.  Electronically Signed: [name], ED Scribe. 01/16/2017. 5:16 PM.   Patient ID: Emily [name], female    DOB: 07/10/1956, 62 y.o.   MRN: [id number]  HPI Chief Complaint  Patient presents with  . Headache    X 3 weeks with bodyaches  . Abdominal Pain    X 3 weeks with Fatigue    HPI Comments: Emily [name] is a 61 y.o. female who presents to the Primary Care at [facility] complaining of headache and abdominal pain. She speaks only [place] (is from [place]) and is here with her bilingual husband  Pt was seen 2 months ago by Dr. Carlota Raspberry for HA with occasional leg weakness and abdominal pain possibly from morphine from back. She was advised to go to the ED.  Pt reports ongoing HA, abdominal pain and fatigue for the past few months, but states symptoms have change. In the past few weeks, she's had worsening fatigue upon exertion, intermittent dizziness, weakness, feeling as if she is going to faint, leg cramps and weakness as well as skin itchiness, but has not noticed a rash. She reports dizziness when lying down and sometime in the morning with standing and while at work. She reports low potassium levels in the past and would like to have this checked.   Pt has been on crestor for a while. She reports intolerance to Lipitor; states it turned her finger nails yellow.  She was tolerating pravastatin 80mg  well but told it was not strong enough so changed to crestor 20mg  last yr - just with the last refill the price increased and she thinks the name brand was sent in instead of the generic.   She denies tinnitus SOB, and emesis.   Vitamin B and calciun  Interpreter [phone]  Patient Active Problem List   Diagnosis Date Noted  . Microcytic red blood cells 05/17/2016  . GERD (gastroesophageal reflux disease)  04/12/2016  . Black stools 04/11/2016  . RUQ abdominal pain 11/02/2015  . Abdominal pain, chronic, epigastric 11/02/2015  . Abdominal bloating 11/02/2015  . Chronic high back pain 09/24/2015  . Anxiety 09/08/2014  . HTN (hypertension) 10/03/2012  . Hyperlipidemia 10/03/2012  . DYSPEPSIA&OTHER [facility] DISORDERS FUNCTION STOMACH 07/20/2009   Past Medical History:  Diagnosis Date  . Abdominal pain, chronic, epigastric 11/02/2015   Started amitriptyline at hospitalization 04/13/2016 EGD x 2 negative Also w/ chronic chest wall and back pain Would try not to work up further   . Arthritis   . Back pain   . Chronic high back pain 09/24/2015  . Colon polyps   . GERD (gastroesophageal reflux disease)   . Hyperlipidemia   . Hypertension    Past Surgical History:  Procedure Laterality Date  . ABDOMINAL HYSTERECTOMY    . COLONOSCOPY    . ESOPHAGOGASTRODUODENOSCOPY    . ESOPHAGOGASTRODUODENOSCOPY [name] 04/13/2016   Procedure: ESOPHAGOGASTRODUODENOSCOPY (EGD);  Surgeon: Gatha Mayer, MD;  Location: [facility];  Service: Endoscopy;  Laterality: [name];   Allergies  Allergen Reactions  . Aspirin Hives  . Nsaids Hives   Prior to Admission medications   Medication Sig Start Date End Date Taking? Authorizing Provider  ALPRAZolam (XANAX) 0.25 MG tablet Take 1 tablet (0.25 mg total) by mouth 3 (three) times daily as needed. for anxiety 11/06/16  Yes Wendie Agreste, MD  esomeprazole ([place])  40 MG capsule Take 1 capsule (40 mg total) by mouth daily at 12 noon. 10/17/15  Yes Robyn Haber, MD  metoprolol succinate (TOPROL-XL) 25 MG 24 hr tablet Take 1 tablet (25 mg total) by mouth daily. 04/25/16  Yes Robyn Haber, MD  NIFEdipine (PROCARDIA-XL/ADALAT CC) 30 MG 24 hr tablet TAKE 1 TABLET (30 MG) BY MOUTH DAILY 04/25/16  Yes Robyn Haber, MD  rosuvastatin (CRESTOR) 20 MG tablet BRAND ONLY TAKE 1 TABLET BY MOUTH EVERY DAY. 01/09/17  Yes Wendie Agreste, MD  azithromycin (ZITHROMAX) 250 MG tablet Take 2  pills by mouth on day 1, then 1 pill by mouth per day on days 2 through 5. Patient not taking: Reported on 01/16/2017 12/29/16   Wendie Agreste, MD  benzonatate (TESSALON) 100 MG capsule Take 1 capsule (100 mg total) by mouth 3 (three) times daily as needed for cough. Patient not taking: Reported on 01/16/2017 12/29/16   Wendie Agreste, MD  dicyclomine (BENTYL) 20 MG tablet Take 1 tablet (20 mg total) by mouth 2 (two) times daily. Patient not taking: Reported on 01/16/2017 08/07/16   Doristine Devoid, PA-C  Efinaconazole 10 % SOLN Apply 1 application topically daily. Patient not taking: Reported on 01/16/2017 11/06/16   Wendie Agreste, MD   Social History   Social History  . Marital status: Married    Spouse name: [name]  . Number of children: 3  . Years of education: [name]   Occupational History  . Not on file.   Social History Main Topics  . Smoking status: Never Smoker  . Smokeless tobacco: Never Used  . Alcohol use No  . Drug use: No  . Sexual activity: Not on file   Other Topics Concern  . Not on file   Social History Narrative  . No narrative on file   Depression screen [facility] 2/9 01/16/2017 12/29/2016 11/06/2016 04/25/2016 11/27/2015  Decreased Interest 0 0 0 0 0  Down, Depressed, Hopeless 0 0 0 0 0  PHQ - 2 Score 0 0 0 0 0    Review of Systems  Constitutional: Positive for activity change, appetite change and fatigue.  HENT: Negative for hearing loss and tinnitus.   Respiratory: Negative for cough and shortness of breath.   Cardiovascular: Negative for leg swelling.  Gastrointestinal: Positive for abdominal pain and nausea. Negative for vomiting.  Genitourinary: Negative for decreased urine volume, difficulty urinating and dysuria.  Musculoskeletal: Positive for arthralgias and myalgias. Negative for gait problem and joint swelling.  Skin: Negative for rash.  Allergic/Immunologic: Negative for immunocompromised state.  Neurological: Positive for dizziness, weakness,  light-headedness, numbness and headaches. Negative for syncope (+pre-syncope) and speech difficulty.  Hematological: Positive for adenopathy.  Psychiatric/Behavioral: Negative for dysphoric mood.       Objective:   Physical Exam  Constitutional: She is oriented to person, place, and time. She appears well-developed and well-nourished. She appears lethargic. No distress.  HENT:  Head: Normocephalic and atraumatic.  Right Ear: Tympanic membrane, external ear and ear canal normal.  Left Ear: Tympanic membrane, external ear and ear canal normal.  Nose: Nose normal. No mucosal edema or rhinorrhea.  Mouth/Throat: Uvula is midline, oropharynx is clear and moist and mucous membranes are normal. No oropharyngeal exudate.  Bilateral cerumen impaction.   Eyes: Conjunctivae and EOM are normal. Right eye exhibits no discharge. Left eye exhibits no discharge. No scleral icterus.  Neck: Normal range of motion. Neck supple. No tracheal deviation present. No thyromegaly present.  Cardiovascular: Normal  rate, regular rhythm, normal heart sounds and intact distal pulses.   Pulmonary/Chest: Effort normal. No respiratory distress. She has rales ( bibasilar expiratory and inspiratory).  Abdominal: Soft. Bowel sounds are normal. She exhibits no distension and no mass. There is tenderness in the epigastric area. There is no rebound and no guarding.  Musculoskeletal: Normal range of motion.  Lymphadenopathy:    She has no cervical adenopathy.  Neurological: She is oriented to person, place, and time. She appears lethargic.  Negative Dicks Hallpike   Skin: Skin is warm and dry. She is not diaphoretic. No erythema.  Psychiatric: Her behavior is normal. She exhibits a depressed mood.  Nursing note and vitals reviewed.  Vitals:   01/16/17 1710  BP: 122/70  Pulse: 70  Temp: 97.9 F (36.6 C)  TempSrc: Oral  SpO2: 98%  Weight: 190 lb 9.6 oz (86.5 kg)  Height: 5\' 4"  (1.626 m)   Results for orders placed or  performed in visit on 01/16/17  POCT urinalysis dipstick  Result Value Ref Range   Color, UA yellow yellow   Clarity, UA clear clear   Glucose, UA negative negative   Bilirubin, UA negative negative   Ketones, POC UA negative negative   Spec Grav, UA 1.020    Blood, UA negative negative   pH, UA 7.0    Protein Ur, POC negative negative   Urobilinogen, UA 0.2    Nitrite, UA Negative Negative   Leukocytes, UA Trace (A) Negative  POCT CBC  Result Value Ref Range   WBC 6.8 4.6 - 10.2 K/uL   Lymph, poc 2.7 0.6 - 3.4   POC LYMPH PERCENT 39.4 10 - 50 %L   MID (cbc) 0.6 0 - 0.9   POC MID % 8.7 0 - 12 %M   POC Granulocyte 3.5 2 - 6.9   Granulocyte percent 51.9 37 - 80 %G   RBC 4.59 4.04 - 5.48 M/uL   Hemoglobin 11.9 (A) 12.2 - 16.2 g/dL   HCT, POC 35.0 (A) 37.7 - 47.9 %   MCV 76.1 (A) 80 - 97 fL   MCH, POC 25.9 (A) 27 - 31.2 pg   MCHC 34.1 31.8 - 35.4 g/dL   RDW, POC 14.7 %   Platelet Count, POC 216 142 - 424 K/uL   MPV 7.6 0 - 99.8 fL    Orthostatic VS for the past 24 hrs:  BP- Lying Pulse- Lying BP- Sitting Pulse- Sitting  01/16/17 1759 124/78 63 134/80 61  orthostatics negative   EKG- Normal sinus rhythm. No ischemic changes.   Dg Chest 2 View  Result Date: 01/16/2017 CLINICAL DATA:  Headache.  Abdominal pain.  Fatigue.  Chest rales. EXAM: CHEST  2 VIEW COMPARISON:  Two-view chest x-ray 12/29/2016. FINDINGS: The heart size and mediastinal contours are within normal limits. Both lungs are clear. The visualized skeletal structures are unremarkable. IMPRESSION: No active cardiopulmonary disease. Electronically Signed   By: San Morelle M.D.   On: 01/16/2017 18:32   Dg Chest 2 View  Result Date: 12/29/2016 CLINICAL DATA:  Cough and sore throat.  Fever. EXAM: CHEST  2 VIEW COMPARISON:  Chest radiograph August 11, 2014 and chest CT August 11, 2014 FINDINGS: There is no edema or consolidation. Heart size and pulmonary vascularity are normal. No adenopathy. There is  atherosclerotic calcification in the aorta. There is degenerative change in the thoracic spine. IMPRESSION: No edema or consolidation.  There is aortic atherosclerosis. Electronically Signed   By: Lowella Grip III  M.D.   On: 12/29/2016 14:21     Assessment & Plan:   1. Lightheadedness   2. Pruritus   3. Fatigue, unspecified type   4. Chest rales   5. Nausea without vomiting   6. Abdominal pain, epigastric   7. Hyperlipidemia, unspecified hyperlipidemia type     Pt with a wide variety of generalized complaints. Exam and w/u neg. Pt does look fatigued and depressed.  This is my first time meeting her but I see over the past year she has developed chronic abd pain of unknown etiology - still seeing GI and now has had sev mos of HAs and fatigue with normal eval so I do wonder if she could be depressed.  There is a language barrier (though we used the tablet translator and her bilingual husband was present throughout the duration as well). I did not want to bring up a poss somatization d/o at our first visit but rec she establish with PCP now that Dr. Carlean Jews has left so that she can explore her goals of care further.  After her visit, further review of chart shows that she has seen Dr. Mechele Claude and does have a h/o anxiety and panic requiring prn xanax. I do think it would be good try a ssri in this pt and rec spanish-speaking therapist to explore poss somatization I rec she do a trial off her statin for several wks as abd pain, nausea, HA, myalgias, weakness are all on it's side effect list.  She really doesn't have any other risk factors for heart disaese other than well controlled HTN. Rec flp in 6 mos - last was 04/2015 with LDL 94, non-hdl 117. She was switched to the crestor 20 from pravastatin 80 in 12/2013 when LDL 143.  We do not have any lipid panel prior to statin in Epic.  She does not have any other risk factors for heart disease other than some well-controlled HTN so I wonder if she could  try off statin for 6 mos then check lipid panel and run ascvd risk score?  Consider hs-crp?  She has never smoked and has no family history of coronary artery disease.  Saw Dr. Meda Coffee, cardiology, sev times, last visit 2 yrs prior who had no sig concerns for any evidence of increased atherosclerotic disease.   Orders Placed This Encounter  Procedures  . DG Chest 2 View    Standing Status:   Future    Number of Occurrences:   1    Standing Expiration Date:   01/16/2018    Order Specific Question:   Reason for Exam (SYMPTOM  OR DIAGNOSIS REQUIRED)    Answer:   fatigue, lightheaded, bibasilar rales    Order Specific Question:   Is the patient pregnant?    Answer:   No    Order Specific Question:   Preferred imaging location?    Answer:   External  . TSH  . CK  . C-reactive protein  . Comprehensive metabolic panel  . Sedimentation Rate  . Orthostatic vital signs  . POCT urinalysis dipstick  . POCT Microscopic Urinalysis ([facility])  . POCT CBC  . EKG 12-Lead   I personally performed the services described in this documentation, which was scribed in my presence. The recorded information has been reviewed and considered, and addended by me as needed.   Delman Cheadle, M.D.  [facility] [address] [place], [place] [address] [phone] phone [phone] fax  01/16/17 8:19 PM

## 2017-01-16 NOTE — Patient Instructions (Addendum)
IF you received an x-ray today, you will receive an invoice from [facility]. Please contact [facility] at [phone] with questions or concerns regarding your invoice.   IF you received labwork today, you will receive an invoice from [facility]. Please contact [facility] at [phone] with questions or concerns regarding your invoice.   Our billing staff will not be able to assist you with questions regarding bills from these companies.  You will be contacted with the lab results as soon as they are available. The fastest way to get your results is to activate your My Chart account. Instructions are located on the last page of this paperwork. If you have not heard from [place] regarding the results in 2 weeks, please contact this office.      Fatiga (Fatigue) La fatiga es una sensacin de cansancio en todo momento, falta de energa o falta de motivacin. La fatiga ocasional o leve con frecuencia es una reaccin normal a la actividad o la vida en general. Sin embargo, la fatiga de Engineer, site duracin (crnica) o extrema puede indicar una enfermedad preexistente. INSTRUCCIONES PARA EL CUIDADO EN EL HOGAR Controle su fatiga para ver si hay cambios. Las siguientes indicaciones ayudarn a Writer [employer] System pueda sentir:  Hable con el mdico acerca de cunto debe dormir cada noche. Trate de dormir la cantidad de tiempo requerida todas las noches.  Tome los medicamentos solamente como se lo haya indicado el mdico.  Siga una dieta saludable y nutritiva. Pida ayuda al mdico si necesita hacer cambios en su dieta.  Beba suficiente lquido para Consulting civil engineer orina clara o de color amarillo plido.  Practique actividades que lo relajen, como yoga, meditacin, terapia de [place] o acupuntura.  Haga ejercicios regularmente.  Cambie las situaciones que le provocan estrs. Trate de que su [place] y personal sea moderada.  No consuma drogas.  Limite el consumo  de alcohol a no ms de 1 medida por da si es mujer y no est Music therapist, y 2 medidas si es hombre. Una medida equivale a 12onzas de cerveza, 5onzas de vino o 1onzas de bebidas alcohlicas de alta graduacin.  Tome una multivitamina, si se lo indic el mdico. SOLICITE ATENCIN MDICA SI:  La fatiga no mejora.  Tiene fiebre.  Tiene prdida o aumento involuntario de [place].  Tiene dolores de [place].  Tiene dificultad para:  Dormirse.  Dormir durante toda la noche.  Se siente enojado, culpable, ansioso o triste.  No puede defecar (estreimiento).  Tiene la piel seca.  Tiene hinchadas las piernas u otra parte del cuerpo. SOLICITE ATENCIN MDICA DE INMEDIATO SI:  Se siente confundido.  Tiene visin borrosa.  Sufre mareos o se desmaya.  Sufre un dolor intenso de [place].  Siente dolor intenso en el abdomen, la pelvis o la espalda.  Tiene dolor de pecho, dificultad para respirar, o latidos cardacos irregulares o acelerados.  No puede orinar u [employer] de lo normal.  Presenta sangrado anormal, como sangrado del recto, la vagina, la nariz, los pulmones o los pezones.  Vomita sangre.  Tiene pensamientos acerca de hacerse dao a s mismo o cometer suicidio.  Le preocupa la posibilidad de hacerle dao a otra persona. Esta informacin no tiene Marine scientist el consejo del mdico. Asegrese de hacerle al mdico cualquier pregunta que tenga. Document Released: 03/28/2009 Document Revised: 04/02/2016 Document Reviewed: 04/13/2014 Elsevier Interactive Patient Education  2017 [place] general sin causa (General Headache Without Cause) El  dolor de cabeza es un dolor o Tree surgeon que se siente en la zona de la cabeza o del cuello. Puede no tener una causa especfica. Hay muchas causas y tipos de dolores de [place]. Los dolores de cabeza ms comunes son los siguientes:  Cefalea tensional.  Cefaleas migraosas.  Cefalea en brotes.  Cefaleas  diarias crnicas. INSTRUCCIONES PARA EL CUIDADO EN EL HOGAR Controle su afeccin para ver si hay cambios. Siga estos pasos para Aeronautical engineer afeccin: Control del [employer] medicamentos de venta libre y los recetados solamente como se lo haya indicado el mdico.  Cuando sienta dolor de cabeza acustese en un cuarto oscuro y tranquilo.  Si se lo indican, aplique hielo sobre la cabeza y la zona del cuello:  Ponga el hielo en una bolsa plstica.  Coloque una toalla entre la piel y la bolsa de hielo.  Coloque el hielo durante 89minutos, 2 a 3veces por Training and development officer.  Utilice una almohadilla trmica o tome una ducha con agua caliente para aplicar calor en la cabeza y la zona del cuello como se lo haya indicado el [place] luces tenues si le [employer] luces brillantes o sus dolores de cabeza empeoran. Comida y bebida   Mantenga un horario para las comidas.  Limite el consumo de bebidas alcohlicas.  Consuma menos cantidad de cafena o deje de tomarla. Instrucciones generales   Concurra a todas las visitas de control como se lo haya indicado el mdico. Esto es importante.  Lleve un diario de los dolores de cabeza para Neurosurgeon qu factores pueden desencadenarlos. Por ejemplo, escriba los siguientes datos:  Lo que usted come y Buyer, retail.  Cunto tiempo duerme.  Algn cambio en su dieta o en los medicamentos.  Pruebe algunas tcnicas de relajacin, como los [place].  Limite el estrs.  Sintese con la espalda recta y no tense los msculos.  No consuma productos que contengan tabaco, incluidos cigarrillos, tabaco de Higher education careers adviser o cigarrillos electrnicos. Si necesita ayuda para dejar de fumar, consulte al mdico.  Haga actividad fsica habitualmente como se lo haya indicado el mdico.  Tenga un horario fijo para dormir. Duerma entre 7 y 9horas o la cantidad de horas que le haya recomendado el mdico. SOLICITE ATENCIN MDICA SI:  Los medicamentos no Dealer los  sntomas.  Tiene un dolor de cabeza que es diferente del dolor de cabeza habitual.  Tiene nuseas o vmitos.  Tiene fiebre. SOLICITE ATENCIN MDICA DE INMEDIATO SI:  El dolor se hace cada vez ms intenso.  Ha vomitado repetidas veces.  Presenta rigidez en el cuello.  Sufre prdida de la visin.  Tiene problemas para hablar.  Siente dolor en el ojo o en el odo.  Presenta debilidad muscular o prdida del control muscular.  Pierde el equilibrio o tiene problemas para Writer.  Sufre mareos o se desmaya.  Se siente confundido. Esta informacin no tiene Marine scientist el consejo del mdico. Asegrese de hacerle al mdico cualquier pregunta que tenga. Document Released: 09/19/2005 Document Revised: 08/31/2015 Document Reviewed: 04/04/2015 Elsevier Interactive Patient Education  2017 [employer].

## 2017-01-17 LAB — COMPREHENSIVE METABOLIC PANEL
ALBUMIN: 4 g/dL (ref 3.5–5.5)
ALK PHOS: 113 IU/L (ref 39–117)
ALT: 19 IU/L (ref 0–32)
AST: 17 IU/L (ref 0–40)
Albumin/Globulin Ratio: 1.4 (ref 1.2–2.2)
BUN / CREAT RATIO: 15 (ref 9–23)
BUN: 8 mg/dL (ref 6–24)
CHLORIDE: 106 mmol/L (ref 96–106)
CO2: 25 mmol/L (ref 18–29)
Calcium: 9.5 mg/dL (ref 8.7–10.2)
Creatinine, Ser: 0.55 mg/dL — ABNORMAL LOW (ref 0.57–1.00)
GFR calc Af Amer: 119 mL/min/{1.73_m2} (ref >59)
GFR calc non Af Amer: 103 mL/min/{1.73_m2} (ref >59)
GLUCOSE: 89 mg/dL (ref 65–99)
Globulin, Total: 2.9 g/dL (ref 1.5–4.5)
POTASSIUM: 3.9 mmol/L (ref 3.5–5.2)
SODIUM: 144 mmol/L (ref 134–144)
Total Protein: 6.9 g/dL (ref 6.0–8.5)

## 2017-01-17 LAB — CBC WITH DIFFERENTIAL/PLATELET
BASOS ABS: 0 10*3/uL (ref 0.0–0.2)
Basos: 1 %
EOS (ABSOLUTE): 0.4 10*3/uL (ref 0.0–0.4)
Eos: 6 %
HEMOGLOBIN: 11.8 g/dL (ref 11.1–15.9)
Hematocrit: 36.3 % (ref 34.0–46.6)
Immature Grans (Abs): 0 10*3/uL (ref 0.0–0.1)
Immature Granulocytes: 0 %
LYMPHS ABS: 2.4 10*3/uL (ref 0.7–3.1)
Lymphs: 37 %
MCH: 25.5 pg — ABNORMAL LOW (ref 26.6–33.0)
MCHC: 32.5 g/dL (ref 31.5–35.7)
MCV: 78 fL — ABNORMAL LOW (ref 79–97)
MONOCYTES: 10 %
Monocytes Absolute: 0.6 10*3/uL (ref 0.1–0.9)
NEUTROS ABS: 3 10*3/uL (ref 1.4–7.0)
Neutrophils: 46 %
Platelets: 258 10*3/uL (ref 150–379)
RBC: 4.63 x10E6/uL (ref 3.77–5.28)
RDW: 14.8 % (ref 12.3–15.4)
WBC: 6.4 10*3/uL (ref 3.4–10.8)

## 2017-01-17 LAB — TSH: TSH: 1.47 u[IU]/mL (ref 0.450–4.500)

## 2017-01-17 LAB — C-REACTIVE PROTEIN: CRP: 2.7 mg/L (ref 0.0–4.9)

## 2017-01-17 LAB — CK: CK TOTAL: 95 U/L (ref 24–173)

## 2017-01-17 LAB — SEDIMENTATION RATE: SED RATE: 8 mm/h (ref 0–40)

## 2017-02-23 ENCOUNTER — Other Ambulatory Visit: Payer: Self-pay | Admitting: Family Medicine

## 2017-02-23 DIAGNOSIS — I10 Essential (primary) hypertension: Secondary | ICD-10-CM

## 2017-02-26 ENCOUNTER — Ambulatory Visit: Payer: BLUE CROSS/BLUE SHIELD | Admitting: Gastroenterology

## 2017-04-08 ENCOUNTER — Ambulatory Visit (INDEPENDENT_AMBULATORY_CARE_PROVIDER_SITE_OTHER): Payer: BLUE CROSS/BLUE SHIELD

## 2017-04-08 ENCOUNTER — Ambulatory Visit (INDEPENDENT_AMBULATORY_CARE_PROVIDER_SITE_OTHER): Payer: BLUE CROSS/BLUE SHIELD | Admitting: Physician Assistant

## 2017-04-08 VITALS — BP 111/76 | HR 64 | Temp 98.5°F | Resp 17 | Ht 64.0 in | Wt 187.0 lb

## 2017-04-08 DIAGNOSIS — M79675 Pain in left toe(s): Secondary | ICD-10-CM | POA: Diagnosis not present

## 2017-04-08 DIAGNOSIS — S92515A Nondisplaced fracture of proximal phalanx of left lesser toe(s), initial encounter for closed fracture: Secondary | ICD-10-CM

## 2017-04-08 NOTE — Progress Notes (Signed)
Emily [name]  MRN: [id number] DOB: Aug 25, 1956  PCP: Robyn Haber, MD  Subjective:  Pt is a 61 year old female PMH HTN, GERD, HLD, anxiety who presents to clinic for left foot pain x 2 weeks. Mobile interpreter used today [phone]. She kicked a door frame at home with the pinky finger of her left toe.  Iced it - swelling improved. Pain is lingering and will not go away. She cannot wear shoe due to pain. Endorses reduced ROM.   Denies numbness, tingling, temperature changes, sensory change.   Review of Systems  Constitutional: Negative for chills, diaphoresis, fatigue and fever.  Respiratory: Negative for cough, chest tightness, shortness of breath and wheezing.   Cardiovascular: Negative for chest pain, palpitations and leg swelling.  Gastrointestinal: Negative for abdominal pain, diarrhea, nausea and vomiting.  Musculoskeletal: Positive for arthralgias (toe left foot) and gait problem.  Skin: Positive for color change.  Neurological: Negative for weakness, light-headedness and headaches.    Patient Active Problem List   Diagnosis Date Noted  . Microcytic red blood cells 05/17/2016  . GERD (gastroesophageal reflux disease) 04/12/2016  . Black stools 04/11/2016  . RUQ abdominal pain 11/02/2015  . Abdominal pain, chronic, epigastric 11/02/2015  . Abdominal bloating 11/02/2015  . Chronic high back pain 09/24/2015  . Anxiety 09/08/2014  . HTN (hypertension) 10/03/2012  . Hyperlipidemia 10/03/2012  . DYSPEPSIA&OTHER [facility] DISORDERS FUNCTION STOMACH 07/20/2009    Current Outpatient Prescriptions on File Prior to Visit  Medication Sig Dispense Refill  . ALPRAZolam (XANAX) 0.25 MG tablet Take 1 tablet (0.25 mg total) by mouth 3 (three) times daily as needed. for anxiety 30 tablet 0  . esomeprazole (NEXIUM) 40 MG capsule Take 1 capsule (40 mg total) by mouth daily at 12 noon. 90 capsule 1  . metoprolol succinate (TOPROL-XL) 25 MG 24 hr tablet TAKE 1 TABLET(25 MG) BY MOUTH  DAILY 90 tablet 0  . NIFEdipine (PROCARDIA-XL/ADALAT CC) 30 MG 24 hr tablet TAKE 1 TABLET (30 MG) BY MOUTH DAILY 90 tablet 3  . rosuvastatin (CRESTOR) 20 MG tablet Take 1 tablet (20 mg total) by mouth daily. 90 tablet 1   No current facility-administered medications on file prior to visit.     Allergies  Allergen Reactions  . Aspirin Hives  . Atorvastatin     Yellow fingernails  . Nsaids Hives     Objective:  BP 111/76 (BP Location: Right Arm, Patient Position: Sitting, Cuff Size: Normal)   Pulse 64   Temp 98.5 F (36.9 C) (Oral)   Resp 17   Ht 5\' 4"  (1.626 m)   Wt 187 lb (84.8 kg)   SpO2 98%   BMI 32.10 kg/m   Physical Exam  Constitutional: She is oriented to person, place, and time and well-developed, well-nourished, and in no distress. No distress.  Cardiovascular: Normal rate, regular rhythm and normal heart sounds.   Musculoskeletal:       Left foot: There is decreased range of motion and bony tenderness (along 5th distal and proximal phalanx). There is no swelling, normal capillary refill, no crepitus, no deformity and no laceration.  Neurological: She is alert and oriented to person, place, and time. GCS score is 15.  Skin: Skin is warm and dry.  Psychiatric: Mood, memory, affect and judgment normal.  Vitals reviewed.  Dg Foot Complete Left  Result Date: 04/08/2017 CLINICAL DATA:  trauma - kicked a door. 4th and 5th toe pain. EXAM: LEFT FOOT - COMPLETE 3+ VIEW COMPARISON:  None. FINDINGS:  Minimally displaced fracture within the midshaft region of the left fifth proximal phalanx. No other osseous fracture seen. Joint spaces are appropriately aligned throughout. IMPRESSION: Minimally displaced fracture within the midshaft region of the left fifth proximal phalanx. Alignment of the underlying fifth MTP joint is normal. Electronically Signed   By: Franki Cabot M.D.   On: 04/08/2017 18:08    Assessment and Plan :  1. Closed nondisplaced fracture of proximal phalanx of  lesser toe of left foot, initial encounter 2. Pain of toe of left foot - DG Foot Complete Left; Future - RTC in 1-2 weeks for follow-up. No f/u x-rays needed.   - Encouraged ice and elevation. Buddy tape and post-op shoe placed on pt.  - Continue wearing a hard-sole shoe as much as possible during healing time. Buddy tape in place for 3-6 weeks. Total healing time for a fractured toe is 4-6 weeks.  - Note for work written asking for restrictions.   Mercer Pod, PA-C  [facility] 04/08/2017 5:43 PM

## 2017-04-08 NOTE — Patient Instructions (Addendum)
You have a fracture of your toe.  Come back in 1-2 weeks for follow-up.  Ice and elevate your toe daily. Elevate the fracture site above the level of the heart.  Do not ice for more than 20 min every hour.  Continue wearing a hard-sole shoe as much as possible during healing time.  Buddy tape in place for 3-6 weeks.  Take Tylenol 500mg  every 4 hours as needed for pain.  Total healing time for a fractured toe is 4-6 weeks. You may experience pain up to several months.  No f/u x-rays needed.    Fractura de un dedo del pie (Toe Fracture) Una fractura de un dedo del pie es una quebradura en uno de los huesos de los dedos del pie (falanges). CAUSAS Esta afeccin puede ser causada por lo siguiente:  La cada de un objeto pesado sobre el dedo del pie.  Un golpe en el dedo del pie.  El uso excesivo del dedo del pie o la realizacin de ejercicios repetitivos.  La torsin o el estiramiento del dedo del pie con desplazamiento. FACTORES DE RIESGO Es ms probable que esta afeccin se manifieste en las personas que:  Therapist, occupational deportes de Diplomatic Services operational officer.  Tienen una enfermedad [employer].  Tienen bajos niveles de calcio. SNTOMAS Los principales sntomas de esta afeccin son la hinchazn y el dolor del dedo del pie. El dolor puede intensificarse al estar parado o caminar. Otros sntomas pueden ser los siguientes:  Hematomas.  Rigidez.  Entumecimiento.  Un cambio en el aspecto del dedo del pie.  Huesos fracturados que [employer].  Sangre debajo de la ua del pie. DIAGNSTICO Esta afeccin se diagnostica mediante un examen fsico. Tambin puede ser necesario que le tomen radiografas. TRATAMIENTO El tratamiento de esta afeccin depende del tipo de fractura y de la gravedad. El tratamiento puede incluir lo siguiente:  Vendar el dedo fracturado del pie junto con un dedo adyacente (vendaje de inmovilizacin). Este es el tratamiento ms frecuente de las fracturas en las que el  hueso no se ha desplazado de su lugar (fractura sin desplazamiento).  Usar un calzado con suela ancha y rgida para proteger el dedo del pie y limitar su movimiento.  Usar un yeso para caminar.  Someterse a un procedimiento para reacomodar el dedo del pie.  Ciruga. Esta puede ser necesaria en los siguientes casos:  Si hay muchos fragmentos de hueso fracturado que estn fuera de su lugar (desplazados).  Si se fractura la articulacin del dedo del pie.  Si el hueso atraviesa la piel.  Fisioterapia. Esta se realiza para ayudar a Estate manager/land agent movimiento y la fuerza del dedo del pie. Tal vez deba hacerse radiografas de control para asegurarse de que el hueso se est consolidando bien y se Quarry manager en su posicin. INSTRUCCIONES PARA EL CUIDADO EN EL HOGAR Si tiene un yeso:  No introduzca nada adentro del yeso para rascarse la piel. Esto puede aumentar el riesgo de tener infecciones.  [place] piel de alrededor del yeso. Informe al mdico cualquier inquietud que tenga. Puede aplicar una locin en la piel seca alrededor de los bordes del yeso. No aplique locin en la piel por debajo del yeso.  No ejerza presin en ninguna parte del yeso hasta que se haya endurecido por completo. Esto puede tomar [employer].  Mantenga el yeso seco y limpio. El bao  No tome baos de inmersin, no nade ni use el jacuzzi hasta que el mdico lo autorice. Pregntele  al mdico si puede ducharse. Thurston Pounds solo le permitan tomar baos de [place].  Si el mdico lo autoriza a que tome baos de inmersin y se duche, [place] el yeso o la venda (vendaje) con una bolsa de plstico hermtica para protegerlos del agua. No permita que el yeso o el vendaje se mojen. Control del dolor, la rigidez y la hinchazn  Si no tiene un yeso, aplique hielo sobre la zona de la lesin, si se lo indicaron.  Ponga el hielo en una bolsa plstica.  Coloque una toalla entre la piel y la bolsa de hielo.  Coloque el  hielo durante 14minutos, 2 a 3veces por Training and development officer.  Mueva los dedos de los pies con frecuencia para evitar que se entumezcan y para reducir la hinchazn.  Cuando est sentado o acostado, eleve la zona de la lesin por encima del nivel del corazn. Conducir  No conduzca ni opere maquinaria pesada mientras toma analgsicos.  No conduzca mientras [place] un yeso en un pie. Actividad  Reanude sus actividades normales como se lo haya indicado el mdico. Pregntele al mdico qu actividades son seguras para usted.  Haga ejercicio a diario como se lo haya indicado el fisioterapeuta o el mdico. Seguridad  No apoye el peso del cuerpo sobre la extremidad lesionada hasta que lo autorice el mdico. Use muletas u otros dispositivos de Runner, broadcasting/film/video se lo haya indicado el mdico. Instrucciones generales  Si se trat el dedo con un vendaje de inmovilizacin, siga las indicaciones del mdico en lo que respecta al cambio de la gasa y la [place]. Cmbielas con ms frecuencia:  Si la gasa y la [place] se mojan. Si esto ocurre, seque el [employer].  Si la gasa y la cinta adhesiva estn muy ajustadas y hacen que el dedo del pie se torne plido o se entumezca.  Use calzado protector como se lo haya indicado el mdico. Si no le indicaron un calzado de este tipo, use uno que sea resistente y tenga buen 23. El calzado no debe comprimirle ni apretarle los dedos.  No consuma ningn producto que contenga tabaco, lo que incluye cigarrillos, tabaco de Higher education careers adviser o Psychologist, sport and exercise. El tabaco puede retardar la consolidacin de la fractura. Si necesita ayuda para dejar de fumar, consulte al mdico.  Tome los medicamentos solamente como se lo haya indicado el mdico.  Concurra a todas las visitas de control como se lo haya indicado el mdico. Esto es importante. SOLICITE ATENCIN MDICA SI:  Jaclynn Guarneri.  El medicamento no Production designer, theatre/television/film.  El dedo del pie est fro.  El dedo del  pie est entumecido.  Sigue teniendo dolor despus de una semana de reposo y [place].  Sigue teniendo [employer] de que el mdico le haya indicado que puede empezar a caminar nuevamente.  Siente dolor, hormigueo o entumecimiento en el pie, y estos sntomas no desaparecen. SOLICITE ATENCIN MDICA DE INMEDIATO SI:  Siente dolor intenso.  Tiene enrojecimiento o inflamacin en el dedo del pie, y estos sntomas empeoran.  Siente dolor o entumecimiento en el dedo del pie, y estos sntomas empeoran.  El dedo del pie se torna de color [place]. Esta informacin no tiene Marine scientist el consejo del mdico. Asegrese de hacerle al mdico cualquier pregunta que tenga. Document Released: 09/19/2005 Document Revised: 04/02/2016 Document Reviewed: 10/06/2014 Elsevier Interactive Patient Education  2017 [employer].  IF you received an x-ray today, you will receive an invoice from [facility]. Please contact  [facility] at [phone] with questions or concerns regarding your invoice.   IF you received labwork today, you will receive an invoice from [place]. Please contact [facility] at [phone] with questions or concerns regarding your invoice.   Our billing staff will not be able to assist you with questions regarding bills from these companies.  You will be contacted with the lab results as soon as they are available. The fastest way to get your results is to activate your My Chart account. Instructions are located on the last page of this paperwork. If you have not heard from [place] regarding the results in 2 weeks, please contact this office.

## 2017-04-09 ENCOUNTER — Encounter: Payer: Self-pay | Admitting: Gastroenterology

## 2017-04-22 ENCOUNTER — Encounter: Payer: Self-pay | Admitting: Physician Assistant

## 2017-04-22 ENCOUNTER — Ambulatory Visit (INDEPENDENT_AMBULATORY_CARE_PROVIDER_SITE_OTHER): Payer: BLUE CROSS/BLUE SHIELD | Admitting: Physician Assistant

## 2017-04-22 ENCOUNTER — Ambulatory Visit: Payer: BLUE CROSS/BLUE SHIELD | Admitting: Physician Assistant

## 2017-04-22 VITALS — BP 114/74 | HR 68 | Temp 98.2°F | Resp 17 | Ht 64.0 in | Wt 187.0 lb

## 2017-04-22 DIAGNOSIS — M79674 Pain in right toe(s): Secondary | ICD-10-CM | POA: Diagnosis not present

## 2017-04-22 DIAGNOSIS — F418 Other specified anxiety disorders: Secondary | ICD-10-CM | POA: Diagnosis not present

## 2017-04-22 DIAGNOSIS — S92502D Displaced unspecified fracture of left lesser toe(s), subsequent encounter for fracture with routine healing: Secondary | ICD-10-CM

## 2017-04-22 MED ORDER — ALPRAZOLAM 0.25 MG PO TABS: 0.2500 mg | ORAL_TABLET | Freq: Three times a day (TID) | ORAL | 0 refills | Status: DC | PRN

## 2017-04-22 NOTE — Patient Instructions (Addendum)
Follow-up with me in 1-2 weeks.   Continue wearing a hard-sole shoe as much as possible during healing time. Buddy tape in place for 1-3 more weeks. Total healing time for a fractured toe is 4-6 weeks. Tylenol for pain. Continue to elevate and rest your foot.   Thank you for coming in today. I hope you feel we met your needs.  Feel free to call [facility] if you have any questions or further requests.  Please consider signing up for MyChart if you do not already have it, as this is a great way to communicate with me.  Best,  Whitney McVey, PA-C  IF you received an x-ray today, you will receive an invoice from [facility]. Please contact [facility] at [phone] with questions or concerns regarding your invoice.   IF you received labwork today, you will receive an invoice from [facility]. Please contact [facility] at [phone] with questions or concerns regarding your invoice.   Our billing staff will not be able to assist you with questions regarding bills from these companies.  You will be contacted with the lab results as soon as they are available. The fastest way to get your results is to activate your My Chart account. Instructions are located on the last page of this paperwork. If you have not heard from us regarding the results in 2 weeks, please contact this office.    We recommend that you schedule a mammogram for breast cancer screening. Typically, you do not need a referral to do this. Please contact a local imaging center to schedule your mammogram.  [facility] - [phone]  *ask for the Radiology Department [facility]) - [phone] or [phone]  [facility] - [phone] [facility] - [phone] [facility] - [phone]  *ask for the Radiology Department [facility] - [phone]  *ask for the Radiology Department [facility] - [phone]  *ask  for the Mammography Department [facility] - [phone] 

## 2017-04-22 NOTE — Progress Notes (Signed)
Emily [name]  MRN: [id number] DOB: 07/20/1956  PCP: Gelene Mink Keyonni Percival, PA-C  Subjective:  Pt is a 61 year old female PMH HTN, GERD, HLD, anxiety who presents to clinic for follow-up foot injury. Mobile interpreter used today.   She was here two weeks ago after she kicked a door frame at home with the outside of her left foot, x-ray showed a fractured little toe. She was encouraged to keep it elevated and iced, apply buddy tape as much as possible and wear post-op shoe. She has remained out of work since that time.   Today she c/o intermittent redness and inflammation that is relieved with ice and elevation. Pain is worse when inflammation is present. Painful when she wiggles her toes. She is taking Tylenol for pain - this is helping.  She is wearing her post-op shoe daily along with buddy tape.   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 She feels like she is unable to return to work at this time - weight bearing is painful.     H/o anxiety - controlled with Xanax 0.25 mg. Last refill was 10/2016, Rx 30 pills with no refills. She takes this PRN weekly for sleep.   Review of Systems  Musculoskeletal: Positive for arthralgias (left baby toe) and gait problem. Negative for joint swelling.  Skin: Negative.   Psychiatric/Behavioral: Positive for sleep disturbance. The patient is nervous/anxious.     Patient Active Problem List   Diagnosis Date Noted  . Microcytic red blood cells 05/17/2016  . GERD (gastroesophageal reflux disease) 04/12/2016  . Black stools 04/11/2016  . RUQ abdominal pain 11/02/2015  . Abdominal pain, chronic, epigastric 11/02/2015  . Abdominal bloating 11/02/2015  . Chronic high back pain 09/24/2015  . Anxiety 09/08/2014  . HTN (hypertension) 10/03/2012  . Hyperlipidemia 10/03/2012  . DYSPEPSIA&OTHER [facility] DISORDERS FUNCTION STOMACH 07/20/2009    Current Outpatient Prescriptions on File Prior to Visit  Medication Sig  Dispense Refill  . ALPRAZolam (XANAX) 0.25 MG tablet Take 1 tablet (0.25 mg total) by mouth 3 (three) times daily as needed. for anxiety 30 tablet 0  . esomeprazole (NEXIUM) 40 MG capsule Take 1 capsule (40 mg total) by mouth daily at 12 noon. 90 capsule 1  . metoprolol succinate (TOPROL-XL) 25 MG 24 hr tablet TAKE 1 TABLET(25 MG) BY MOUTH DAILY 90 tablet 0  . NIFEdipine (PROCARDIA-XL/ADALAT CC) 30 MG 24 hr tablet TAKE 1 TABLET (30 MG) BY MOUTH DAILY 90 tablet 3  . rosuvastatin (CRESTOR) 20 MG tablet Take 1 tablet (20 mg total) by mouth daily. 90 tablet 1   No current facility-administered medications on file prior to visit.     Allergies  Allergen Reactions  . Aspirin Hives  . Atorvastatin     Yellow fingernails  . Nsaids Hives     Objective:  BP 114/74 (BP Location: Right Arm, Patient Position: Sitting, Cuff Size: Normal)   Pulse 68   Temp 98.2 F (36.8 C) (Oral)   Resp 17   Ht 5\' 4"  (1.626 m)   Wt 187 lb (84.8 kg)   SpO2 98%   BMI 32.10 kg/m   Physical Exam  Constitutional: She is oriented to person, place, and time and well-developed, well-nourished, and in no distress. No distress.  Cardiovascular: Normal rate, regular rhythm and normal heart sounds.   Musculoskeletal:       Left foot: There is bony tenderness (proximal 5th phalanx). There is normal range of motion,  no swelling and no crepitus.  Neurological: She is alert and oriented to person, place, and time. GCS score is 15.  Skin: Skin is warm and dry.  Psychiatric: Mood, memory, affect and judgment normal.  Vitals reviewed.   Assessment and Plan :  1. Closed fracture of phalanx of left fifth toe with routine healing, subsequent encounter 2. Pain of toe of right foot - Toe is doing well with routine healing. Expect 1 more week out of work. RTC in 1 week. Tylenol for pain. Con't post-op shoe and buddy tape. Elevate and ice as needed for pain/swelling.  3. Situational anxiety - ALPRAZolam (XANAX) 0.25 MG tablet;  Take 1 tablet (0.25 mg total) by mouth 3 (three) times daily as needed. for anxiety  Dispense: 30 tablet; Refill: 0   Mercer Pod, PA-C  [facility] [place] 04/22/2017 5:26 PM

## 2017-04-29 ENCOUNTER — Encounter: Payer: Self-pay | Admitting: Physician Assistant

## 2017-04-29 ENCOUNTER — Ambulatory Visit (INDEPENDENT_AMBULATORY_CARE_PROVIDER_SITE_OTHER): Payer: BLUE CROSS/BLUE SHIELD | Admitting: Physician Assistant

## 2017-04-29 VITALS — BP 93/62 | HR 71 | Temp 97.2°F | Resp 18 | Ht 64.0 in | Wt 190.6 lb

## 2017-04-29 DIAGNOSIS — I10 Essential (primary) hypertension: Secondary | ICD-10-CM | POA: Diagnosis not present

## 2017-04-29 DIAGNOSIS — Z760 Encounter for issue of repeat prescription: Secondary | ICD-10-CM | POA: Diagnosis not present

## 2017-04-29 DIAGNOSIS — M79675 Pain in left toe(s): Secondary | ICD-10-CM | POA: Diagnosis not present

## 2017-04-29 DIAGNOSIS — E785 Hyperlipidemia, unspecified: Secondary | ICD-10-CM

## 2017-04-29 DIAGNOSIS — S92515D Nondisplaced fracture of proximal phalanx of left lesser toe(s), subsequent encounter for fracture with routine healing: Secondary | ICD-10-CM | POA: Diagnosis not present

## 2017-04-29 MED ORDER — ROSUVASTATIN CALCIUM 20 MG PO TABS: 20.0000 mg | ORAL_TABLET | Freq: Every day | ORAL | 0 refills | Status: DC

## 2017-04-29 MED ORDER — NIFEDIPINE ER 30 MG PO TB24: ORAL_TABLET | ORAL | 0 refills | Status: DC

## 2017-04-29 MED ORDER — METOPROLOL SUCCINATE ER 25 MG PO TB24: ORAL_TABLET | ORAL | 0 refills | Status: DC

## 2017-04-29 NOTE — Patient Instructions (Addendum)
Please schedule an "ANNUAL EXAM" with me in the next 3-6 weeks.   Continue wearing a hard-sole shoe as much as possible during healing time. Buddy tape in place for 1-2 more weeks.  Total healing time for a fractured toe is 4-6 weeks.  Tylenol for pain. Continue to elevate and rest your foot.    Thank you for coming in today. I hope you feel we met your needs.  Feel free to call [facility] if you have any questions or further requests.  Please consider signing up for MyChart if you do not already have it, as this is a great way to communicate with me.  Best,  Whitney McVey, PA-C  IF you received an x-ray today, you will receive an invoice from [facility]. Please contact [facility] at [phone] with questions or concerns regarding your invoice.   IF you received labwork today, you will receive an invoice from [place]. Please contact [facility] at [phone] with questions or concerns regarding your invoice.   Our billing staff will not be able to assist you with questions regarding bills from these companies.  You will be contacted with the lab results as soon as they are available. The fastest way to get your results is to activate your My Chart account. Instructions are located on the last page of this paperwork. If you have not heard from [place] regarding the results in 2 weeks, please contact this office.

## 2017-04-29 NOTE — Progress Notes (Signed)
Emily [name]  MRN: [id number] DOB: May 01, 1956  PCP: Dorise Hiss, PA-C  Subjective:  Pt is a 61 year old female who presents to clinic for f/u left foot pain s/p fracture of toe. Mobile interpreter used today [phone].    This is her second follow-up appointment. Last OV was 1 week ago. 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  At her last OV she c/o inflammation and pain relieved with rest and elevation she was asked to continue wearing a hard-sole shoe as much as possible, apply buddy tape, use Tylenol for pain and continue to elevate and rest foot. Last OV note return to work written with restrictions including her being allowed to rest and elevate her foot intermittently throughout the day.   Today she states she is doing much better. She is 75% better. She chose to wait 1 more week before returning to work. She is wearing her hard-sole shoe around her house, she is not wearing it when she goes out. The buddy tape is blistering her skin - she is no longer buddy-taping.   Denies n/t, weakness, redness, swelling, exquisite tenderness.   Medication refill - She needs refill of Metoprolol, rosuvastatin, procardia.  H/o HTN - Today's blood pressure is 93/62. She took her blood pressure medication this morning. She has not eaten anything since early this morning. Denies lightheadedness, syncope, presyncope, headache, chest pain.   Her last annual exam was >1 year ago. There are multiple care gaps for this patient including PAP, MM, HIV screening, Hep C screening.  Review of Systems  Cardiovascular: Negative for chest pain and palpitations.  Musculoskeletal: Positive for arthralgias (left little toe) and gait problem.  Skin: Negative.   Neurological: Negative for syncope, light-headedness, numbness and headaches.    Patient Active Problem List   Diagnosis Date Noted  . Microcytic red blood cells 05/17/2016  . GERD (gastroesophageal reflux  disease) 04/12/2016  . Black stools 04/11/2016  . RUQ abdominal pain 11/02/2015  . Abdominal pain, chronic, epigastric 11/02/2015  . Abdominal bloating 11/02/2015  . Chronic high back pain 09/24/2015  . Anxiety 09/08/2014  . HTN (hypertension) 10/03/2012  . Hyperlipidemia 10/03/2012  . DYSPEPSIA&OTHER [facility] DISORDERS FUNCTION STOMACH 07/20/2009    Current Outpatient Prescriptions on File Prior to Visit  Medication Sig Dispense Refill  . ALPRAZolam (XANAX) 0.25 MG tablet Take 1 tablet (0.25 mg total) by mouth 3 (three) times daily as needed. for anxiety 30 tablet 0  . esomeprazole (NEXIUM) 40 MG capsule Take 1 capsule (40 mg total) by mouth daily at 12 noon. 90 capsule 1  . metoprolol succinate (TOPROL-XL) 25 MG 24 hr tablet TAKE 1 TABLET(25 MG) BY MOUTH DAILY 90 tablet 0  . NIFEdipine (PROCARDIA-XL/ADALAT CC) 30 MG 24 hr tablet TAKE 1 TABLET (30 MG) BY MOUTH DAILY 90 tablet 3  . rosuvastatin (CRESTOR) 20 MG tablet Take 1 tablet (20 mg total) by mouth daily. 90 tablet 1   No current facility-administered medications on file prior to visit.     Allergies  Allergen Reactions  . Aspirin Hives  . Atorvastatin     Yellow fingernails  . Nsaids Hives     Objective:  BP 93/62   Pulse 71   Temp 97.2 F (36.2 C) (Oral)   Resp 18   Ht 5\' 4"  (1.626 m)   Wt 190 lb 9.6 oz (86.5 kg)   SpO2 97%   BMI 32.72 kg/m   Physical Exam  Constitutional: She is oriented to person, place, and time and well-developed, well-nourished, and in no distress. No distress.  Cardiovascular: Normal rate, regular rhythm and normal heart sounds.   Pulmonary/Chest: Effort normal. No respiratory distress.  Musculoskeletal:       Left foot: There is normal range of motion, no tenderness, no bony tenderness, no swelling and no crepitus.  Neurological: She is alert and oriented to person, place, and time. GCS score is 15.  Skin: Skin is warm and dry.  Psychiatric: Mood, memory, affect and judgment normal.    Vitals reviewed.  Lab Results  Component Value Date   CHOL 166 05/22/2015   HDL 49 05/22/2015   LDLCALC 94 05/22/2015   LDLDIRECT 174 (H) 10/02/2012   TRIG 117 05/22/2015   CHOLHDL 3.4 05/22/2015    Assessment and Plan :  1. Closed nondisplaced fracture of proximal phalanx of lesser toe of left foot with routine healing, subsequent encounter 2. Pain of toe of left foot - Pt reports much improvement today. Her fracture is healing well and as expected. OK to return to work without restrictions. Tylenol for pain. Encouraged con't buddy tape for 1-2 weeks.   3. Encounter for medication refill 4. Essential hypertension - metoprolol succinate (TOPROL-XL) 25 MG 24 hr tablet; TAKE 1 TABLET(25 MG) BY MOUTH DAILY  Dispense: 45 tablet; Refill: 0 - NIFEdipine (PROCARDIA-XL/ADALAT CC) 30 MG 24 hr tablet; TAKE 1 TABLET (30 MG) BY MOUTH DAILY  Dispense: 45 tablet; Refill: 0 - Blood pressure is low today. Pt is asymptomatic and has not eaten today. Encouraged pt to go home and eat. Check blood pressures at home. 45 day supple written. RTC in 3-5 weeks for annual exam.  5. Dyslipidemia - rosuvastatin (CRESTOR) 20 MG tablet; Take 1 tablet (20 mg total) by mouth daily.  Dispense: 45 tablet; Refill: 0 - Last lipids drawn in 2016. RTC in 3-5 weeks for annual exam. Will draw labs at that time.    Mercer Pod, PA-C  [facility] at [place] 04/29/2017 5:38 PM

## 2017-06-22 ENCOUNTER — Ambulatory Visit (INDEPENDENT_AMBULATORY_CARE_PROVIDER_SITE_OTHER): Payer: BLUE CROSS/BLUE SHIELD | Admitting: Urgent Care

## 2017-06-22 ENCOUNTER — Encounter: Payer: Self-pay | Admitting: Family Medicine

## 2017-06-22 VITALS — BP 101/67 | HR 68 | Temp 98.1°F | Resp 16 | Ht 64.0 in | Wt 184.6 lb

## 2017-06-22 DIAGNOSIS — I10 Essential (primary) hypertension: Secondary | ICD-10-CM

## 2017-06-22 DIAGNOSIS — M542 Cervicalgia: Secondary | ICD-10-CM

## 2017-06-22 DIAGNOSIS — R002 Palpitations: Secondary | ICD-10-CM

## 2017-06-22 NOTE — Progress Notes (Signed)
    MRN: [id number] DOB: [birth year]  Subjective:   Emily [name] is a 61 y.o. female presenting for chief complaint of Neck Pain (coratid artery ); Hyperlipidemia; and Diabetes    Emily [name] has a current medication list which includes the following prescription(s): alprazolam, esomeprazole, metoprolol succinate, nifedipine, and rosuvastatin. Also is allergic to aspirin; atorvastatin; and nsaids.  Emily [name]  has a past medical history of Abdominal pain, chronic, epigastric (11/02/2015); Arthritis; Back pain; Beta thalassemia minor; Chronic high back pain (09/24/2015); Colon polyps; GERD (gastroesophageal reflux disease); Hyperlipidemia; Hypertension; and Sickle cell trait ([place]). Also  has a past surgical history that includes Abdominal hysterectomy; Colonoscopy; Esophagogastroduodenoscopy; and Esophagogastroduodenoscopy ([name], 04/13/2016).  Objective:   Vitals: BP 101/67   Pulse 68   Temp 98.1 F (36.7 C) (Oral)   Resp 16   Ht 5\' 4"  (1.626 m)   Wt 184 lb 9.6 oz (83.7 kg)   SpO2 95%   BMI 31.69 kg/m   Physical Exam  Assessment and Plan :     Jaynee Eagles, PA-C [facility] [place] [phone] 06/22/2017  2:38 PM

## 2017-06-22 NOTE — Patient Instructions (Addendum)
Palpitaciones (Palpitations) Es la sensacin de sentir que el latido cardaco es irregular o es ms rpido que lo normal. Puede sentir como un aleteo o que falta un latido. Generalmente no es un problema grave. Monona palpitaciones pueden ser diversas, entre ellas, el estrs y consumo de cigarrillos, cafena, alcohol y determinados medicamentos. Si bien la [employer] de las causas de las palpitaciones no son graves, estas pueden ser un signo de un problema mdico grave. En algunos casos, podra ser necesario hacer ms estudios. INSTRUCCIONES PARA EL CUIDADO EN EL HOGAR Est atento a cualquier cambio en los sntomas. Tome estas medidas para controlar la afeccin:  Evite consumir lo siguiente: ? TEFL teacher que contengan cafena como el caf, el t, los refrescos, las pastillas para Horticulturist, commercial y las bebidas energizantes. ? Chocolate. ? Alcohol.  No consuma ningn producto que contenga tabaco, lo que incluye cigarrillos, tabaco de Higher education careers adviser y Psychologist, sport and exercise. Si necesita ayuda para dejar de fumar, consulte al [employer].  Trate de reducir los niveles de estrs y [place]. Las siguientes actividades pueden ayudarlo a relajarse: ? Practicar yoga. ? Meditacin. ? Actividad fsica como natacin, trote o caminatas. ? Biorretroalimentacin. Este es un mtodo que le ensea a usar la mente para Chief Technology Officer cosas del cuerpo, como los latidos del corazn.  Descanse y duerma lo suficiente.  Tome los medicamentos de venta libre y los recetados solamente como se lo haya indicado el mdico.  Consulting civil engineer a todas las visitas de control como se lo haya indicado el mdico. Esto es importante. SOLICITE ATENCIN MDICA SI:  Contina con latidos cardacos rpidos o irregulares despus de 24 horas.  Las [employer] suceden con ms frecuencia.  SOLICITE ATENCIN MDICA DE INMEDIATO SI:  Siente falta de aire o dolor en el pecho.  Tiene un dolor de cabeza intenso.  Se siente mareado o se  desmaya.  Esta informacin no tiene Marine scientist el consejo del mdico. Asegrese de hacerle al mdico cualquier pregunta que tenga. Document Released: 09/19/2005 Document Revised: 04/02/2016 Document Reviewed: 08/25/2015 Elsevier Interactive Patient Education  2017 [employer].     IF you received an x-ray today, you will receive an invoice from [facility]. Please contact [facility] at [phone] with questions or concerns regarding your invoice.   IF you received labwork today, you will receive an invoice from [facility]. Please contact [facility] at [phone] with questions or concerns regarding your invoice.   Our billing staff will not be able to assist you with questions regarding bills from these companies.  You will be contacted with the lab results as soon as they are available. The fastest way to get your results is to activate your My Chart account. Instructions are located on the last page of this paperwork. If you have not heard from [place] regarding the results in 2 weeks, please contact this office.

## 2017-06-22 NOTE — Progress Notes (Signed)
  MRN: [id number] DOB: Jan 16, 1956  Subjective:   Emily [name] is a 61 y.o. female presenting for chief complaint of Neck Pain (coratid artery ); Hyperlipidemia; and Diabetes  Reports 1 week history of right neck discomfort, feels her pulse is bounding, occurs daily. Has also had palpitations. Has also had GERD. Denies fever, cough, chest pain, shob, dyspnea, n/v, dizziness, confusion, disoriented. Has a history of HTN, managed with nifedipine, metoprolol. Denies smoking cigarettes.  Emily [name] has a current medication list which includes the following prescription(s): alprazolam, esomeprazole, metoprolol succinate, nifedipine, and rosuvastatin. Also is allergic to aspirin; atorvastatin; and nsaids.  Emily [name]  has a past medical history of Abdominal pain, chronic, epigastric (11/02/2015); Arthritis; Back pain; Beta thalassemia minor; Chronic high back pain (09/24/2015); Colon polyps; GERD (gastroesophageal reflux disease); Hyperlipidemia; Hypertension; and Sickle cell trait ([place]). Also  has a past surgical history that includes Abdominal hysterectomy; Colonoscopy; Esophagogastroduodenoscopy; and Esophagogastroduodenoscopy ([name], 04/13/2016).  Objective:   Vitals: BP 101/67   Pulse 68   Temp 98.1 F (36.7 C) (Oral)   Resp 16   Ht 5\' 4"  (1.626 m)   Wt 184 lb 9.6 oz (83.7 kg)   SpO2 95%   BMI 31.69 kg/m   BP Readings from Last 3 Encounters:  06/22/17 101/67  04/29/17 93/62  04/22/17 114/74    Physical Exam  Constitutional: She is oriented to person, place, and time. She appears well-developed and well-nourished.  HENT:  Mouth/Throat: Oropharynx is clear and moist.  Eyes: No scleral icterus.  Neck: Normal range of motion. Neck supple. No JVD present. No thyromegaly present.  Cardiovascular: Normal rate, regular rhythm and intact distal pulses.  Exam reveals no gallop and no friction rub.   No murmur heard. Pulmonary/Chest: No respiratory distress. She has no wheezes. She has no rales.    Musculoskeletal: She exhibits no edema.  Lymphadenopathy:    She has no cervical adenopathy.  Neurological: She is alert and oriented to person, place, and time.  Skin: Skin is warm and dry. Capillary refill takes less than 2 seconds.  Psychiatric: She has a normal mood and affect.   Assessment and Plan :   1. Palpitations 2. Neck discomfort 3. Essential hypertension - ECG and physical exam findings reassuring. Will refer to cardiology for consult. Patient will rtc to have an annual exam with PA-McVey.  Jaynee Eagles, PA-C [facility] [phone] 06/22/2017  2:44 PM

## 2017-06-24 LAB — BRAIN NATRIURETIC PEPTIDE: BNP: 69.3 pg/mL (ref 0.0–100.0)

## 2017-06-27 ENCOUNTER — Ambulatory Visit: Payer: BLUE CROSS/BLUE SHIELD | Admitting: Emergency Medicine

## 2017-06-27 LAB — LIPID PANEL
CHOLESTEROL TOTAL: 156 mg/dL (ref 100–199)
Chol/HDL Ratio: 2.9 ratio (ref 0.0–4.4)
HDL: 53 mg/dL (ref >39)
LDL CALC: 84 mg/dL (ref 0–99)
TRIGLYCERIDES: 95 mg/dL (ref 0–149)
VLDL Cholesterol Cal: 19 mg/dL (ref 5–40)

## 2017-06-27 LAB — CBC

## 2017-07-02 ENCOUNTER — Other Ambulatory Visit: Payer: Self-pay | Admitting: Physician Assistant

## 2017-07-02 DIAGNOSIS — I10 Essential (primary) hypertension: Secondary | ICD-10-CM

## 2017-07-03 ENCOUNTER — Ambulatory Visit: Payer: BLUE CROSS/BLUE SHIELD | Admitting: Urgent Care

## 2017-07-06 ENCOUNTER — Ambulatory Visit (INDEPENDENT_AMBULATORY_CARE_PROVIDER_SITE_OTHER): Payer: BLUE CROSS/BLUE SHIELD | Admitting: Emergency Medicine

## 2017-07-06 ENCOUNTER — Other Ambulatory Visit: Payer: Self-pay | Admitting: Physician Assistant

## 2017-07-06 ENCOUNTER — Encounter: Payer: Self-pay | Admitting: Emergency Medicine

## 2017-07-06 VITALS — BP 115/75 | HR 72 | Temp 99.1°F | Resp 16 | Ht 64.0 in | Wt 185.4 lb

## 2017-07-06 DIAGNOSIS — E785 Hyperlipidemia, unspecified: Secondary | ICD-10-CM

## 2017-07-06 DIAGNOSIS — R002 Palpitations: Secondary | ICD-10-CM

## 2017-07-06 DIAGNOSIS — I10 Essential (primary) hypertension: Secondary | ICD-10-CM

## 2017-07-06 NOTE — Patient Instructions (Signed)
     IF you received an x-ray today, you will receive an invoice from [facility]. Please contact [facility] at [phone] with questions or concerns regarding your invoice.   IF you received labwork today, you will receive an invoice from [facility]. Please contact [facility] at [phone] with questions or concerns regarding your invoice.   Our billing staff will not be able to assist you with questions regarding bills from these companies.  You will be contacted with the lab results as soon as they are available. The fastest way to get your results is to activate your My Chart account. Instructions are located on the last page of this paperwork. If you have not heard from us regarding the results in 2 weeks, please contact this office.     

## 2017-07-06 NOTE — Progress Notes (Signed)
Emily [name] 61 y.o.   Chief Complaint  Patient presents with  . lab results    HISTORY OF PRESENT ILLNESS: This is a 61 y.o. female seen here 6/30 for palpitations; referred to Cardiology; had blood work done but doesn't know the results. Symptomatically better with no new symptoms.  HPI   Prior to Admission medications   Medication Sig Start Date End Date Taking? Authorizing Provider  ALPRAZolam (XANAX) 0.25 MG tablet Take 1 tablet (0.25 mg total) by mouth 3 (three) times daily as needed. for anxiety 04/22/17  Yes McVey, Gelene Mink, PA-C  esomeprazole (NEXIUM) 40 MG capsule Take 1 capsule (40 mg total) by mouth daily at 12 noon. 10/17/15  Yes Robyn Haber, MD  metoprolol succinate (TOPROL-XL) 25 MG 24 hr tablet TAKE 1 TABLET(25 MG) BY MOUTH DAILY 04/29/17  Yes McVey, Gelene Mink, PA-C  NIFEdipine (PROCARDIA-XL/ADALAT CC) 30 MG 24 hr tablet TAKE 1 TABLET (30 MG) BY MOUTH DAILY 04/29/17  Yes McVey, Gelene Mink, PA-C  rosuvastatin (CRESTOR) 20 MG tablet Take 1 tablet (20 mg total) by mouth daily. 04/29/17  Yes McVey, Gelene Mink, PA-C    Allergies  Allergen Reactions  . Aspirin Hives  . Atorvastatin     Yellow fingernails  . Nsaids Hives    Patient Active Problem List   Diagnosis Date Noted  . Microcytic red blood cells 05/17/2016  . GERD (gastroesophageal reflux disease) 04/12/2016  . Black stools 04/11/2016  . RUQ abdominal pain 11/02/2015  . Abdominal pain, chronic, epigastric 11/02/2015  . Abdominal bloating 11/02/2015  . Chronic high back pain 09/24/2015  . Anxiety 09/08/2014  . HTN (hypertension) 10/03/2012  . Hyperlipidemia 10/03/2012  . DYSPEPSIA&OTHER [facility] DISORDERS FUNCTION STOMACH 07/20/2009    Past Medical History:  Diagnosis Date  . Abdominal pain, chronic, epigastric 11/02/2015   Started amitriptyline at hospitalization 04/13/2016 EGD x 2 negative Also w/ chronic chest wall and back pain Would try not to work up further   .  Arthritis   . Back pain   . Beta thalassemia minor    diagnosed by hematology Dr. Jennette Kettle, also with sickle cell trait  . Chronic high back pain 09/24/2015  . Colon polyps   . GERD (gastroesophageal reflux disease)   . Hyperlipidemia   . Hypertension   . Sickle cell trait ([place])    diagnosed by hematology Dr. Lindi Adie, coexsisting with beta-thalassemia minor    Past Surgical History:  Procedure Laterality Date  . ABDOMINAL HYSTERECTOMY    . COLONOSCOPY    . ESOPHAGOGASTRODUODENOSCOPY    . ESOPHAGOGASTRODUODENOSCOPY [name] 04/13/2016   Procedure: ESOPHAGOGASTRODUODENOSCOPY (EGD);  Surgeon: Gatha Mayer, MD;  Location: [facility];  Service: Endoscopy;  Laterality: [name];    Social History   Social History  . Marital status: Married    Spouse name: [name]  . Number of children: 3  . Years of education: [name]   Occupational History  . Not on file.   Social History Main Topics  . Smoking status: Never Smoker  . Smokeless tobacco: Never Used  . Alcohol use No  . Drug use: No  . Sexual activity: Not on file   Other Topics Concern  . Not on file   Social History Narrative  . No narrative on file    Family History  Problem Relation Age of Onset  . Aneurysm Mother   . Diabetes Father   . Colon cancer Brother 27  . Liver cancer Brother  mets from colon  . Esophageal cancer Neg Hx   . Stomach cancer Neg Hx   . Pancreatic cancer Neg Hx      Review of Systems  Constitutional: Negative.  Negative for chills and fever.  HENT: Negative.  Negative for congestion, nosebleeds and sore throat.   Eyes: Negative.   Respiratory: Negative for cough, hemoptysis and shortness of breath.   Cardiovascular: Positive for palpitations. Negative for chest pain, claudication and leg swelling.  Gastrointestinal: Negative.  Negative for abdominal pain, nausea and vomiting.  Genitourinary: Negative for dysuria and hematuria.  Musculoskeletal: Negative for back pain, myalgias and neck  pain.  Skin: Negative.  Negative for rash.       Brittle nails  Neurological: Negative.  Negative for dizziness, sensory change, focal weakness and headaches.  Endo/Heme/Allergies: Negative.   All other systems reviewed and are negative.  Vitals:   07/06/17 1506  BP: 115/75  Pulse: 72  Resp: 16  Temp: 99.1 F (37.3 C)     Physical Exam  Constitutional: She is oriented to person, place, and time. She appears well-developed and well-nourished.  HENT:  Head: Normocephalic and atraumatic.  Mouth/Throat: Oropharynx is clear and moist.  Eyes: Pupils are equal, round, and reactive to light. Conjunctivae and EOM are normal.  Neck: Normal range of motion. Neck supple. No JVD present.  Cardiovascular: Normal rate, regular rhythm and normal heart sounds.   Pulmonary/Chest: Effort normal and breath sounds normal.  Abdominal: Soft. Bowel sounds are normal. There is no tenderness.  Musculoskeletal: Normal range of motion.  Lymphadenopathy:    She has no cervical adenopathy.  Neurological: She is alert and oriented to person, place, and time. No sensory deficit. She exhibits normal muscle tone.  Skin: Skin is warm and dry. No rash noted.  Psychiatric: She has a normal mood and affect. Her behavior is normal.  Vitals reviewed.    ASSESSMENT & PLAN: Emily [name] was seen today for lab results.  Diagnoses and all orders for this visit:  Palpitations -     CBC with Differential/Platelet -     Comprehensive metabolic panel -     Vitamin D, 25-hydroxy   Lab results d/w patient; incomplete work up. Cardiology referral was requested during last visit. Advised to return if worse or else follow up in 3-6 months.   Emily Caroli, MD [facility]

## 2017-07-08 ENCOUNTER — Other Ambulatory Visit: Payer: Self-pay | Admitting: Emergency Medicine

## 2017-07-08 DIAGNOSIS — I10 Essential (primary) hypertension: Secondary | ICD-10-CM

## 2017-07-08 MED ORDER — NIFEDIPINE ER 30 MG PO TB24: ORAL_TABLET | ORAL | 0 refills | Status: DC

## 2017-07-10 ENCOUNTER — Telehealth: Payer: Self-pay | Admitting: Emergency Medicine

## 2017-07-10 LAB — COMPREHENSIVE METABOLIC PANEL
A/G RATIO: 1.5 (ref 1.2–2.2)
ALK PHOS: 125 IU/L — AB (ref 39–117)
ALT: 22 IU/L (ref 0–32)
AST: 21 IU/L (ref 0–40)
Albumin: 4.3 g/dL (ref 3.5–5.5)
BUN/Creatinine Ratio: 13 (ref 9–23)
BUN: 12 mg/dL (ref 6–24)
Bilirubin Total: 0.2 mg/dL (ref 0.0–1.2)
CHLORIDE: 101 mmol/L (ref 96–106)
CO2: 18 mmol/L — AB (ref 20–29)
Calcium: 9.8 mg/dL (ref 8.7–10.2)
Creatinine, Ser: 0.92 mg/dL (ref 0.57–1.00)
GFR calc Af Amer: 79 mL/min/{1.73_m2} (ref >59)
GFR calc non Af Amer: 68 mL/min/{1.73_m2} (ref >59)
GLOBULIN, TOTAL: 2.9 g/dL (ref 1.5–4.5)
Glucose: 113 mg/dL — ABNORMAL HIGH (ref 65–99)
POTASSIUM: 3.7 mmol/L (ref 3.5–5.2)
SODIUM: 140 mmol/L (ref 134–144)
Total Protein: 7.2 g/dL (ref 6.0–8.5)

## 2017-07-10 LAB — CBC WITH DIFFERENTIAL/PLATELET
Basophils Absolute: 0.1 10*3/uL (ref 0.0–0.2)
Basos: 1 %
EOS (ABSOLUTE): 0.3 10*3/uL (ref 0.0–0.4)
EOS: 4 %
HEMATOCRIT: 39.7 % (ref 34.0–46.6)
Hemoglobin: 12.8 g/dL (ref 11.1–15.9)
IMMATURE GRANULOCYTES: 0 %
Immature Grans (Abs): 0 10*3/uL (ref 0.0–0.1)
LYMPHS ABS: 2.1 10*3/uL (ref 0.7–3.1)
Lymphs: 29 %
MCH: 25.5 pg — ABNORMAL LOW (ref 26.6–33.0)
MCHC: 32.2 g/dL (ref 31.5–35.7)
MCV: 79 fL (ref 79–97)
MONOS ABS: 0.5 10*3/uL (ref 0.1–0.9)
Monocytes: 7 %
NEUTROS PCT: 59 %
Neutrophils Absolute: 4.1 10*3/uL (ref 1.4–7.0)
PLATELETS: 284 10*3/uL (ref 150–379)
RBC: 5.02 x10E6/uL (ref 3.77–5.28)
RDW: 14.6 % (ref 12.3–15.4)
WBC: 7.1 10*3/uL (ref 3.4–10.8)

## 2017-07-10 LAB — VITAMIN D 25 HYDROXY (VIT D DEFICIENCY, FRACTURES): Vit D, 25-Hydroxy: 38.9 ng/mL (ref 30.0–100.0)

## 2017-07-10 NOTE — Telephone Encounter (Signed)
Pt was calling in about med refills because she forgot to mention she would need these refilled the last time she was here & now states that she doesn't have anymore pills at all. The medicines she needs are nifredipine, esomeprazole, and metoprolol   Please Advise

## 2017-07-11 ENCOUNTER — Other Ambulatory Visit: Payer: Self-pay | Admitting: *Deleted

## 2017-07-11 DIAGNOSIS — E785 Hyperlipidemia, unspecified: Secondary | ICD-10-CM

## 2017-07-12 NOTE — Telephone Encounter (Signed)
Pt called requesting refills on Nexium 40 mg #90,0 refill Metoprolol Succ XL 25 mg,#45, 0 refill, Procardia 30 mg #45, 0 refill. Sent to [facility] [phone].

## 2017-08-20 ENCOUNTER — Other Ambulatory Visit: Payer: Self-pay | Admitting: Physician Assistant

## 2017-08-20 ENCOUNTER — Telehealth: Payer: Self-pay | Admitting: Emergency Medicine

## 2017-08-20 ENCOUNTER — Other Ambulatory Visit: Payer: Self-pay | Admitting: Emergency Medicine

## 2017-08-20 DIAGNOSIS — I10 Essential (primary) hypertension: Secondary | ICD-10-CM

## 2017-08-20 DIAGNOSIS — E785 Hyperlipidemia, unspecified: Secondary | ICD-10-CM

## 2017-08-20 MED ORDER — METOPROLOL SUCCINATE ER 25 MG PO TB24: ORAL_TABLET | ORAL | 3 refills | Status: DC

## 2017-08-20 MED ORDER — NIFEDIPINE ER 30 MG PO TB24: ORAL_TABLET | ORAL | 3 refills | Status: DC

## 2017-08-20 NOTE — Telephone Encounter (Signed)
Pt was calling about her high bp medication metoprolol succinate & nifedipine she says that she needs these refilled ASAP because she only has 2 left to take.   Please advise

## 2017-08-20 NOTE — Telephone Encounter (Signed)
Per chart, patient seen 07/06/2017 and advised to follow up in 3-6 months. Refill for procardia and metoprolol sent to pharmacy at this time./ S.Humbert Morozov,CMA

## 2017-08-22 NOTE — Telephone Encounter (Signed)
Rx for Crestor has been filled. Pt advised to follow up in 3-6 months.

## 2017-09-18 ENCOUNTER — Telehealth: Payer: Self-pay | Admitting: Physician Assistant

## 2017-09-18 DIAGNOSIS — E785 Hyperlipidemia, unspecified: Secondary | ICD-10-CM

## 2017-10-08 ENCOUNTER — Telehealth: Payer: Self-pay | Admitting: Physician Assistant

## 2017-10-08 NOTE — Telephone Encounter (Signed)
PATIENT WOULD LIKE WHITNEY MCVEY TO KNOW THAT SHE NEEDS A REFILL ON HER ALPRAZOLAM (XANAX) 0.25 MG. BEST PHONE [phone] (CELL) PHARMACY CHOICE IS [facility] ON [address]. [place]

## 2017-10-09 ENCOUNTER — Other Ambulatory Visit: Payer: Self-pay | Admitting: Physician Assistant

## 2017-10-09 DIAGNOSIS — F418 Other specified anxiety disorders: Secondary | ICD-10-CM

## 2017-10-10 NOTE — Telephone Encounter (Signed)
Please advise 

## 2017-10-11 ENCOUNTER — Other Ambulatory Visit: Payer: Self-pay | Admitting: Physician Assistant

## 2017-10-12 NOTE — Telephone Encounter (Signed)
Pt said our office contacted her that her Xanax was ready. I did not see anything at [address] in the prescription box. Please advise. Pt can be contacted at [phone].

## 2017-10-13 ENCOUNTER — Other Ambulatory Visit: Payer: Self-pay | Admitting: Emergency Medicine

## 2017-10-13 DIAGNOSIS — I10 Essential (primary) hypertension: Secondary | ICD-10-CM

## 2017-10-14 NOTE — Telephone Encounter (Signed)
Rx called in to pharmacy. LVM for patient.

## 2017-10-14 NOTE — Telephone Encounter (Signed)
Approved.  

## 2017-10-14 NOTE — Telephone Encounter (Signed)
Patient requesting refill on PROCARDIA.  She was a NO SHOW for Cardiology referral with Dr Hewitt Shorts.

## 2017-10-15 ENCOUNTER — Other Ambulatory Visit: Payer: Self-pay | Admitting: *Deleted

## 2017-10-15 DIAGNOSIS — I10 Essential (primary) hypertension: Secondary | ICD-10-CM

## 2017-10-15 MED ORDER — NIFEDIPINE ER 30 MG PO TB24: 30.0000 mg | ORAL_TABLET | Freq: Every day | ORAL | 0 refills | Status: DC

## 2017-10-15 NOTE — Telephone Encounter (Signed)
Medication Nifedipine 30 mg approved by Dr Mitchel Honour and Rx sent to patient's pharmacy.

## 2017-11-01 ENCOUNTER — Ambulatory Visit: Payer: BLUE CROSS/BLUE SHIELD | Admitting: Emergency Medicine

## 2017-11-01 ENCOUNTER — Other Ambulatory Visit: Payer: Self-pay

## 2017-11-01 ENCOUNTER — Encounter: Payer: Self-pay | Admitting: Emergency Medicine

## 2017-11-01 VITALS — BP 120/70 | HR 70 | Temp 99.1°F | Resp 16 | Ht 63.5 in | Wt 185.0 lb

## 2017-11-01 DIAGNOSIS — K449 Diaphragmatic hernia without obstruction or gangrene: Secondary | ICD-10-CM

## 2017-11-01 DIAGNOSIS — K219 Gastro-esophageal reflux disease without esophagitis: Secondary | ICD-10-CM | POA: Diagnosis not present

## 2017-11-01 DIAGNOSIS — I10 Essential (primary) hypertension: Secondary | ICD-10-CM

## 2017-11-01 DIAGNOSIS — B349 Viral infection, unspecified: Secondary | ICD-10-CM

## 2017-11-01 DIAGNOSIS — R531 Weakness: Secondary | ICD-10-CM

## 2017-11-01 DIAGNOSIS — B9789 Other viral agents as the cause of diseases classified elsewhere: Secondary | ICD-10-CM | POA: Insufficient documentation

## 2017-11-01 LAB — POCT URINALYSIS DIP (MANUAL ENTRY)
BILIRUBIN UA: NEGATIVE
BILIRUBIN UA: NEGATIVE mg/dL
GLUCOSE UA: NEGATIVE mg/dL
Leukocytes, UA: NEGATIVE
Nitrite, UA: NEGATIVE
Protein Ur, POC: NEGATIVE mg/dL
SPEC GRAV UA: 1.01 (ref 1.010–1.025)
UROBILINOGEN UA: 0.2 U/dL
pH, UA: 6 (ref 5.0–8.0)

## 2017-11-01 LAB — POCT CBC
GRANULOCYTE PERCENT: 60 % (ref 37–80)
HEMATOCRIT: 37.6 % — AB (ref 37.7–47.9)
Hemoglobin: 12.3 g/dL (ref 12.2–16.2)
Lymph, poc: 2.6 (ref 0.6–3.4)
MCH: 25.2 pg — AB (ref 27–31.2)
MCHC: 32.8 g/dL (ref 31.8–35.4)
MCV: 76.7 fL — AB (ref 80–97)
MID (CBC): 0.3 (ref 0–0.9)
MPV: 7.8 fL (ref 0–99.8)
PLATELET COUNT, POC: 282 10*3/uL (ref 142–424)
POC GRANULOCYTE: 4.3 (ref 2–6.9)
POC LYMPH PERCENT: 36.4 %L (ref 10–50)
POC MID %: 3.6 % (ref 0–12)
RBC: 4.9 M/uL (ref 4.04–5.48)
RDW, POC: 14.2 %
WBC: 7.1 10*3/uL (ref 4.6–10.2)

## 2017-11-01 MED ORDER — ESOMEPRAZOLE MAGNESIUM 40 MG PO CPDR: 40.0000 mg | DELAYED_RELEASE_CAPSULE | Freq: Every day | ORAL | 3 refills | Status: DC

## 2017-11-01 MED ORDER — ROSUVASTATIN CALCIUM 20 MG PO TABS: 20.0000 mg | ORAL_TABLET | Freq: Every day | ORAL | 3 refills | Status: DC

## 2017-11-01 MED ORDER — METOPROLOL SUCCINATE ER 25 MG PO TB24: ORAL_TABLET | ORAL | 3 refills | Status: DC

## 2017-11-01 NOTE — Progress Notes (Signed)
Emily [name] 61 y.o.   Chief Complaint  Patient presents with  . Generalized Body Aches    ALL SXs x 2 weeks  . Sore Throat    HISTORY OF PRESENT ILLNESS: This is a 61 y.o. female complaining of 2 week h/o general weakness, fatigue, cough, sore throat, general body aches; gets episodes of sweating around 7am at work after spending about 10-11 hours fasting. Taking generic Rosuvastatin instead of the usual Rosuvastatin calcium and thinks that maybe some of the symptoms are due to this.   HPI   Prior to Admission medications   Medication Sig Start Date End Date Taking? Authorizing Provider  ALPRAZolam (XANAX) 0.25 MG tablet TAKE 1 TABLET BY MOUTH THREE TIMES DAILY AS NEEDED FOR ANXIETY 10/11/17  Yes McVey, Gelene Mink, PA-C  CALCIUM PO Take daily by mouth.   Yes [provider]  esomeprazole (NEXIUM) 40 MG capsule Take 1 capsule (40 mg total) by mouth daily at 12 noon. 10/17/15  Yes Robyn Haber, MD  metoprolol succinate (TOPROL-XL) 25 MG 24 hr tablet TAKE 1 TABLET(25 MG) BY MOUTH DAILY 08/20/17  Yes Ryann Leavitt, Ines Bloomer, MD  NIFEdipine (PROCARDIA-XL/ADALAT CC) 30 MG 24 hr tablet Take 1 tablet (30 mg total) by mouth daily. 10/15/17 11/14/17 Yes Gesselle Fitzsimons, Ines Bloomer, MD  rosuvastatin (CRESTOR) 20 MG tablet TAKE 1 TABLET(20 MG) BY MOUTH DAILY 09/18/17  Yes McVey, Gelene Mink, PA-C    Allergies  Allergen Reactions  . Aspirin Hives  . Atorvastatin     Yellow fingernails  . Nsaids Hives    Patient Active Problem List   Diagnosis Date Noted  . Microcytic red blood cells 05/17/2016  . GERD (gastroesophageal reflux disease) 04/12/2016  . Black stools 04/11/2016  . RUQ abdominal pain 11/02/2015  . Abdominal pain, chronic, epigastric 11/02/2015  . Abdominal bloating 11/02/2015  . Chronic high back pain 09/24/2015  . Anxiety 09/08/2014  . HTN (hypertension) 10/03/2012  . Hyperlipidemia 10/03/2012  . DYSPEPSIA&OTHER [facility] DISORDERS FUNCTION STOMACH  07/20/2009    Past Medical History:  Diagnosis Date  . Abdominal pain, chronic, epigastric 11/02/2015   Started amitriptyline at hospitalization 04/13/2016 EGD x 2 negative Also w/ chronic chest wall and back pain Would try not to work up further   . Arthritis   . Back pain   . Beta thalassemia minor    diagnosed by hematology Dr. Jennette Kettle, also with sickle cell trait  . Chronic high back pain 09/24/2015  . Colon polyps   . GERD (gastroesophageal reflux disease)   . Hyperlipidemia   . Hypertension   . Sickle cell trait ([place])    diagnosed by hematology Dr. Lindi Adie, coexsisting with beta-thalassemia minor    Past Surgical History:  Procedure Laterality Date  . ABDOMINAL HYSTERECTOMY    . COLONOSCOPY    . ESOPHAGOGASTRODUODENOSCOPY      Social History   Socioeconomic History  . Marital status: Married    Spouse name: Not on file  . Number of children: 3  . Years of education: Not on file  . Highest education level: Not on file  Social Needs  . Financial resource strain: Not on file  . Food insecurity - worry: Not on file  . Food insecurity - inability: Not on file  . Transportation needs - medical: Not on file  . Transportation needs - non-medical: Not on file  Occupational History  . Not on file  Tobacco Use  . Smoking status: Never Smoker  . Smokeless tobacco: Never Used  Substance and Sexual Activity  . Alcohol use: No    Alcohol/week: 0.0 oz  . Drug use: No  . Sexual activity: Not on file  Other Topics Concern  . Not on file  Social History Narrative  . Not on file    Family History  Problem Relation Age of Onset  . Aneurysm Mother   . Diabetes Father   . Colon cancer Brother 66  . Liver cancer Brother        mets from colon  . Esophageal cancer Neg Hx   . Stomach cancer Neg Hx   . Pancreatic cancer Neg Hx      Review of Systems  Constitutional: Positive for malaise/fatigue. Negative for chills and fever.  HENT: Positive for sore throat.   Eyes:  Negative.   Respiratory: Positive for cough. Negative for hemoptysis and shortness of breath.   Cardiovascular: Negative.  Negative for chest pain, palpitations and leg swelling.  Gastrointestinal: Negative for abdominal pain, diarrhea, heartburn, nausea and vomiting.  Genitourinary: Negative.  Negative for dysuria and hematuria.  Musculoskeletal: Negative.  Negative for back pain, myalgias and neck pain.  Skin: Negative.  Negative for rash.  Neurological: Positive for weakness. Negative for dizziness and headaches.  All other systems reviewed and are negative.   Vitals:   11/01/17 1547  BP: 120/70  Pulse: 70  Resp: 16  Temp: 99.1 F (37.3 C)  SpO2: 97%    Physical Exam  Constitutional: She is oriented to person, place, and time. She appears well-developed and well-nourished.  HENT:  Head: Normocephalic and atraumatic.  Right Ear: External ear normal.  Left Ear: External ear normal.  Nose: Nose normal.  Mouth/Throat: Oropharynx is clear and moist.  Eyes: Conjunctivae and EOM are normal. Pupils are equal, round, and reactive to light.  Neck: Normal range of motion. Neck supple. No JVD present. No thyromegaly present.  Cardiovascular: Normal rate, regular rhythm, normal heart sounds and intact distal pulses.  Pulmonary/Chest: Effort normal and breath sounds normal.  Abdominal: Soft. Bowel sounds are normal. She exhibits no distension. There is no tenderness.  Musculoskeletal: Normal range of motion.  Lymphadenopathy:    She has no cervical adenopathy.  Neurological: She is alert and oriented to person, place, and time. No sensory deficit. She exhibits normal muscle tone.  Skin: Skin is warm and dry. Capillary refill takes less than 2 seconds. No rash noted.  Psychiatric: She has a normal mood and affect. Her behavior is normal.  Vitals reviewed.    ASSESSMENT & PLAN: Urania was seen today for generalized body aches and sore throat.  Diagnoses and all orders for this  visit:  General weakness -     POCT CBC -     POCT urinalysis dipstick -     Comprehensive metabolic panel -     CK  Viral illness  Hiatal hernia -     esomeprazole (NEXIUM) 40 MG capsule; Take 1 capsule (40 mg total) daily at 12 noon by mouth.  Gastroesophageal reflux disease, esophagitis presence not specified -     esomeprazole (NEXIUM) 40 MG capsule; Take 1 capsule (40 mg total) daily at 12 noon by mouth.  Essential hypertension -     metoprolol succinate (TOPROL-XL) 25 MG 24 hr tablet; TAKE 1 TABLET(25 MG) BY MOUTH DAILY  Other orders -     rosuvastatin (CRESTOR) 20 MG tablet; Take 1 tablet (20 mg total) daily by mouth.     Patient Instructions   We recommend that  you schedule a mammogram for breast cancer screening. Typically, you do not need a referral to do this. Please contact a local imaging center to schedule your mammogram.  [facility] - [phone]  *ask for the Radiology Department [facility] ([place]) - [phone] or [phone]  [facility] - [phone] [place] [phone] [facility] - [phone]  *ask for the [facility] - [phone]  *ask for the Radiology Department [facility] - [phone]  *ask for the [place] - [phone]    IF you received an x-ray today, you will receive an invoice from [facility] Radiology. Please contact [facility] Radiology at [phone] with questions or concerns regarding your invoice.   IF you received labwork today, you will receive an invoice from [place]. Please contact [facility] at [phone] with questions or concerns regarding your invoice.   Our billing staff will not be able to assist you with questions regarding bills from these companies.  You will be contacted with the lab results as soon as they are available. The fastest way to get  your results is to activate your My Chart account. Instructions are located on the last page of this paperwork. If you have not heard from [place] regarding the results in 2 weeks, please contact this office.     Enfermedades virales en los adultos (Viral Illness, Adult) Los virus son microbios diminutos que entran en el organismo de Ardelia Mems persona y Emergency planning/management officer. Hay muchos tipos de virus diferentes y causan muchas clases de enfermedades. Las enfermedades virales pueden ser leves o graves. Pueden afectar diferentes partes del cuerpo. Las enfermedades frecuentes causadas por virus incluyen los resfros y Counsellor. Adems, las enfermedades virales abarcan cuadros clnicos graves, como el VIH/sida (virus de inmunodeficiencia humana/sndrome de inmunodeficiencia adquirida). Se han identificado unos pocos virus asociados con determinados tipos de cncer. CULES SON LAS CAUSAS? Muchos tipos de virus pueden causar enfermedades. Los virus invaden las clulas del organismo, se multiplican y Licensed conveyancer la disfuncin o la muerte de las clulas infectadas. Cuando la clula muere, libera ms virus. Cuando esto ocurre, aparecen sntomas de la enfermedad, y el virus sigue diseminndose a otras clulas. Si el virus asume la funcin de la clula, puede hacer que esta se divida y crezca fuera de control, y este es el caso en el que un virus causa cncer. Los diferentes virus ingresan al organismo de [employer]. Puede contraer un virus de la siguiente [place]:  Al ingerir alimentos o beber agua contaminados con el virus.  Al inhalar gotitas que una persona infectada liber en el aire al toser o estornudar.  Al tocar una superficie contaminada con el virus y [employer] mano a la boca, la nariz o los ojos.  Al ser picado por un insecto o mordido por un animal que son portadores del virus.  Al tener contacto sexual con Ardelia Mems persona infectada por el virus.  Al tener contacto con sangre o lquidos que  contienen el virus, ya sea a travs de un corte abierto o durante una transfusin. Si el virus ingresa al organismo, el sistema de defensa del cuerpo (sistema inmunitario) Hydrographic surveyor. Puede correr un riesgo ms alto de tener una enfermedad viral si tiene el sistema inmunitario debilitado. [place]? Los sntomas varan en funcin del tipo de virus y de la ubicacin de las clulas que este invade. Los sntomas  frecuentes de los principales tipos de enfermedades virales incluyen los siguientes: Virus del resfro y de la gripe  [place].  Dolor de [place].  Dolor de Investment banker, operational.  Dolores musculares.  Congestin nasal.  Tos. Virus del aparato digestivo (gastrointestinales)  Cristy Hilts.  Dolor abdominal.  Nuseas.  Diarrea. Virus hepticos (hepatitis)  Prdida del apetito.  Cansancio.  Tono amarillento de la piel (ictericia). Virus del cerebro y la mdula espinal  [place].  Dolor de [place].  Rigidez en el cuello.  Nuseas y vmitos.  Confusin o somnolencia. Virus de la piel  [place]rug[place].  Picazn.  Erupcin cutnea. Virus de transmisin sexual  Secrecin.  Hinchazn.  Enrojecimiento.  Erupcin cutnea. CMO SE TRATA ESTA AFECCIN? Los virus pueden ser difciles de tratar porque se hospedan en las clulas. Los antibiticos no tratan los virus porque no llegan al interior de las clulas. El tratamiento de una enfermedad viral puede incluir lo siguiente:  Production assistant, radio y beber abundantes lquidos.  Medicamentos para [employer]. Estos pueden incluir medicamentos de venta libre para Conservation officer, historic buildings y la [place], medicamentos para la tos o la congestin y medicamentos para Holiday representative.  Medicamentos antivirales. Estos frmacos estn disponibles nicamente para determinados tipos de virus. Pueden ayudar a [employer] de la gripe, si se toman apenas comienza el cuadro. Tambin hay antivirales para la hepatitis y el  VIH/sida. Algunas enfermedades virales pueden evitarse con vacunas. Un ejemplo frecuente es la vacuna antigripal. SIGA ESTAS INDICACIONES EN SU CASA: [place] los medicamentos de venta libre y los recetados solamente como se lo haya indicado el mdico.  Si le recetaron un antiviral, tmelo como se lo haya indicado el mdico. No deje de tomar los medicamentos aunque comience a Sports administrator.  Infrmese sobre cundo los antibiticos son necesarios y cundo no. Los antibiticos no combaten a los virus. Si el mdico cree que usted puede tener una infeccin bacteriana as como una viral, tal vez le receten un antibitico. ? No solicite una receta de antibiticos si le han diagnosticado una enfermedad viral. Eso no har que la enfermedad pase ms rpidamente. ? Tomar antibiticos con frecuencia cuando no son necesarios puede derivar en resistencia a los antibiticos. Cuando esto ocurre, el medicamento pierde su eficacia contra las bacterias que normalmente combate. Instrucciones generales  Beba suficiente lquido para mantener la orina clara o de color amarillo plido.  Descanse todo lo que pueda.  Retome sus actividades normales como se lo haya indicado el mdico. Pregntele al mdico qu actividades son seguras para usted.  Concurra a todas las visitas de control como se lo haya indicado el mdico. Esto es importante. CMO SE EVITA ESTO? Tome estas medidas para reducir el riesgo de tener una infeccin viral:  Consuma una dieta sana y descanse mucho.  Lvese las manos frecuentemente con agua y [place]. Esto es especialmente importante cuando est en lugares pblicos. Use desinfectante para manos si no dispone de [place] y [place].  Evite el contacto cercano con amigos y familiares que tengan una infeccin viral.  Si viaja a las regiones donde las infecciones virales gastrointestinales son frecuentes, no tome agua ni coma alimentos crudos.  [place]. Vacnese  contra la gripe todos los aos como se lo haya indicado el mdico.  No comparta cepillos de dientes, cortaas, rasuradoras o agujas con [employer].  Siempre tenga sexo con proteccin. COMUNQUESE CON UN MDICO SI:  Tiene sntomas de una enfermedad viral que no desaparecen.  Los sntomas regresan despus  de haber desaparecido.  Los sntomas empeoran. SOLICITE AYUDA DE INMEDIATO SI:  Tiene dificultad para respirar.  Siente un dolor de cabeza intenso o rigidez en el cuello.  Tiene vmitos fuertes o dolor abdominal. Esta informacin no tiene Marine scientist el consejo del mdico. Asegrese de hacerle al mdico cualquier pregunta que tenga. Document Released: 04/20/2016 Document Revised: 04/20/2016 Document Reviewed: 04/20/2016 Elsevier Interactive Patient Education  2018 [employer].     Agustina Caroli, MD [facility]

## 2017-11-01 NOTE — Patient Instructions (Addendum)
We recommend that you schedule a mammogram for breast cancer screening. Typically, you do not need a referral to do this. Please contact a local imaging center to schedule your mammogram.  [facility] - [phone]  *ask for the Radiology Department [facility] ([place]) - [phone] or [phone]  [facility] - [phone] [place] [phone] [facility] - [phone]  *ask for the [facility] - [phone]  *ask for the Radiology Department [facility] - [phone]  *ask for the [place] - [phone]    IF you received an x-ray today, you will receive an invoice from [facility] Radiology. Please contact [facility] Radiology at [phone] with questions or concerns regarding your invoice.   IF you received labwork today, you will receive an invoice from [place]. Please contact [facility] at [phone] with questions or concerns regarding your invoice.   Our billing staff will not be able to assist you with questions regarding bills from these companies.  You will be contacted with the lab results as soon as they are available. The fastest way to get your results is to activate your My Chart account. Instructions are located on the last page of this paperwork. If you have not heard from [place] regarding the results in 2 weeks, please contact this office.     Enfermedades virales en los adultos (Viral Illness, Adult) Los virus son microbios diminutos que entran en el organismo de Ardelia Mems persona y Emergency planning/management officer. Hay muchos tipos de virus diferentes y causan muchas clases de enfermedades. Las enfermedades virales pueden ser leves o graves. Pueden afectar diferentes partes del cuerpo. Las enfermedades frecuentes causadas por virus incluyen los resfros y Counsellor. Adems, las enfermedades virales abarcan cuadros clnicos  graves, como el VIH/sida (virus de inmunodeficiencia humana/sndrome de inmunodeficiencia adquirida). Se han identificado unos pocos virus asociados con determinados tipos de cncer. CULES SON LAS CAUSAS? Muchos tipos de virus pueden causar enfermedades. Los virus invaden las clulas del organismo, se multiplican y Licensed conveyancer la disfuncin o la muerte de las clulas infectadas. Cuando la clula muere, libera ms virus. Cuando esto ocurre, aparecen sntomas de la enfermedad, y el virus sigue diseminndose a otras clulas. Si el virus asume la funcin de la clula, puede hacer que esta se divida y crezca fuera de control, y este es el caso en el que un virus causa cncer. Los diferentes virus ingresan al organismo de [employer]. Puede contraer un virus de la siguiente [place]:  Al ingerir alimentos o beber agua contaminados con el virus.  Al inhalar gotitas que una persona infectada liber en el aire al toser o estornudar.  Al tocar una superficie contaminada con el virus y [employer] mano a la boca, la nariz o los ojos.  Al ser picado por un insecto o mordido por un animal que son portadores del virus.  Al tener contacto sexual con Ardelia Mems persona infectada por el virus.  Al tener contacto con sangre o lquidos que contienen el virus, ya sea a travs de un corte abierto o durante una transfusin. Si el virus ingresa al organismo, el sistema de defensa del cuerpo (sistema inmunitario) Hydrographic surveyor. Puede correr un riesgo ms alto de tener una enfermedad viral si tiene el sistema inmunitario debilitado. [place]? Los sntomas varan en funcin del tipo de virus y de la ubicacin de las clulas que este invade.  Los sntomas frecuentes de los principales tipos de enfermedades virales incluyen los siguientes: Virus del resfro y de la gripe  [place].  Dolor de [place].  Dolor de Investment banker, operational.  Dolores musculares.  Congestin nasal.  Tos. Virus del  aparato digestivo (gastrointestinales)  Cristy Hilts.  Dolor abdominal.  Nuseas.  Diarrea. Virus hepticos (hepatitis)  Prdida del apetito.  Cansancio.  Tono amarillento de la piel (ictericia). Virus del cerebro y la mdula espinal  [place].  Dolor de [place].  Rigidez en el cuello.  Nuseas y vmitos.  Confusin o somnolencia. Virus de la piel  [place].  Picazn.  Erupcin cutnea. Virus de transmisin sexual  Secrecin.  Hinchazn.  Enrojecimiento.  Erupcin cutnea. CMO SE TRATA ESTA AFECCIN? Los virus pueden ser difciles de tratar porque se hospedan en las clulas. Los antibiticos no tratan los virus porque no llegan al interior de las clulas. El tratamiento de una enfermedad viral puede incluir lo siguiente:  Production assistant, radio y beber abundantes lquidos.  Medicamentos para [employer]. Estos pueden incluir medicamentos de venta libre para Conservation officer, historic buildings y la [place], medicamentos para la tos o la congestin y medicamentos para Holiday representative.  Medicamentos antivirales. Estos frmacos estn disponibles nicamente para determinados tipos de virus. Pueden ayudar a [employer] de la gripe, si se toman apenas comienza el cuadro. Tambin hay antivirales para la hepatitis y el VIH/sida. Algunas enfermedades virales pueden evitarse con vacunas. Un ejemplo frecuente es la vacuna antigripal. SIGA ESTAS INDICACIONES EN SU CASA: [place] los medicamentos de venta libre y los recetados solamente como se lo haya indicado el mdico.  Si le recetaron un antiviral, tmelo como se lo haya indicado el mdico. No deje de tomar los medicamentos aunque comience a Sports administrator.  Infrmese sobre cundo los antibiticos son necesarios y cundo no. Los antibiticos no combaten a los virus. Si el mdico cree que usted puede tener una infeccin bacteriana as como una viral, tal vez le receten un antibitico. ? No solicite una receta de antibiticos si le han  diagnosticado una enfermedad viral. Eso no har que la enfermedad pase ms rpidamente. ? Tomar antibiticos con frecuencia cuando no son necesarios puede derivar en resistencia a los antibiticos. Cuando esto ocurre, el medicamento pierde su eficacia contra las bacterias que normalmente combate. Instrucciones generales  Beba suficiente lquido para mantener la orina clara o de color amarillo plido.  Descanse todo lo que pueda.  Retome sus actividades normales como se lo haya indicado el mdico. Pregntele al mdico qu actividades son seguras para usted.  Concurra a todas las visitas de control como se lo haya indicado el mdico. Esto es importante. CMO SE EVITA ESTO? Tome estas medidas para reducir el riesgo de tener una infeccin viral:  Consuma una dieta sana y descanse mucho.  Lvese las manos frecuentemente con agua y [place]. Esto es especialmente importante cuando est en lugares pblicos. Use desinfectante para manos si no dispone de [place] y [place].  Evite el contacto cercano con amigos y familiares que tengan una infeccin viral.  Si viaja a las regiones donde las infecciones virales gastrointestinales son frecuentes, no tome agua ni coma alimentos crudos.  [place]. Vacnese contra la gripe todos los aos como se lo haya indicado el mdico.  No comparta cepillos de dientes, cortaas, rasuradoras o agujas con [employer].  Siempre tenga sexo con proteccin. COMUNQUESE CON UN MDICO SI:  Tiene sntomas de una enfermedad viral que no desaparecen.  Los sntomas  regresan despus de haber desaparecido.  Los sntomas empeoran. SOLICITE AYUDA DE INMEDIATO SI:  Tiene dificultad para respirar.  Siente un dolor de cabeza intenso o rigidez en el cuello.  Tiene vmitos fuertes o dolor abdominal. Esta informacin no tiene Marine scientist el consejo del mdico. Asegrese de hacerle al mdico cualquier pregunta que tenga. Document Released:  04/20/2016 Document Revised: 04/20/2016 Document Reviewed: 04/20/2016 Elsevier Interactive Patient Education  [employer].

## 2017-11-02 LAB — COMPREHENSIVE METABOLIC PANEL
ALBUMIN: 4.1 g/dL (ref 3.6–4.8)
ALK PHOS: 135 IU/L — AB (ref 39–117)
ALT: 16 IU/L (ref 0–32)
AST: 21 IU/L (ref 0–40)
Albumin/Globulin Ratio: 1.2 (ref 1.2–2.2)
BUN / CREAT RATIO: 21 (ref 12–28)
BUN: 14 mg/dL (ref 8–27)
CHLORIDE: 102 mmol/L (ref 96–106)
CO2: 24 mmol/L (ref 20–29)
CREATININE: 0.67 mg/dL (ref 0.57–1.00)
Calcium: 10.1 mg/dL (ref 8.7–10.3)
GFR calc Af Amer: 110 mL/min/{1.73_m2} (ref >59)
GFR calc non Af Amer: 96 mL/min/{1.73_m2} (ref >59)
GLUCOSE: 84 mg/dL (ref 65–99)
Globulin, Total: 3.5 g/dL (ref 1.5–4.5)
Potassium: 4 mmol/L (ref 3.5–5.2)
Sodium: 140 mmol/L (ref 134–144)
Total Protein: 7.6 g/dL (ref 6.0–8.5)

## 2017-11-02 LAB — CK: Total CK: 115 U/L (ref 24–173)

## 2017-11-20 ENCOUNTER — Telehealth: Payer: Self-pay | Admitting: Emergency Medicine

## 2017-11-20 ENCOUNTER — Other Ambulatory Visit: Payer: Self-pay | Admitting: Emergency Medicine

## 2017-11-20 DIAGNOSIS — R002 Palpitations: Secondary | ICD-10-CM

## 2017-11-20 NOTE — Telephone Encounter (Signed)
Was supposed to be referred by [facility] during 6/30 visit. Anyway, I took care of it. Referral sent. Thanks.

## 2017-11-20 NOTE — Telephone Encounter (Signed)
Okay thank you will get that referral sent out.. Thanks

## 2017-11-20 NOTE — Telephone Encounter (Signed)
Doesn't look like pt has a referral in for cardiology.. If you would please place referral and we will work on that thanks  Please advise

## 2017-11-20 NOTE — Telephone Encounter (Signed)
Copied from [place] [id number]. Topic: Referral - Status >> Nov 20, 2017  3:30 PM Robina Ade, Helene Kelp D wrote: Reason for CRM: Patient called asking about her referral to cardiology that Dr. Mitchel Honour told her he would schedule. Please call patient back, thanks. She can be reached at [phone].

## 2017-11-21 ENCOUNTER — Ambulatory Visit: Payer: BLUE CROSS/BLUE SHIELD | Admitting: Emergency Medicine

## 2017-11-21 ENCOUNTER — Encounter: Payer: Self-pay | Admitting: Emergency Medicine

## 2017-11-21 ENCOUNTER — Emergency Department (HOSPITAL_COMMUNITY)
Admission: EM | Admit: 2017-11-21 | Discharge: 2017-11-22 | Disposition: A | Discharge status: Home / Self Care | Payer: BLUE CROSS/BLUE SHIELD | Attending: Emergency Medicine | Admitting: Emergency Medicine

## 2017-11-21 ENCOUNTER — Other Ambulatory Visit: Payer: Self-pay

## 2017-11-21 ENCOUNTER — Encounter (HOSPITAL_COMMUNITY): Payer: Self-pay | Admitting: Emergency Medicine

## 2017-11-21 ENCOUNTER — Emergency Department (HOSPITAL_COMMUNITY): Payer: BLUE CROSS/BLUE SHIELD

## 2017-11-21 VITALS — BP 104/64 | HR 59 | Temp 98.6°F | Resp 16 | Ht 64.0 in | Wt 183.6 lb

## 2017-11-21 DIAGNOSIS — R1011 Right upper quadrant pain: Secondary | ICD-10-CM | POA: Diagnosis not present

## 2017-11-21 DIAGNOSIS — R1031 Right lower quadrant pain: Secondary | ICD-10-CM | POA: Diagnosis present

## 2017-11-21 DIAGNOSIS — R10811 Right upper quadrant abdominal tenderness: Secondary | ICD-10-CM | POA: Diagnosis not present

## 2017-11-21 DIAGNOSIS — R1084 Generalized abdominal pain: Secondary | ICD-10-CM | POA: Diagnosis not present

## 2017-11-21 DIAGNOSIS — I10 Essential (primary) hypertension: Secondary | ICD-10-CM | POA: Insufficient documentation

## 2017-11-21 DIAGNOSIS — Z79899 Other long term (current) drug therapy: Secondary | ICD-10-CM | POA: Diagnosis not present

## 2017-11-21 DIAGNOSIS — R10813 Right lower quadrant abdominal tenderness: Secondary | ICD-10-CM | POA: Diagnosis not present

## 2017-11-21 LAB — LIPASE, BLOOD: LIPASE: 25 U/L (ref 11–51)

## 2017-11-21 LAB — COMPREHENSIVE METABOLIC PANEL
ALT: 22 U/L (ref 14–54)
ANION GAP: 7 (ref 5–15)
AST: 21 U/L (ref 15–41)
Albumin: 4.2 g/dL (ref 3.5–5.0)
Alkaline Phosphatase: 124 U/L (ref 38–126)
BUN: 16 mg/dL (ref 6–20)
CALCIUM: 9.8 mg/dL (ref 8.9–10.3)
CHLORIDE: 103 mmol/L (ref 101–111)
CO2: 27 mmol/L (ref 22–32)
CREATININE: 0.73 mg/dL (ref 0.44–1.00)
Glucose, Bld: 90 mg/dL (ref 65–99)
Potassium: 3.5 mmol/L (ref 3.5–5.1)
Sodium: 137 mmol/L (ref 135–145)
Total Bilirubin: 0.4 mg/dL (ref 0.3–1.2)
Total Protein: 8 g/dL (ref 6.5–8.1)

## 2017-11-21 LAB — CBC
HCT: 37.7 % (ref 36.0–46.0)
Hemoglobin: 12.8 g/dL (ref 12.0–15.0)
MCH: 25.9 pg — ABNORMAL LOW (ref 26.0–34.0)
MCHC: 34 g/dL (ref 30.0–36.0)
MCV: 76.2 fL — AB (ref 78.0–100.0)
PLATELETS: 294 10*3/uL (ref 150–400)
RBC: 4.95 MIL/uL (ref 3.87–5.11)
RDW: 14.7 % (ref 11.5–15.5)
WBC: 8.8 10*3/uL (ref 4.0–10.5)

## 2017-11-21 LAB — I-STAT BETA HCG BLOOD, ED (MC, WL, AP ONLY)

## 2017-11-21 NOTE — ED Provider Notes (Signed)
[facility] DEPT Provider Note   CSN: [id number] Arrival date & time: 11/21/17  1805     History   Chief Complaint Chief Complaint  Patient presents with  . Abdominal Pain    HPI Emily [name] is a 61 y.o. female.  The history is provided by the patient and medical records.    61 year old female with history of chronic epigastric abdominal pain, arthritis, sickle cell trait, hypertension, hyperlipidemia, GERD, presenting to the ED with abdominal pain.  States pain is been ongoing for about 3 days, steadily worsening.  She reports associated nausea and poor appetite.  No vomiting.  No diarrhea.  States she has not been urinating very much, but thinks is because she is not drinking very much either.  Was seen at urgent care today and had a urine test which she was told was normal.  She was sent here for further evaluation.  Patient points to her right lower quadrant as source of pain, does endorse some radiation up to right upper abdomen as well.  She has not tried any medications prior to arrival.  Prior abdominal surgeries include hysterectomy.  Past Medical History:  Diagnosis Date  . Abdominal pain, chronic, epigastric 11/02/2015   Started amitriptyline at hospitalization 04/13/2016 EGD x 2 negative Also w/ chronic chest wall and back pain Would try not to work up further   . Arthritis   . Back pain   . Beta thalassemia minor    diagnosed by hematology Dr. Jennette Kettle, also with sickle cell trait  . Chronic high back pain 09/24/2015  . Colon polyps   . GERD (gastroesophageal reflux disease)   . Hyperlipidemia   . Hypertension   . Sickle cell trait ([place])    diagnosed by hematology Dr. Lindi Adie, coexsisting with beta-thalassemia minor    Patient Active Problem List   Diagnosis Date Noted  . Generalized abdominal pain 11/21/2017  . Right lower quadrant abdominal tenderness without rebound tenderness 11/21/2017  . General weakness 11/01/2017    . Viral illness 11/01/2017  . Microcytic red blood cells 05/17/2016  . GERD (gastroesophageal reflux disease) 04/12/2016  . Black stools 04/11/2016  . Right upper quadrant abdominal tenderness without rebound tenderness 11/02/2015  . Abdominal pain, chronic, epigastric 11/02/2015  . Abdominal bloating 11/02/2015  . Chronic high back pain 09/24/2015  . Anxiety 09/08/2014  . HTN (hypertension) 10/03/2012  . Hyperlipidemia 10/03/2012  . DYSPEPSIA&OTHER [facility] DISORDERS FUNCTION STOMACH 07/20/2009    Past Surgical History:  Procedure Laterality Date  . ABDOMINAL HYSTERECTOMY    . COLONOSCOPY    . ESOPHAGOGASTRODUODENOSCOPY    . ESOPHAGOGASTRODUODENOSCOPY [name] 04/13/2016   Procedure: ESOPHAGOGASTRODUODENOSCOPY (EGD);  Surgeon: Gatha Mayer, MD;  Location: [facility];  Service: Endoscopy;  Laterality: [name];    OB History    No data available       Home Medications    Prior to Admission medications   Medication Sig Start Date End Date Taking? Authorizing Provider  ALPRAZolam (XANAX) 0.25 MG tablet TAKE 1 TABLET BY MOUTH THREE TIMES DAILY AS NEEDED FOR ANXIETY 10/11/17  Yes McVey, Gelene Mink, PA-C  CALCIUM PO Take 1 tablet by mouth daily.    Yes [provider]  esomeprazole (NEXIUM) 40 MG capsule Take 1 capsule (40 mg total) daily at 12 noon by mouth. 11/01/17  Yes Sagardia, Ines Bloomer, MD  metoprolol succinate (TOPROL-XL) 25 MG 24 hr tablet TAKE 1 TABLET(25 MG) BY MOUTH DAILY 11/01/17  Yes Sagardia, Ines Bloomer, MD  NIFEdipine (PROCARDIA-XL/ADALAT CC) 30 MG 24 hr tablet Take 1 tablet (30 mg total) by mouth daily. 10/15/17 11/21/17 Yes Sagardia, Ines Bloomer, MD  rosuvastatin (CRESTOR) 20 MG tablet Take 1 tablet (20 mg total) daily by mouth. 11/01/17  Yes SagardiaInes Bloomer, MD    Family History Family History  Problem Relation Age of Onset  . Aneurysm Mother   . Diabetes Father   . Colon cancer Brother 36  . Liver cancer Brother        mets from colon  .  Esophageal cancer Neg Hx   . Stomach cancer Neg Hx   . Pancreatic cancer Neg Hx     Social History Social History   Tobacco Use  . Smoking status: Never Smoker  . Smokeless tobacco: Never Used  Substance Use Topics  . Alcohol use: No    Alcohol/week: 0.0 oz  . Drug use: No     Allergies   Aspirin; Atorvastatin; and Nsaids   Review of Systems Review of Systems  Constitutional: Positive for appetite change.  Gastrointestinal: Positive for abdominal pain.  All other systems reviewed and are negative.    Physical Exam Updated Vital Signs BP 129/89 (BP Location: Left Arm)   Pulse (!) 55   Temp 97.8 F (36.6 C) (Oral)   Resp 18   SpO2 98%   Physical Exam  Constitutional: She is oriented to person, place, and time. She appears well-developed and well-nourished.  HENT:  Head: Normocephalic and atraumatic.  Mouth/Throat: Oropharynx is clear and moist.  Eyes: Conjunctivae and EOM are normal. Pupils are equal, round, and reactive to light.  Neck: Normal range of motion.  Cardiovascular: Normal rate, regular rhythm and normal heart sounds.  Pulmonary/Chest: Effort normal and breath sounds normal.  Abdominal: Soft. Bowel sounds are normal. There is tenderness in the right lower quadrant and suprapubic area.    Pain suprapubic and RLQ areas, endorses radiation up to RUQ but no focal tenderness of this area; no distention, bowel sounds normal  Musculoskeletal: Normal range of motion.  Neurological: She is alert and oriented to person, place, and time.  Skin: Skin is warm and dry.  Psychiatric: She has a normal mood and affect.  Nursing note and vitals reviewed.    ED Treatments / Results  Labs (all labs ordered are listed, but only abnormal results are displayed) Labs Reviewed  CBC - Abnormal; Notable for the following components:      Result Value   MCV 76.2 (*)    MCH 25.9 (*)    All other components within normal limits  URINALYSIS, ROUTINE W REFLEX  MICROSCOPIC - Abnormal; Notable for the following components:   Color, Urine STRAW (*)    Leukocytes, UA TRACE (*)    Squamous Epithelial / LPF 0-5 (*)    All other components within normal limits  LIPASE, BLOOD  COMPREHENSIVE METABOLIC PANEL  I-STAT BETA HCG BLOOD, ED ([facility], [facility], [facility] ONLY)    EKG  EKG Interpretation None       Radiology Ct Abdomen Pelvis W Contrast  Result Date: 11/22/2017 CLINICAL DATA:  Right lower quadrant pain for 2 days, nausea EXAM: CT ABDOMEN AND PELVIS WITH CONTRAST TECHNIQUE: Multidetector CT imaging of the abdomen and pelvis was performed using the standard protocol following bolus administration of intravenous contrast. CONTRAST:  100 mL Isovue-300 intravenous COMPARISON:  08/07/2016 FINDINGS: Lower chest: Lung bases demonstrate dependent atelectasis. No consolidation or effusion. Borderline to mild cardiomegaly. Hiatal hernia. Hepatobiliary: Stable subcentimeter hypodensity in the  central liver. No calcified gallstones or biliary dilatation Pancreas: Unremarkable. No pancreatic ductal dilatation or surrounding inflammatory changes. Spleen: Normal in size without focal abnormality. Adrenals/Urinary Tract: Adrenal glands are unremarkable. Kidneys are normal, without renal calculi, focal lesion, or hydronephrosis. Bladder is unremarkable. Stomach/Bowel: Stomach is within normal limits. Appendix appears normal. No evidence of bowel wall thickening, distention, or inflammatory changes. Vascular/Lymphatic: No significant vascular findings are present. No enlarged abdominal or pelvic lymph nodes. Reproductive: Status post hysterectomy. No adnexal masses. Other: No abdominal wall hernia or abnormality. No abdominopelvic ascites. Musculoskeletal: No acute or significant osseous findings. IMPRESSION: No CT evidence for acute intra-abdominal or pelvic abnormality. Negative for appendicitis. Electronically Signed   By: Donavan Foil M.D.   On: 11/22/2017 00:55     Procedures Procedures (including critical care time)  Medications Ordered in ED Medications - No data to display   Initial Impression / Assessment and Plan / ED Course  I have reviewed the triage vital signs and the nursing notes.  Pertinent labs & imaging results that were available during my care of the patient were reviewed by me and considered in my medical decision making (see chart for details).  61 year old female here with lower abdominal pain.  Has been ongoing for 3 days now.  Seen at urgent care and sent here for further evaluation.  She is afebrile and nontoxic.  Tenderness in the right lower quadrant and endorses radiation to right upper quadrant.  Negative Murphy sign.  Screening lab work obtained from triage overall reassuring.  UA with trace leuks, no bacteria.  Given the nature of her symptoms, CT scan was obtained which is negative for any acute findings.  On chart review, it appears patient was seen last year for similar symptoms without known cause.  Husband reports she has seen GI in the past but it has been a while.  I recommended that she follow-up with them if any ongoing issues.  Discussed plan with patient and husband, they both acknowledged understanding and agreed with plan of care.  Return precautions given for new or worsening symptoms.  Final Clinical Impressions(s) / ED Diagnoses   Final diagnoses:  Right lower quadrant abdominal pain    ED Discharge Orders        Ordered    traMADol (ULTRAM) 50 MG tablet  Every 6 hours PRN     11/22/17 0157    ondansetron (ZOFRAN ODT) 4 MG disintegrating tablet  Every 8 hours PRN     11/22/17 0157       Larene Pickett, PA-C 11/22/17 0329    Ripley Fraise, MD 11/22/17 [phone]

## 2017-11-21 NOTE — ED Triage Notes (Signed)
Pt states she was seen at [facility] today and was sent here for further evaluation  Pt is c/o right side abd pain that started 2 days ago and has progressively gotten worse  Pt has nausea without vomiting or diarrhea  Sent here to rule out appendicitis and cholecystitis

## 2017-11-21 NOTE — ED Notes (Signed)
Pt stated she already used the restroom but will call when she can go again

## 2017-11-21 NOTE — Patient Instructions (Addendum)
  TO ED now for further evaluation; r/o appendicitis vs cholecystitis.   IF you received an x-ray today, you will receive an invoice from [facility]. Please contact [facility] at [phone] with questions or concerns regarding your invoice.   IF you received labwork today, you will receive an invoice from [facility]. Please contact [facility] at [phone] with questions or concerns regarding your invoice.   Our billing staff will not be able to assist you with questions regarding bills from these companies.  You will be contacted with the lab results as soon as they are available. The fastest way to get your results is to activate your My Chart account. Instructions are located on the last page of this paperwork. If you have not heard from [place] regarding the results in 2 weeks, please contact this office.

## 2017-11-21 NOTE — Progress Notes (Signed)
Emily [name] 61 y.o.   Chief Complaint  Patient presents with  . Abdominal Pain    per patient stomach pain with slight nausea x 3 days    HISTORY OF PRESENT ILLNESS: This is a 61 y.o. female complaining of almost constant pain to right upper and lower abdomen that started 2 days ago. Has nausea but no vomiting; gets anxious and develps palpitations. Feels worse today than yesterday.  Abdominal Pain  This is a new problem. The current episode started in the past 7 days. The onset quality is gradual. The problem has been gradually worsening. The pain is located in the RLQ and RUQ. The pain is at a severity of 7/10. The pain is moderate. The quality of the pain is dull and a sensation of fullness. The abdominal pain does not radiate. Associated symptoms include anorexia and nausea. Pertinent negatives include no diarrhea, dysuria, fever, frequency, headaches, hematochezia, hematuria, melena, myalgias or vomiting. The pain is aggravated by certain positions. The pain is relieved by nothing. She has tried acetaminophen for the symptoms. The treatment provided no relief.     Prior to Admission medications   Medication Sig Start Date End Date Taking? Authorizing Provider  ALPRAZolam (XANAX) 0.25 MG tablet TAKE 1 TABLET BY MOUTH THREE TIMES DAILY AS NEEDED FOR ANXIETY 10/11/17  Yes McVey, Gelene Mink, PA-C  CALCIUM PO Take daily by mouth.   Yes [provider]  esomeprazole (NEXIUM) 40 MG capsule Take 1 capsule (40 mg total) daily at 12 noon by mouth. 11/01/17  Yes Jakyria Bleau, Ines Bloomer, MD  metoprolol succinate (TOPROL-XL) 25 MG 24 hr tablet TAKE 1 TABLET(25 MG) BY MOUTH DAILY 11/01/17  Yes Shearon Clonch, Ines Bloomer, MD  rosuvastatin (CRESTOR) 20 MG tablet Take 1 tablet (20 mg total) daily by mouth. 11/01/17  Yes Horald Pollen, MD  NIFEdipine (PROCARDIA-XL/ADALAT CC) 30 MG 24 hr tablet Take 1 tablet (30 mg total) by mouth daily. 10/15/17 11/14/17  Horald Pollen, MD     Allergies  Allergen Reactions  . Aspirin Hives  . Atorvastatin     Yellow fingernails  . Nsaids Hives    Patient Active Problem List   Diagnosis Date Noted  . General weakness 11/01/2017  . Viral illness 11/01/2017  . Microcytic red blood cells 05/17/2016  . GERD (gastroesophageal reflux disease) 04/12/2016  . Black stools 04/11/2016  . RUQ abdominal pain 11/02/2015  . Abdominal pain, chronic, epigastric 11/02/2015  . Abdominal bloating 11/02/2015  . Chronic high back pain 09/24/2015  . Anxiety 09/08/2014  . HTN (hypertension) 10/03/2012  . Hyperlipidemia 10/03/2012  . DYSPEPSIA&OTHER [facility] DISORDERS FUNCTION STOMACH 07/20/2009    Past Medical History:  Diagnosis Date  . Abdominal pain, chronic, epigastric 11/02/2015   Started amitriptyline at hospitalization 04/13/2016 EGD x 2 negative Also w/ chronic chest wall and back pain Would try not to work up further   . Arthritis   . Back pain   . Beta thalassemia minor    diagnosed by hematology Dr. Jennette Kettle, also with sickle cell trait  . Chronic high back pain 09/24/2015  . Colon polyps   . GERD (gastroesophageal reflux disease)   . Hyperlipidemia   . Hypertension   . Sickle cell trait ([place])    diagnosed by hematology Dr. Lindi Adie, coexsisting with beta-thalassemia minor    Past Surgical History:  Procedure Laterality Date  . ABDOMINAL HYSTERECTOMY    . COLONOSCOPY    . ESOPHAGOGASTRODUODENOSCOPY    . ESOPHAGOGASTRODUODENOSCOPY [name] 04/13/2016  Procedure: ESOPHAGOGASTRODUODENOSCOPY (EGD);  Surgeon: Gatha Mayer, MD;  Location: [facility];  Service: Endoscopy;  Laterality: [name];    Social History   Socioeconomic History  . Marital status: Married    Spouse name: Not on file  . Number of children: 3  . Years of education: Not on file  . Highest education level: Not on file  Social Needs  . Financial resource strain: Not on file  . Food insecurity - worry: Not on file  . Food insecurity - inability: Not on  file  . Transportation needs - medical: Not on file  . Transportation needs - non-medical: Not on file  Occupational History  . Not on file  Tobacco Use  . Smoking status: Never Smoker  . Smokeless tobacco: Never Used  Substance and Sexual Activity  . Alcohol use: No    Alcohol/week: 0.0 oz  . Drug use: No  . Sexual activity: Not on file  Other Topics Concern  . Not on file  Social History Narrative  . Not on file    Family History  Problem Relation Age of Onset  . Aneurysm Mother   . Diabetes Father   . Colon cancer Brother 82  . Liver cancer Brother        mets from colon  . Esophageal cancer Neg Hx   . Stomach cancer Neg Hx   . Pancreatic cancer Neg Hx      Review of Systems  Constitutional: Negative for chills and fever.  HENT: Negative.   Eyes: Negative.   Respiratory: Negative for cough and shortness of breath.   Cardiovascular: Positive for palpitations. Negative for chest pain and leg swelling.  Gastrointestinal: Positive for abdominal pain, anorexia and nausea. Negative for diarrhea, hematochezia, melena and vomiting.  Genitourinary: Negative for dysuria, flank pain, frequency and hematuria.  Musculoskeletal: Negative for myalgias and neck pain.  Skin: Negative for rash.  Neurological: Negative for dizziness, sensory change, focal weakness and headaches.  Endo/Heme/Allergies: Negative.   All other systems reviewed and are negative.   Vitals:   11/21/17 1702  BP: 104/64  Pulse: (!) 59  Resp: 16  Temp: 98.6 F (37 C)  SpO2: 97%    Physical Exam  Constitutional: She is oriented to person, place, and time. She appears well-developed and well-nourished.  HENT:  Head: Normocephalic and atraumatic.  Right Ear: External ear normal.  Left Ear: External ear normal.  Nose: Nose normal.  Mouth/Throat: Oropharynx is clear and moist.  Eyes: Conjunctivae are normal. Pupils are equal, round, and reactive to light.  Neck: Normal range of motion. Neck supple.  No JVD present. No thyromegaly present.  Cardiovascular: Normal rate, regular rhythm, normal heart sounds and intact distal pulses.  Pulmonary/Chest: Effort normal and breath sounds normal.  Abdominal: Soft. Normal appearance and bowel sounds are normal. There is tenderness in the right upper quadrant and right lower quadrant. There is guarding, tenderness at McBurney's point and positive Murphy's sign. There is no rigidity, no rebound and no CVA tenderness.  Musculoskeletal: Normal range of motion.  Lymphadenopathy:    She has no cervical adenopathy.  Neurological: She is alert and oriented to person, place, and time. No sensory deficit. She exhibits normal muscle tone. Coordination normal.  Skin: Skin is warm and dry. Capillary refill takes less than 2 seconds. No pallor.  Psychiatric: She has a normal mood and affect. Her behavior is normal.  Vitals reviewed.    ASSESSMENT & PLAN: Senora was seen today for abdominal pain.  Diagnoses and all orders for this visit:  Generalized abdominal pain  Right lower quadrant abdominal tenderness without rebound tenderness  Right upper quadrant abdominal tenderness without rebound tenderness    Patient Instructions    TO ED now for further evaluation; r/o appendicitis vs cholecystitis.   IF you received an x-ray today, you will receive an invoice from [facility]. Please contact [facility] at [phone] with questions or concerns regarding your invoice.   IF you received labwork today, you will receive an invoice from [facility]. Please contact [facility] at [phone] with questions or concerns regarding your invoice.   Our billing staff will not be able to assist you with questions regarding bills from these companies.  You will be contacted with the lab results as soon as they are available. The fastest way to get your results is to activate your My Chart account. Instructions are located on the last page of this  paperwork. If you have not heard from [place] regarding the results in 2 weeks, please contact this office.         Agustina Caroli, MD [facility]

## 2017-11-22 ENCOUNTER — Encounter (HOSPITAL_COMMUNITY): Payer: Self-pay

## 2017-11-22 LAB — URINALYSIS, ROUTINE W REFLEX MICROSCOPIC
BACTERIA UA: NONE SEEN
Bilirubin Urine: NEGATIVE
Glucose, UA: NEGATIVE mg/dL
Hgb urine dipstick: NEGATIVE
Ketones, ur: NEGATIVE mg/dL
Nitrite: NEGATIVE
PROTEIN: NEGATIVE mg/dL
SPECIFIC GRAVITY, URINE: 1.013 (ref 1.005–1.030)
pH: 7 (ref 5.0–8.0)

## 2017-11-22 MED ORDER — TRAMADOL HCL 50 MG PO TABS: 50.0000 mg | ORAL_TABLET | Freq: Four times a day (QID) | ORAL | 0 refills | Status: DC | PRN

## 2017-11-22 MED ORDER — IOPAMIDOL (ISOVUE-300) INJECTION 61%
100.0000 mL | Freq: Once | INTRAVENOUS | Status: AC | PRN
  Administered 2017-11-22: 100 mL via INTRAVENOUS

## 2017-11-22 MED ORDER — ONDANSETRON 4 MG PO TBDP: 4.0000 mg | ORAL_TABLET | Freq: Three times a day (TID) | ORAL | 0 refills | Status: DC | PRN

## 2017-11-22 MED ORDER — IOPAMIDOL (ISOVUE-300) INJECTION 61%
INTRAVENOUS | Status: AC
  Administered 2017-11-22: 100 mL via INTRAVENOUS
  Filled 2017-11-22: qty 100

## 2017-11-22 NOTE — Discharge Instructions (Signed)
Take the prescribed medication as directed.  You can take Tylenol with this if needed. Follow-up with your primary care doctor.  I would also recommend that you follow-up with your GI doctor if you continue having ongoing issues. Return to the ED for new or worsening symptoms.

## 2017-11-24 NOTE — Progress Notes (Signed)
Cardiology Office Note   Date:  11/27/2017   ID:  Arilla, Hice 1956-05-31, MRN [id number]  PCP:  Dorise Hiss, PA-C  Cardiologist:   Minus Breeding, MD  Referring:  Magda Kiel Gelene Mink, PA-C  Chief Complaint  Patient presents with  . Low BP      History of Present Illness: Emily [name] is a 61 y.o. female who is referred by Dorise Hiss, PA-C for evaluation of low BP. She was seen by Dr. Meda Coffee in 2016 for chest pain.  She had a negative stress test.  She had HTN but seemed to be sensitive to meds.     She reports that she has had some low blood pressures.  She said it used to be in the 120s over 80s but now she often has blood pressures in the 90s over 60s.  She feels lightheaded.  She feels nervous quite a bit of the time.  She says she has quite a bit of stress at work.  She has not had any frank syncope or presyncope.  He does describe some palpitations.  She is not able to quantify or qualify these but they do not sound like any sustained dysrhythmias.  She is not having any chest pressure, neck or arm discomfort but she does have some right-sided neck discomfort at times.   Past Medical History:  Diagnosis Date  . Abdominal pain, chronic, epigastric 11/02/2015   Started amitriptyline at hospitalization 04/13/2016 EGD x 2 negative Also w/ chronic chest wall and back pain Would try not to work up further   . Anxiety   . Arthritis   . Back pain   . Beta thalassemia minor    diagnosed by hematology Dr. Jennette Kettle, also with sickle cell trait  . Chronic high back pain 09/24/2015  . Colon polyps   . GERD (gastroesophageal reflux disease)   . Hyperlipidemia   . Hypertension   . Sickle cell trait ([place])    diagnosed by hematology Dr. Lindi Adie, coexsisting with beta-thalassemia minor    Past Surgical History:  Procedure Laterality Date  . ABDOMINAL HYSTERECTOMY    . COLONOSCOPY    . ESOPHAGOGASTRODUODENOSCOPY    .  ESOPHAGOGASTRODUODENOSCOPY [name] 04/13/2016   Procedure: ESOPHAGOGASTRODUODENOSCOPY (EGD);  Surgeon: Gatha Mayer, MD;  Location: [facility];  Service: Endoscopy;  Laterality: [name];     Current Outpatient Medications  Medication Sig Dispense Refill  . ALPRAZolam (XANAX) 0.25 MG tablet TAKE 1 TABLET BY MOUTH THREE TIMES DAILY AS NEEDED FOR ANXIETY 30 tablet 0  . CALCIUM PO Take 1 tablet by mouth daily.     Marland Kitchen esomeprazole (NEXIUM) 40 MG capsule Take 1 capsule (40 mg total) daily at 12 noon by mouth. 90 capsule 3  . NIFEdipine (PROCARDIA-XL/ADALAT CC) 30 MG 24 hr tablet Take 1 tablet (30 mg total) by mouth daily. 30 tablet 0  . ondansetron (ZOFRAN ODT) 4 MG disintegrating tablet Take 1 tablet (4 mg total) by mouth every 8 (eight) hours as needed for nausea. 10 tablet 0  . rosuvastatin (CRESTOR) 20 MG tablet Take 1 tablet (20 mg total) daily by mouth. 90 tablet 3  . traMADol (ULTRAM) 50 MG tablet Take 1 tablet (50 mg total) by mouth every 6 (six) hours as needed. 15 tablet 0   No current facility-administered medications for this visit.     Allergies:   Aspirin; Atorvastatin; and Nsaids    ROS:  Please see the history of present illness.  Otherwise, review of systems are positive for none.   All other systems are reviewed and negative.    PHYSICAL EXAM: VS:  BP 118/72   Pulse 76   Ht 5\' 4"  (1.626 m)   Wt 188 lb (85.3 kg)   SpO2 96%   BMI 32.27 kg/m  , BMI Body mass index is 32.27 kg/m. GENERAL:  Well appearing HEENT:  Pupils equal round and reactive, fundi not visualized, oral mucosa unremarkable NECK:  No jugular venous distention, waveform within normal limits, carotid upstroke brisk and symmetric, possible soft right carotid bruits, no thyromegaly LYMPHATICS:  No cervical, inguinal adenopathy LUNGS:  Clear to auscultation bilaterally BACK:  No CVA tenderness CHEST:  Unremarkable HEART:  PMI not displaced or sustained,S1 and S2 within normal limits, no S3, no S4, no clicks, no  rubs, no murmurs ABD:  Flat, positive bowel sounds normal in frequency in pitch, no bruits, no rebound, no guarding, no midline pulsatile mass, no hepatomegaly, no splenomegaly EXT:  2 plus pulses throughout, no edema, no cyanosis no clubbing SKIN:  No rashes no nodules NEURO:  Cranial nerves II through XII grossly intact, motor grossly intact throughout PSYCH:  Cognitively intact, oriented to person place and time    EKG:  EKG is not ordered today. The ekg ordered 06/12/17 demonstrates sinus rhythm, rate 61, axis within normal limits, no acute ST-T wave changes.   Recent Labs: 01/16/2017: TSH 1.470 06/22/2017: BNP 69.3 11/21/2017: ALT 22; BUN 16; Creatinine, Ser 0.73; Hemoglobin 12.8; Platelets 294; Potassium 3.5; Sodium 137    Lipid Panel    Component Value Date/Time   CHOL 156 06/22/2017 1446   TRIG 95 06/22/2017 1446   HDL 53 06/22/2017 1446   CHOLHDL 2.9 06/22/2017 1446   CHOLHDL 3.4 05/22/2015 1342   VLDL 23 05/22/2015 1342   LDLCALC 84 06/22/2017 1446   LDLDIRECT 174 (H) 10/02/2012 2025      Wt Readings from Last 3 Encounters:  11/26/17 188 lb (85.3 kg)  11/21/17 183 lb 9.6 oz (83.3 kg)  11/01/17 185 lb (83.9 kg)      Other studies Reviewed: Additional studies/ records that were reviewed today include: Office labs. Review of the above records demonstrates:  Please see elsewhere in the note.     ASSESSMENT AND PLAN:  HYPOTENSION:  I am going to strop the metoprolol and she will likely need to have her Procardia stopped as well.  She will come back in one month with a BP diary.  BRUIT:  I will check a carotid Doppler   Current medicines are reviewed at length with the patient today.  The patient does not have concerns regarding medicines.  The following changes have been made:  As above.   Labs/ tests ordered today include:  No orders of the defined types were placed in this encounter.    Disposition:   FU with APP in one month.     Signed, Minus Breeding, MD  11/27/2017 12:42 PM    [place]

## 2017-11-26 ENCOUNTER — Ambulatory Visit: Payer: BLUE CROSS/BLUE SHIELD | Admitting: Cardiology

## 2017-11-26 ENCOUNTER — Encounter: Payer: Self-pay | Admitting: Cardiology

## 2017-11-26 VITALS — BP 118/72 | HR 76 | Ht 64.0 in | Wt 188.0 lb

## 2017-11-26 DIAGNOSIS — R0989 Other specified symptoms and signs involving the circulatory and respiratory systems: Secondary | ICD-10-CM | POA: Diagnosis not present

## 2017-11-26 DIAGNOSIS — I952 Hypotension due to drugs: Secondary | ICD-10-CM

## 2017-11-26 NOTE — Patient Instructions (Addendum)
Medication Instructions:  STOP- Metoprolol   If you need a refill on your cardiac medications before your next appointment, please call your pharmacy.  Labwork: None Ordered   Testing/Procedures: Your physician has requested that you have a carotid duplex. This test is an ultrasound of the carotid arteries in your neck. It looks at blood flow through these arteries that supply the brain with blood. Allow one hour for this exam. There are no restrictions or special instructions.  Follow-Up: Your physician wants you to follow-up in: 1 Months with Emily [name] or Emily [name].   Thank you for choosing [facility] at [facility]!!

## 2017-11-27 ENCOUNTER — Encounter: Payer: Self-pay | Admitting: Cardiology

## 2017-11-27 DIAGNOSIS — R0989 Other specified symptoms and signs involving the circulatory and respiratory systems: Secondary | ICD-10-CM | POA: Insufficient documentation

## 2017-11-27 DIAGNOSIS — I952 Hypotension due to drugs: Secondary | ICD-10-CM | POA: Insufficient documentation

## 2017-12-03 ENCOUNTER — Ambulatory Visit: Payer: BLUE CROSS/BLUE SHIELD | Admitting: Gastroenterology

## 2017-12-05 ENCOUNTER — Ambulatory Visit: Payer: BLUE CROSS/BLUE SHIELD | Admitting: Gastroenterology

## 2017-12-05 ENCOUNTER — Telehealth: Payer: Self-pay | Admitting: Gastroenterology

## 2017-12-08 ENCOUNTER — Other Ambulatory Visit: Payer: Self-pay | Admitting: Emergency Medicine

## 2017-12-08 DIAGNOSIS — I10 Essential (primary) hypertension: Secondary | ICD-10-CM

## 2017-12-11 ENCOUNTER — Encounter (HOSPITAL_COMMUNITY): Payer: BLUE CROSS/BLUE SHIELD

## 2017-12-11 ENCOUNTER — Encounter (HOSPITAL_COMMUNITY): Payer: Self-pay

## 2017-12-11 ENCOUNTER — Ambulatory Visit (HOSPITAL_COMMUNITY)
Admission: RE | Admit: 2017-12-11 | Discharge: 2017-12-11 | Disposition: A | Discharge status: Home / Self Care | Payer: BLUE CROSS/BLUE SHIELD | Source: Ambulatory Visit | Attending: Cardiovascular Disease | Admitting: Cardiovascular Disease

## 2017-12-11 DIAGNOSIS — R0989 Other specified symptoms and signs involving the circulatory and respiratory systems: Secondary | ICD-10-CM | POA: Diagnosis not present

## 2017-12-11 DIAGNOSIS — I6521 Occlusion and stenosis of right carotid artery: Secondary | ICD-10-CM | POA: Diagnosis not present

## 2017-12-11 NOTE — Progress Notes (Unsigned)
Rebeca Allegra from [facility] was the Interpreter for today's carotid duplex.

## 2017-12-16 ENCOUNTER — Encounter: Payer: Self-pay | Admitting: *Deleted

## 2017-12-19 ENCOUNTER — Encounter: Payer: Self-pay | Admitting: *Deleted

## 2017-12-30 ENCOUNTER — Telehealth: Payer: Self-pay | Admitting: Physician Assistant

## 2017-12-30 NOTE — Telephone Encounter (Signed)
New Message     Patient calling for results she does not understand them , she only speaks spanish.  Tried to put call through to triage with Interpreter no answer

## 2017-12-31 NOTE — Telephone Encounter (Signed)
Left message to call back  Used Translator ID [id number]

## 2018-01-06 ENCOUNTER — Ambulatory Visit: Payer: BLUE CROSS/BLUE SHIELD | Admitting: Physician Assistant

## 2018-01-06 ENCOUNTER — Encounter: Payer: Self-pay | Admitting: Physician Assistant

## 2018-01-06 VITALS — BP 116/70 | HR 82 | Ht 66.0 in | Wt 188.0 lb

## 2018-01-06 DIAGNOSIS — E785 Hyperlipidemia, unspecified: Secondary | ICD-10-CM | POA: Diagnosis not present

## 2018-01-06 DIAGNOSIS — I10 Essential (primary) hypertension: Secondary | ICD-10-CM

## 2018-01-06 DIAGNOSIS — R0789 Other chest pain: Secondary | ICD-10-CM

## 2018-01-06 DIAGNOSIS — I6523 Occlusion and stenosis of bilateral carotid arteries: Secondary | ICD-10-CM

## 2018-01-06 MED ORDER — METOPROLOL SUCCINATE ER 25 MG PO TB24: 12.5000 mg | ORAL_TABLET | Freq: Every day | ORAL | 6 refills | Status: DC

## 2018-01-06 NOTE — Progress Notes (Signed)
Cardiology Office Note   Date:  01/06/2018   ID:  Emily [name], Emily [name] [birth year], MRN [id number]  PCP:  Dorise Hiss, PA-C  Cardiologist: Dr. Percival Spanish, 11/26/2017 Rosaria Ferries, PA-C   Chief Complaint  Patient presents with  . Follow-up    follow up doppler; interpreter ID [id number]    History of Present Illness: Emily [name] is a 62 y.o. female with a history of CP>>Neg stress test 2015, HTN, Abd pain and chest pain, GERD, HLD, OA  12/04 office visit, Toprol XL 25 mg discontinued, Procardia may need to be stopped, patient to follow-up in a month with a BP diary, carotid bruit noted and Dopplers ordered>>mild-mod ICA dz>repeat in 1 year.  Emily [name] presents for cardiology follow up. A video interpreter is used.  SBP has been up to 160 and DBP almost 100. She takes a pill and it comes down. She is compliant with her Procardia, takes it between 4 and 6 am.   She notices her BP running up in the evenings, SBP 140s-160s. She is taking a piece of the metoprolol and it helps her sx. She also gets palpitations when this happens, feels her HR increase. Does not know how fast. She does not get light-headed or dizzy  She has had chest pain. Last Friday, she felt a sticking pain in her chest, it faded away without intervention. She wonders why this happens.   She wonders what the results of the carotid Dopplers were.    Past Medical History:  Diagnosis Date  . Abdominal pain, chronic, epigastric 11/02/2015   Started amitriptyline at hospitalization 04/13/2016 EGD x 2 negative Also w/ chronic chest wall and back pain Would try not to work up further   . Anxiety   . Arthritis   . Back pain   . Beta thalassemia minor    diagnosed by hematology Dr. Jennette Kettle, also with sickle cell trait  . Chronic high back pain 09/24/2015  . Colon polyps   . GERD (gastroesophageal reflux disease)   . Hyperlipidemia   . Hypertension   . Sickle cell trait ([place])    diagnosed by hematology Dr. Lindi Adie, coexsisting with beta-thalassemia minor    Past Surgical History:  Procedure Laterality Date  . ABDOMINAL HYSTERECTOMY    . COLONOSCOPY    . ESOPHAGOGASTRODUODENOSCOPY    . ESOPHAGOGASTRODUODENOSCOPY [name] 04/13/2016   Procedure: ESOPHAGOGASTRODUODENOSCOPY (EGD);  Surgeon: Gatha Mayer, MD;  Location: [facility];  Service: Endoscopy;  Laterality: [name];    Current Outpatient Medications  Medication Sig Dispense Refill  . ALPRAZolam (XANAX) 0.25 MG tablet TAKE 1 TABLET BY MOUTH THREE TIMES DAILY AS NEEDED FOR ANXIETY 30 tablet 0  . CALCIUM PO Take 1 tablet by mouth daily.     Marland Kitchen esomeprazole (NEXIUM) 40 MG capsule Take 1 capsule (40 mg total) daily at 12 noon by mouth. 90 capsule 3  . NIFEdipine (PROCARDIA-XL/ADALAT CC) 30 MG 24 hr tablet Take 1 tablet (30 mg total) by mouth daily. 30 tablet 0  . rosuvastatin (CRESTOR) 20 MG tablet Take 1 tablet (20 mg total) daily by mouth. 90 tablet 3  . ondansetron (ZOFRAN ODT) 4 MG disintegrating tablet Take 1 tablet (4 mg total) by mouth every 8 (eight) hours as needed for nausea. (Patient not taking: Reported on 01/06/2018) 10 tablet 0  . traMADol (ULTRAM) 50 MG tablet Take 1 tablet (50 mg total) by mouth every 6 (six) hours as needed. (Patient not taking: Reported on 01/06/2018) 15  tablet 0   No current facility-administered medications for this visit.     Allergies:   Aspirin; Atorvastatin; and Nsaids    Social History:  The patient  reports that  has never smoked. she has never used smokeless tobacco. She reports that she does not drink alcohol or use drugs.   Family History:  The patient's family history includes Aneurysm in her mother; Colon cancer (age of onset: 34) in her brother; Diabetes in her father; Liver cancer in her brother.    ROS:  Please see the history of present illness. All other systems are reviewed and negative.    PHYSICAL EXAM: VS:  BP 116/70   Pulse 82   Ht 5\' 6"  (1.676 m)   Wt 188  lb (85.3 kg)   BMI 30.34 kg/m  , BMI Body mass index is 30.34 kg/m. GEN: Well nourished, well developed, female in no acute distress  HEENT: normal for age  Neck: no JVD, ?R carotid bruit, no masses Cardiac: RRR; soft murmur, no rubs, or gallops Respiratory:  clear to auscultation bilaterally, normal work of breathing GI: soft, nontender, nondistended, + BS MS: no deformity or atrophy; no edema; distal pulses are 2+ in all 4 extremities   Skin: warm and dry, no rash Neuro:  Strength and sensation are intact Psych: euthymic mood, full affect   EKG:  EKG is not ordered today.  CAROTID DOPPLERS: 12/11/2017 Right Carotid: There is evidence in the right ICA of a 40-59% stenosis. Left Carotid: There is evidence in the left ICA of a 1-39% stenosis. Follow up in one year.  Call Ms. Veverly Fells with the results and send results to Dorise Hiss, PA-C  Recent Labs: 01/16/2017: TSH 1.470 06/22/2017: BNP 69.3 11/21/2017: ALT 22; BUN 16; Creatinine, Ser 0.73; Hemoglobin 12.8; Platelets 294; Potassium 3.5; Sodium 137    Lipid Panel    Component Value Date/Time   CHOL 156 06/22/2017 1446   TRIG 95 06/22/2017 1446   HDL 53 06/22/2017 1446   CHOLHDL 2.9 06/22/2017 1446   CHOLHDL 3.4 05/22/2015 1342   VLDL 23 05/22/2015 1342   LDLCALC 84 06/22/2017 1446   LDLDIRECT 174 (H) 10/02/2012 2025     Wt Readings from Last 3 Encounters:  01/06/18 188 lb (85.3 kg)  11/26/17 188 lb (85.3 kg)  11/21/17 183 lb 9.6 oz (83.3 kg)     Other studies Reviewed: Additional studies/ records that were reviewed today include: office notes, hospital records and testing.  ASSESSMENT AND PLAN:  1.  HTN: BP is running up in the afternoon consistently. She is getting improvement from the metoprolol. Restart this at 1/2 tab daily, take at lunch or in the afternoon to minimize side effects. Continue to follow BP.  2. CP: Explained that we had evaluated her for the life-threatening causes of CP,  did not find any. Non-life-threatening causes are much harder to determine. Ok to take Tylenol for the pain.  No further workup indicated at this time.  3. Carotid bruit: see Doppler results above, recheck in 1 year.  I explained that blood pressure control and cholesterol control were very important to keep the blockages in her neck from getting worse.  4.  Hyperlipidemia: With the carotid plaque seen on Doppler studies, LDL goal is now 70.  Continue statin and low-cholesterol diet, follow-up with Dr. Percival Spanish in 6 months and recheck at that time.   Current medicines are reviewed at length with the patient today.  The patient has concerns regarding  medicines. Concerns were addressed The following changes have been made:  Restart Toprol XL 25 mg at 1/2 tab qd  Labs/ tests ordered today include: No orders of the defined types were placed in this encounter.    Disposition:   FU with Dr. Percival Spanish  Signed, Rosaria Ferries, PA-C  01/06/2018 3:00 PM    [place] Phone: [phone]; Fax: [phone]  This note was written with the assistance of speech recognition software. Please excuse any transcriptional errors.

## 2018-01-06 NOTE — Patient Instructions (Addendum)
Medication Instructions:  RESTART Metoprolol 12.5mg  Take 1 tablet daily at Lunch time CONTINUE Procardia Take 1 tablet daily in the morning   Labwork: Your physician recommends that you return for lab work in: 6 MONTHS FASTING-LIPIDS, CMET  Testing/Procedures: Your physician has requested that you have a carotid duplex. This test is an ultrasound of the carotid arteries in your neck. It looks at blood flow through these arteries that supply the brain with blood. Allow one hour for this exam. There are no restrictions or special instructions.  REPEAT IN 1 YEAR  Follow-Up: Your physician wants you to follow-up in: 6 MONTHS with DR [facility]. You will receive a reminder letter in the mail two months in advance. If you don't receive a letter, please call our office to schedule the follow-up appointment.  Any Other Special Instructions Will Be Listed Below (If Applicable).  If you need a refill on your cardiac medications before your next appointment, please call your pharmacy.   Instrucciones de medicacin: REINICIAR Metoprolol 12.5 mg Tomar 1 tableta al da a la hora del almuerzo [employer] Procardia tomar 1 comprimido al da por la maana  Trabajo de laboratorio: Su mdico recomienda que regrese para el trabajo de laboratorio en: 6 MESES DE AYUNO-LPIDOS, CMET  Pruebas / Procedimientos: Su mdico le ha pedido que tenga un dplex carotdeo. Esta prueba es una ecografa de las arterias cartidas en su cuello. Observa el flujo de sangre a travs de estas arterias que suministran sangre al cerebro. Permita una hora para [employer]. No hay restricciones ni instrucciones especiales. REPETIR EN 1 AO  Seguir: Su mdico desea que realice un seguimiento en: 6 MESES con DR Percival Spanish. Recibir una carta de recordatorio por correo con [employer] de anticipacin. Si no recibe [employer], llame a nuestra oficina para programar la cita de seguimiento  Cualquier otra instruccin especial se enumerar a  continuacin (si corresponde).  Si necesita volver a surtir sus medicamentos cardacos antes de su prxima cita, llame a su farmacia.

## 2018-01-07 NOTE — Telephone Encounter (Signed)
Patient received information at office appointment 01/06/18 with extender

## 2018-01-20 ENCOUNTER — Ambulatory Visit (INDEPENDENT_AMBULATORY_CARE_PROVIDER_SITE_OTHER): Payer: BLUE CROSS/BLUE SHIELD

## 2018-01-20 ENCOUNTER — Other Ambulatory Visit: Payer: Self-pay

## 2018-01-20 ENCOUNTER — Ambulatory Visit: Payer: BLUE CROSS/BLUE SHIELD | Admitting: Family Medicine

## 2018-01-20 ENCOUNTER — Ambulatory Visit (HOSPITAL_COMMUNITY)
Admission: RE | Admit: 2018-01-20 | Discharge: 2018-01-20 | Disposition: A | Discharge status: Home / Self Care | Payer: BLUE CROSS/BLUE SHIELD | Source: Ambulatory Visit | Attending: Family Medicine | Admitting: Family Medicine

## 2018-01-20 ENCOUNTER — Encounter: Payer: Self-pay | Admitting: Family Medicine

## 2018-01-20 ENCOUNTER — Ambulatory Visit: Payer: BLUE CROSS/BLUE SHIELD

## 2018-01-20 VITALS — BP 109/76 | HR 83 | Temp 98.5°F | Resp 16 | Ht 66.0 in | Wt 185.4 lb

## 2018-01-20 DIAGNOSIS — S299XXA Unspecified injury of thorax, initial encounter: Secondary | ICD-10-CM

## 2018-01-20 DIAGNOSIS — R918 Other nonspecific abnormal finding of lung field: Secondary | ICD-10-CM | POA: Diagnosis not present

## 2018-01-20 DIAGNOSIS — R072 Precordial pain: Secondary | ICD-10-CM

## 2018-01-20 DIAGNOSIS — K449 Diaphragmatic hernia without obstruction or gangrene: Secondary | ICD-10-CM | POA: Diagnosis not present

## 2018-01-20 DIAGNOSIS — R0789 Other chest pain: Secondary | ICD-10-CM

## 2018-01-20 LAB — POCT CBC
HCT, POC: 40 % (ref 37.7–47.9)
Hemoglobin: 12.9 g/dL (ref 12.2–16.2)
Lymph, poc: 2.7 (ref 0.6–3.4)
MCH, POC: 25 pg — AB (ref 27–31.2)
MCHC: 32.3 g/dL (ref 31.8–35.4)
MCV: 77.2 fL — AB (ref 80–97)
MID (cbc): 0.3 (ref 0–0.9)
MPV: 7.9 fL (ref 0–99.8)
PLATELET COUNT, POC: 299 10*3/uL (ref 142–424)
POC Granulocyte: 5.4 (ref 2–6.9)
POC LYMPH PERCENT: 32.1 %L (ref 10–50)
POC MID %: 3.2 %M (ref 0–12)
RBC: 5.18 M/uL (ref 4.04–5.48)
RDW, POC: 14.3 %
WBC: 8.3 10*3/uL (ref 4.6–10.2)

## 2018-01-20 NOTE — Progress Notes (Signed)
Chief Complaint  Patient presents with  . injured ribs/sternum    per pt she is having pain in mid chest that shoots to back, pain is sharp and stabbing and causes dyspnea.  onset: 01/16/18    HPI  Translator ID [id number] Pain started Friday 01/16/18 She walked through a narrow space and hit her chest on the wall which started the pain She reports that the pain is relieved by tylenol which is the only things that she can take She reports that the pain is always middle chest and radiates to the left back She states that taking a deep breath is hard She states that she cannot lay on her left side due to the pain  When she gets pain she would rate her pain as 8/10 There is some shortness of breath No diaphoresis  She applied warm compress and tylenol which helps She did not go to work today.  Her job title is Chiropodist She feels like she pressed against a wall and a box squeezed her chest Then another explanation was the box fell and hurt her.   Past Medical History:  Diagnosis Date  . Abdominal pain, chronic, epigastric 11/02/2015   Started amitriptyline at hospitalization 04/13/2016 EGD x 2 negative Also w/ chronic chest wall and back pain Would try not to work up further   . Anxiety   . Arthritis   . Back pain   . Beta thalassemia minor    diagnosed by hematology Dr. Jennette Kettle, also with sickle cell trait  . Chronic high back pain 09/24/2015  . Colon polyps   . GERD (gastroesophageal reflux disease)   . Hyperlipidemia   . Hypertension   . Sickle cell trait ([place])    diagnosed by hematology Dr. Lindi Adie, coexsisting with beta-thalassemia minor    Current Outpatient Medications  Medication Sig Dispense Refill  . ALPRAZolam (XANAX) 0.25 MG tablet TAKE 1 TABLET BY MOUTH THREE TIMES DAILY AS NEEDED FOR ANXIETY 30 tablet 0  . CALCIUM PO Take 1 tablet by mouth daily.     Marland Kitchen esomeprazole (NEXIUM) 40 MG capsule Take 1 capsule (40 mg total) daily at 12 noon by mouth. 90 capsule 3  .  metoprolol succinate (TOPROL-XL) 25 MG 24 hr tablet Take 0.5 tablets (12.5 mg total) by mouth daily. Take at lunch or in the afternoon 30 tablet 6  . NIFEdipine (PROCARDIA-XL/ADALAT CC) 30 MG 24 hr tablet Take 1 tablet (30 mg total) by mouth daily. 30 tablet 0  . rosuvastatin (CRESTOR) 20 MG tablet Take 1 tablet (20 mg total) daily by mouth. 90 tablet 3   No current facility-administered medications for this visit.     Allergies:  Allergies  Allergen Reactions  . Aspirin Hives  . Atorvastatin     Yellow fingernails  . Nsaids Hives    Past Surgical History:  Procedure Laterality Date  . ABDOMINAL HYSTERECTOMY    . COLONOSCOPY    . ESOPHAGOGASTRODUODENOSCOPY    . ESOPHAGOGASTRODUODENOSCOPY [name] 04/13/2016   Procedure: ESOPHAGOGASTRODUODENOSCOPY (EGD);  Surgeon: Gatha Mayer, MD;  Location: [facility];  Service: Endoscopy;  Laterality: [name];    Social History   Socioeconomic History  . Marital status: Married    Spouse name: None  . Number of children: 3  . Years of education: None  . Highest education level: None  Social Needs  . Financial resource strain: None  . Food insecurity - worry: None  . Food insecurity - inability: None  . Transportation needs -  medical: None  . Transportation needs - non-medical: None  Occupational History  . None  Tobacco Use  . Smoking status: Never Smoker  . Smokeless tobacco: Never Used  Substance and Sexual Activity  . Alcohol use: No    Alcohol/week: 0.0 oz  . Drug use: No  . Sexual activity: None  Other Topics Concern  . None  Social History Narrative   Lives with husband and works at [employer] History  Problem Relation Age of Onset  . Aneurysm Mother   . Diabetes Father   . Colon cancer Brother 45  . Liver cancer Brother        mets from colon  . Esophageal cancer Neg Hx   . Stomach cancer Neg Hx   . Pancreatic cancer Neg Hx      ROS Review of Systems See HPI Constitution: No fevers or chills No  malaise No diaphoresis Skin: No rash or itching Eyes: no blurry vision, no double vision GU: no dysuria or hematuria Neuro: no dizziness or headaches  all others reviewed and negative   Objective: Vitals:   01/20/18 1439  BP: 109/76  Pulse: 83  Resp: 16  Temp: 98.5 F (36.9 C)  TempSrc: Oral  SpO2: 95%  Weight: 185 lb 6.4 oz (84.1 kg)  Height: 5\' 6"  (1.676 m)    Physical Exam  Constitutional: She is oriented to person, place, and time. She appears well-developed and well-nourished.  HENT:  Head: Normocephalic and atraumatic.  Eyes: Conjunctivae and EOM are normal.  Cardiovascular: Normal rate, regular rhythm and normal heart sounds.  No murmur heard. Pulmonary/Chest: Effort normal and breath sounds normal. No stridor. No respiratory distress.    Abdominal: Soft. Bowel sounds are normal. She exhibits no distension. There is no tenderness. There is no guarding.  Neurological: She is alert and oriented to person, place, and time.     ecg - sinus rhythm, no st elevation  cxr without any fractures   Assessment and Plan [place] was seen today for injured ribs/sternum.  Diagnoses and all orders for this visit:  Substernal chest pain -     EKG 12-Lead -     Cancel: DG Chest 2 View; Future -     DG Ribs Bilateral W/Chest; Future -     Cancel: CT CHEST W WO CONTRAST; Future -     I-Stat Creatinine; Future -     Comprehensive metabolic panel -     Lipase -     POCT CBC  Injury of chest wall, initial encounter -     Cancel: DG Chest 2 View; Future -     DG Ribs Bilateral W/Chest; Future -     Cancel: CT CHEST W WO CONTRAST; Future -     I-Stat Creatinine; Future -     Comprehensive metabolic panel -     Lipase -     POCT CBC  Other chest pain -     CT Chest Wo Contrast; Future   Will check for other causes Rib xray was clear Concerning about her discomfort Will continue evaluation and if negative will treat for muscloskeletal She was given a work note and  advised to take tylenol She has a history of sickle cell trait and has her gallbladder present but not other history that would be contributory Patient refused stat CT tonight and will follow up in the morning ER precautions reviewed  A total of 40 minutes were spent face-to-face with the patient  during this encounter and over half of that time was spent on counseling and coordination of care.  [place]

## 2018-01-20 NOTE — Patient Instructions (Addendum)
CLINICAL DATA:  Exquisite sternal chest pain after chest injury.  EXAM: BILATERAL RIBS AND CHEST - 4+ VIEW  COMPARISON:  01/16/2017.  FINDINGS: No fracture or other bone lesions are seen involving the ribs. There is no evidence of pneumothorax or pleural effusion. Both lungs are clear. Heart size and mediastinal contours are within normal limits. The sternum appears intact. If strong clinical concern exists however, CT chest with contrast is more sensitive.  IMPRESSION: Negative.  See discussion above.   Electronically Signed   By: Staci Righter M.D.   On: 01/20/2018 16:09   Dolor de pecho inespecfico (Nonspecific Chest Pain) El dolor de pecho puede deberse a muchas enfermedades diferentes. Siempre existe una posibilidad de que el dolor est relacionado con algo grave, como un infarto de miocardio o un cogulo sanguneo en los pulmones. Hay muchas enfermedades que no son potencialmente mortales que pueden causar dolor de [place]. Si tiene Social research officer, government de Engineer, building services, es muy importante que se controle con el mdico. CAUSAS Las causas del dolor de pecho pueden ser las siguientes:  Acidez estomacal.  Neumona o bronquitis.  Ansiedad o estrs.  Inflamacin de la zona que rodea al corazn (pericarditis) o a los pulmones (pleuritis o pleuresa).  Un cogulo sanguneo en el pulmn.  Colapso de un pulmn (neumotrax), que puede aparecer de [employer] repentina por s solo (neumotrax espontneo) o debido a un traumatismo en el trax.  Culebrilla (virus de la varicela zster).  Infarto de miocardio.  Dao de los [place], los msculos y los cartlagos que conforman la pared torcica. Esto puede incluir lo siguiente: ? Hematomas seos debido a lesiones. ? Distensiones musculares o de los cartlagos por tos frecuente o repetida, o por exceso de trabajo. ? Fractura de una o ms costillas. ? Dolor de Database administrator debido a inflamacin (costocondritis). FACTORES DE RIESGO Los factores de riesgo de  tener dolor de pecho pueden incluir lo siguiente:  Actividades que incrementan el riesgo de sufrir traumatismos o lesiones en el trax.  Infecciones o enfermedades respiratorias que causan tos frecuente.  Enfermedades o [employer] comidas que pueden causar Geographical information systems officer.  Enfermedades cardacas o antecedentes familiares de enfermedades cardacas.  Enfermedades o comportamientos de salud que aumentan el riesgo de tener un cogulo sanguneo.  Haber tenido varicela (varicela zster). SIGNOS Y SNTOMAS El dolor de pecho puede provocar las siguientes sensaciones:  Ardor u hormigueo en la superficie o en lo profundo del pecho.  Dolor opresivo, continuo o constrictivo.  Dolor vago o intenso que empeora al [employer], toser o inhalar profundamente.  Dolor que tambin se siente en la espalda, el cuello, el hombro o el brazo, o dolor que se irradia a cualquiera de estas zonas. El dolor de pecho puede aparecer y Armed forces operational officer, o bien puede ser constante. DIAGNSTICO Ileene Hutchinson se necesiten anlisis de laboratorio u otros estudios para Animator causa del Social research officer, government. El mdico puede indicarle que se haga una prueba llamada EGC (electrocadiograma) ambulatorio. El Radio broadcast assistant los patrones de los latidos cardacos en el momento en que se realiza el [place]. Tambin pueden hacerle otros estudios, por ejemplo:  Ecocardiograma transtorcico (ETT). Durante el ecocardiograma, se usan ondas sonoras para crear una imagen de todas las estructuras cardacas y evaluar cmo circula la sangre por el corazn.  Ecocardiograma transesofgico (ETE).Este es un estudio de diagnstico por imgenes ms avanzado que el obtiene imgenes del interior del cuerpo. Le permite al mdico ver el corazn con mayor detalle.  Monitoreo cardaco. Permite que el mdico controle la frecuencia y [place]  ritmo cardaco en tiempo real.  Monitor Holter. Es un dispositivo porttil que [employer] latidos del corazn y puede ayudar a  Retail buyer las arritmias cardacas. Le permite al [employer] registrar la actividad [place], si es necesario.  Pruebas de esfuerzo. Estas pueden realizarse durante el ejercicio o mediante la administracin de un medicamento que acelera los latidos del corazn.  Anlis[place] de [place].  Diagnstico por imgenes. TRATAMIENTO El tratamiento depende de la causa del dolor de [place]. El tratamiento puede incluir lo siguiente:  Medicamentos. Estos pueden incluir lo siguiente: ? Inhibidores de Psychologist, forensic. ? Antiinflamatorios. ? Analgsicos para las enfermedades inflamatorias. ? Antibiticos, si hay una infeccin. ? Medicamentos para [employer] ? Medicamentos para tratar la enfermedad arterial coronaria.  Tratamiento complementario para las enfermedades que no requieren la toma de medicamentos. Esto puede incluir lo siguiente: ? Descansar. ? Aplicar compresas fras o calientes en las zonas lesionadas. ? Limitar las actividades hasta que [employer]ident. INSTRUCCIONES PARA EL CUIDADO EN EL HOGAR  Si le recetaron antibiticos, asegrese de terminarlos, incluso si comienza a sentirse mejor.  Evite las [employer] causen dolor de [place].  No consuma ningn producto que contenga tabaco, lo que incluye cigarrillos, tabaco de Higher education careers adviser o Psychologist, sport and exercise. Si necesita ayuda para dejar de fumar, consulte al mdico.  No beba alcohol.  Tome los medicamentos solamente como se lo haya indicado el mdico.  Concurra a todas las visitas de control como se lo haya indicado el mdico. Esto es importante. Esto incluye otros estudios si el dolor de pecho no desaparece.  Si la acidez es la causa del dolor de [place], tal vez le aconsejen que mantenga la cabeza levantada (elevada) mientras duerme. Esto reduce la probabilidad de que el cido retroceda del estmago al esfago.  Haga cambios en su estilo de vida como se lo haya indicado el mdico. Estos  pueden incluir lo siguiente: ? Practicar actividad fsica con regularidad. Pida al mdico que le sugiera algunas actividades que sean seguras para usted. ? Consumir una dieta cardiosaludable. Un nutricionista matriculado puede ayudarlo a Adult nurse saludables. ? Mantener un peso saludable. ? Controlar la diabetes, si es necesario. ? Reducir las situaciones de estrs.  SOLICITE ATENCIN MDICA SI:  El dolor de pecho no desaparece despus del tratamiento.  Tiene una erupcin cutnea con ampollas en el pecho.  Tiene fiebre.  SOLICITE ATENCIN MDICA DE INMEDIATO SI:  El dolor en el pecho es ms intenso.  La tos empeora, o expectora sangre.  Siente un dolor abdominal intenso.  Siente debilidad intensa.  Se desmaya.  Tiene escalofros.  Tiene una molestia repentina e inexplicable en el pecho.  Tiene molestias repentinas e [employer], la espalda, el cuello o la [place].  Le falta el aire en cualquier momento.  Comienza a sudar de [place] repentina o la piel se le humedece.  Siente nuseas o vomita.  Se siente repentinamente mareado o se desmaya.  Siente que el corazn comienza a latir rpidamente o que se saltea latidos. Estos sntomas pueden representar un problema grave que constituye Engineer, maintenance (IT). No espere hasta que los sntomas desaparezcan. Solicite atencin mdica de inmediato. Comunquese con el servicio de emergencias de su localidad (911 en los [place]). No conduzca por sus propios medios [employer] Financial. Esta informacin no tiene Marine scientist el consejo del mdico. Asegrese de hacerle al mdico cualquier pregunta que tenga. Document Released: 12/10/2005 Document Revised: 12/31/2014 Document Reviewed: 06/18/2016 Elsevier Interactive  Patient Education  2017 Elsevier Inc.  

## 2018-01-21 ENCOUNTER — Telehealth: Payer: Self-pay | Admitting: Physician Assistant

## 2018-01-21 LAB — COMPREHENSIVE METABOLIC PANEL
ALBUMIN: 4.4 g/dL (ref 3.6–4.8)
ALT: 26 IU/L (ref 0–32)
AST: 18 IU/L (ref 0–40)
Albumin/Globulin Ratio: 1.4 (ref 1.2–2.2)
Alkaline Phosphatase: 134 IU/L — ABNORMAL HIGH (ref 39–117)
BUN / CREAT RATIO: 16 (ref 12–28)
BUN: 11 mg/dL (ref 8–27)
Bilirubin Total: <0.2 mg/dL (ref 0.0–1.2)
CALCIUM: 10.1 mg/dL (ref 8.7–10.3)
CO2: 26 mmol/L (ref 20–29)
CREATININE: 0.67 mg/dL (ref 0.57–1.00)
Chloride: 105 mmol/L (ref 96–106)
GFR calc Af Amer: 110 mL/min/{1.73_m2} (ref >59)
GFR, EST NON AFRICAN AMERICAN: 96 mL/min/{1.73_m2} (ref >59)
Globulin, Total: 3.2 g/dL (ref 1.5–4.5)
Glucose: 83 mg/dL (ref 65–99)
Potassium: 4.2 mmol/L (ref 3.5–5.2)
Sodium: 144 mmol/L (ref 134–144)
Total Protein: 7.6 g/dL (ref 6.0–8.5)

## 2018-01-21 LAB — LIPASE: Lipase: 23 U/L (ref 14–72)

## 2018-01-21 NOTE — Telephone Encounter (Signed)
[name] STAT CT performed on 01/20/18 has not been approved for retro auth and is in clinical review with [employer]. They stated "CT is not indicated to evaluate chest pain or shortness of breath in the absence of a documented chest x-ray abnormality or concern about a specific disease entity which would normally require advanced imaging for diagnosis." A provider peer to peer can be done to provide information concerning medical necessity to [facility] at [phone]. We will need peer to peer done to try and get pt's insurance to cover this. I left a message with the pre cert center to let them know the status.

## 2018-01-21 NOTE — Telephone Encounter (Signed)
Copied from [place] [phone]. Topic: General - Other >> Jan 21, 2018  8:24 AM Lolita Rieger, RMA wrote: Reason for CRM: Tillie Rung from [facility] cone called for certification for a CT scan for pt please call her @[phone] 

## 2018-01-23 ENCOUNTER — Encounter: Payer: Self-pay | Admitting: Radiology

## 2018-01-23 ENCOUNTER — Other Ambulatory Visit: Payer: Self-pay

## 2018-01-23 ENCOUNTER — Ambulatory Visit (INDEPENDENT_AMBULATORY_CARE_PROVIDER_SITE_OTHER): Payer: BLUE CROSS/BLUE SHIELD | Admitting: Physician Assistant

## 2018-01-23 VITALS — BP 123/74 | HR 71 | Temp 98.3°F | Ht 63.78 in | Wt 184.0 lb

## 2018-01-23 DIAGNOSIS — R079 Chest pain, unspecified: Secondary | ICD-10-CM

## 2018-01-23 DIAGNOSIS — M549 Dorsalgia, unspecified: Secondary | ICD-10-CM | POA: Diagnosis not present

## 2018-01-23 DIAGNOSIS — F418 Other specified anxiety disorders: Secondary | ICD-10-CM

## 2018-01-23 LAB — POCT URINALYSIS DIP (MANUAL ENTRY)
Glucose, UA: NEGATIVE mg/dL
Leukocytes, UA: NEGATIVE
Nitrite, UA: NEGATIVE
Spec Grav, UA: 1.025 (ref 1.010–1.025)
Urobilinogen, UA: 0.2 U/dL
pH, UA: 5.5 (ref 5.0–8.0)

## 2018-01-23 LAB — POC MICROSCOPIC URINALYSIS (UMFC)

## 2018-01-23 MED ORDER — ALPRAZOLAM 0.25 MG PO TABS: 0.2500 mg | ORAL_TABLET | Freq: Three times a day (TID) | ORAL | 0 refills | Status: DC | PRN

## 2018-01-23 MED ORDER — CYCLOBENZAPRINE HCL 5 MG PO TABS: 5.0000 mg | ORAL_TABLET | Freq: Three times a day (TID) | ORAL | 1 refills | Status: DC | PRN

## 2018-01-23 NOTE — Patient Instructions (Addendum)
- Su tomografa computarizada no muestra ningn hueso roto o sangrado - Evite usar sujetadores ajustados durante los prximos 3-5 das. - aplique calor o hielo a sus msculos doloridos durante 20 minutos dos veces al da - Sumrgete en un bao caliente para relajar los msculos. - Mantente bien hidratado. beber 32-64 onzas de agua diariamente - estira tu pecho al llegar suavemente a tus brazos detrs de la espalda baja. Si puedes agarrar tus manos juntas y elevarlas suavemente - Vuelve si no ests mejorando en 2-3 semanas.  - avoid wearing tight fitting bras for the next 3-5 days - apply heat to your sore muscles for 20 minutes twice daily - soak in a hot bath to help your muscles relax  - stay well hydrated. drink 32-64 oz water daily - stretch your chest by gently reaching your arms behind your lower back. if you can grasp your hands together and gently raise them  - come back if you are not improving in 2-3 weeks.   Thank you for coming in today. I hope you feel we met your needs.  Feel free to call PCP if you have any questions or further requests.  Please consider signing up for MyChart if you do not already have it, as this is a great way to communicate with me.  Best,  [employer], PA-C  Dolor en la pared torcica Chest Wall Pain El dolor en la pared torcica se produce en los huesos y los msculos del pecho o alrededor de Orthoptist. A veces, una lesin Arts administrator. En ocasiones, la causa puede ser desconocida. Este dolor puede durar varias semanas. Siga estas instrucciones en su casa: Est atento a cualquier cambio en los sntomas. Tome estas medidas para Theatre stage manager dolor:  Haga reposo como se lo haya indicado el [place] actividades que causan dolor. Estas pueden ser [employer] requieren el uso de los msculos del trax, los abdominales o los laterales para levantar objetos pesados.  Si se lo indican, aplique hielo sobre la zona dolorida: ? Field seismologist hielo en  una bolsa plstica. ? Coloque una [employer] piel y la bolsa de hielo. ? Coloque el hielo durante 86mnutos, 2 a 3veces por da.  Tome los medicamentos de venta libre y los recetados solamente como se lo haya indicado el mdico.  No consuma productos que contengan tabaco, incluidos cigarrillos, tabaco de mHigher education careers advisery cPsychologist, sport and exercise Si necesita ayuda para dejar de fumar, consulte al mdico.  Concurra a todas las visitas de control como se lo haya indicado el mdico. Esto es importante.  Comunquese con un mdico si:  Tiene fiebre.  El dolor de [place]  Aparecen nuevos sntomas. Solicite ayuda de inmediato si:  Tiene nuseas o vmitos.  TPhilbert Risero tiene sensacin de desvanecimiento.  Tiene tos con flema (esputo) o expectora sangre al toser.  Le falta el aire. Esta informacin no tiene cMarine scientistel consejo del mdico. Asegrese de hacerle al mdico cualquier pregunta que tenga. Document Released: 01/21/2007 Document Revised: 03/11/2017 Document Reviewed: 03/07/2015 Elsevier Interactive Patient Education  2018 [employer]   IF you received an x-ray today, you will receive an invoice from [facility]. Please contact [facility] at [phone] questions or concerns regarding your invoice.   IF you received labwork today, you will receive an invoice from [place] Please contact [facility] at [phone] questions or concerns regarding your invoice.   Our billing staff will not be able to assist you  with questions regarding bills from these companies.  You will be contacted with the lab results as soon as they are available. The fastest way to get your results is to activate your My Chart account. Instructions are located on the last page of this paperwork. If you have not heard from [place] regarding the results in 2 weeks, please contact this office.

## 2018-01-23 NOTE — Progress Notes (Signed)
Emily [name]  MRN: [id number] DOB: May 02, 1956  PCP: Dorise Hiss, PA-C  Subjective:  Pt is a 62 year old female who presents to clinic for low back pain. Spanish interpreter used to #[id number] She was here for this problem 1/28 for this same problem and saw colleague Dr. Kaleen Mask she was worked up from chest pain. EKG was normal. Chest x-ray was negative. [facility] note written. CT chest done 01/20/2018. She is here today for the CT results.  She works as a Chiropodist at [employer].   Today she states pain is a little bit better. Pain is constant. Also present at her back. Hurts worse with wearing a bra, laying down walking. Hurts worse pressing on it. Denies cough, hemoptysis, shob, diaphoresis.Marland Kitchen  MOI: she was walking through a very narrow, tight space at her work "and squeezed myself".   H/o anxiety - She is requesting refill of Xanax.   Review of Systems  Respiratory: Negative for cough, chest tightness, shortness of breath and wheezing.   Cardiovascular: Positive for chest pain. Negative for palpitations.  Gastrointestinal: Negative for nausea and vomiting.  Skin: Negative.     Patient Active Problem List   Diagnosis Date Noted  . Hypotension due to drugs 11/27/2017  . Bruit 11/27/2017  . Generalized abdominal pain 11/21/2017  . Right lower quadrant abdominal tenderness without rebound tenderness 11/21/2017  . General weakness 11/01/2017  . Viral illness 11/01/2017  . Microcytic red blood cells 05/17/2016  . GERD (gastroesophageal reflux disease) 04/12/2016  . Black stools 04/11/2016  . Right upper quadrant abdominal tenderness without rebound tenderness 11/02/2015  . Abdominal pain, chronic, epigastric 11/02/2015  . Abdominal bloating 11/02/2015  . Chronic high back pain 09/24/2015  . Anxiety 09/08/2014  . HTN (hypertension) 10/03/2012  . Hyperlipidemia 10/03/2012  . DYSPEPSIA&OTHER [facility] DISORDERS FUNCTION STOMACH 07/20/2009    Current Outpatient  Medications on File Prior to Visit  Medication Sig Dispense Refill  . ALPRAZolam (XANAX) 0.25 MG tablet TAKE 1 TABLET BY MOUTH THREE TIMES DAILY AS NEEDED FOR ANXIETY 30 tablet 0  . CALCIUM PO Take 1 tablet by mouth daily.     Marland Kitchen esomeprazole (NEXIUM) 40 MG capsule Take 1 capsule (40 mg total) daily at 12 noon by mouth. (Patient not taking: Reported on 01/23/2018) 90 capsule 3  . metoprolol succinate (TOPROL-XL) 25 MG 24 hr tablet Take 0.5 tablets (12.5 mg total) by mouth daily. Take at lunch or in the afternoon (Patient not taking: Reported on 01/23/2018) 30 tablet 6  . NIFEdipine (PROCARDIA-XL/ADALAT CC) 30 MG 24 hr tablet Take 1 tablet (30 mg total) by mouth daily. 30 tablet 0  . rosuvastatin (CRESTOR) 20 MG tablet Take 1 tablet (20 mg total) daily by mouth. (Patient not taking: Reported on 01/23/2018) 90 tablet 3   No current facility-administered medications on file prior to visit.     Allergies  Allergen Reactions  . Aspirin Hives  . Atorvastatin     Yellow fingernails  . Nsaids Hives     Objective:  BP 123/74 (BP Location: Left Arm, Patient Position: Sitting, Cuff Size: Normal)   Pulse 71   Temp 98.3 F (36.8 C) (Oral)   Ht 5' 3.78" (1.62 m)   Wt 184 lb (83.5 kg)   SpO2 91%   BMI 31.80 kg/m   Physical Exam  Constitutional: She is oriented to person, place, and time and well-developed, well-nourished, and in no distress. No distress.  Cardiovascular: Normal rate, regular rhythm and normal heart sounds.  Pulmonary/Chest:    TTP central chest. No deformities, bruising, crepitus.   Neurological: She is alert and oriented to person, place, and time. GCS score is 15.  Skin: Skin is warm and dry.  Psychiatric: Mood, memory, affect and judgment normal.  Vitals reviewed.   IMPRESSION: 1. No evidence of significant acute traumatic injury to the thorax. 2. Two tiny 4 mm pulmonary nodules in the left upper lobe, unchanged compared to prior study 08/11/2014, considered  definitively benign requiring no future imaging followup. 3. Small hiatal hernia. Assessment and Plan :  1. Acute bilateral back pain, unspecified back location - POCT urinalysis dipstick - Urine Culture - POCT Microscopic Urinalysis ([facility])  2. Chest pain, unspecified type - cyclobenzaprine (FLEXERIL) 5 MG tablet; Take 1 tablet (5 mg total) by mouth 3 (three) times daily as needed for muscle spasms.  Dispense: 30 tablet; Refill: 1 - Pt seen here 1/28 for severe chest pain s/p injury at work. CT chest was negative. Today she states she is improving. She appears well today. Advised pt to not wear bra for the next week, start Flexeril. RTC if condition worsens. This case was discussed with Dr. Nolon Rod.  3. Situational anxiety - ALPRAZolam (XANAX) 0.25 MG tablet; Take 1 tablet (0.25 mg total) by mouth 3 (three) times daily as needed. for anxiety  Dispense: 90 tablet; Refill: 0   Mercer Pod, PA-C  [facility] [place] 01/23/2018 4:34 PM

## 2018-01-25 LAB — URINE CULTURE

## 2018-01-27 NOTE — Telephone Encounter (Signed)
Left message for [facility] to call back to discuss this ct scan

## 2018-01-29 ENCOUNTER — Telehealth: Payer: Self-pay | Admitting: Family Medicine

## 2018-01-29 NOTE — Telephone Encounter (Signed)
Received call from Peter Congo, an appeal nurse with [facility]. She stated provider will need to call the [facility] health plan and request an appeal to get this retro-authorization, that [facility] is unable to do it after scan has already been done. The number is [phone].

## 2018-01-30 NOTE — Telephone Encounter (Signed)
Called and left message for to open up a peer to peer MEMBER ID [id number]  Left message for call back on this providers mobile phone

## 2018-01-31 ENCOUNTER — Ambulatory Visit: Payer: BLUE CROSS/BLUE SHIELD | Admitting: Gastroenterology

## 2018-02-15 ENCOUNTER — Other Ambulatory Visit: Payer: Self-pay | Admitting: Emergency Medicine

## 2018-02-15 DIAGNOSIS — I10 Essential (primary) hypertension: Secondary | ICD-10-CM

## 2018-02-17 NOTE — Telephone Encounter (Signed)
Attempted to contact pt regarding prescription refill request; left message at [phone], also contacted pt's son, Legrand Como, at [phone] who scheduled appointment on 02/28/18 at [place] with [place]; he verbalizes understanding.

## 2018-02-18 ENCOUNTER — Ambulatory Visit: Payer: Self-pay | Admitting: Physician Assistant

## 2018-02-28 ENCOUNTER — Ambulatory Visit: Payer: Self-pay | Admitting: Physician Assistant

## 2018-03-25 ENCOUNTER — Ambulatory Visit: Payer: BLUE CROSS/BLUE SHIELD | Admitting: Physician Assistant

## 2018-03-25 ENCOUNTER — Ambulatory Visit (INDEPENDENT_AMBULATORY_CARE_PROVIDER_SITE_OTHER): Payer: BLUE CROSS/BLUE SHIELD | Admitting: Emergency Medicine

## 2018-03-25 ENCOUNTER — Encounter: Payer: Self-pay | Admitting: Emergency Medicine

## 2018-03-25 ENCOUNTER — Other Ambulatory Visit: Payer: Self-pay

## 2018-03-25 VITALS — BP 103/66 | HR 66 | Temp 98.8°F | Resp 16 | Wt 190.8 lb

## 2018-03-25 DIAGNOSIS — I6523 Occlusion and stenosis of bilateral carotid arteries: Secondary | ICD-10-CM | POA: Insufficient documentation

## 2018-03-25 DIAGNOSIS — Z1239 Encounter for other screening for malignant neoplasm of breast: Secondary | ICD-10-CM | POA: Insufficient documentation

## 2018-03-25 DIAGNOSIS — Z1231 Encounter for screening mammogram for malignant neoplasm of breast: Secondary | ICD-10-CM

## 2018-03-25 DIAGNOSIS — E785 Hyperlipidemia, unspecified: Secondary | ICD-10-CM | POA: Insufficient documentation

## 2018-03-25 DIAGNOSIS — N949 Unspecified condition associated with female genital organs and menstrual cycle: Secondary | ICD-10-CM

## 2018-03-25 DIAGNOSIS — I10 Essential (primary) hypertension: Secondary | ICD-10-CM

## 2018-03-25 MED ORDER — ROSUVASTATIN CALCIUM 20 MG PO TABS: 20.0000 mg | ORAL_TABLET | Freq: Every day | ORAL | 3 refills | Status: DC

## 2018-03-25 MED ORDER — NIFEDIPINE ER 30 MG PO TB24: 30.0000 mg | ORAL_TABLET | Freq: Every day | ORAL | 3 refills | Status: DC

## 2018-03-25 MED ORDER — METOPROLOL SUCCINATE ER 25 MG PO TB24: 12.5000 mg | ORAL_TABLET | Freq: Every day | ORAL | 3 refills | Status: DC

## 2018-03-25 NOTE — Progress Notes (Signed)
Emily [name] 62 y.o.   Chief Complaint  Patient presents with  . Medication Refill    metoprolol succinate, nifedipine SR and rosuvastatin    HISTORY OF PRESENT ILLNESS: This is a 62 y.o. female with a history of hypertension here for follow-up.  On medications.  Needs refills of medications, doing well.  Compliant with medications.  No medical concerns at this point.  HPI   Prior to Admission medications   Medication Sig Start Date End Date Taking? Authorizing Provider  ALPRAZolam (XANAX) 0.25 MG tablet Take 1 tablet (0.25 mg total) by mouth 3 (three) times daily as needed. for anxiety 01/23/18  Yes McVey, Gelene Mink, PA-C  CALCIUM PO Take 1 tablet by mouth daily.    Yes [provider]  cyclobenzaprine (FLEXERIL) 5 MG tablet Take 1 tablet (5 mg total) by mouth 3 (three) times daily as needed for muscle spasms. 01/23/18  Yes McVey, Gelene Mink, PA-C  esomeprazole (NEXIUM) 40 MG capsule Take 1 capsule (40 mg total) daily at 12 noon by mouth. 11/01/17  Yes Delta Pichon, Ines Bloomer, MD  metoprolol succinate (TOPROL-XL) 25 MG 24 hr tablet Take 0.5 tablets (12.5 mg total) by mouth daily. Take at lunch or in the afternoon 01/06/18  Yes Barrett, Evelene Croon, PA-C  NIFEdipine (PROCARDIA-XL/ADALAT CC) 30 MG 24 hr tablet TAKE 1 TABLET BY MOUTH DAILY 02/17/18  Yes McVey, Gelene Mink, PA-C  rosuvastatin (CRESTOR) 20 MG tablet Take 1 tablet (20 mg total) daily by mouth. 11/01/17  Yes Horald Pollen, MD    Allergies  Allergen Reactions  . Aspirin Hives  . Atorvastatin     Yellow fingernails  . Nsaids Hives    Patient Active Problem List   Diagnosis Date Noted  . Hypotension due to drugs 11/27/2017  . Bruit 11/27/2017  . Generalized abdominal pain 11/21/2017  . Right lower quadrant abdominal tenderness without rebound tenderness 11/21/2017  . General weakness 11/01/2017  . Viral illness 11/01/2017  . Microcytic red blood cells 05/17/2016  . GERD  (gastroesophageal reflux disease) 04/12/2016  . Black stools 04/11/2016  . Right upper quadrant abdominal tenderness without rebound tenderness 11/02/2015  . Abdominal pain, chronic, epigastric 11/02/2015  . Abdominal bloating 11/02/2015  . Chronic high back pain 09/24/2015  . Anxiety 09/08/2014  . HTN (hypertension) 10/03/2012  . Hyperlipidemia 10/03/2012  . DYSPEPSIA&OTHER [facility] DISORDERS FUNCTION STOMACH 07/20/2009    Past Medical History:  Diagnosis Date  . Abdominal pain, chronic, epigastric 11/02/2015   Started amitriptyline at hospitalization 04/13/2016 EGD x 2 negative Also w/ chronic chest wall and back pain Would try not to work up further   . Anxiety   . Arthritis   . Back pain   . Beta thalassemia minor    diagnosed by hematology Dr. Jennette Kettle, also with sickle cell trait  . Chronic high back pain 09/24/2015  . Colon polyps   . GERD (gastroesophageal reflux disease)   . Hyperlipidemia   . Hypertension   . Sickle cell trait ([place])    diagnosed by hematology Dr. Lindi Adie, coexsisting with beta-thalassemia minor    Past Surgical History:  Procedure Laterality Date  . ABDOMINAL HYSTERECTOMY    . COLONOSCOPY    . ESOPHAGOGASTRODUODENOSCOPY    . ESOPHAGOGASTRODUODENOSCOPY [name] 04/13/2016   Procedure: ESOPHAGOGASTRODUODENOSCOPY (EGD);  Surgeon: Gatha Mayer, MD;  Location: [facility];  Service: Endoscopy;  Laterality: [name];    Social History   Socioeconomic History  . Marital status: Married    Spouse name: Not  on file  . Number of children: 3  . Years of education: Not on file  . Highest education level: Not on file  Occupational History  . Not on file  Social Needs  . Financial resource strain: Not on file  . Food insecurity:    Worry: Not on file    Inability: Not on file  . Transportation needs:    Medical: Not on file    Non-medical: Not on file  Tobacco Use  . Smoking status: Never Smoker  . Smokeless tobacco: Never Used  Substance and Sexual Activity    . Alcohol use: No    Alcohol/week: 0.0 oz  . Drug use: No  . Sexual activity: Not on file  Lifestyle  . Physical activity:    Days per week: Not on file    Minutes per session: Not on file  . Stress: Not on file  Relationships  . Social connections:    Talks on phone: Not on file    Gets together: Not on file    Attends religious service: Not on file    Active member of club or organization: Not on file    Attends meetings of clubs or organizations: Not on file    Relationship status: Not on file  . Intimate partner violence:    Fear of current or ex partner: Not on file    Emotionally abused: Not on file    Physically abused: Not on file    Forced sexual activity: Not on file  Other Topics Concern  . Not on file  Social History Narrative   Lives with husband and works at [employer] History  Problem Relation Age of Onset  . Aneurysm Mother   . Diabetes Father   . Colon cancer Brother 85  . Liver cancer Brother        mets from colon  . Esophageal cancer Neg Hx   . Stomach cancer Neg Hx   . Pancreatic cancer Neg Hx      Review of Systems  Constitutional: Negative.  Negative for chills, fever and weight loss.  HENT: Negative.  Negative for congestion, nosebleeds and sore throat.   Eyes: Negative.  Negative for blurred vision and double vision.  Respiratory: Negative.  Negative for cough and shortness of breath.   Cardiovascular: Negative.  Negative for chest pain, palpitations and leg swelling.  Gastrointestinal: Negative.  Negative for abdominal pain, diarrhea, nausea and vomiting.  Genitourinary: Negative.  Negative for hematuria.  Musculoskeletal: Negative.  Negative for back pain, myalgias and neck pain.  Skin: Negative.  Negative for rash.  Neurological: Negative.  Negative for dizziness and headaches.  Endo/Heme/Allergies: Negative.   All other systems reviewed and are negative.   Vitals:   03/25/18 1603  BP: 103/66  Pulse: 66  Resp: 16   Temp: 98.8 F (37.1 C)  SpO2: 97%    Physical Exam  Constitutional: She is oriented to person, place, and time. She appears well-developed and well-nourished.  HENT:  Head: Normocephalic and atraumatic.  Right Ear: External ear normal.  Left Ear: External ear normal.  Nose: Nose normal.  Mouth/Throat: Oropharynx is clear and moist.  Eyes: Pupils are equal, round, and reactive to light. Conjunctivae are normal.  Neck: Normal range of motion. Neck supple.  Cardiovascular: Normal rate, regular rhythm and normal heart sounds.  Pulmonary/Chest: Effort normal and breath sounds normal.  Abdominal: Soft. There is no tenderness.  Musculoskeletal: Normal range of motion. She exhibits no  edema or tenderness.  Lymphadenopathy:    She has no cervical adenopathy.  Neurological: She is alert and oriented to person, place, and time. No sensory deficit. She exhibits normal muscle tone.  Skin: Skin is warm and dry. Capillary refill takes less than 2 seconds. No pallor.  Psychiatric: She has a normal mood and affect. Her behavior is normal.  Vitals reviewed.  A total of 25 minutes was spent in the room with the patient, greater than 50% of which was in counseling/coordination of care.   ASSESSMENT & PLAN: [place] was seen today for medication refill.  Diagnoses and all orders for this visit:  Gynecological complaint -     Ambulatory referral to Gynecology  Essential hypertension -     NIFEdipine (PROCARDIA-XL/ADALAT CC) 30 MG 24 hr tablet; Take 1 tablet (30 mg total) by mouth daily. -     metoprolol succinate (TOPROL-XL) 25 MG 24 hr tablet; Take 0.5 tablets (12.5 mg total) by mouth daily. Take at lunch or in the afternoon  Bilateral carotid artery stenosis, R- 40-59%, L 1-39% 11/2017 -     metoprolol succinate (TOPROL-XL) 25 MG 24 hr tablet; Take 0.5 tablets (12.5 mg total) by mouth daily. Take at lunch or in the afternoon  Screening for breast cancer -     MM Digital Screening;  Future  Dyslipidemia  Other orders -     rosuvastatin (CRESTOR) 20 MG tablet; Take 1 tablet (20 mg total) by mouth daily.    Patient Instructions       IF you received an x-ray today, you will receive an invoice from [facility]. Please contact [facility] at [phone] with questions or concerns regarding your invoice.   IF you received labwork today, you will receive an invoice from [facility]. Please contact [facility] at [phone] with questions or concerns regarding your invoice.   Our billing staff will not be able to assist you with questions regarding bills from these companies.  You will be contacted with the lab results as soon as they are available. The fastest way to get your results is to activate your My Chart account. Instructions are located on the last page of this paperwork. If you have not heard from [place] regarding the results in 2 weeks, please contact this office.     Hipertensin Hypertension El trmino hipertensin es otra forma de denominar a la presin arterial elevada. La presin arterial elevada fuerza al corazn a trabajar ms para bombear la sangre. Esto puede causar problemas con el paso del [place]. Una lectura de presin arterial est compuesta por 2 nmeros. Hay un nmero superior (sistlico) sobre un nmero inferior (diastlico). Lo ideal es tener la presin arterial por debajo de 120/80. Las decisiones saludables pueden ayudarle a disminuir su presin arterial. Es posible que necesite medicamentos que le ayuden a disminuir su presin arterial si:  Su presin arterial no disminuye mediante decisiones saludables.  Su presin arterial est por encima de 130/80.  Siga estas instrucciones en su casa: Comida y bebida  Si se lo indican, siga el plan de alimentacin de DASH (Dietary Approaches to Stop Hypertension, Maneras de alimentarse para detener la hipertensin). Esta dieta incluye: ? Que la mitad del plato de cada comida sea de  frutas y verduras. ? Que un cuarto del plato de cada comida sea de cereales integrales. Los cereales integrales incluyen pasta integral, arroz integral y pan integral. ? Comer y beber productos lcteos con bajo contenido de [place], como leche descremada o yogur bajo  en grasas. ? Que un cuarto del plato de cada comida sea de protenas bajas en grasa (magras). Las protenas bajas en grasa incluyen pescado, pollo sin piel, huevos, frijoles y tofu. ? Evitar consumir carne grasa, carne curada y procesada, o pollo con piel. ? Evitar consumir alimentos prehechos o procesados.  Consuma menos de 1500 mg de sal (sodio) por da.  Limite el consumo de alcohol a no ms de 1 medida por da si es mujer y no est Music therapist y a 2 medidas por da si es hombre. Una medida equivale a 12onzas de cerveza, 5onzas de vino o 1onzas de bebidas alcohlicas de alta graduacin. Estilo de vida  Trabaje con su mdico para mantenerse en un peso saludable o para perder peso. Pregntele a su mdico cul es el peso recomendable para usted.  Realice al menos 30 minutos de ejercicio que haga que se acelere su corazn (ejercicio Arboriculturist) la [employer] de la [place]. Estos pueden incluir caminar, nadar o andar en bicicleta.  Realice al menos 30 minutos de ejercicio que fortalezca sus msculos (ejercicios de resistencia) al menos 3 das a la [place]. Estos pueden incluir levantar pesas o hacer pilates.  No consuma ningn producto que contenga nicotina o tabaco. Esto incluye cigarrillos y cigarrillos electrnicos. Si necesita ayuda para dejar de fumar, consulte al [employer].  Controle su presin arterial en su casa tal como le indic el mdico.  Concurra a todas las visitas de control como se lo haya indicado el mdico. Esto es importante. Medicamentos  [employer] de venta libre y los recetados solamente como se lo haya indicado el mdico. Siga cuidadosamente las indicaciones.  No omita las dosis de  medicamentos para la presin arterial. Los medicamentos pierden eficacia si omite dosis. El hecho de omitir las dosis tambin [place] el riesgo de otros problemas.  Pregntele a su mdico a qu efectos secundarios o reacciones a los Careers information officer. Comunquese con un mdico si:  Piensa que tiene [place] reaccin a los medicamentos que est tomando.  Tiene dolores de cabeza frecuentes (recurrentes).  Siente mareos.  Tiene hinchazn en los tobillos.  Tiene problemas de visin. Solicite ayuda de inmediato si:  Siente un dolor de cabeza muy intenso.  Comienza a sentirse confundido.  Se siente dbil o adormecido.  Siente que va a desmayarse.  Siente un dolor muy intenso en: ? El pecho. ? El vientre (abdomen).  Devuelve (vomita) ms de una vez.  Tiene dificultad para respirar. Resumen  El trmino hipertensin es otra forma de denominar a la presin arterial elevada.  Las decisiones saludables pueden ayudarle a disminuir su presin arterial. Si no puede controlar su presin arterial mediante decisiones saludables, es posible que deba tomar medicamentos. Esta informacin no tiene Marine scientist el consejo del mdico. Asegrese de hacerle al mdico cualquier pregunta que tenga. Document Released: 05/30/2010 Document Revised: 11/21/2016 Document Reviewed: 11/21/2016 Elsevier Interactive Patient Education  2018 [employer].  Hypertension Hypertension, commonly called high blood pressure, is when the force of blood pumping through the arteries is too strong. The arteries are the blood vessels that carry blood from the heart throughout the body. Hypertension forces the heart to work harder to pump blood and may cause arteries to become narrow or stiff. Having untreated or uncontrolled hypertension can cause heart attacks, strokes, kidney disease, and other problems. A blood pressure reading consists of a higher number over a lower number. Ideally, your blood  pressure should be below 120/80. The  first ("top") number is called the systolic pressure. It is a measure of the pressure in your arteries as your heart beats. The second ("bottom") number is called the diastolic pressure. It is a measure of the pressure in your arteries as the heart relaxes. What are the causes? The cause of this condition is not known. What increases the risk? Some risk factors for high blood pressure are under your control. Others are not. Factors you can change  Smoking.  Having type 2 diabetes mellitus, high cholesterol, or both.  Not getting enough exercise or physical activity.  Being overweight.  Having too much fat, sugar, calories, or salt (sodium) in your diet.  Drinking too much alcohol. Factors that are difficult or impossible to change  Having chronic kidney disease.  Having a family history of high blood pressure.  Age. Risk increases with age.  Race. You may be at higher risk if you are African-American.  Gender. Men are at higher risk than women before age 71. After age 83, women are at higher risk than men.  Having obstructive sleep apnea.  Stress. What are the signs or symptoms? Extremely high blood pressure (hypertensive crisis) may cause:  Headache.  Anxiety.  Shortness of breath.  Nosebleed.  Nausea and vomiting.  Severe chest pain.  Jerky movements you cannot control (seizures).  How is this diagnosed? This condition is diagnosed by measuring your blood pressure while you are seated, with your arm resting on a surface. The cuff of the blood pressure monitor will be placed directly against the skin of your upper arm at the level of your heart. It should be measured at least twice using the same arm. Certain conditions can cause a difference in blood pressure between your right and left arms. Certain factors can cause blood pressure readings to be lower or higher than normal (elevated) for a short period of time:  When your  blood pressure is higher when you are in a health care provider's office than when you are at home, this is called white coat hypertension. Most people with this condition do not need medicines.  When your blood pressure is higher at home than when you are in a health care provider's office, this is called masked hypertension. Most people with this condition may need medicines to control blood pressure.  If you have a high blood pressure reading during one visit or you have normal blood pressure with other risk factors:  You may be asked to return on a different day to have your blood pressure checked again.  You may be asked to monitor your blood pressure at home for 1 week or longer.  If you are diagnosed with hypertension, you may have other blood or imaging tests to help your health care provider understand your overall risk for other conditions. How is this treated? This condition is treated by making healthy lifestyle changes, such as eating healthy foods, exercising more, and reducing your alcohol intake. Your health care provider may prescribe medicine if lifestyle changes are not enough to get your blood pressure under control, and if:  Your systolic blood pressure is above 130.  Your diastolic blood pressure is above 80.  Your personal target blood pressure may vary depending on your medical conditions, your age, and other factors. Follow these instructions at home: Eating and drinking  Eat a diet that is high in fiber and potassium, and low in sodium, added sugar, and fat. An example eating plan is called the DASH (Dietary  Approaches to Stop Hypertension) diet. To eat this way: ? Eat plenty of fresh fruits and vegetables. Try to fill half of your plate at each meal with fruits and vegetables. ? Eat whole grains, such as whole wheat pasta, brown rice, or whole grain bread. Fill about one quarter of your plate with whole grains. ? Eat or drink low-fat dairy products, such as skim  milk or low-fat yogurt. ? Avoid fatty cuts of meat, processed or cured meats, and poultry with skin. Fill about one quarter of your plate with lean proteins, such as fish, chicken without skin, beans, eggs, and tofu. ? Avoid premade and processed foods. These tend to be higher in sodium, added sugar, and fat.  Reduce your daily sodium intake. Most people with hypertension should eat less than 1,500 mg of sodium a day.  Limit alcohol intake to no more than 1 drink a day for nonpregnant women and 2 drinks a day for men. One drink equals 12 oz of beer, 5 oz of wine, or 1 oz of hard liquor. Lifestyle  Work with your health care provider to maintain a healthy body weight or to lose weight. Ask what an ideal weight is for you.  Get at least 30 minutes of exercise that causes your heart to beat faster (aerobic exercise) most days of the week. Activities may include walking, swimming, or biking.  Include exercise to strengthen your muscles (resistance exercise), such as pilates or lifting weights, as part of your weekly exercise routine. Try to do these types of exercises for 30 minutes at least 3 days a week.  Do not use any products that contain nicotine or tobacco, such as cigarettes and e-cigarettes. If you need help quitting, ask your health care provider.  Monitor your blood pressure at home as told by your health care provider.  Keep all follow-up visits as told by your health care provider. This is important. Medicines  Take over-the-counter and prescription medicines only as told by your health care provider. Follow directions carefully. Blood pressure medicines must be taken as prescribed.  Do not skip doses of blood pressure medicine. Doing this puts you at risk for problems and can make the medicine less effective.  Ask your health care provider about side effects or reactions to medicines that you should watch for. Contact a health care provider if:  You think you are having a  reaction to a medicine you are taking.  You have headaches that keep coming back (recurring).  You feel dizzy.  You have swelling in your ankles.  You have trouble with your vision. Get help right away if:  You develop a severe headache or confusion.  You have unusual weakness or numbness.  You feel faint.  You have severe pain in your chest or abdomen.  You vomit repeatedly.  You have trouble breathing. Summary  Hypertension is when the force of blood pumping through your arteries is too strong. If this condition is not controlled, it may put you at risk for serious complications.  Your personal target blood pressure may vary depending on your medical conditions, your age, and other factors. For most people, a normal blood pressure is less than 120/80.  Hypertension is treated with lifestyle changes, medicines, or a combination of both. Lifestyle changes include weight loss, eating a healthy, low-sodium diet, exercising more, and limiting alcohol. This information is not intended to replace advice given to you by your health care provider. Make sure you discuss any questions you have  with your health care provider. Document Released: 12/10/2005 Document Revised: 11/07/2016 Document Reviewed: 11/07/2016 Elsevier Interactive Patient Education  2018 Elsevier Inc.     Agustina Caroli, MD [facility]

## 2018-03-25 NOTE — Patient Instructions (Addendum)
   IF you received an x-ray today, you will receive an invoice from [facility]. Please contact [facility] at [phone] with questions or concerns regarding your invoice.   IF you received labwork today, you will receive an invoice from [facility]. Please contact [facility] at [phone] with questions or concerns regarding your invoice.   Our billing staff will not be able to assist you with questions regarding bills from these companies.  You will be contacted with the lab results as soon as they are available. The fastest way to get your results is to activate your My Chart account. Instructions are located on the last page of this paperwork. If you have not heard from us regarding the results in 2 weeks, please contact this office.     Hipertensin Hypertension El trmino hipertensin es otra forma de denominar a la presin arterial elevada. La presin arterial elevada fuerza al corazn a trabajar ms para bombear la sangre. Esto puede causar problemas con el paso del tiempo. Una lectura de presin arterial est compuesta por 2 nmeros. Hay un nmero superior (sistlico) sobre un nmero inferior (diastlico). Lo ideal es tener la presin arterial por debajo de 120/80. Las decisiones saludables pueden ayudarle a disminuir su presin arterial. Es posible que necesite medicamentos que le ayuden a disminuir su presin arterial si:  Su presin arterial no disminuye mediante decisiones saludables.  Su presin arterial est por encima de 130/80.  Siga estas instrucciones en su casa: Comida y bebida  Si se lo indican, siga el plan de alimentacin de DASH (Dietary Approaches to Stop Hypertension, Maneras de alimentarse para detener la hipertensin). Esta dieta incluye: ? Que la mitad del plato de cada comida sea de frutas y verduras. ? Que un cuarto del plato de cada comida sea de cereales integrales. Los cereales integrales incluyen pasta integral, arroz integral y pan  integral. ? Comer y beber productos lcteos con bajo contenido de grasa, como leche descremada o yogur bajo en grasas. ? Que un cuarto del plato de cada comida sea de protenas bajas en grasa (magras). Las protenas bajas en grasa incluyen pescado, pollo sin piel, huevos, frijoles y tofu. ? Evitar consumir carne grasa, carne curada y procesada, o pollo con piel. ? Evitar consumir alimentos prehechos o procesados.  Consuma menos de 1500 mg de sal (sodio) por da.  Limite el consumo de alcohol a no ms de 1 medida por da si es mujer y no est embarazada y a 2 medidas por da si es hombre. Una medida equivale a 12onzas de cerveza, 5onzas de vino o 1onzas de bebidas alcohlicas de alta graduacin. Estilo de vida  Trabaje con su mdico para mantenerse en un peso saludable o para perder peso. Pregntele a su mdico cul es el peso recomendable para usted.  Realice al menos 30 minutos de ejercicio que haga que se acelere su corazn (ejercicio aerbico) la mayora de los das de la semana. Estos pueden incluir caminar, nadar o andar en bicicleta.  Realice al menos 30 minutos de ejercicio que fortalezca sus msculos (ejercicios de resistencia) al menos 3 das a la semana. Estos pueden incluir levantar pesas o hacer pilates.  No consuma ningn producto que contenga nicotina o tabaco. Esto incluye cigarrillos y cigarrillos electrnicos. Si necesita ayuda para dejar de fumar, consulte al mdico.  Controle su presin arterial en su casa tal como le indic el mdico.  Concurra a todas las visitas de control como se lo haya indicado el mdico. Esto es   importante. Medicamentos  [employer] de venta libre y los recetados solamente como se lo haya indicado el mdico. Siga cuidadosamente las indicaciones.  No omita las dosis de medicamentos para la presin arterial. Los medicamentos pierden eficacia si omite dosis. El hecho de omitir las dosis tambin [place] el riesgo de otros  problemas.  Pregntele a su mdico a qu efectos secundarios o reacciones a los Careers information officer. Comunquese con un mdico si:  Piensa que tiene [place] reaccin a los medicamentos que est tomando.  Tiene dolores de cabeza frecuentes (recurrentes).  Siente mareos.  Tiene hinchazn en los tobillos.  Tiene problemas de visin. Solicite ayuda de inmediato si:  Siente un dolor de cabeza muy intenso.  Comienza a sentirse confundido.  Se siente dbil o adormecido.  Siente que va a desmayarse.  Siente un dolor muy intenso en: ? El pecho. ? El vientre (abdomen).  Devuelve (vomita) ms de una vez.  Tiene dificultad para respirar. Resumen  El trmino hipertensin es otra forma de denominar a la presin arterial elevada.  Las decisiones saludables pueden ayudarle a disminuir su presin arterial. Si no puede controlar su presin arterial mediante decisiones saludables, es posible que deba tomar medicamentos. Esta informacin no tiene Marine scientist el consejo del mdico. Asegrese de hacerle al mdico cualquier pregunta que tenga. Document Released: 05/30/2010 Document Revised: 11/21/2016 Document Reviewed: 11/21/2016 Elsevier Interactive Patient Education  2018 [employer].  Hypertension Hypertension, commonly called high blood pressure, is when the force of blood pumping through the arteries is too strong. The arteries are the blood vessels that carry blood from the heart throughout the body. Hypertension forces the heart to work harder to pump blood and may cause arteries to become narrow or stiff. Having untreated or uncontrolled hypertension can cause heart attacks, strokes, kidney disease, and other problems. A blood pressure reading consists of a higher number over a lower number. Ideally, your blood pressure should be below 120/80. The first ("top") number is called the systolic pressure. It is a measure of the pressure in your arteries as your heart  beats. The second ("bottom") number is called the diastolic pressure. It is a measure of the pressure in your arteries as the heart relaxes. What are the causes? The cause of this condition is not known. What increases the risk? Some risk factors for high blood pressure are under your control. Others are not. Factors you can change  Smoking.  Having type 2 diabetes mellitus, high cholesterol, or both.  Not getting enough exercise or physical activity.  Being overweight.  Having too much fat, sugar, calories, or salt (sodium) in your diet.  Drinking too much alcohol. Factors that are difficult or impossible to change  Having chronic kidney disease.  Having a family history of high blood pressure.  Age. Risk increases with age.  Race. You may be at higher risk if you are African-American.  Gender. Men are at higher risk than women before age 86. After age 42, women are at higher risk than men.  Having obstructive sleep apnea.  Stress. What are the signs or symptoms? Extremely high blood pressure (hypertensive crisis) may cause:  Headache.  Anxiety.  Shortness of breath.  Nosebleed.  Nausea and vomiting.  Severe chest pain.  Jerky movements you cannot control (seizures).  How is this diagnosed? This condition is diagnosed by measuring your blood pressure while you are seated, with your arm resting on a surface. The cuff of the blood pressure monitor will  be placed directly against the skin of your upper arm at the level of your heart. It should be measured at least twice using the same arm. Certain conditions can cause a difference in blood pressure between your right and left arms. Certain factors can cause blood pressure readings to be lower or higher than normal (elevated) for a short period of time:  When your blood pressure is higher when you are in a health care provider's office than when you are at home, this is called white coat hypertension. Most people  with this condition do not need medicines.  When your blood pressure is higher at home than when you are in a health care provider's office, this is called masked hypertension. Most people with this condition may need medicines to control blood pressure.  If you have a high blood pressure reading during one visit or you have normal blood pressure with other risk factors:  You may be asked to return on a different day to have your blood pressure checked again.  You may be asked to monitor your blood pressure at home for 1 week or longer.  If you are diagnosed with hypertension, you may have other blood or imaging tests to help your health care provider understand your overall risk for other conditions. How is this treated? This condition is treated by making healthy lifestyle changes, such as eating healthy foods, exercising more, and reducing your alcohol intake. Your health care provider may prescribe medicine if lifestyle changes are not enough to get your blood pressure under control, and if:  Your systolic blood pressure is above 130.  Your diastolic blood pressure is above 80.  Your personal target blood pressure may vary depending on your medical conditions, your age, and other factors. Follow these instructions at home: Eating and drinking  Eat a diet that is high in fiber and potassium, and low in sodium, added sugar, and fat. An example eating plan is called the DASH (Dietary Approaches to Stop Hypertension) diet. To eat this way: ? Eat plenty of fresh fruits and vegetables. Try to fill half of your plate at each meal with fruits and vegetables. ? Eat whole grains, such as whole wheat pasta, brown rice, or whole grain bread. Fill about one quarter of your plate with whole grains. ? Eat or drink low-fat dairy products, such as skim milk or low-fat yogurt. ? Avoid fatty cuts of meat, processed or cured meats, and poultry with skin. Fill about one quarter of your plate with lean  proteins, such as fish, chicken without skin, beans, eggs, and tofu. ? Avoid premade and processed foods. These tend to be higher in sodium, added sugar, and fat.  Reduce your daily sodium intake. Most people with hypertension should eat less than 1,500 mg of sodium a day.  Limit alcohol intake to no more than 1 drink a day for nonpregnant women and 2 drinks a day for men. One drink equals 12 oz of beer, 5 oz of wine, or 1 oz of hard liquor. Lifestyle  Work with your health care provider to maintain a healthy body weight or to lose weight. Ask what an ideal weight is for you.  Get at least 30 minutes of exercise that causes your heart to beat faster (aerobic exercise) most days of the week. Activities may include walking, swimming, or biking.  Include exercise to strengthen your muscles (resistance exercise), such as pilates or lifting weights, as part of your weekly exercise routine. Try to do  these types of exercises for 30 minutes at least 3 days a week.  Do not use any products that contain nicotine or tobacco, such as cigarettes and e-cigarettes. If you need help quitting, ask your health care provider.  Monitor your blood pressure at home as told by your health care provider.  Keep all follow-up visits as told by your health care provider. This is important. Medicines  Take over-the-counter and prescription medicines only as told by your health care provider. Follow directions carefully. Blood pressure medicines must be taken as prescribed.  Do not skip doses of blood pressure medicine. Doing this puts you at risk for problems and can make the medicine less effective.  Ask your health care provider about side effects or reactions to medicines that you should watch for. Contact a health care provider if:  You think you are having a reaction to a medicine you are taking.  You have headaches that keep coming back (recurring).  You feel dizzy.  You have swelling in your  ankles.  You have trouble with your vision. Get help right away if:  You develop a severe headache or confusion.  You have unusual weakness or numbness.  You feel faint.  You have severe pain in your chest or abdomen.  You vomit repeatedly.  You have trouble breathing. Summary  Hypertension is when the force of blood pumping through your arteries is too strong. If this condition is not controlled, it may put you at risk for serious complications.  Your personal target blood pressure may vary depending on your medical conditions, your age, and other factors. For most people, a normal blood pressure is less than 120/80.  Hypertension is treated with lifestyle changes, medicines, or a combination of both. Lifestyle changes include weight loss, eating a healthy, low-sodium diet, exercising more, and limiting alcohol. This information is not intended to replace advice given to you by your health care provider. Make sure you discuss any questions you have with your health care provider. Document Released: 12/10/2005 Document Revised: 11/07/2016 Document Reviewed: 11/07/2016 Elsevier Interactive Patient Education  [employer].

## 2018-05-30 ENCOUNTER — Encounter: Payer: Self-pay | Admitting: Physician Assistant

## 2018-05-30 ENCOUNTER — Ambulatory Visit: Payer: Self-pay

## 2018-05-30 ENCOUNTER — Other Ambulatory Visit: Payer: Self-pay

## 2018-05-30 ENCOUNTER — Ambulatory Visit: Payer: BLUE CROSS/BLUE SHIELD | Admitting: Physician Assistant

## 2018-05-30 VITALS — BP 120/66 | HR 71 | Temp 98.1°F | Resp 16 | Ht 64.0 in | Wt 186.0 lb

## 2018-05-30 DIAGNOSIS — R059 Cough, unspecified: Secondary | ICD-10-CM

## 2018-05-30 DIAGNOSIS — J069 Acute upper respiratory infection, unspecified: Secondary | ICD-10-CM | POA: Diagnosis not present

## 2018-05-30 DIAGNOSIS — R05 Cough: Secondary | ICD-10-CM | POA: Diagnosis not present

## 2018-05-30 DIAGNOSIS — I6523 Occlusion and stenosis of bilateral carotid arteries: Secondary | ICD-10-CM

## 2018-05-30 DIAGNOSIS — I10 Essential (primary) hypertension: Secondary | ICD-10-CM | POA: Diagnosis not present

## 2018-05-30 MED ORDER — AZITHROMYCIN 250 MG PO TABS: ORAL_TABLET | ORAL | 0 refills | Status: DC

## 2018-05-30 MED ORDER — HYDROCODONE-HOMATROPINE 5-1.5 MG/5ML PO SYRP: 5.0000 mL | ORAL_SOLUTION | Freq: Three times a day (TID) | ORAL | 0 refills | Status: DC | PRN

## 2018-05-30 MED ORDER — BENZONATATE 100 MG PO CAPS: 100.0000 mg | ORAL_CAPSULE | Freq: Three times a day (TID) | ORAL | 0 refills | Status: DC | PRN

## 2018-05-30 MED ORDER — ROSUVASTATIN CALCIUM 20 MG PO TABS: 20.0000 mg | ORAL_TABLET | Freq: Every day | ORAL | 3 refills | Status: DC

## 2018-05-30 MED ORDER — METOPROLOL SUCCINATE ER 25 MG PO TB24: 12.5000 mg | ORAL_TABLET | Freq: Every day | ORAL | 3 refills | Status: DC

## 2018-05-30 MED ORDER — NIFEDIPINE ER 30 MG PO TB24: 30.0000 mg | ORAL_TABLET | Freq: Every day | ORAL | 3 refills | Status: DC

## 2018-05-30 NOTE — Progress Notes (Signed)
Emily [name]  MRN: [id number] DOB: December 17, 1956  PCP: Dorise Hiss, PA-C  Subjective:  Pt is a 62 year old female who presents to clinic for illness x 2 weeks. Pt is here today with her husband. She speaks [place]. Mobile interpreter used today [phone] Sore throat, fever, cough, weakness, nausea and stomach pain.  She has been taking Mucinex.   She would like refill of HTN and statin medications.   Review of Systems  Constitutional: Positive for fatigue and fever. Negative for chills and diaphoresis.  HENT: Positive for congestion, rhinorrhea and sore throat. Negative for postnasal drip, sinus pressure and sinus pain.   Respiratory: Positive for cough. Negative for shortness of breath and wheezing.   Gastrointestinal: Positive for nausea. Negative for abdominal pain and vomiting.  Psychiatric/Behavioral: Negative for sleep disturbance.    Patient Active Problem List   Diagnosis Date Noted  . Bilateral carotid artery stenosis, R- 40-59%, L 1-39% 11/2017 03/25/2018  . Gynecological complaint 03/25/2018  . Screening for breast cancer 03/25/2018  . Dyslipidemia 03/25/2018  . Hypotension due to drugs 11/27/2017  . Bruit 11/27/2017  . Generalized abdominal pain 11/21/2017  . Right lower quadrant abdominal tenderness without rebound tenderness 11/21/2017  . General weakness 11/01/2017  . Viral illness 11/01/2017  . Microcytic red blood cells 05/17/2016  . GERD (gastroesophageal reflux disease) 04/12/2016  . Black stools 04/11/2016  . Right upper quadrant abdominal tenderness without rebound tenderness 11/02/2015  . Abdominal pain, chronic, epigastric 11/02/2015  . Abdominal bloating 11/02/2015  . Chronic high back pain 09/24/2015  . Anxiety 09/08/2014  . HTN (hypertension) 10/03/2012  . Hyperlipidemia 10/03/2012  . DYSPEPSIA&OTHER [facility] DISORDERS FUNCTION STOMACH 07/20/2009    Current Outpatient Medications on File Prior to Visit  Medication Sig Dispense  Refill  . ALPRAZolam (XANAX) 0.25 MG tablet Take 1 tablet (0.25 mg total) by mouth 3 (three) times daily as needed. for anxiety 90 tablet 0  . CALCIUM PO Take 1 tablet by mouth daily.     Marland Kitchen esomeprazole (NEXIUM) 40 MG capsule Take 1 capsule (40 mg total) daily at 12 noon by mouth. 90 capsule 3  . metoprolol succinate (TOPROL-XL) 25 MG 24 hr tablet Take 0.5 tablets (12.5 mg total) by mouth daily. Take at lunch or in the afternoon 45 tablet 3  . NIFEdipine (PROCARDIA-XL/ADALAT CC) 30 MG 24 hr tablet Take 1 tablet (30 mg total) by mouth daily. 90 tablet 3  . rosuvastatin (CRESTOR) 20 MG tablet Take 1 tablet (20 mg total) by mouth daily. 90 tablet 3  . cyclobenzaprine (FLEXERIL) 5 MG tablet Take 1 tablet (5 mg total) by mouth 3 (three) times daily as needed for muscle spasms. (Patient not taking: Reported on 05/30/2018) 30 tablet 1   No current facility-administered medications on file prior to visit.     Allergies  Allergen Reactions  . Aspirin Hives  . Atorvastatin     Yellow fingernails  . Nsaids Hives     Objective:  BP 120/66 (BP Location: Left Arm, Patient Position: Sitting, Cuff Size: Normal)   Pulse 71   Temp 98.1 F (36.7 C) (Oral)   Resp 16   Ht '5\' 4"'$  (1.626 m)   Wt 186 lb (84.4 kg)   SpO2 96%   BMI 31.93 kg/m   Physical Exam  Constitutional: She is oriented to person, place, and time. No distress.  HENT:  Right Ear: Tympanic membrane normal.  Left Ear: Tympanic membrane normal.  Nose: Mucosal edema present. No rhinorrhea.  Right sinus exhibits no maxillary sinus tenderness and no frontal sinus tenderness. Left sinus exhibits no maxillary sinus tenderness and no frontal sinus tenderness.  Mouth/Throat: Oropharynx is clear and moist and mucous membranes are normal.  Cardiovascular: Normal rate, regular rhythm and normal heart sounds.  Pulmonary/Chest: Effort normal. No respiratory distress. She has wheezes. She has no rales.  Neurological: She is alert and oriented to  person, place, and time.  Skin: Skin is warm and dry.  Psychiatric: Judgment normal.  Vitals reviewed.   Assessment and Plan :  1. Acute upper respiratory infection 2. Cough - plan to cover. RTC in 5-7 days if no improvement.   3. Essential hypertension - NIFEdipine (PROCARDIA-XL/ADALAT CC) 30 MG 24 hr tablet; Take 1 tablet (30 mg total) by mouth daily.  Dispense: 90 tablet; Refill: 3 - metoprolol succinate (TOPROL-XL) 25 MG 24 hr tablet; Take 0.5 tablets (12.5 mg total) by mouth daily. Take at lunch or in the afternoon  Dispense: 45 tablet; Refill: 3 - rosuvastatin (CRESTOR) 20 MG tablet; Take 1 tablet (20 mg total) by mouth daily.  Dispense: 90 tablet; Refill: 3 - CMP14+EGFR  4. Bilateral carotid artery stenosis, R- 40-59%, L 1-39% 11/2017 - metoprolol succinate (TOPROL-XL) 25 MG 24 hr tablet; Take 0.5 tablets (12.5 mg total) by mouth daily. Take at lunch or in the afternoon  Dispense: 45 tablet; Refill: 3 - Lipid panel   Mercer Pod, PA-C  [facility] 05/30/2018 5:30 PM

## 2018-05-30 NOTE — Telephone Encounter (Addendum)
Phone call from pt. with c/o cold symptoms over past week.  C/o sore throat, cough, intermittent shortness of breath with activity, chills, upper back pain, and a "little pain in left chest that comes and goes, since I have been coughing."  Also c/o gen. weakness and weakness in both legs, and intermittent abdominal pain.  C/o nausea; denied vomiting. Reported the symptoms started to improve and then worsened recently.    Answer Assessment - Initial Assessment Questions 1. ONSET: "When did the cough begin?"      Coughing, sore throat since last week; symptoms improved then worsened 2. SEVERITY: "How bad is the cough today?"      intermittent 3. RESPIRATORY DISTRESS: "Describe your breathing."     intermittent Short of breath  4. FEVER: "Do you have a fever?" If so, ask: "What is your temperature, how was it measured, and when did it start?"     c/o chills last night; denied chills at present.    5. HEMOPTYSIS: "Are you coughing up any blood?" If so ask: "How much?" (flecks, streaks, tablespoons, etc.)     Nonproductive  6. TREATMENT: "What have you done so far to treat the cough?" (e.g., meds, fluids, humidifier)     Taking Tylenol prn; Mucinex   7. CARDIAC HISTORY: "Do you have any history of heart disease?" (e.g., heart attack, congestive heart failure)     Stated no hx of Heart disease; diag. With heart murmur 8. LUNG HISTORY: "Do you have any history of lung disease?"  (e.g., pulmonary embolus, asthma, emphysema)     Denied asthma; COPD 9. PE RISK FACTORS: "Do you have a history of blood clots?" (or: recent major surgery, recent prolonged travel, bedridden )     Not asked 10. OTHER SYMPTOMS: "Do you have any other symptoms? (e.g., runny nose, wheezing, chest pain)       C/o generally weak, and weakness in legs; c/o intermittent pain in stomach; c/o nausea; no vomiting c/o "a little pain in left side of chest"/ rated at 4/10; denied chest pain at this time ; c/o upper back pain 11. TRAVEL:  "Have you traveled out of the country in the last month?" (e.g., travel history, exposures)      No  Protocols used: COUGH - ACUTE NON-PRODUCTIVE-A-AH

## 2018-05-30 NOTE — Telephone Encounter (Signed)
Call was interrupted due to fire alarm in office.  Attempted to call pt. Back to schedule an appt.  Via an interpreter, left voice message to have pt. Call back to [phone] to schedule an appt.  Reason for Disposition . [1] Fever returns after gone for over 24 hours AND [2] symptoms worse or not improved    Pt. C/o sore throat, cough, chills, since last week; see assessment for detailed symptoms.  Pt. Stated she was feeling better since onset, but symptoms are now getting worse.  Protocols used: COUGH - ACUTE NON-PRODUCTIVE-A-AH

## 2018-05-30 NOTE — Patient Instructions (Addendum)
- La azitromicina es un antibitico. Por favor tome el curso completo.  - Hycodan es un jarabe para la tos a base de hidrocodona. Esto te har adormecer y te ayudar a dormir. Por favor, tome esto como se indica. Toma esto por la noche  - Tessalon te ayudar con la tos Agricultural consultant. - Usted puede comprar las pastillas para la garganta Cepacol. - Continuar Mucinex segn sea necesario  - Mantente bien hidratado. Perderse de descanso. Lvese las manos a menudo  -Alimentos que pueden ayudar a Scientific laboratory technician recuperacin: miel, ajo, sopa de pollo, bayas de saco, t verde. -Suplementos que pueden ayudar a acelerar la recuperacin: vitamina C, zinc, extracto de saco, quercetina, ginseng, selenio. -Suplemento con prebiticos y probiticos.   - Para el dolor de garganta: Haga grgaras con 8 onzas de agua salada ( cucharadita de sal por 1 cuarto de galn de agua) tan a menudo como cada 1 o 2 horas para calmar su garganta. Or Grgaras lquido benadryl.  - Para el dolor de garganta intente usar un t a base de miel. Use 3 cucharaditas de miel con jugo exprimido de [employer]. Coloque los trozos de Insurance risk surveyor en 1 / 2-1 taza de agua y caliente [employer] parte superior de la estufa. Luego mezcle los ingredientes y repita cada 4 horas segn sea necesario.  Para ayudar a deshacerse de su tos, puede:  - [place] un humidificador en tu habitacin. Use un medicamento para la tos de venta libre o chupe gotas para la tos o caramelos duros Deja de fumar, si fumas  - Si tiene Set designer, [employer] a las que es alrgico (como el polen, el polvo, los animales o Building services engineer) Si tiene reflujo cido, su mdico o Patent examiner dirn qu cambios en el estilo de vida pueden ayudar a reducir los sntomas.  - Vuelve si no ests mejorando en 7-10 das.  ------------------  Azithromycin is an antibiotic. Please take the entire course.  Hycodan is a hydrocodone-based cough syrup. This will make you drowsy and help you  sleep. Please take this as directed.  Tessalon will help you with cough during the day.    Stay well hydrated. Get lost of rest. Wash your hands often.   -Foods that can help speed recovery: honey, garlic, chicken soup, elderberries, green tea.  -Supplements that can help speed recovery: vitamin C, zinc, elderberry extract, quercetin, ginseng, selenium -Supplement with prebiotics and probiotics:   Advil or ibuprofen for pain. Do not take Aspirin.  Drink enough water and fluids to keep your urine clear or pale yellow.  For sore throat: ? Gargle with 8 oz of salt water ( tsp of salt per 1 qt of water) as often as every 1-2 hours to soothe your throat.  Gargle liquid benadryl.  You can buy Cepacol throat lozenges.  For sore throat try using a honey-based tea. Use 3 teaspoons of honey with juice squeezed from half lemon. Place shaved pieces of ginger into 1/2-1 cup of water and warm over stove top. Then mix the ingredients and repeat every 4 hours as needed.  Cough Syrup Recipe: Sweet Lemon & Honey Thyme  Ingredients a handful of fresh thyme sprigs   1 pint of water (2 cups)  1/2 cup honey (raw is best, but regular will do)  1/2 lemon chopped Instructions 1. Place the lemon in the pint jar and cover with the honey. The honey will macerate the lemons and draw out liquids which taste so  delicious! 2. Meanwhile, toss the thyme leaves into a saucepan and cover them with the water. 3. Bring the water to a gentle simmer and reduce it to half, about a cup of tea. 4. When the tea is reduced and cooled a bit, strain the sprigs & leaves, add it into the pint jar and stir it well. 5. Give it a shake and use a spoonful as needed. 6. Store your homemade cough syrup in the refrigerator for about a month.  To help get rid of your cough, you can: ?Use a humidifier in your bedroom ?Use an over-the-counter cough medicine, or suck on cough drops or hard candy ?Stop smoking, if you smoke ?If you have  allergies, avoid the things you are allergic to (like pollen, dust, animals, or mold) If you have acid reflux, your doctor or nurse will tell you which lifestyle changes can help reduce symptoms.     IF you received an x-ray today, you will receive an invoice from [facility]. Please contact [facility] at [phone] with questions or concerns regarding your invoice.   IF you received labwork today, you will receive an invoice from [place]. Please contact [facility] at [phone] with questions or concerns regarding your invoice.   Our billing staff will not be able to assist you with questions regarding bills from these companies.  You will be contacted with the lab results as soon as they are available. The fastest way to get your results is to activate your My Chart account. Instructions are located on the last page of this paperwork. If you have not heard from [place] regarding the results in 2 weeks, please contact this office.

## 2018-05-31 LAB — CMP14+EGFR
ALT: 18 IU/L (ref 0–32)
AST: 22 IU/L (ref 0–40)
Albumin/Globulin Ratio: 1.5 (ref 1.2–2.2)
Albumin: 4.4 g/dL (ref 3.6–4.8)
Alkaline Phosphatase: 140 IU/L — ABNORMAL HIGH (ref 39–117)
BUN/Creatinine Ratio: 27 (ref 12–28)
BUN: 17 mg/dL (ref 8–27)
Bilirubin Total: <0.2 mg/dL (ref 0.0–1.2)
CO2: 23 mmol/L (ref 20–29)
Calcium: 9.6 mg/dL (ref 8.7–10.3)
Chloride: 103 mmol/L (ref 96–106)
Creatinine, Ser: 0.64 mg/dL (ref 0.57–1.00)
GFR calc Af Amer: 112 mL/min/{1.73_m2} (ref >59)
GFR calc non Af Amer: 97 mL/min/{1.73_m2} (ref >59)
Globulin, Total: 2.9 g/dL (ref 1.5–4.5)
Glucose: 87 mg/dL (ref 65–99)
Potassium: 4.2 mmol/L (ref 3.5–5.2)
Sodium: 141 mmol/L (ref 134–144)
Total Protein: 7.3 g/dL (ref 6.0–8.5)

## 2018-05-31 LAB — LIPID PANEL
Chol/HDL Ratio: 2.6 ratio (ref 0.0–4.4)
Cholesterol, Total: 143 mg/dL (ref 100–199)
HDL: 54 mg/dL (ref >39)
LDL Calculated: 74 mg/dL (ref 0–99)
Triglycerides: 76 mg/dL (ref 0–149)
VLDL Cholesterol Cal: 15 mg/dL (ref 5–40)

## 2018-07-15 ENCOUNTER — Ambulatory Visit: Payer: BLUE CROSS/BLUE SHIELD | Admitting: Physician Assistant

## 2018-07-16 ENCOUNTER — Encounter: Payer: Self-pay | Admitting: Physician Assistant

## 2018-07-16 ENCOUNTER — Ambulatory Visit (INDEPENDENT_AMBULATORY_CARE_PROVIDER_SITE_OTHER): Payer: BLUE CROSS/BLUE SHIELD

## 2018-07-16 ENCOUNTER — Other Ambulatory Visit: Payer: Self-pay

## 2018-07-16 ENCOUNTER — Ambulatory Visit: Payer: BLUE CROSS/BLUE SHIELD | Admitting: Physician Assistant

## 2018-07-16 VITALS — BP 110/74 | HR 74 | Temp 98.6°F | Resp 16 | Ht 64.25 in | Wt 189.6 lb

## 2018-07-16 DIAGNOSIS — M79674 Pain in right toe(s): Secondary | ICD-10-CM | POA: Diagnosis not present

## 2018-07-16 DIAGNOSIS — S92531A Displaced fracture of distal phalanx of right lesser toe(s), initial encounter for closed fracture: Secondary | ICD-10-CM | POA: Diagnosis not present

## 2018-07-16 NOTE — Progress Notes (Signed)
Emily [name]  MRN: [id number] DOB: 05/08/1956  PCP: Dorise Hiss, PA-C  Subjective:  Pt is a 62 year old female who presents to clinic for foot pain x 2 weeks. She speaks Spanish, mobile interpreter used today [phone] Kicked a piece of furniture in her room about two weeks ago. Pain of her pinky toe of her right foot. This past week swelling is worse, worse with walking. Resting makes it better. She has been taking Tylenol to feel better. She has been icing the area Never had an injury to this site before.   Review of Systems  Musculoskeletal: Positive for arthralgias, gait problem and joint swelling (5th toe).  Skin: Negative.   Neurological: Negative for weakness and numbness.    Patient Active Problem List   Diagnosis Date Noted  . Bilateral carotid artery stenosis, R- 40-59%, L 1-39% 11/2017 03/25/2018  . Gynecological complaint 03/25/2018  . Screening for breast cancer 03/25/2018  . Dyslipidemia 03/25/2018  . Hypotension due to drugs 11/27/2017  . Bruit 11/27/2017  . Generalized abdominal pain 11/21/2017  . Right lower quadrant abdominal tenderness without rebound tenderness 11/21/2017  . General weakness 11/01/2017  . Viral illness 11/01/2017  . Microcytic red blood cells 05/17/2016  . GERD (gastroesophageal reflux disease) 04/12/2016  . Black stools 04/11/2016  . Right upper quadrant abdominal tenderness without rebound tenderness 11/02/2015  . Abdominal pain, chronic, epigastric 11/02/2015  . Abdominal bloating 11/02/2015  . Chronic high back pain 09/24/2015  . Anxiety 09/08/2014  . HTN (hypertension) 10/03/2012  . Hyperlipidemia 10/03/2012  . DYSPEPSIA&OTHER [facility] DISORDERS FUNCTION STOMACH 07/20/2009    Current Outpatient Medications on File Prior to Visit  Medication Sig Dispense Refill  . ALPRAZolam (XANAX) 0.25 MG tablet Take 1 tablet (0.25 mg total) by mouth 3 (three) times daily as needed. for anxiety 90 tablet 0  . CALCIUM PO Take 1  tablet by mouth daily.     . cyclobenzaprine (FLEXERIL) 5 MG tablet Take 1 tablet (5 mg total) by mouth 3 (three) times daily as needed for muscle spasms. 30 tablet 1  . esomeprazole (NEXIUM) 40 MG capsule Take 1 capsule (40 mg total) daily at 12 noon by mouth. 90 capsule 3  . metoprolol succinate (TOPROL-XL) 25 MG 24 hr tablet Take 0.5 tablets (12.5 mg total) by mouth daily. Take at lunch or in the afternoon 45 tablet 3  . NIFEdipine (PROCARDIA-XL/ADALAT CC) 30 MG 24 hr tablet Take 1 tablet (30 mg total) by mouth daily. 90 tablet 3  . rosuvastatin (CRESTOR) 20 MG tablet Take 1 tablet (20 mg total) by mouth daily. 90 tablet 3   No current facility-administered medications on file prior to visit.     Allergies  Allergen Reactions  . Aspirin Hives  . Atorvastatin     Yellow fingernails  . Nsaids Hives     Objective:  BP 110/74 (BP Location: Left Arm, Patient Position: Sitting, Cuff Size: Normal)   Pulse 74   Temp 98.6 F (37 C) (Oral)   Resp 16   Ht 5' 4.25" (1.632 m)   Wt 189 lb 9.6 oz (86 kg)   SpO2 97%   BMI 32.29 kg/m   Physical Exam  Constitutional: She is oriented to person, place, and time. No distress.  Musculoskeletal:       Right foot: There is decreased range of motion and bony tenderness. There is no tenderness and no swelling.  Neurological: She is alert and oriented to person, place, and time.  Skin: Skin is warm and dry.  Psychiatric: Judgment normal.  Vitals reviewed.  Dg Toe 5th Right  Result Date: 07/16/2018 CLINICAL DATA:  Kicking injury to the fifth distal digit EXAM: RIGHT FIFTH TOE COMPARISON:  None. FINDINGS: The distal aspect of the fifth proximal phalanx is fractured with fragment showing mild dorsal displacement. No dislocation. IMPRESSION: Mildly displaced fifth proximal phalanx fracture extending to the interphalangeal joint. Electronically Signed   By: Monte Fantasia M.D.   On: 07/16/2018 10:07   Assessment and Plan :  1. Closed displaced  fracture of distal phalanx of lesser toe of right foot, initial encounter 2. Pain of toe of right foot -Patient presents complaining of right toe pain after a kicking injury 2 weeks ago.  X-ray today shows a mildly displaced fifth proximal phalanx fracture.  Buddy tape and postop shoe placed by CMA.  Advised ice, rest, elevation.  Follow-up in 2 weeks.  She agrees with plan - DG Toe 5th Right; Future   Mercer Pod, PA-C  [facility] [place] 07/16/2018 9:39 AM

## 2018-07-16 NOTE — Patient Instructions (Addendum)
Tienes una fractura en el dedo del dedo del el dote.  Vuelve en 1-2 semanas para el seguimiento.  Hielo y eleva tu dedo del dedo del [place]. Elevar el sitio de la fractura por encima del nivel del corazn.  No hielo durante ms de 20 minutos cada hora.  Contine usando un zapato de suela dura tanto como sea posible durante el tiempo de curacin.  Cinta de amigos en su lugar durante 3-6 semanas.  Tome Tylenol 500mg  cada 4 horas segn sea necesario para el dolor.  El tiempo total de curacin para un dedo del pie fracturado es de 4-6 semanas. Usted puede experimentar dolor hasta varios meses.  ---------------------------------------------------------------------------------------------- You have a fracture of your toe.  Come back in 1-2 weeks for follow-up.  Ice and elevate your toe daily. Elevate the fracture site above the level of the heart.  Do not ice for more than 20 min every hour.  Continue wearing a hard-sole shoe as much as possible during healing time.  Buddy tape in place for 3-6 weeks.  Take Tylenol 500mg  every 4 hours as needed for pain.  Total healing time for a fractured toe is 4-6 weeks. You may experience pain up to several months.  No f/u x-rays needed.    Cmo colocar un vendaje de inmovilizacin (How to [employer]) El vendaje de inmovilizacin se refiere a la unin de un dedo lesionado, de la mano o del pie, a un dedo vecino sin lesin. Esto protege el dedo lesionado y evita que se mueva mientras la lesin se [place]. Puede colocar un vendaje de inmovilizacin en un dedo si tiene Financial planner. El mdico puede colocar un vendaje de inmovilizacin si usted tiene un esguince, una luxacin o Doctor, hospital. Le pueden indicar que reemplace el vendaje segn sea necesario. RIESGOS Y COMPLICACIONES En general, la colocacin de un vendaje de inmovilizacin es segura. Sin embargo, pueden presentarse problemas, por ejemplo:  Lesin o infeccin en la  piel.  Reduccin del flujo sanguneo en el dedo de la mano o del pie.  Reaccin de la piel a la cinta del vendaje. No coloque un vendaje de inmovilizacin si tiene diabetes. No coloque un vendaje de inmovilizacin si sabe que tiene [place] a [employer] o a la [place]. [place] DE INMOVILIZACIN Antes de colocar el vendaje [place] de reducir Conservation officer, historic buildings y la hinchazn con reposo, hielo y elevacin del dedo afectado:  Evite las actividades que le causen Social research officer, government.  Cuando est sentado o acostado, levante (eleve) la mano o el pie por encima del nivel del corazn.  Si se lo indican, aplique hielo sobre la zona lesionada: ? Ponga el hielo en una bolsa plstica. ? Coloque una [employer] piel y la bolsa de hielo. ? Coloque el hielo durante 55minutos, 2 a 3veces por da. Procedimiento para la colocacin del vendaje de inmovilizacin  Limpie y seque el dedo como se lo haya indicado el mdico.  Coloque una gasa o un trozo de algodn entre el dedo lesionado y el dedo sano.  Utilice cinta para envolver ambos dedos de modo que el dedo lesionado quede adherido al dedo sano. ? La cinta debe estar ajustada pero no apretada. ? Asegrese de [employer] extremos de la cinta. ? Evite colocar la Multimedia programmer.  Cambie la cinta y la gasa o algodn como se lo haya indicado el mdico. Retire, y Engineer, manufacturing systems la cinta y la gasa o algodn  si se aflojan, gastan, ensucian o humedecen. Despus de Programmer, systems del vendaje de inmovilizacin  [employer] de venta libre y los recetados solamente como se lo haya indicado el mdico.  Reanude sus actividades normales como se lo haya indicado el mdico. Pregntele al mdico qu actividades son seguras para usted.  Controle la zona del vendaje y siempre quteselo si: ? El Holiday representative. ? Los dedos se tornan plidos o azulados. ? La piel se irrita. SOLICITE ATENCIN MDICA SI:  Tiene dolor, hinchazn o  hematomas que duren ms de [employer]  Tiene fiebre.  La piel est enrojecida, agrietada o irritada. SOLICITE ATENCIN MDICA DE INMEDIATO SI:  La zona lesionada est fra, entumecida o plida.  Tiene dolor intenso, hinchazn, hematomas o prdida de movimiento en el dedo.  La forma del dedo cambia (deformidad). Esta informacin no tiene Marine scientist el consejo del mdico. Asegrese de hacerle al mdico cualquier pregunta que tenga. Document Released: 12/10/2005 Document Revised: 04/02/2016 Document Reviewed: 05/04/2015 Elsevier Interactive Patient Education  2018 [employer].  IF you received an x-ray today, you will receive an invoice from [facility]. Please contact [facility] at [phone] with questions or concerns regarding your invoice.   IF you received labwork today, you will receive an invoice from [place]. Please contact [facility] at [phone] with questions or concerns regarding your invoice.   Our billing staff will not be able to assist you with questions regarding bills from these companies.  You will be contacted with the lab results as soon as they are available. The fastest way to get your results is to activate your My Chart account. Instructions are located on the last page of this paperwork. If you have not heard from [place] regarding the results in 2 weeks, please contact this office.

## 2018-07-29 ENCOUNTER — Encounter: Payer: Self-pay | Admitting: Emergency Medicine

## 2018-07-29 ENCOUNTER — Ambulatory Visit: Payer: BLUE CROSS/BLUE SHIELD | Admitting: Emergency Medicine

## 2018-07-29 ENCOUNTER — Other Ambulatory Visit: Payer: Self-pay

## 2018-07-29 VITALS — BP 91/59 | HR 71 | Temp 99.2°F | Resp 16 | Wt 190.6 lb

## 2018-07-29 DIAGNOSIS — S92531D Displaced fracture of distal phalanx of right lesser toe(s), subsequent encounter for fracture with routine healing: Secondary | ICD-10-CM

## 2018-07-29 NOTE — Progress Notes (Signed)
Emily [name] 62 y.o.   Chief Complaint  Patient presents with  . Foot Injury    right 5th small toe - follow up    HISTORY OF PRESENT ILLNESS: This is a 62 y.o. female here for follow-up of toe fracture sustained several weeks ago.  Seen here on 07/16/2018.  X-rays showed a nondisplaced fracture of proximal fifth phalanx of the right foot.  Healing well.  Has no complaints.  Wants to go back to work.  Needs a work note.  HPI   Prior to Admission medications   Medication Sig Start Date End Date Taking? Authorizing Provider  ALPRAZolam (XANAX) 0.25 MG tablet Take 1 tablet (0.25 mg total) by mouth 3 (three) times daily as needed. for anxiety 01/23/18  Yes McVey, Gelene Mink, PA-C  CALCIUM PO Take 1 tablet by mouth daily.    Yes [provider]  esomeprazole (NEXIUM) 40 MG capsule Take 1 capsule (40 mg total) daily at 12 noon by mouth. 11/01/17  Yes Deliyah Muckle, Ines Bloomer, MD  metoprolol succinate (TOPROL-XL) 25 MG 24 hr tablet Take 0.5 tablets (12.5 mg total) by mouth daily. Take at lunch or in the afternoon 05/30/18 08/28/18 Yes McVey, Gelene Mink, PA-C  NIFEdipine (PROCARDIA-XL/ADALAT CC) 30 MG 24 hr tablet Take 1 tablet (30 mg total) by mouth daily. 05/30/18 08/28/18 Yes McVey, Gelene Mink, PA-C  rosuvastatin (CRESTOR) 20 MG tablet Take 1 tablet (20 mg total) by mouth daily. 05/30/18  Yes McVey, Gelene Mink, PA-C  cyclobenzaprine (FLEXERIL) 5 MG tablet Take 1 tablet (5 mg total) by mouth 3 (three) times daily as needed for muscle spasms. Patient not taking: Reported on 07/29/2018 01/23/18   McVey, Gelene Mink, PA-C    Allergies  Allergen Reactions  . Aspirin Hives  . Atorvastatin     Yellow fingernails  . Nsaids Hives    Patient Active Problem List   Diagnosis Date Noted  . Bilateral carotid artery stenosis, R- 40-59%, L 1-39% 11/2017 03/25/2018  . Gynecological complaint 03/25/2018  . Screening for breast cancer 03/25/2018  . Dyslipidemia  03/25/2018  . Hypotension due to drugs 11/27/2017  . Bruit 11/27/2017  . Generalized abdominal pain 11/21/2017  . Right lower quadrant abdominal tenderness without rebound tenderness 11/21/2017  . General weakness 11/01/2017  . Viral illness 11/01/2017  . Microcytic red blood cells 05/17/2016  . GERD (gastroesophageal reflux disease) 04/12/2016  . Black stools 04/11/2016  . Right upper quadrant abdominal tenderness without rebound tenderness 11/02/2015  . Abdominal pain, chronic, epigastric 11/02/2015  . Abdominal bloating 11/02/2015  . Chronic high back pain 09/24/2015  . Anxiety 09/08/2014  . HTN (hypertension) 10/03/2012  . Hyperlipidemia 10/03/2012  . DYSPEPSIA&OTHER [facility] DISORDERS FUNCTION STOMACH 07/20/2009    Past Medical History:  Diagnosis Date  . Abdominal pain, chronic, epigastric 11/02/2015   Started amitriptyline at hospitalization 04/13/2016 EGD x 2 negative Also w/ chronic chest wall and back pain Would try not to work up further   . Anxiety   . Arthritis   . Back pain   . Beta thalassemia minor    diagnosed by hematology Dr. Jennette Kettle, also with sickle cell trait  . Chronic high back pain 09/24/2015  . Colon polyps   . GERD (gastroesophageal reflux disease)   . Hyperlipidemia   . Hypertension   . Sickle cell trait ([place])    diagnosed by hematology Dr. Lindi Adie, coexsisting with beta-thalassemia minor    Past Surgical History:  Procedure Laterality Date  . ABDOMINAL HYSTERECTOMY    .  COLONOSCOPY    . ESOPHAGOGASTRODUODENOSCOPY    . ESOPHAGOGASTRODUODENOSCOPY [name] 04/13/2016   Procedure: ESOPHAGOGASTRODUODENOSCOPY (EGD);  Surgeon: Gatha Mayer, MD;  Location: [facility];  Service: Endoscopy;  Laterality: [name];    Social History   Socioeconomic History  . Marital status: Married    Spouse name: Not on file  . Number of children: 3  . Years of education: Not on file  . Highest education level: Not on file  Occupational History  . Not on file  Social  Needs  . Financial resource strain: Not on file  . Food insecurity:    Worry: Not on file    Inability: Not on file  . Transportation needs:    Medical: Not on file    Non-medical: Not on file  Tobacco Use  . Smoking status: Never Smoker  . Smokeless tobacco: Never Used  Substance and Sexual Activity  . Alcohol use: No    Alcohol/week: 0.0 oz  . Drug use: No  . Sexual activity: Not on file  Lifestyle  . Physical activity:    Days per week: Not on file    Minutes per session: Not on file  . Stress: Not on file  Relationships  . Social connections:    Talks on phone: Not on file    Gets together: Not on file    Attends religious service: Not on file    Active member of club or organization: Not on file    Attends meetings of clubs or organizations: Not on file    Relationship status: Not on file  . Intimate partner violence:    Fear of current or ex partner: Not on file    Emotionally abused: Not on file    Physically abused: Not on file    Forced sexual activity: Not on file  Other Topics Concern  . Not on file  Social History Narrative   Lives with husband and works at [employer] History  Problem Relation Age of Onset  . Aneurysm Mother   . Diabetes Father   . Colon cancer Brother 42  . Liver cancer Brother        mets from colon  . Esophageal cancer Neg Hx   . Stomach cancer Neg Hx   . Pancreatic cancer Neg Hx      Review of Systems  Constitutional: Negative.  Negative for chills and fever.  Respiratory: Negative for cough.   Cardiovascular: Negative for chest pain.  Gastrointestinal: Negative for nausea and vomiting.  Skin: Negative.  Negative for rash.  Neurological: Negative.   Endo/Heme/Allergies: Negative.   All other systems reviewed and are negative.   Vitals:   07/29/18 1508  BP: (!) 91/59  Pulse: 71  Resp: 16  Temp: 99.2 F (37.3 C)  SpO2: 95%    Physical Exam  Constitutional: She is oriented to person, place, and time. She  appears well-developed and well-nourished.  HENT:  Head: Normocephalic.  Eyes: Pupils are equal, round, and reactive to light.  Neck: Normal range of motion.  Cardiovascular: Normal rate.  Pulmonary/Chest: Effort normal.  Musculoskeletal:  Right foot: Fifth toe: No bruising or erythema.  No swelling.  Full range of motion.  NVI.  Neurological: She is alert and oriented to person, place, and time.  Skin: Skin is warm. Capillary refill takes less than 2 seconds.  Psychiatric: She has a normal mood and affect. Her behavior is normal.  Vitals reviewed.    ASSESSMENT & PLAN: Rheya  was seen today for foot injury.  Diagnoses and all orders for this visit:  Closed displaced fracture of distal phalanx of lesser toe of right foot with routine healing, subsequent encounter    Patient Instructions       IF you received an x-ray today, you will receive an invoice from [facility]. Please contact [facility] at [phone] with questions or concerns regarding your invoice.   IF you received labwork today, you will receive an invoice from [facility]. Please contact [facility] at [phone] with questions or concerns regarding your invoice.   Our billing staff will not be able to assist you with questions regarding bills from these companies.  You will be contacted with the lab results as soon as they are available. The fastest way to get your results is to activate your My Chart account. Instructions are located on the last page of this paperwork. If you have not heard from [place] regarding the results in 2 weeks, please contact this office.     Toe Fracture A toe fracture is a break in one of the toe bones (phalanges). Follow these instructions at home: If you have a cast:  Do not stick anything inside the cast to scratch your skin.  Check the skin around the cast every day. Tell your doctor about any concerns. Do not put lotion on the skin underneath the cast. You may  put lotion on dry skin around the edges of the cast.  Do not put pressure on any part of the cast until it is fully hardened. This may take many hours.  Keep the cast clean and dry. Bathing  Do not take baths, swim, or use a hot tub until your doctor says that you can. Ask your doctor if you can take showers. You may only be allowed to take sponge baths for bathing.  If your doctor says that bathing and showering are okay, cover the cast or bandage (dressing) with a watertight plastic bag to protect it from water. Do not let the cast or bandage get wet. Managing pain, stiffness, and swelling  If you do not have a cast, put ice on the injured area if told by your doctor: ? Put ice in a plastic bag. ? Place a towel between your skin and the bag. ? Leave the ice on for 20 minutes, 2-3 times per day.  Move your toes often to avoid stiffness and to lessen swelling.  Raise (elevate) the injured area above the level of your heart while you are sitting or lying down. Driving  Do not drive or use heavy machinery while taking pain medicine.  Do not drive while wearing a cast on a foot that you use for driving. Activity  Return to your normal activities as told by your doctor. Ask your doctor what activities are safe for you.  Perform exercises daily as told by your doctor or therapist. Safety  Do not use your leg to support your body weight until your doctor says that you can. Use crutches or other tools to help you move around as told by your doctor. General instructions  If your toe was taped to a toe that is next to it (buddy taping), follow your doctor's instructions for changing the gauze and tape. Change it more often: ? If the gauze and tape get wet. If this happens, dry the space between the toes. ? If the gauze and tape are too tight and they cause your toe to become pale or to  lose feeling (numb).  Wear a protective shoe as told by your doctor. If you were not given one, wear  sturdy shoes that support your foot. Your shoes should not pinch your toes. Your shoes should not fit tightly against your toes.  Do not use any tobacco products, including cigarettes, chewing tobacco, or e-cigarettes. Tobacco can delay bone healing. If you need help quitting, ask your doctor.  Take medicines only as told by your doctor.  Keep all follow-up visits as told by your doctor. This is important. Contact a doctor if:  You have a fever.  Your pain medicine is not helping.  Your toe feels cold.  You lose feeling (have numbness) in your toe.  You still have pain after one week of rest and treatment.  You still have pain after your doctor has said that you can start walking again.  You have pain or tingling in your foot, and it is not going away.  You have loss of feeling in your foot, and it is not going away. Get help right away if:  You have severe pain.  You have redness or swelling (inflammation) in your toe, and it is getting worse.  You have pain or loss of feeling in your toe, and it is getting worse.  Your toe is blue. This information is not intended to replace advice given to you by your health care provider. Make sure you discuss any questions you have with your health care provider. Document Released: 05/28/2008 Document Revised: 08/13/2016 Document Reviewed: 10/06/2014 Elsevier Interactive Patient Education  2018 Elsevier Inc.      Agustina Caroli, MD [facility]

## 2018-07-29 NOTE — Patient Instructions (Addendum)
IF you received an x-ray today, you will receive an invoice from [facility]. Please contact [facility] at [phone] with questions or concerns regarding your invoice.   IF you received labwork today, you will receive an invoice from [facility]. Please contact [facility] at [phone] with questions or concerns regarding your invoice.   Our billing staff will not be able to assist you with questions regarding bills from these companies.  You will be contacted with the lab results as soon as they are available. The fastest way to get your results is to activate your My Chart account. Instructions are located on the last page of this paperwork. If you have not heard from [place] regarding the results in 2 weeks, please contact this office.     Toe Fracture A toe fracture is a break in one of the toe bones (phalanges). Follow these instructions at home: If you have a cast:  Do not stick anything inside the cast to scratch your skin.  Check the skin around the cast every day. Tell your doctor about any concerns. Do not put lotion on the skin underneath the cast. You may put lotion on dry skin around the edges of the cast.  Do not put pressure on any part of the cast until it is fully hardened. This may take many hours.  Keep the cast clean and dry. Bathing  Do not take baths, swim, or use a hot tub until your doctor says that you can. Ask your doctor if you can take showers. You may only be allowed to take sponge baths for bathing.  If your doctor says that bathing and showering are okay, cover the cast or bandage (dressing) with a watertight plastic bag to protect it from water. Do not let the cast or bandage get wet. Managing pain, stiffness, and swelling  If you do not have a cast, put ice on the injured area if told by your doctor: ? Put ice in a plastic bag. ? Place a towel between your skin and the bag. ? Leave the ice on for 20 minutes, 2-3 times per  day.  Move your toes often to avoid stiffness and to lessen swelling.  Raise (elevate) the injured area above the level of your heart while you are sitting or lying down. Driving  Do not drive or use heavy machinery while taking pain medicine.  Do not drive while wearing a cast on a foot that you use for driving. Activity  Return to your normal activities as told by your doctor. Ask your doctor what activities are safe for you.  Perform exercises daily as told by your doctor or therapist. Safety  Do not use your leg to support your body weight until your doctor says that you can. Use crutches or other tools to help you move around as told by your doctor. General instructions  If your toe was taped to a toe that is next to it (buddy taping), follow your doctor's instructions for changing the gauze and tape. Change it more often: ? If the gauze and tape get wet. If this happens, dry the space between the toes. ? If the gauze and tape are too tight and they cause your toe to become pale or to lose feeling (numb).  Wear a protective shoe as told by your doctor. If you were not given one, wear sturdy shoes that support your foot. Your shoes should not pinch your toes. Your shoes should not fit tightly against  your toes.  Do not use any tobacco products, including cigarettes, chewing tobacco, or e-cigarettes. Tobacco can delay bone healing. If you need help quitting, ask your doctor.  Take medicines only as told by your doctor.  Keep all follow-up visits as told by your doctor. This is important. Contact a doctor if:  You have a fever.  Your pain medicine is not helping.  Your toe feels cold.  You lose feeling (have numbness) in your toe.  You still have pain after one week of rest and treatment.  You still have pain after your doctor has said that you can start walking again.  You have pain or tingling in your foot, and it is not going away.  You have loss of feeling in your  foot, and it is not going away. Get help right away if:  You have severe pain.  You have redness or swelling (inflammation) in your toe, and it is getting worse.  You have pain or loss of feeling in your toe, and it is getting worse.  Your toe is blue. This information is not intended to replace advice given to you by your health care provider. Make sure you discuss any questions you have with your health care provider. Document Released: 05/28/2008 Document Revised: 08/13/2016 Document Reviewed: 10/06/2014 Elsevier Interactive Patient Education  [employer].

## 2018-07-30 ENCOUNTER — Ambulatory Visit: Payer: BLUE CROSS/BLUE SHIELD | Admitting: Physician Assistant

## 2018-08-29 ENCOUNTER — Ambulatory Visit: Payer: BLUE CROSS/BLUE SHIELD

## 2018-10-10 ENCOUNTER — Encounter: Payer: Self-pay | Admitting: Family Medicine

## 2018-10-10 ENCOUNTER — Other Ambulatory Visit: Payer: Self-pay

## 2018-10-10 ENCOUNTER — Ambulatory Visit: Payer: BLUE CROSS/BLUE SHIELD | Admitting: Family Medicine

## 2018-10-10 VITALS — BP 116/72 | HR 75 | Temp 98.2°F | Ht 66.0 in | Wt 192.4 lb

## 2018-10-10 DIAGNOSIS — R350 Frequency of micturition: Secondary | ICD-10-CM

## 2018-10-10 DIAGNOSIS — R109 Unspecified abdominal pain: Secondary | ICD-10-CM

## 2018-10-10 DIAGNOSIS — R1011 Right upper quadrant pain: Secondary | ICD-10-CM | POA: Diagnosis not present

## 2018-10-10 DIAGNOSIS — R3129 Other microscopic hematuria: Secondary | ICD-10-CM | POA: Diagnosis not present

## 2018-10-10 DIAGNOSIS — R1031 Right lower quadrant pain: Secondary | ICD-10-CM

## 2018-10-10 DIAGNOSIS — R110 Nausea: Secondary | ICD-10-CM

## 2018-10-10 LAB — POCT URINALYSIS DIP (MANUAL ENTRY)
BILIRUBIN UA: NEGATIVE
BILIRUBIN UA: NEGATIVE mg/dL
GLUCOSE UA: NEGATIVE mg/dL
Leukocytes, UA: NEGATIVE
NITRITE UA: NEGATIVE
Protein Ur, POC: NEGATIVE mg/dL
Spec Grav, UA: 1.015 (ref 1.010–1.025)
Urobilinogen, UA: 0.2 E.U./dL
pH, UA: 6 (ref 5.0–8.0)

## 2018-10-10 LAB — POCT CBC
Granulocyte percent: 66.2 %G (ref 37–80)
HEMATOCRIT: 38.1 % (ref 29–41)
HEMOGLOBIN: 12.3 g/dL (ref 9.5–13.5)
LYMPH, POC: 1.9 (ref 0.6–3.4)
MCH, POC: 24.9 pg — AB (ref 27–31.2)
MCHC: 32.2 g/dL (ref 31.8–35.4)
MCV: 77.4 fL (ref 76–111)
MID (CBC): 0.6 (ref 0–0.9)
MPV: 8.1 fL (ref 0–99.8)
POC GRANULOCYTE: 5 (ref 2–6.9)
POC LYMPH PERCENT: 25.3 %L (ref 10–50)
POC MID %: 8.5 % (ref 0–12)
Platelet Count, POC: 286 10*3/uL (ref 142–424)
RBC: 4.92 M/uL (ref 4.04–5.48)
RDW, POC: 14.4 %
WBC: 7.6 10*3/uL (ref 4.6–10.2)

## 2018-10-10 LAB — POC MICROSCOPIC URINALYSIS (UMFC): MUCUS RE: ABSENT

## 2018-10-10 MED ORDER — NITROFURANTOIN MONOHYD MACRO 100 MG PO CAPS: 100.0000 mg | ORAL_CAPSULE | Freq: Two times a day (BID) | ORAL | 0 refills | Status: DC

## 2018-10-10 NOTE — Patient Instructions (Addendum)
  macrobid por orina   voy a Scientist, research (life sciences).   va a cuarto de emergencia si empeoran.    Dolor abdominal en adultos Abdominal Pain, Adult El dolor abdominal puede tener muchas causas. A menudo, no es grave y [place] sin tratamiento o con tratamiento en la casa. Sin embargo, a Product/process development scientist abdominal es intenso. El mdico revisar sus antecedentes mdicos y le har un examen fsico para tratar de Office manager causa del dolor abdominal. Siga estas instrucciones en su casa:  Tome los medicamentos de venta libre y los recetados solamente como se lo haya indicado el mdico. No tome un laxante a menos que se lo haya indicado el mdico.  Beba suficiente lquido para Theatre manager la orina clara o de color amarillo plido.  Controle su afeccin para ver si hay cambios.  Concurra a todas las visitas de control como se lo haya indicado el mdico. Esto es importante. Comunquese con un mdico si:  El dolor abdominal cambia o empeora.  No tiene apetito o baja de peso sin proponrselo.  Est estreido o tiene diarrea durante ms de 2 o 3das.  Tiene dolor cuando orina o defeca.  El dolor abdominal lo despierta de noche.  El dolor empeora con las comidas, despus de comer o con determinados alimentos.  Tiene vmitos y no puede retener nada.  Tiene fiebre. Solicite ayuda de inmediato si:  El dolor no desaparece tan pronto como el mdico le dijo que era esperable.  No puede detener los vmitos.  El Social research officer, government se siente solo en zonas del abdomen, como el lado derecho o la parte inferior izquierda del abdomen.  Las heces son sanguinolentas o de color negro, o de aspecto alquitranado.  Tiene dolor intenso, clicos, o meteorismo en el abdomen.  Tiene signos de deshidratacin, por ejemplo: ? [place] Else, muy escasa o falta de [place]. ? Labios agrietados. ? [employer]. ? Ojos hundidos. ? Somnolencia. ? Debilidad. Esta informacin no tiene Marine scientist el consejo  del mdico. Asegrese de hacerle al mdico cualquier pregunta que tenga. Document Released: 12/10/2005 Document Revised: 11/29/2016 Document Reviewed: 05/23/2016 Elsevier Interactive Patient Education  [employer].   If you have lab work done today you will be contacted with your lab results within the next 2 weeks.  If you have not heard from [place] then please contact us. The fastest way to get your results is to register for My Chart.   IF you received an x-ray today, you will receive an invoice from [facility]. Please contact [facility] at [phone] with questions or concerns regarding your invoice.   IF you received labwork today, you will receive an invoice from [place]. Please contact [facility] at [phone] with questions or concerns regarding your invoice.   Our billing staff will not be able to assist you with questions regarding bills from these companies.  You will be contacted with the lab results as soon as they are available. The fastest way to get your results is to activate your My Chart account. Instructions are located on the last page of this paperwork. If you have not heard from [place] regarding the results in 2 weeks, please contact this office.

## 2018-10-10 NOTE — Progress Notes (Signed)
Shay   

## 2018-10-10 NOTE — Progress Notes (Signed)
Subjective:    Patient ID: Emily [name], female    DOB: November 24, 1956, 62 y.o.   MRN: [id number]  HPI Emily [name] is a 62 y.o. female Presents today for: Chief Complaint  Patient presents with  . Abdominal Pain    3 days    Started with R greater than mid lower abd pain past 3 days. Slight nausea, no vomiting. No change in pain with eating. Some belching and flatus. No fever. Urinating more frequently. Less sore after urinating. No new back pain.    Patient Active Problem List   Diagnosis Date Noted  . Bilateral carotid artery stenosis, R- 40-59%, L 1-39% 11/2017 03/25/2018  . Gynecological complaint 03/25/2018  . Screening for breast cancer 03/25/2018  . Dyslipidemia 03/25/2018  . Hypotension due to drugs 11/27/2017  . Bruit 11/27/2017  . Generalized abdominal pain 11/21/2017  . Right lower quadrant abdominal tenderness without rebound tenderness 11/21/2017  . General weakness 11/01/2017  . Viral illness 11/01/2017  . Microcytic red blood cells 05/17/2016  . GERD (gastroesophageal reflux disease) 04/12/2016  . Black stools 04/11/2016  . Right upper quadrant abdominal tenderness without rebound tenderness 11/02/2015  . Abdominal pain, chronic, epigastric 11/02/2015  . Abdominal bloating 11/02/2015  . Chronic high back pain 09/24/2015  . Anxiety 09/08/2014  . HTN (hypertension) 10/03/2012  . Hyperlipidemia 10/03/2012  . DYSPEPSIA&OTHER [facility] DISORDERS FUNCTION STOMACH 07/20/2009   Past Medical History:  Diagnosis Date  . Abdominal pain, chronic, epigastric 11/02/2015   Started amitriptyline at hospitalization 04/13/2016 EGD x 2 negative Also w/ chronic chest wall and back pain Would try not to work up further   . Anxiety   . Arthritis   . Back pain   . Beta thalassemia minor    diagnosed by hematology Dr. Jennette Kettle, also with sickle cell trait  . Chronic high back pain 09/24/2015  . Colon polyps   . GERD (gastroesophageal reflux disease)   .  Hyperlipidemia   . Hypertension   . Sickle cell trait ([place])    diagnosed by hematology Dr. Lindi Adie, coexsisting with beta-thalassemia minor   Past Surgical History:  Procedure Laterality Date  . ABDOMINAL HYSTERECTOMY    . COLONOSCOPY    . ESOPHAGOGASTRODUODENOSCOPY    . ESOPHAGOGASTRODUODENOSCOPY [name] 04/13/2016   Procedure: ESOPHAGOGASTRODUODENOSCOPY (EGD);  Surgeon: Gatha Mayer, MD;  Location: [facility];  Service: Endoscopy;  Laterality: [name];   Allergies  Allergen Reactions  . Aspirin Hives  . Atorvastatin     Yellow fingernails  . Nsaids Hives   Prior to Admission medications   Medication Sig Start Date End Date Taking? Authorizing Provider  ALPRAZolam (XANAX) 0.25 MG tablet Take 1 tablet (0.25 mg total) by mouth 3 (three) times daily as needed. for anxiety 01/23/18  Yes McVey, Gelene Mink, PA-C  CALCIUM PO Take 1 tablet by mouth daily.    Yes [provider]  esomeprazole (NEXIUM) 40 MG capsule Take 1 capsule (40 mg total) daily at 12 noon by mouth. 11/01/17  Yes Sagardia, Ines Bloomer, MD  rosuvastatin (CRESTOR) 20 MG tablet Take 1 tablet (20 mg total) by mouth daily. 05/30/18  Yes McVey, Gelene Mink, PA-C  metoprolol succinate (TOPROL-XL) 25 MG 24 hr tablet Take 0.5 tablets (12.5 mg total) by mouth daily. Take at lunch or in the afternoon 05/30/18 08/28/18  McVey, Gelene Mink, PA-C  NIFEdipine (PROCARDIA-XL/ADALAT CC) 30 MG 24 hr tablet Take 1 tablet (30 mg total) by mouth daily. 05/30/18 08/28/18  McVey, Gelene Mink, PA-C  Social History   Socioeconomic History  . Marital status: Married    Spouse name: Not on file  . Number of children: 3  . Years of education: Not on file  . Highest education level: Not on file  Occupational History  . Not on file  Social Needs  . Financial resource strain: Not on file  . Food insecurity:    Worry: Not on file    Inability: Not on file  . Transportation needs:    Medical: Not on file    Non-medical: Not  on file  Tobacco Use  . Smoking status: Never Smoker  . Smokeless tobacco: Never Used  Substance and Sexual Activity  . Alcohol use: No    Alcohol/week: 0.0 standard drinks  . Drug use: No  . Sexual activity: Not on file  Lifestyle  . Physical activity:    Days per week: Not on file    Minutes per session: Not on file  . Stress: Not on file  Relationships  . Social connections:    Talks on phone: Not on file    Gets together: Not on file    Attends religious service: Not on file    Active member of club or organization: Not on file    Attends meetings of clubs or organizations: Not on file    Relationship status: Not on file  . Intimate partner violence:    Fear of current or ex partner: Not on file    Emotionally abused: Not on file    Physically abused: Not on file    Forced sexual activity: Not on file  Other Topics Concern  . Not on file  Social History Narrative   Lives with husband and works at [employer] Per HPI.     Objective:   Physical Exam  Constitutional: She is oriented to person, place, and time. She appears well-developed and well-nourished.  HENT:  Head: Normocephalic and atraumatic.  Eyes: Pupils are equal, round, and reactive to light. Conjunctivae and EOM are normal.  Neck: Carotid bruit is not present.  Cardiovascular: Normal rate, regular rhythm, normal heart sounds and intact distal pulses.  Pulmonary/Chest: Effort normal and breath sounds normal.  Abdominal: Soft. She exhibits no pulsatile midline mass. There is generalized tenderness and tenderness in the right upper quadrant and epigastric area. There is no rebound, no guarding, no tenderness at McBurney's point and negative Murphy's sign.  Neurological: She is alert and oriented to person, place, and time.  Skin: Skin is warm and dry.  Psychiatric: She has a normal mood and affect. Her behavior is normal.  Vitals reviewed.  Vitals:   10/10/18 1717  BP: 116/72  Pulse:  75  Temp: 98.2 F (36.8 C)  TempSrc: Oral  SpO2: 96%  Weight: 192 lb 6.6 oz (87.3 kg)  Height: 5\' 6"  (1.676 m)   Results for orders placed or performed in visit on 10/10/18  POCT urinalysis dipstick  Result Value Ref Range   Color, UA yellow yellow   Clarity, UA clear clear   Glucose, UA negative negative mg/dL   Bilirubin, UA negative negative   Ketones, POC UA negative negative mg/dL   Spec Grav, UA 1.015 1.010 - 1.025   Blood, UA trace-lysed (A) negative   pH, UA 6.0 5.0 - 8.0   Protein Ur, POC negative negative mg/dL   Urobilinogen, UA 0.2 0.2 or 1.0 E.U./dL   Nitrite, UA Negative Negative   Leukocytes, UA Negative Negative  POCT Microscopic Urinalysis ([facility])  Result Value Ref Range   WBC,UR,HPF,POC None None WBC/hpf   RBC,UR,HPF,POC Few (A) None RBC/hpf   Bacteria None None, Too numerous to count   Mucus Absent Absent   Epithelial Cells, UR Per Microscopy None None, Too numerous to count cells/hpf  POCT CBC  Result Value Ref Range   WBC 7.6 4.6 - 10.2 K/uL   Lymph, poc 1.9 0.6 - 3.4   POC LYMPH PERCENT 25.3 10 - 50 %L   MID (cbc) 0.6 0 - 0.9   POC MID % 8.5 0 - 12 %M   POC Granulocyte 5.0 2 - 6.9   Granulocyte percent 66.2 37 - 80 %G   RBC 4.92 4.04 - 5.48 M/uL   Hemoglobin 12.3 9.5 - 13.5 g/dL   HCT, POC 38.1 29 - 41 %   MCV 77.4 76 - 111 fL   MCH, POC 24.9 (A) 27 - 31.2 pg   MCHC 32.2 31.8 - 35.4 g/dL   RDW, POC 14.4 %   Platelet Count, POC 286 142 - 424 K/uL   MPV 8.1 0 - 99.8 fL       Assessment & Plan:    Diasia Henken is a 62 y.o. female Abdominal pain, unspecified abdominal location - Plan: POCT urinalysis dipstick, POCT Microscopic Urinalysis ([facility]), POCT CBC, Comprehensive metabolic panel, US Abdomen Complete  RUQ abdominal pain - Plan: POCT CBC, Comprehensive metabolic panel, US Abdomen Complete  Other microscopic hematuria - Plan: Urine Culture, nitrofurantoin, macrocrystal-monohydrate, (MACROBID) 100 MG capsule  RLQ abdominal pain  - Plan: US Abdomen Complete  Urinary frequency - Plan: nitrofurantoin, macrocrystal-monohydrate, (MACROBID) 100 MG capsule  Nausea without vomiting   Various areas of abdominal pain.  Initially thought to have possible multiple causes including urinary tract infection, and further evaluation of gallbladder was obtained.  Check CMP, ultrasound, urine culture, Macrobid started.  Further work-up to be determined by testing.  RTC/ER precautions if acutely worse.   Meds ordered this encounter  Medications  . nitrofurantoin, macrocrystal-monohydrate, (MACROBID) 100 MG capsule    Sig: Take 1 capsule (100 mg total) by mouth 2 (two) times daily.    Dispense:  14 capsule    Refill:  0   Patient Instructions    macrobid por orina   voy a Scientist, research (life sciences).   va a cuarto de emergencia si empeoran.    Dolor abdominal en adultos Abdominal Pain, Adult El dolor abdominal puede tener muchas causas. A menudo, no es grave y [place] sin tratamiento o con tratamiento en la casa. Sin embargo, a Product/process development scientist abdominal es intenso. El mdico revisar sus antecedentes mdicos y le har un examen fsico para tratar de Office manager causa del dolor abdominal. Siga estas instrucciones en su casa:  Tome los medicamentos de venta libre y los recetados solamente como se lo haya indicado el mdico. No tome un laxante a menos que se lo haya indicado el mdico.  Beba suficiente lquido para Theatre manager la orina clara o de color amarillo plido.  Controle su afeccin para ver si hay cambios.  Concurra a todas las visitas de control como se lo haya indicado el mdico. Esto es importante. Comunquese con un mdico si:  El dolor abdominal cambia o empeora.  No tiene apetito o baja de peso sin proponrselo.  Est estreido o tiene diarrea durante ms de 2 o 3das.  Tiene dolor cuando orina o defeca.  El dolor abdominal lo despierta de noche.  El dolor empeora con las comidas, despus de  comer o con determinados alimentos.  Tiene vmitos y no puede retener nada.  Tiene fiebre. Solicite ayuda de inmediato si:  El dolor no desaparece tan pronto como el mdico le dijo que era esperable.  No puede detener los vmitos.  El Social research officer, government se siente solo en zonas del abdomen, como el lado derecho o la parte inferior izquierda del abdomen.  Las heces son sanguinolentas o de color negro, o de aspecto alquitranado.  Tiene dolor intenso, clicos, o meteorismo en el abdomen.  Tiene signos de deshidratacin, por ejemplo: ? Elmon Else, muy escasa o falta de [place]. ? Labios agrietados. ? [employer]. ? Ojos hundidos. ? Somnolencia. ? Debilidad. Esta informacin no tiene Marine scientist el consejo del mdico. Asegrese de hacerle al mdico cualquier pregunta que tenga. Document Released: 12/10/2005 Document Revised: 11/29/2016 Document Reviewed: 05/23/2016 Elsevier Interactive Patient Education  [employer].   If you have lab work done today you will be contacted with your lab results within the next 2 weeks.  If you have not heard from [place] then please contact us. The fastest way to get your results is to register for My Chart.   IF you received an x-ray today, you will receive an invoice from [facility]. Please contact [facility] at [phone] with questions or concerns regarding your invoice.   IF you received labwork today, you will receive an invoice from [facility]. Please contact [facility] at [phone] with questions or concerns regarding your invoice.   Our billing staff will not be able to assist you with questions regarding bills from these companies.  You will be contacted with the lab results as soon as they are available. The fastest way to get your results is to activate your My Chart account. Instructions are located on the last page of this paperwork. If you have not heard from [place] regarding the results in 2 weeks, please contact this  office.       Signed,   [name], MD [facility] [place].  10/14/18 3:45 PM

## 2018-10-12 LAB — COMPREHENSIVE METABOLIC PANEL
A/G RATIO: 1.4 (ref 1.2–2.2)
ALBUMIN: 4.2 g/dL (ref 3.6–4.8)
ALK PHOS: 119 IU/L — AB (ref 39–117)
ALT: 20 IU/L (ref 0–32)
AST: 18 IU/L (ref 0–40)
BUN / CREAT RATIO: 20 (ref 12–28)
BUN: 14 mg/dL (ref 8–27)
CHLORIDE: 103 mmol/L (ref 96–106)
CO2: 24 mmol/L (ref 20–29)
CREATININE: 0.7 mg/dL (ref 0.57–1.00)
Calcium: 9.7 mg/dL (ref 8.7–10.3)
GFR calc Af Amer: 108 mL/min/{1.73_m2} (ref >59)
GFR calc non Af Amer: 94 mL/min/{1.73_m2} (ref >59)
GLOBULIN, TOTAL: 2.9 g/dL (ref 1.5–4.5)
Glucose: 76 mg/dL (ref 65–99)
POTASSIUM: 3.9 mmol/L (ref 3.5–5.2)
SODIUM: 142 mmol/L (ref 134–144)
Total Protein: 7.1 g/dL (ref 6.0–8.5)

## 2018-10-12 LAB — URINE CULTURE

## 2018-10-13 ENCOUNTER — Ambulatory Visit (HOSPITAL_COMMUNITY)
Admission: RE | Admit: 2018-10-13 | Discharge: 2018-10-13 | Disposition: A | Discharge status: Home / Self Care | Payer: BLUE CROSS/BLUE SHIELD | Source: Ambulatory Visit | Attending: Family Medicine | Admitting: Family Medicine

## 2018-10-13 DIAGNOSIS — R109 Unspecified abdominal pain: Secondary | ICD-10-CM | POA: Diagnosis not present

## 2018-10-13 DIAGNOSIS — R1011 Right upper quadrant pain: Secondary | ICD-10-CM

## 2018-10-13 DIAGNOSIS — R1031 Right lower quadrant pain: Secondary | ICD-10-CM | POA: Diagnosis not present

## 2018-11-03 ENCOUNTER — Other Ambulatory Visit: Payer: Self-pay | Admitting: Emergency Medicine

## 2018-11-03 DIAGNOSIS — K219 Gastro-esophageal reflux disease without esophagitis: Secondary | ICD-10-CM

## 2018-11-03 DIAGNOSIS — K449 Diaphragmatic hernia without obstruction or gangrene: Secondary | ICD-10-CM

## 2018-11-03 NOTE — Telephone Encounter (Signed)
Requested medication (s) are due for refill today: yes  Requested medication (s) are on the active medication list: yes  Last refill:  11/01/17 for 90 caps and 3 refills  Future visit scheduled: no  Notes to clinic:  Prescription for esomeprazole has expired as of 1109/19  Requested Prescriptions  Pending Prescriptions Disp Refills   esomeprazole (NEXIUM) 40 MG capsule [Pharmacy Med Name: ESOMEPRAZOLE MAGNESIUM 40MG  DR CAPS] 90 capsule 0    Sig: TAKE 1 CAPSULE BY MOUTH EVERY DAY AT [facility]     Gastroenterology: Proton Pump Inhibitors Passed - 11/03/2018  3:46 AM      Passed - Valid encounter within last 12 months    Recent Outpatient Visits          3 weeks ago Abdominal pain, unspecified abdominal location   [facility] at [facility], Ranell Patrick, MD   3 months ago Closed displaced fracture of distal phalanx of lesser toe of right foot with routine healing, subsequent encounter   Primary Care at [facility], [place], MD   3 months ago Closed displaced fracture of distal phalanx of lesser toe of right foot, initial encounter   Primary Care at [facility], Gelene Mink, PA-C   5 months ago Acute upper respiratory infection   [facility] at [facility], Gelene Mink, PA-C   7 months ago Gynecological complaint   Primary Care at [facility], [place], [place]

## 2018-11-14 ENCOUNTER — Ambulatory Visit: Payer: BLUE CROSS/BLUE SHIELD | Admitting: Obstetrics & Gynecology

## 2018-12-02 ENCOUNTER — Ambulatory Visit: Payer: BLUE CROSS/BLUE SHIELD

## 2018-12-05 ENCOUNTER — Inpatient Hospital Stay: Admission: RE | Admit: 2018-12-05 | Payer: BLUE CROSS/BLUE SHIELD | Source: Ambulatory Visit

## 2018-12-10 ENCOUNTER — Ambulatory Visit (INDEPENDENT_AMBULATORY_CARE_PROVIDER_SITE_OTHER): Payer: BLUE CROSS/BLUE SHIELD

## 2018-12-10 ENCOUNTER — Encounter: Payer: Self-pay | Admitting: Family Medicine

## 2018-12-10 ENCOUNTER — Ambulatory Visit: Payer: BLUE CROSS/BLUE SHIELD | Admitting: Family Medicine

## 2018-12-10 ENCOUNTER — Other Ambulatory Visit: Payer: Self-pay

## 2018-12-10 VITALS — BP 118/75 | HR 74 | Temp 98.5°F | Resp 14 | Ht 63.5 in | Wt 194.6 lb

## 2018-12-10 DIAGNOSIS — F418 Other specified anxiety disorders: Secondary | ICD-10-CM | POA: Diagnosis not present

## 2018-12-10 DIAGNOSIS — R0789 Other chest pain: Secondary | ICD-10-CM

## 2018-12-10 DIAGNOSIS — R0602 Shortness of breath: Secondary | ICD-10-CM

## 2018-12-10 MED ORDER — ALPRAZOLAM 0.25 MG PO TABS: 0.2500 mg | ORAL_TABLET | Freq: Every day | ORAL | 0 refills | Status: DC | PRN

## 2018-12-10 NOTE — Progress Notes (Signed)
12/18/20195:13 PM  Emily [name] 1956-07-15, 62 y.o. female [id number]  Chief Complaint  Patient presents with  . clavical pain    left clavical pain with no know injury, breathing is cut(sob) can not lay on the left  x3 wk    HPI:   Patient is a 62 y.o. female with past medical history significant for HTN who presents today for left chest pain  She was in [place] in mid Nov While she was sleeping she went to adjust herself, she had sudden pain of left upper chest pain Bothering her when she raises her arm Arm is getting better Feeling SOB, sudden feeling that she needs to inhale deeply Worried about her heart No cough, no uri symptoms Sleeping well No fever or chills Does have known palpitations and anxiety Takes xanax very prn, she is requesting refill, pmp reviewed No leg swelling Does not smoke   Fall Risk  12/10/2018 10/10/2018 07/29/2018 07/16/2018 05/30/2018  Falls in the past year? 0 No No No No     Depression screen [facility] 2/9 12/10/2018 10/10/2018 07/29/2018  Decreased Interest 0 0 0  Down, Depressed, Hopeless 0 0 0  PHQ - 2 Score 0 0 0    Allergies  Allergen Reactions  . Aspirin Hives  . Atorvastatin     Yellow fingernails  . Nsaids Hives    Prior to Admission medications   Medication Sig Start Date End Date Taking? Authorizing Provider  ALPRAZolam (XANAX) 0.25 MG tablet Take 1 tablet (0.25 mg total) by mouth 3 (three) times daily as needed. for anxiety 01/23/18  Yes McVey, Gelene Mink, PA-C  CALCIUM PO Take 1 tablet by mouth daily.    Yes [provider]  esomeprazole (NEXIUM) 40 MG capsule Take 1 capsule (40 mg total) daily at 12 noon by mouth. 11/01/17  Yes Sagardia, Ines Bloomer, MD  nitrofurantoin, macrocrystal-monohydrate, (MACROBID) 100 MG capsule Take 1 capsule (100 mg total) by mouth 2 (two) times daily. 10/10/18  Yes Wendie Agreste, MD  rosuvastatin (CRESTOR) 20 MG tablet Take 1 tablet (20 mg total) by mouth daily. 05/30/18   Yes McVey, Gelene Mink, PA-C  metoprolol succinate (TOPROL-XL) 25 MG 24 hr tablet Take 0.5 tablets (12.5 mg total) by mouth daily. Take at lunch or in the afternoon 05/30/18 08/28/18  McVey, Gelene Mink, PA-C  NIFEdipine (PROCARDIA-XL/ADALAT CC) 30 MG 24 hr tablet Take 1 tablet (30 mg total) by mouth daily. 05/30/18 08/28/18  McVey, Gelene Mink, PA-C    Past Medical History:  Diagnosis Date  . Abdominal pain, chronic, epigastric 11/02/2015   Started amitriptyline at hospitalization 04/13/2016 EGD x 2 negative Also w/ chronic chest wall and back pain Would try not to work up further   . Anxiety   . Arthritis   . Back pain   . Beta thalassemia minor    diagnosed by hematology Dr. Jennette Kettle, also with sickle cell trait  . Chronic high back pain 09/24/2015  . Colon polyps   . GERD (gastroesophageal reflux disease)   . Hyperlipidemia   . Hypertension   . Sickle cell trait ([place])    diagnosed by hematology Dr. Lindi Adie, coexsisting with beta-thalassemia minor    Past Surgical History:  Procedure Laterality Date  . ABDOMINAL HYSTERECTOMY    . COLONOSCOPY    . ESOPHAGOGASTRODUODENOSCOPY    . ESOPHAGOGASTRODUODENOSCOPY [name] 04/13/2016   Procedure: ESOPHAGOGASTRODUODENOSCOPY (EGD);  Surgeon: Gatha Mayer, MD;  Location: [facility];  Service: Endoscopy;  Laterality: [name];  Social History   Tobacco Use  . Smoking status: Never Smoker  . Smokeless tobacco: Never Used  Substance Use Topics  . Alcohol use: No    Alcohol/week: 0.0 standard drinks    Family History  Problem Relation Age of Onset  . Aneurysm Mother   . Diabetes Father   . Colon cancer Brother 32  . Liver cancer Brother        mets from colon  . Esophageal cancer Neg Hx   . Stomach cancer Neg Hx   . Pancreatic cancer Neg Hx     ROS Per hpi  OBJECTIVE:  Blood pressure 118/75, pulse 74, temperature 98.5 F (36.9 C), temperature source Oral, resp. rate 14, height 5' 3.5" (1.613 m), weight 194 lb 9.6 oz  (88.3 kg), SpO2 95 %. Body mass index is 33.93 kg/m.   Physical Exam Vitals signs and nursing note reviewed.  Constitutional:      Appearance: She is well-developed.  HENT:     Head: Normocephalic and atraumatic.     Right Ear: Hearing, tympanic membrane, ear canal and external ear normal.     Left Ear: Hearing, tympanic membrane, ear canal and external ear normal.  Eyes:     Conjunctiva/sclera: Conjunctivae normal.     Pupils: Pupils are equal, round, and reactive to light.  Neck:     Musculoskeletal: Neck supple.  Cardiovascular:     Rate and Rhythm: Normal rate and regular rhythm.     Heart sounds: Normal heart sounds. No murmur. No friction rub. No gallop.   Pulmonary:     Effort: Pulmonary effort is normal.     Breath sounds: Normal breath sounds. No wheezing or rales.  Chest:     Chest wall: Tenderness present.  Lymphadenopathy:     Cervical: No cervical adenopathy.  Skin:    General: Skin is warm and dry.  Neurological:     Mental Status: She is alert and oriented to person, place, and time.    Dg Chest 2 View  Result Date: 12/10/2018 CLINICAL DATA:  Shortness of breath. EXAM: CHEST - 2 VIEW COMPARISON:  Chest CT 01/20/2018 and chest radiograph 01/12/2018 FINDINGS: Multiple linear densities at the lung bases are suggestive for atelectasis with some underlying scarring. Small focus of density along the anterior chest could represent focal atelectasis or overlying shadows. Otherwise, the lungs are clear. No pleural effusions. Heart and mediastinum are within normal limits. Degenerative changes in lower thoracic spine. IMPRESSION: Atelectasis and scarring at the lung bases. Electronically Signed   By: Markus Daft M.D.   On: 12/10/2018 18:27    My interpretation of EKG:  NSR, HR 61, no st changes  ASSESSMENT and PLAN  1. SOB (shortness of breath) Work up so far benign. Labs incl d dimer pending. Normal resp rate and O2 sat, exam and CXR. RTC precautions reviewed.  - EKG  12-Lead - DG Chest 2 View; Future - D-dimer, quantitative (not at [facility]) - CBC - Comprehensive metabolic panel  2. Chest wall pain Cont with supportive measures  3. Situational anxiety Takes very prn. Last rx given jan 2019. pmp reviewed. Med refilled. - ALPRAZolam (XANAX) 0.25 MG tablet; Take 1 tablet (0.25 mg total) by mouth daily as needed. for anxiety  Return if symptoms worsen or fail to improve.    Rutherford Guys, MD [facility] at [place], [place] [address] Ph.  [phone] Fax [phone]

## 2018-12-10 NOTE — Patient Instructions (Signed)
° ° ° °  If you have lab work done today you will be contacted with your lab results within the next 2 weeks.  If you have not heard from us then please contact us. The fastest way to get your results is to register for My Chart. ° ° °IF you received an x-ray today, you will receive an invoice from [facility]. Please contact [facility] at [phone] with questions or concerns regarding your invoice.  ° °IF you received labwork today, you will receive an invoice from [facility]. Please contact [facility] at [phone] with questions or concerns regarding your invoice.  ° °Our billing staff will not be able to assist you with questions regarding bills from these companies. ° °You will be contacted with the lab results as soon as they are available. The fastest way to get your results is to activate your My Chart account. Instructions are located on the last page of this paperwork. If you have not heard from us regarding the results in 2 weeks, please contact this office. °  ° ° ° °

## 2018-12-11 ENCOUNTER — Telehealth: Payer: Self-pay | Admitting: Cardiology

## 2018-12-11 LAB — CBC
Hematocrit: 36.6 % (ref 34.0–46.6)
Hemoglobin: 12.2 g/dL (ref 11.1–15.9)
MCH: 25 pg — ABNORMAL LOW (ref 26.6–33.0)
MCHC: 33.3 g/dL (ref 31.5–35.7)
MCV: 75 fL — ABNORMAL LOW (ref 79–97)
Platelets: 311 10*3/uL (ref 150–450)
RBC: 4.88 x10E6/uL (ref 3.77–5.28)
RDW: 14.7 % (ref 12.3–15.4)
WBC: 7.5 10*3/uL (ref 3.4–10.8)

## 2018-12-11 LAB — COMPREHENSIVE METABOLIC PANEL
ALT: 19 IU/L (ref 0–32)
AST: 20 IU/L (ref 0–40)
Albumin/Globulin Ratio: 1.6 (ref 1.2–2.2)
Albumin: 4.7 g/dL (ref 3.6–4.8)
Alkaline Phosphatase: 152 IU/L — ABNORMAL HIGH (ref 39–117)
BUN/Creatinine Ratio: 16 (ref 12–28)
BUN: 8 mg/dL (ref 8–27)
Bilirubin Total: 0.3 mg/dL (ref 0.0–1.2)
CO2: 26 mmol/L (ref 20–29)
Calcium: 9.9 mg/dL (ref 8.7–10.3)
Chloride: 101 mmol/L (ref 96–106)
Creatinine, Ser: 0.51 mg/dL — ABNORMAL LOW (ref 0.57–1.00)
GFR calc Af Amer: 120 mL/min/{1.73_m2} (ref >59)
GFR calc non Af Amer: 104 mL/min/{1.73_m2} (ref >59)
Globulin, Total: 2.9 g/dL (ref 1.5–4.5)
Glucose: 81 mg/dL (ref 65–99)
Potassium: 3.6 mmol/L (ref 3.5–5.2)
Sodium: 141 mmol/L (ref 134–144)
Total Protein: 7.6 g/dL (ref 6.0–8.5)

## 2018-12-11 LAB — D-DIMER, QUANTITATIVE: D-DIMER: 0.27 mg/L FEU (ref 0.00–0.49)

## 2018-12-11 NOTE — Telephone Encounter (Signed)
New message   Pt c/o Shortness Of Breath: STAT if SOB developed within the last 24 hours or pt is noticeably SOB on the phone  1. Are you currently SOB (can you hear that pt is SOB on the phone)? Not right now.  2. How long have you been experiencing SOB? A week  3. Are you SOB when sitting or when up moving around? both  4. Are you currently experiencing any other symptoms? Pt said she has been waking up with mild chest pain for about a week.

## 2018-12-11 NOTE — Telephone Encounter (Signed)
Spoke with patients daughter and patient has been waking up for about a week with chest pain and blood pressure 150/100. After she takes her medication blood pressure down to normal and chest pain subsides, no numbers available. She is taking Procardia 30 mg daily and Toprol 25 mg full tablet daily. She does not pay attention to HR. She has been having intermittent shortness of breath with our without exertion. Patient has appointment scheduled with PA 12/25/17. Will forward to Dr Percival Spanish for review

## 2018-12-12 ENCOUNTER — Ambulatory Visit (HOSPITAL_COMMUNITY): Payer: BLUE CROSS/BLUE SHIELD

## 2018-12-12 NOTE — Telephone Encounter (Signed)
Patient's daughter is returning call.

## 2018-12-12 NOTE — Telephone Encounter (Signed)
Returned call to pt daughter Abigail Butts. Adv her of Dr.Hochrein's recommendation below. She is requesting an earlier appt. Appt scheduled with Kerin Ransom, PA on 12/19/18 @ 3pm. Adv her that the pt should go to the ED as instructed by Dr.Hochrein if symptoms worsen. Pt daughter wendy verbalized understanding and voiced appreciation for the call back.

## 2018-12-12 NOTE — Telephone Encounter (Signed)
Discussed with Dr Percival Spanish, no changes at this time, keep appointment as scheduled, and go to ED if worse  Left message to call back

## 2018-12-18 ENCOUNTER — Ambulatory Visit (HOSPITAL_COMMUNITY)
Admission: RE | Admit: 2018-12-18 | Discharge: 2018-12-18 | Disposition: A | Discharge status: Home / Self Care | Payer: BLUE CROSS/BLUE SHIELD | Source: Ambulatory Visit | Attending: Cardiovascular Disease | Admitting: Cardiovascular Disease

## 2018-12-18 DIAGNOSIS — I10 Essential (primary) hypertension: Secondary | ICD-10-CM | POA: Insufficient documentation

## 2018-12-18 DIAGNOSIS — I6523 Occlusion and stenosis of bilateral carotid arteries: Secondary | ICD-10-CM | POA: Insufficient documentation

## 2018-12-19 ENCOUNTER — Ambulatory Visit: Payer: BLUE CROSS/BLUE SHIELD | Admitting: Cardiology

## 2018-12-19 ENCOUNTER — Encounter: Payer: Self-pay | Admitting: Cardiology

## 2018-12-19 VITALS — BP 114/62 | HR 74 | Ht 63.5 in | Wt 194.0 lb

## 2018-12-19 DIAGNOSIS — I6523 Occlusion and stenosis of bilateral carotid arteries: Secondary | ICD-10-CM

## 2018-12-19 DIAGNOSIS — R079 Chest pain, unspecified: Secondary | ICD-10-CM | POA: Diagnosis not present

## 2018-12-19 DIAGNOSIS — I779 Disorder of arteries and arterioles, unspecified: Secondary | ICD-10-CM | POA: Insufficient documentation

## 2018-12-19 DIAGNOSIS — R0602 Shortness of breath: Secondary | ICD-10-CM | POA: Diagnosis not present

## 2018-12-19 DIAGNOSIS — I739 Peripheral vascular disease, unspecified: Secondary | ICD-10-CM

## 2018-12-19 NOTE — Patient Instructions (Signed)
Medication Instructions:  Continue current medications If you need a refill on your cardiac medications before your next appointment, please call your pharmacy.   Testing/Procedures: Lurena Joiner PA has requested that you have an echocardiogram. Echocardiography is a painless test that uses sound waves to create images of your heart. It provides your doctor with information about the size and shape of your heart and how well your heart's chambers and valves are working. This procedure takes approximately one hour. There are no restrictions for this procedure.  Lurena Joiner PA has ordered a Myocardial Perfusion Imaging Study.   The test will take approximately 3 to 4 hours to complete; you may bring reading material.  If someone comes with you to your appointment, they will need to remain in the main lobby due to limited space in the testing area.   You will need to hold the following medications prior to your stress test: metoprolol succinate (24 hours prior to test)   How to prepare for your Myocardial Perfusion Test:  Do not eat or drink 3 hours prior to your test, except you may have water.  Do not consume products containing caffeine (regular or decaffeinated) 12 hours prior to your test. (ex: coffee, chocolate, sodas, tea).  Do wear comfortable clothes (no dresses or overalls) and walking shoes, tennis shoes preferred (No heels or open toe shoes are allowed).  Do NOT wear cologne, perfume, aftershave, or lotions (deodorant is allowed).  If these instructions are not followed, your test will have to be rescheduled.  Both tests can be completed at [address] - [address]rd Floor  Follow-Up: At [facility], you and your health needs are our priority.  As part of our continuing mission to provide you with exceptional heart care, we have created designated Provider Care Teams.  These Care Teams include your primary Cardiologist (physician) and Advanced Practice Providers (APPs -  Physician  Assistants and Nurse Practitioners) who all work together to provide you with the care you need, when you need it. You will need a follow up appointment in 4 weeks (after stress test & echo). You may see Minus Breeding, MD or one of the following Advanced Practice Providers on your designated Care Team:   Rosaria Ferries, PA-C . Jory Sims, DNP, ANP  Any Other Special Instructions Will Be Listed Below (If Applicable).

## 2018-12-19 NOTE — Progress Notes (Signed)
12/19/2018 Emily [name]   [birth year]  [id number]  Primary Physician McVey, Gelene Mink, PA-C Primary Cardiologist: Dr Percival Spanish  HPI: Patient is a pleasant 62 year old female from [place].  She has a history of treated hypertension and dyslipidemia. The patient was accompanied by an interpreter.   We have seen her in the past for chest pain.  She had a low risk Myoview and a normal echo in 2015.  The patient presents today with a history of 3 to 4 weeks of increasing shortness of breath with exertion.  She also says she is short of breath when she lays down, that was not apparent on exam today.  The patient denies edema.  She has had some left localized chest pain.  She has risk factors for coronary disease and has a history of vascular disease.    The patient recently saw her primary care provider for the same symptoms.  Lab work including a d-dimer CBC and c-Met were all within normal limits.  CXR done 12/10/2018 showed basilar atelectasis.  She has had a previous chest CT scan January 2019 that showed 2 tiny 4 mm pulmonary nodules in the left upper lobe unchanged from a prior study in 2015, considered benign and a small hiatal hernia.  There was some linear scarring at the bases noted.  There were no atherosclerotic calcifications in the thoracic aorta or coronary arteries.   She had previous ultrasound of her carotids in December 2018 that showed 40 to 59% narrowing, that was not able to be reproduced on her most recent study of 12/18/2018 which showed 1 to 39% narrowing.  She was last seen in the office in January 2019.     Current Outpatient Medications  Medication Sig Dispense Refill  . ALPRAZolam (XANAX) 0.25 MG tablet Take 1 tablet (0.25 mg total) by mouth daily as needed. for anxiety 30 tablet 0  . CALCIUM PO Take 1 tablet by mouth daily.     Marland Kitchen esomeprazole (NEXIUM) 40 MG capsule Take 1 capsule (40 mg total) daily at 12 noon by mouth. 90 capsule 3  . metoprolol  succinate (TOPROL-XL) 25 MG 24 hr tablet Take 0.5 tablets (12.5 mg total) by mouth daily. Take at lunch or in the afternoon 45 tablet 3  . NIFEdipine (PROCARDIA-XL/ADALAT CC) 30 MG 24 hr tablet Take 1 tablet (30 mg total) by mouth daily. 90 tablet 3  . rosuvastatin (CRESTOR) 20 MG tablet Take 1 tablet (20 mg total) by mouth daily. 90 tablet 3   No current facility-administered medications for this visit.     Allergies  Allergen Reactions  . Ibuprofen   . Aspirin Hives  . Atorvastatin     Yellow fingernails  . Nsaids Hives    Past Medical History:  Diagnosis Date  . Abdominal pain, chronic, epigastric 11/02/2015   Started amitriptyline at hospitalization 04/13/2016 EGD x 2 negative Also w/ chronic chest wall and back pain Would try not to work up further   . Anxiety   . Arthritis   . Back pain   . Beta thalassemia minor    diagnosed by hematology Dr. Jennette Kettle, also with sickle cell trait  . Chronic high back pain 09/24/2015  . Colon polyps   . GERD (gastroesophageal reflux disease)   . Hyperlipidemia   . Hypertension   . Sickle cell trait ([place])    diagnosed by hematology Dr. Lindi Adie, coexsisting with beta-thalassemia minor    Social History   Socioeconomic History  . Marital  status: Married    Spouse name: Not on file  . Number of children: 3  . Years of education: Not on file  . Highest education level: Not on file  Occupational History  . Not on file  Social Needs  . Financial resource strain: Not on file  . Food insecurity:    Worry: Not on file    Inability: Not on file  . Transportation needs:    Medical: Not on file    Non-medical: Not on file  Tobacco Use  . Smoking status: Never Smoker  . Smokeless tobacco: Never Used  Substance and Sexual Activity  . Alcohol use: No    Alcohol/week: 0.0 standard drinks  . Drug use: No  . Sexual activity: Not on file  Lifestyle  . Physical activity:    Days per week: Not on file    Minutes per session: Not on file    . Stress: Not on file  Relationships  . Social connections:    Talks on phone: Not on file    Gets together: Not on file    Attends religious service: Not on file    Active member of club or organization: Not on file    Attends meetings of clubs or organizations: Not on file    Relationship status: Not on file  . Intimate partner violence:    Fear of current or ex partner: Not on file    Emotionally abused: Not on file    Physically abused: Not on file    Forced sexual activity: Not on file  Other Topics Concern  . Not on file  Social History Narrative   Lives with husband and works at [employer] History  Problem Relation Age of Onset  . Aneurysm Mother   . Diabetes Father   . Colon cancer Brother 8  . Liver cancer Brother        mets from colon  . Esophageal cancer Neg Hx   . Stomach cancer Neg Hx   . Pancreatic cancer Neg Hx      Review of Systems: General: negative for chills, fever, night sweats or weight changes.  Cardiovascular: negative for chest pain, dyspnea on exertion, edema, orthopnea, palpitations, paroxysmal nocturnal dyspnea or shortness of breath Dermatological: negative for rash Respiratory: negative for cough or wheezing Urologic: negative for hematuria Abdominal: negative for nausea, vomiting, diarrhea, bright red blood per rectum, melena, or hematemesis Neurologic: negative for visual changes, syncope, or dizziness All other systems reviewed and are otherwise negative except as noted above.    Blood pressure 114/62, pulse 74, height 5' 3.5" (1.613 m), weight 194 lb (88 kg), SpO2 96 %.  General appearance: alert, cooperative, no distress and mildly obese Neck: no carotid bruit and no JVD Lungs: decreased at bases Heart: regular rate and rhythm Abdomen: not distended Extremities: no edema Skin: warm and dry Neurologic: Grossly normal  EKG NSR, poor anterior RW  ASSESSMENT AND PLAN:   SOB (shortness of breath) Pt seen today  with complaints of SOB for 3-4 weeks. Recent work up by PCP including labs and CXR unremarkable.   HTN (hypertension) Controlled  Hyperlipidemia On statin Rx  Chest pain She has risk factors for CAD including PVD, HTN, and HLD. Since she is also complaining of chest pain we'll proceed with an exercise Myoview as well as an echo.   Carotid artery disease ([place]) 1-39% by carotid US 12/18/18   PLAN  GXT Myoview and echo- f/u after  these are done.   Kerin Ransom PA-C 12/19/2018 3:52 PM

## 2018-12-19 NOTE — Assessment & Plan Note (Signed)
1-39% by carotid US 12/18/18

## 2018-12-19 NOTE — Assessment & Plan Note (Signed)
On statin Rx 

## 2018-12-19 NOTE — Assessment & Plan Note (Signed)
She has risk factors for CAD including PVD, HTN, and HLD. Since she is also complaining of chest pain we'll proceed with an exercise Myoview as well as an echo.

## 2018-12-19 NOTE — Assessment & Plan Note (Signed)
Controlled.  

## 2018-12-19 NOTE — Assessment & Plan Note (Signed)
Pt seen today with complaints of SOB for 3-4 weeks. Recent work up by PCP including labs and CXR unremarkable.

## 2018-12-22 ENCOUNTER — Telehealth (HOSPITAL_COMMUNITY): Payer: Self-pay | Admitting: *Deleted

## 2018-12-22 ENCOUNTER — Telehealth: Payer: Self-pay | Admitting: Cardiology

## 2018-12-22 NOTE — Telephone Encounter (Signed)
Patient's daughter, per dpr,t given detailed instructions per Myocardial Perfusion Study Information Sheet for the test on 12/26/17. Patient notified to arrive 15 minutes early and that it is imperative to arrive on time for appointment to keep from having the test rescheduled.  If you need to cancel or reschedule your appointment, please call the office within 24 hours of your appointment. . Patient verbalized understanding. [name]

## 2018-12-22 NOTE — Telephone Encounter (Signed)
New Message   Pt is calling, states she has some questions about her echo results. Please call

## 2018-12-23 NOTE — Telephone Encounter (Signed)
I spoke with patients daughter Abigail Butts, per [facility] and she wanted to know if patient needed to increase her cholesterol medication. I informed her that as of right now we are not making any medication changes. She voiced understanding.

## 2018-12-25 ENCOUNTER — Ambulatory Visit: Payer: BLUE CROSS/BLUE SHIELD | Admitting: Physician Assistant

## 2018-12-26 ENCOUNTER — Other Ambulatory Visit: Payer: Self-pay

## 2018-12-26 ENCOUNTER — Ambulatory Visit (HOSPITAL_COMMUNITY): Payer: Commercial Managed Care - PPO | Attending: Cardiology

## 2018-12-26 ENCOUNTER — Ambulatory Visit (HOSPITAL_BASED_OUTPATIENT_CLINIC_OR_DEPARTMENT_OTHER): Payer: Commercial Managed Care - PPO

## 2018-12-26 DIAGNOSIS — R079 Chest pain, unspecified: Secondary | ICD-10-CM

## 2018-12-26 DIAGNOSIS — R0602 Shortness of breath: Secondary | ICD-10-CM

## 2018-12-26 LAB — MYOCARDIAL PERFUSION IMAGING
Estimated workload: 7 METS
Exercise duration (min): 5 min
Exercise duration (sec): 30 s
LV dias vol: 70 mL (ref 46–106)
LV sys vol: 20 mL
MPHR: 159 {beats}/min
Peak HR: 153 {beats}/min
Percent HR: 96 %
Rest HR: 56 {beats}/min
SDS: 1
SRS: 0
SSS: 1
TID: 0.87

## 2018-12-26 MED ORDER — TECHNETIUM TC 99M TETROFOSMIN IV KIT
32.6000 | PACK | Freq: Once | INTRAVENOUS | Status: AC | PRN
  Administered 2018-12-26: 32.6 via INTRAVENOUS
  Filled 2018-12-26: qty 33

## 2018-12-26 MED ORDER — TECHNETIUM TC 99M TETROFOSMIN IV KIT
10.7000 | PACK | Freq: Once | INTRAVENOUS | Status: AC | PRN
  Administered 2018-12-26: 10.7 via INTRAVENOUS
  Filled 2018-12-26: qty 11

## 2018-12-29 ENCOUNTER — Telehealth: Payer: Self-pay | Admitting: Cardiology

## 2018-12-29 NOTE — Telephone Encounter (Signed)
New message     Pt daughter wendy calling in about lab results

## 2018-12-29 NOTE — Telephone Encounter (Signed)
The patient's daughter has been notified of the results and verbalized understanding.  All questions (if any) were answered. Jacqulynn Cadet, [place] 12/29/2018 1:41 PM

## 2018-12-29 NOTE — Progress Notes (Signed)
Called and spoke with the daughter Marlane Hatcher and she verified mother's DOB. Abigail Butts of informed of mother's ECHO and Stress test results also informed her of her mother's upcoming appointment. Daughter verbalized understanding.  All questions (if any) were answered. Jacqulynn Cadet, CMA 12/29/2018 1:39 PM

## 2018-12-29 NOTE — Telephone Encounter (Signed)
Routed to [place]

## 2018-12-29 NOTE — Progress Notes (Signed)
Called the interpreter line for assistance in giving the patient the results of the ECHO. The interpreters name is Joelene Millin ID# [id number], the patient did not answer the telephone and  interpreter was not able to leave a message for the voicemail box was full. Will try back later.

## 2019-01-21 NOTE — Progress Notes (Deleted)
Cardiology Office Note   Date:  01/21/2019   ID:  Lateia, Fraser 06-17-1956, MRN [id number]  PCP:  Dorise Hiss, PA-C  Cardiologist:   Minus Breeding, MD  Referring:  Dorise Hiss, PA-C  No chief complaint on file.     History of Present Illness: Emily [name] is a 63 y.o. female who is following up for evaluation of ***.   I saw her in 2018 with low BP.  She was last seen by Kerin Ransom [facility] in Dec for evaluation of chest pain.  Echo was unremarkable.  Stress test was negative for ischemia.   ***    referred by McVey, Gelene Mink, PA-C for evaluation of low BP. She was seen by Dr. Meda Coffee in 2016 for chest pain.  She had a negative stress test.  She had HTN but seemed to be sensitive to meds.     She reports that she has had some low blood pressures.  She said it used to be in the 120s over 80s but now she often has blood pressures in the 90s over 60s.  She feels lightheaded.  She feels nervous quite a bit of the time.  She says she has quite a bit of stress at work.  She has not had any frank syncope or presyncope.  He does describe some palpitations.  She is not able to quantify or qualify these but they do not sound like any sustained dysrhythmias.  She is not having any chest pressure, neck or arm discomfort but she does have some right-sided neck discomfort at times.   Past Medical History:  Diagnosis Date  . Abdominal pain, chronic, epigastric 11/02/2015   Started amitriptyline at hospitalization 04/13/2016 EGD x 2 negative Also w/ chronic chest wall and back pain Would try not to work up further   . Anxiety   . Arthritis   . Back pain   . Beta thalassemia minor    diagnosed by hematology Dr. Jennette Kettle, also with sickle cell trait  . Chronic high back pain 09/24/2015  . Colon polyps   . GERD (gastroesophageal reflux disease)   . Hyperlipidemia   . Hypertension   . Sickle cell trait ([place])    diagnosed by hematology Dr.  Lindi Adie, coexsisting with beta-thalassemia minor    Past Surgical History:  Procedure Laterality Date  . ABDOMINAL HYSTERECTOMY    . COLONOSCOPY    . ESOPHAGOGASTRODUODENOSCOPY    . ESOPHAGOGASTRODUODENOSCOPY [name] 04/13/2016   Procedure: ESOPHAGOGASTRODUODENOSCOPY (EGD);  Surgeon: Gatha Mayer, MD;  Location: [facility];  Service: Endoscopy;  Laterality: [name];     Current Outpatient Medications  Medication Sig Dispense Refill  . ALPRAZolam (XANAX) 0.25 MG tablet Take 1 tablet (0.25 mg total) by mouth daily as needed. for anxiety 30 tablet 0  . CALCIUM PO Take 1 tablet by mouth daily.     Marland Kitchen esomeprazole (NEXIUM) 40 MG capsule Take 1 capsule (40 mg total) daily at 12 noon by mouth. 90 capsule 3  . metoprolol succinate (TOPROL-XL) 25 MG 24 hr tablet Take 0.5 tablets (12.5 mg total) by mouth daily. Take at lunch or in the afternoon 45 tablet 3  . NIFEdipine (PROCARDIA-XL/ADALAT CC) 30 MG 24 hr tablet Take 1 tablet (30 mg total) by mouth daily. 90 tablet 3  . rosuvastatin (CRESTOR) 20 MG tablet Take 1 tablet (20 mg total) by mouth daily. 90 tablet 3   No current facility-administered medications for this visit.  Allergies:   Ibuprofen; Aspirin; Atorvastatin; and Nsaids    ROS:  Please see the history of present illness.   Otherwise, review of systems are positive for none.   All other systems are reviewed and negative.    PHYSICAL EXAM: VS:  There were no vitals taken for this visit. , BMI There is no height or weight on file to calculate BMI. GENERAL:  Well appearing NECK:  No jugular venous distention, waveform within normal limits, carotid upstroke brisk and symmetric, possible soft right carotid bruits, no thyromegaly LUNGS:  Clear to auscultation bilaterally BACK:  No CVA tenderness CHEST:  Unremarkable HEART:  PMI not displaced or sustained,S1 and S2 within normal limits, no S3, no S4, no clicks, no rubs, no murmurs ABD:  Flat, positive bowel sounds normal in frequency in  pitch, no bruits, no rebound, no guarding, no midline pulsatile mass, no hepatomegaly, no splenomegaly EXT:  2 plus pulses throughout, no edema, no cyanosis no clubbing    EKG:  EKG is not *** ordered today. ***  Recent Labs: 12/10/2018: ALT 19; BUN 8; Creatinine, Ser 0.51; Hemoglobin 12.2; Platelets 311; Potassium 3.6; Sodium 141    Lipid Panel    Component Value Date/Time   CHOL 143 05/30/2018 1757   TRIG 76 05/30/2018 1757   HDL 54 05/30/2018 1757   CHOLHDL 2.6 05/30/2018 1757   CHOLHDL 3.4 05/22/2015 1342   VLDL 23 05/22/2015 1342   LDLCALC 74 05/30/2018 1757   LDLDIRECT 174 (H) 10/02/2012 2025      Wt Readings from Last 3 Encounters:  12/19/18 194 lb (88 kg)  12/10/18 194 lb 9.6 oz (88.3 kg)  10/10/18 192 lb 6.6 oz (87.3 kg)      Other studies Reviewed: Additional studies/ records that were reviewed today include: Office labs. Review of the above records demonstrates:  Please see elsewhere in the note.     ASSESSMENT AND PLAN:  CHEST PAIN:  ***  SOB:  ***    HTN:  ***  DYSLIPIDEMIA:  ***  AORTIC ATHEROSCLEROSIS:  ***  CAROTID STENOSIS:   She had only mild plaque in 2019.  No change in therapy or further testing is indicated.  ***    Current medicines are reviewed at length with the patient today.  The patient does not have concerns regarding medicines.  The following changes have been made:  As above.   Labs/ tests ordered today include:  No orders of the defined types were placed in this encounter.    Disposition:   FU with APP in one month.     Signed, Minus Breeding, MD  01/21/2019 4:53 PM    [facility]

## 2019-01-22 ENCOUNTER — Ambulatory Visit: Payer: BLUE CROSS/BLUE SHIELD | Admitting: Cardiology

## 2019-02-04 ENCOUNTER — Ambulatory Visit: Payer: Commercial Managed Care - PPO | Admitting: Obstetrics & Gynecology

## 2019-02-04 ENCOUNTER — Encounter: Payer: Self-pay | Admitting: Obstetrics & Gynecology

## 2019-02-04 VITALS — BP 138/86 | Ht 66.0 in | Wt 198.0 lb

## 2019-02-04 DIAGNOSIS — Z90710 Acquired absence of both cervix and uterus: Secondary | ICD-10-CM

## 2019-02-04 DIAGNOSIS — R300 Dysuria: Secondary | ICD-10-CM | POA: Diagnosis not present

## 2019-02-04 DIAGNOSIS — Z90722 Acquired absence of ovaries, bilateral: Secondary | ICD-10-CM

## 2019-02-04 DIAGNOSIS — N951 Menopausal and female climacteric states: Secondary | ICD-10-CM

## 2019-02-04 DIAGNOSIS — Z9079 Acquired absence of other genital organ(s): Secondary | ICD-10-CM | POA: Diagnosis not present

## 2019-02-04 NOTE — Progress Notes (Signed)
    Emily [name] 1956-03-05 [id number]        63 y.o.  [id number] Married.  From [place].  RP: Vaginal dryness/difficulty with IC and burning with urination  HPI: S/P Total Hysterectomy/BSO for Fibroids.  Recently felt some burning when passing urine, no pelvic pain, no frequency and no fever.  Longstanding vaginal dryness causing difficulty with intercourse.  Menopause otherwise well-tolerated on no hormone replacement therapy.   OB History  Gravida Para Term Preterm AB Living  4 3     1 3   SAB TAB Ectopic Multiple Live Births  1            # Outcome Date GA Lbr Len/2nd Weight Sex Delivery Anes PTL Lv  4 SAB           3 Para           2 Para           1 Para             Past medical history,surgical history, problem list, medications, allergies, family history and social history were all reviewed and documented in the EPIC chart.   Directed ROS with pertinent positives and negatives documented in the history of present illness/assessment and plan.  Exam:  Vitals:   02/04/19 1623  BP: 138/86  Weight: 198 lb (89.8 kg)  Height: 5\' 6"  (1.676 m)   General appearance:  Normal  Abdomen: Normal  Gynecologic exam: Vulva normal.  Speculum:  Vagina normal.  Vaginal secretions normal.  Bimanual exam: S/P Total Hysterectomy.  No pelvic mass/cyst felt, NT, no colpocele/cystocele/rectocele.  U/A: Yellow clear, proteins negative, nitrites negative, white blood cells negative, red blood cells negative, bacteria negative.  No indication for urine culture.   Assessment/Plan:  63 y.o. [id number]   1. Vaginal dryness, menopausal Postmenopausal atrophic vaginitis, declines topical Estrogen.  Recommend coconut oil, patient will try.  2. S/P total hysterectomy and BSO (bilateral salpingo-oophorectomy)  3. Dysuria Urine analysis completely negative.  Patient reassured. - Urinalysis,Complete w/RFL Culture  Follow-up annual gynecologic exam.  Counseling on above issues and  coordination of care more than 50% for 30 minutes.   Princess Bruins MD, 4:30 PM 02/04/2019

## 2019-02-05 ENCOUNTER — Encounter: Payer: Self-pay | Admitting: Obstetrics & Gynecology

## 2019-02-05 LAB — URINALYSIS, COMPLETE W/RFL CULTURE
Bacteria, UA: NONE SEEN /HPF
Bilirubin Urine: NEGATIVE
Glucose, UA: NEGATIVE
HYALINE CAST: NONE SEEN /LPF
Ketones, ur: NEGATIVE
Leukocyte Esterase: NEGATIVE
Nitrites, Initial: NEGATIVE
PH: 7.5 (ref 5.0–8.0)
Protein, ur: NEGATIVE
RBC / HPF: NONE SEEN /HPF (ref 0–2)
Specific Gravity, Urine: 1.01 (ref 1.001–1.03)
WBC, UA: NONE SEEN /HPF (ref 0–5)

## 2019-02-05 LAB — NO CULTURE INDICATED

## 2019-02-05 NOTE — Patient Instructions (Signed)
1. Vaginal dryness, menopausal Postmenopausal atrophic vaginitis, declines topical Estrogen.  Recommend coconut oil, patient will try.  2. S/P total hysterectomy and BSO (bilateral salpingo-oophorectomy)  3. Dysuria Urine analysis completely negative.  Patient reassured. Emily [name] w/RFL Culture  Follow-up annual gynecologic exam.  daizha, anand un placer encontrarle hoy!

## 2019-03-03 ENCOUNTER — Other Ambulatory Visit: Payer: Self-pay

## 2019-03-03 ENCOUNTER — Telehealth: Payer: Self-pay | Admitting: Physician Assistant

## 2019-03-03 DIAGNOSIS — I10 Essential (primary) hypertension: Secondary | ICD-10-CM

## 2019-03-03 MED ORDER — ROSUVASTATIN CALCIUM 20 MG PO TABS: 20.0000 mg | ORAL_TABLET | Freq: Every day | ORAL | 0 refills | Status: DC

## 2019-03-03 NOTE — Telephone Encounter (Signed)
Copied from [place] [id number]. Topic: Quick Communication - Rx Refill/Question >> Mar 03, 2019  3:02 PM Rayann Heman wrote: Medication: rosuvastatin (CRESTOR) 20 MG tablet [[id number]]   pt need enough medication until next appointment on 04/06/19 Has the patient contacted their pharmacy? no Preferred Pharmacy (with phone number or street name): [facility] [place], [place] [phone] (Phone) [phone] (Fax)

## 2019-03-03 NOTE — Telephone Encounter (Signed)
Spoke with pt advised crestor 20 mg #90 with no refills to [facility] on [address].  Pt agreeable. Dg[place], CMA

## 2019-03-03 NOTE — Telephone Encounter (Signed)
Spoke with dr Carlota Raspberry ok'd to refill crestor 20 mg #90 with 0 refills.  Will contact pt via phone an advised crestor sent to pharmacy. Dgaddy, CMA

## 2019-03-10 ENCOUNTER — Telehealth: Payer: Self-pay | Admitting: *Deleted

## 2019-03-10 NOTE — Telephone Encounter (Signed)
Faxed new prescription request for Metoprolol Succ ER table to [facility], denied because needs office visit for additional refills. Confirmation page received at 7:49 am.

## 2019-03-11 ENCOUNTER — Other Ambulatory Visit: Payer: Self-pay

## 2019-03-11 DIAGNOSIS — I6523 Occlusion and stenosis of bilateral carotid arteries: Secondary | ICD-10-CM

## 2019-03-11 DIAGNOSIS — I10 Essential (primary) hypertension: Secondary | ICD-10-CM

## 2019-03-11 DIAGNOSIS — F418 Other specified anxiety disorders: Secondary | ICD-10-CM

## 2019-03-11 MED ORDER — NIFEDIPINE ER 30 MG PO TB24: 30.0000 mg | ORAL_TABLET | Freq: Every day | ORAL | 1 refills | Status: DC

## 2019-03-11 MED ORDER — METOPROLOL SUCCINATE ER 25 MG PO TB24: 12.5000 mg | ORAL_TABLET | Freq: Every day | ORAL | 1 refills | Status: DC

## 2019-03-11 MED ORDER — ALPRAZOLAM 0.25 MG PO TABS: 0.2500 mg | ORAL_TABLET | Freq: Every day | ORAL | 0 refills | Status: DC | PRN

## 2019-03-11 NOTE — Telephone Encounter (Signed)
Controlled substance database (PDMP) reviewed. No concerns appreciated. Last alprazolam refill 12/10/18 for #30.  Refills granted, keep upcoming appointment

## 2019-03-11 NOTE — Telephone Encounter (Signed)
Patient is requesting a refill of the following medications: Requested Prescriptions   Pending Prescriptions Disp Refills  . ALPRAZolam (XANAX) 0.25 MG tablet 30 tablet 0    Sig: Take 1 tablet (0.25 mg total) by mouth daily as needed. for anxiety  . metoprolol succinate (TOPROL-XL) 25 MG 24 hr tablet 45 tablet 3    Sig: Take 0.5 tablets (12.5 mg total) by mouth daily. Take at lunch or in the afternoon  . NIFEdipine (ADALAT CC) 30 MG 24 hr tablet 90 tablet 3    Sig: Take 1 tablet (30 mg total) by mouth daily.    Date of patient request: 02/27/2019 Last office visit: 12/10/18 with Gerhard Perches 10/10/18 Date of last refill: xanax 12/10/18 #30 tabs, 06/0//19 nifedipine 30 mg #90 3 refills,  05/30/18 metoprolol # 45  Last refill amount: Follow up time period per chart: n/a  Please advise. [place]

## 2019-03-31 ENCOUNTER — Encounter: Payer: Commercial Managed Care - PPO | Admitting: Obstetrics & Gynecology

## 2019-04-06 ENCOUNTER — Other Ambulatory Visit: Payer: Self-pay

## 2019-04-06 ENCOUNTER — Telehealth: Payer: Commercial Managed Care - PPO | Admitting: Family Medicine

## 2019-04-06 ENCOUNTER — Ambulatory Visit: Payer: Self-pay | Admitting: *Deleted

## 2019-04-06 NOTE — Telephone Encounter (Signed)
Patient phoned in requesting refills for her medications. Chart review shows an appointment @3 :40p. Informed her to answer when she receives a call back from the physician.

## 2019-05-21 ENCOUNTER — Encounter: Payer: Self-pay | Admitting: Emergency Medicine

## 2019-05-21 ENCOUNTER — Ambulatory Visit: Payer: Commercial Managed Care - PPO | Admitting: Emergency Medicine

## 2019-05-21 ENCOUNTER — Other Ambulatory Visit: Payer: Self-pay

## 2019-05-21 VITALS — BP 133/86 | HR 73 | Temp 98.2°F | Resp 12 | Wt 192.4 lb

## 2019-05-21 DIAGNOSIS — I10 Essential (primary) hypertension: Secondary | ICD-10-CM | POA: Diagnosis not present

## 2019-05-21 DIAGNOSIS — F418 Other specified anxiety disorders: Secondary | ICD-10-CM | POA: Diagnosis not present

## 2019-05-21 MED ORDER — ALPRAZOLAM 0.5 MG PO TABS: 0.5000 mg | ORAL_TABLET | Freq: Every day | ORAL | 1 refills | Status: DC | PRN

## 2019-05-21 NOTE — Progress Notes (Signed)
BP Readings from Last 3 Encounters:  05/21/19 133/86  02/04/19 138/86  12/19/18 114/62   Emily [name] [facility] 63 y.o.   Chief Complaint  Patient presents with  . Headache    Had headache on Sunday,Monday, and last night but today its better. Bp was 180/95 on monday bp yesterday was 145/90    HISTORY OF PRESENT ILLNESS: This is a 63 y.o. female developed headache last Sunday at home, blood pressure reading was 180/96.  Took extra blood pressure medication and Xanax and felt better.  Blood pressure has been high the past several days so she has been taking full dose of metoprolol XL with good results.  Today feels better. Denies chest pain or difficulty breathing.  No flulike symptoms.  Denies syncope or neurological symptoms.  No other significant symptoms.  HPI   Prior to Admission medications   Medication Sig Start Date End Date Taking? Authorizing Provider  ALPRAZolam (XANAX) 0.25 MG tablet Take 1 tablet (0.25 mg total) by mouth daily as needed. for anxiety 03/11/19  Yes Wendie Agreste, MD  CALCIUM PO Take 1 tablet by mouth daily.    Yes [provider]  esomeprazole (NEXIUM) 40 MG capsule Take 1 capsule (40 mg total) daily at 12 noon by mouth. 11/01/17  Yes Koben Daman, Ines Bloomer, MD  metoprolol succinate (TOPROL-XL) 25 MG 24 hr tablet Take 0.5 tablets (12.5 mg total) by mouth daily. Take at lunch or in the afternoon 03/11/19 06/09/19 Yes Wendie Agreste, MD  NIFEdipine (ADALAT CC) 30 MG 24 hr tablet Take 1 tablet (30 mg total) by mouth daily. 03/11/19 06/09/19 Yes Wendie Agreste, MD  rosuvastatin (CRESTOR) 20 MG tablet Take 1 tablet (20 mg total) by mouth daily. 03/03/19  Yes Wendie Agreste, MD    Allergies  Allergen Reactions  . Ibuprofen   . Aspirin Hives  . Atorvastatin     Yellow fingernails  . Nsaids Hives    Patient Active Problem List   Diagnosis Date Noted  . Carotid artery disease ([place]) 12/19/2018  . Bilateral carotid artery stenosis, R- 40-59%, L  1-39% 11/2017 03/25/2018  . Dyslipidemia 03/25/2018  . GERD (gastroesophageal reflux disease) 04/12/2016  . Chronic high back pain 09/24/2015  . HTN (hypertension) 10/03/2012  . Hyperlipidemia 10/03/2012  . DYSPEPSIA&OTHER [facility] DISORDERS FUNCTION STOMACH 07/20/2009    Past Medical History:  Diagnosis Date  . Abdominal pain, chronic, epigastric 11/02/2015   Started amitriptyline at hospitalization 04/13/2016 EGD x 2 negative Also w/ chronic chest wall and back pain Would try not to work up further   . Anxiety   . Arthritis   . Back pain   . Beta thalassemia minor    diagnosed by hematology Dr. Jennette Kettle, also with sickle cell trait  . Chronic high back pain 09/24/2015  . Colon polyps   . GERD (gastroesophageal reflux disease)   . Hyperlipidemia   . Hypertension   . Sickle cell trait ([place])    diagnosed by hematology Dr. Lindi Adie, coexsisting with beta-thalassemia minor    Past Surgical History:  Procedure Laterality Date  . ABDOMINAL HYSTERECTOMY    . COLONOSCOPY    . ESOPHAGOGASTRODUODENOSCOPY    . ESOPHAGOGASTRODUODENOSCOPY [name] 04/13/2016   Procedure: ESOPHAGOGASTRODUODENOSCOPY (EGD);  Surgeon: Gatha Mayer, MD;  Location: [facility];  Service: Endoscopy;  Laterality: [name];    Social History   Socioeconomic History  . Marital status: Married    Spouse name: Not on file  . Number of children: 3  .  Years of education: Not on file  . Highest education level: Not on file  Occupational History  . Not on file  Social Needs  . Financial resource strain: Not on file  . Food insecurity:    Worry: Not on file    Inability: Not on file  . Transportation needs:    Medical: Not on file    Non-medical: Not on file  Tobacco Use  . Smoking status: Never Smoker  . Smokeless tobacco: Never Used  Substance and Sexual Activity  . Alcohol use: No    Alcohol/week: 0.0 standard drinks  . Drug use: No  . Sexual activity: Yes    Comment: 1st intercourse- 34, partner-1, married- 12  yrs   Lifestyle  . Physical activity:    Days per week: Not on file    Minutes per session: Not on file  . Stress: Not on file  Relationships  . Social connections:    Talks on phone: Not on file    Gets together: Not on file    Attends religious service: Not on file    Active member of club or organization: Not on file    Attends meetings of clubs or organizations: Not on file    Relationship status: Not on file  . Intimate partner violence:    Fear of current or ex partner: Not on file    Emotionally abused: Not on file    Physically abused: Not on file    Forced sexual activity: Not on file  Other Topics Concern  . Not on file  Social History Narrative   Lives with husband and works at [employer] History  Problem Relation Age of Onset  . Aneurysm Mother   . Diabetes Father   . Colon cancer Brother 96  . Liver cancer Brother   . Esophageal cancer Neg Hx   . Stomach cancer Neg Hx   . Pancreatic cancer Neg Hx      Review of Systems  Constitutional: Negative.  Negative for chills and fever.  HENT: Negative.  Negative for hearing loss and sore throat.   Eyes: Negative for blurred vision, double vision and pain.  Respiratory: Negative.  Negative for cough and shortness of breath.   Cardiovascular: Negative.  Negative for chest pain and palpitations.  Gastrointestinal: Negative.  Negative for abdominal pain, diarrhea, nausea and vomiting.  Genitourinary: Negative.  Negative for dysuria.  Musculoskeletal: Negative.  Negative for myalgias.  Skin: Negative.  Negative for rash.  Neurological: Positive for headaches. Negative for tingling, sensory change and focal weakness.  Endo/Heme/Allergies: Negative.   All other systems reviewed and are negative.  Vitals:   05/21/19 1001  BP: 133/86  Pulse: 73  Resp: 12  Temp: 98.2 F (36.8 C)  SpO2: 96%     Physical Exam Vitals signs reviewed.  Constitutional:      Appearance: She is well-developed.  HENT:      Head: Normocephalic and atraumatic.  Eyes:     Extraocular Movements: Extraocular movements intact.     Pupils: Pupils are equal, round, and reactive to light.  Neck:     Musculoskeletal: Normal range of motion and neck supple.  Cardiovascular:     Rate and Rhythm: Normal rate and regular rhythm.     Pulses: Normal pulses.     Heart sounds: Normal heart sounds.  Pulmonary:     Effort: Pulmonary effort is normal.     Breath sounds: Normal breath sounds.  Musculoskeletal: Normal  range of motion.  Skin:    General: Skin is warm and dry.  Neurological:     General: No focal deficit present.     Mental Status: She is alert and oriented to person, place, and time.     Cranial Nerves: No cranial nerve deficit.     Sensory: No sensory deficit.     Motor: No weakness.     Coordination: Coordination normal.     Gait: Gait normal.  Psychiatric:        Mood and Affect: Mood normal.        Behavior: Behavior normal.    A total of 25 minutes was spent in the room with the patient, greater than 50% of which was in counseling/coordination of care regarding hypertension and its management.  Cardiovascular risks associated with hypertension discussed with patient.  Dose adjustment of medication discussed with patient.  Advised to increase metoprolol dose to 25 mg a day.DASHDiet discussed with patient.  Advised to increase regular physical activity.  Situational anxiety and how to use Xanax responsibly discussed with patient.  Prognosis and need for follow-up also discussed.   ASSESSMENT & PLAN: Darcee was seen today for headache.  Diagnoses and all orders for this visit:  Uncontrolled hypertension  Essential hypertension  Situational anxiety -     ALPRAZolam (XANAX) 0.5 MG tablet; Take 1 tablet (0.5 mg total) by mouth daily as needed for anxiety.    Patient Instructions    Take full dose of metoprolol succinate 25 mg daily. Continue nifedipine 30 mg daily.   If you have lab work done  today you will be contacted with your lab results within the next 2 weeks.  If you have not heard from [place] then please contact us. The fastest way to get your results is to register for My Chart.   IF you received an x-ray today, you will receive an invoice from [facility]. Please contact [facility] at [phone] with questions or concerns regarding your invoice.   IF you received labwork today, you will receive an invoice from [facility]. Please contact [facility] at [phone] with questions or concerns regarding your invoice.   Our billing staff will not be able to assist you with questions regarding bills from these companies.  You will be contacted with the lab results as soon as they are available. The fastest way to get your results is to activate your My Chart account. Instructions are located on the last page of this paperwork. If you have not heard from [place] regarding the results in 2 weeks, please contact this office.      Hypertension Hypertension, commonly called high blood pressure, is when the force of blood pumping through the arteries is too strong. The arteries are the blood vessels that carry blood from the heart throughout the body. Hypertension forces the heart to work harder to pump blood and may cause arteries to become narrow or stiff. Having untreated or uncontrolled hypertension can cause heart attacks, strokes, kidney disease, and other problems. A blood pressure reading consists of a higher number over a lower number. Ideally, your blood pressure should be below 120/80. The first ("top") number is called the systolic pressure. It is a measure of the pressure in your arteries as your heart beats. The second ("bottom") number is called the diastolic pressure. It is a measure of the pressure in your arteries as the heart relaxes. What are the causes? The cause of this condition is not known. What increases the  risk? Some risk factors for high blood  pressure are under your control. Others are not. Factors you can change  Smoking.  Having type 2 diabetes mellitus, high cholesterol, or both.  Not getting enough exercise or physical activity.  Being overweight.  Having too much fat, sugar, calories, or salt (sodium) in your diet.  Drinking too much alcohol. Factors that are difficult or impossible to change  Having chronic kidney disease.  Having a family history of high blood pressure.  Age. Risk increases with age.  Race. You may be at higher risk if you are African-American.  Gender. Men are at higher risk than women before age 35. After age 60, women are at higher risk than men.  Having obstructive sleep apnea.  Stress. What are the signs or symptoms? Extremely high blood pressure (hypertensive crisis) may cause:  Headache.  Anxiety.  Shortness of breath.  Nosebleed.  Nausea and vomiting.  Severe chest pain.  Jerky movements you cannot control (seizures). How is this diagnosed? This condition is diagnosed by measuring your blood pressure while you are seated, with your arm resting on a surface. The cuff of the blood pressure monitor will be placed directly against the skin of your upper arm at the level of your heart. It should be measured at least twice using the same arm. Certain conditions can cause a difference in blood pressure between your right and left arms. Certain factors can cause blood pressure readings to be lower or higher than normal (elevated) for a short period of time:  When your blood pressure is higher when you are in a health care provider's office than when you are at home, this is called white coat hypertension. Most people with this condition do not need medicines.  When your blood pressure is higher at home than when you are in a health care provider's office, this is called masked hypertension. Most people with this condition may need medicines to control blood pressure. If you have  a high blood pressure reading during one visit or you have normal blood pressure with other risk factors:  You may be asked to return on a different day to have your blood pressure checked again.  You may be asked to monitor your blood pressure at home for 1 week or longer. If you are diagnosed with hypertension, you may have other blood or imaging tests to help your health care provider understand your overall risk for other conditions. How is this treated? This condition is treated by making healthy lifestyle changes, such as eating healthy foods, exercising more, and reducing your alcohol intake. Your health care provider may prescribe medicine if lifestyle changes are not enough to get your blood pressure under control, and if:  Your systolic blood pressure is above 130.  Your diastolic blood pressure is above 80. Your personal target blood pressure may vary depending on your medical conditions, your age, and other factors. Follow these instructions at home: Eating and drinking   Eat a diet that is high in fiber and potassium, and low in sodium, added sugar, and fat. An example eating plan is called the DASH (Dietary Approaches to Stop Hypertension) diet. To eat this way: ? Eat plenty of fresh fruits and vegetables. Try to fill half of your plate at each meal with fruits and vegetables. ? Eat whole grains, such as whole wheat pasta, brown rice, or whole grain bread. Fill about one quarter of your plate with whole grains. ? Eat or drink low-fat dairy  products, such as skim milk or low-fat yogurt. ? Avoid fatty cuts of meat, processed or cured meats, and poultry with skin. Fill about one quarter of your plate with lean proteins, such as fish, chicken without skin, beans, eggs, and tofu. ? Avoid premade and processed foods. These tend to be higher in sodium, added sugar, and fat.  Reduce your daily sodium intake. Most people with hypertension should eat less than 1,500 mg of sodium a day.   Limit alcohol intake to no more than 1 drink a day for nonpregnant women and 2 drinks a day for men. One drink equals 12 oz of beer, 5 oz of wine, or 1 oz of hard liquor. Lifestyle   Work with your health care provider to maintain a healthy body weight or to lose weight. Ask what an ideal weight is for you.  Get at least 30 minutes of exercise that causes your heart to beat faster (aerobic exercise) most days of the week. Activities may include walking, swimming, or biking.  Include exercise to strengthen your muscles (resistance exercise), such as pilates or lifting weights, as part of your weekly exercise routine. Try to do these types of exercises for 30 minutes at least 3 days a week.  Do not use any products that contain nicotine or tobacco, such as cigarettes and e-cigarettes. If you need help quitting, ask your health care provider.  Monitor your blood pressure at home as told by your health care provider.  Keep all follow-up visits as told by your health care provider. This is important. Medicines  Take over-the-counter and prescription medicines only as told by your health care provider. Follow directions carefully. Blood pressure medicines must be taken as prescribed.  Do not skip doses of blood pressure medicine. Doing this puts you at risk for problems and can make the medicine less effective.  Ask your health care provider about side effects or reactions to medicines that you should watch for. Contact a health care provider if:  You think you are having a reaction to a medicine you are taking.  You have headaches that keep coming back (recurring).  You feel dizzy.  You have swelling in your ankles.  You have trouble with your vision. Get help right away if:  You develop a severe headache or confusion.  You have unusual weakness or numbness.  You feel faint.  You have severe pain in your chest or abdomen.  You vomit repeatedly.  You have trouble breathing.  Summary  Hypertension is when the force of blood pumping through your arteries is too strong. If this condition is not controlled, it may put you at risk for serious complications.  Your personal target blood pressure may vary depending on your medical conditions, your age, and other factors. For most people, a normal blood pressure is less than 120/80.  Hypertension is treated with lifestyle changes, medicines, or a combination of both. Lifestyle changes include weight loss, eating a healthy, low-sodium diet, exercising more, and limiting alcohol. This information is not intended to replace advice given to you by your health care provider. Make sure you discuss any questions you have with your health care provider. Document Released: 12/10/2005 Document Revised: 11/07/2016 Document Reviewed: 11/07/2016 Elsevier Interactive Patient Education  2019 [employer]      Agustina Caroli, MD [facility]

## 2019-05-21 NOTE — Patient Instructions (Addendum)
Take full dose of metoprolol succinate 25 mg daily. Continue nifedipine 30 mg daily.   If you have lab work done today you will be contacted with your lab results within the next 2 weeks.  If you have not heard from [place] then please contact us. The fastest way to get your results is to register for My Chart.   IF you received an x-ray today, you will receive an invoice from [facility]. Please contact [facility] at [phone] with questions or concerns regarding your invoice.   IF you received labwork today, you will receive an invoice from [facility]. Please contact [facility] at [phone] with questions or concerns regarding your invoice.   Our billing staff will not be able to assist you with questions regarding bills from these companies.  You will be contacted with the lab results as soon as they are available. The fastest way to get your results is to activate your My Chart account. Instructions are located on the last page of this paperwork. If you have not heard from [place] regarding the results in 2 weeks, please contact this office.      Hypertension Hypertension, commonly called high blood pressure, is when the force of blood pumping through the arteries is too strong. The arteries are the blood vessels that carry blood from the heart throughout the body. Hypertension forces the heart to work harder to pump blood and may cause arteries to become narrow or stiff. Having untreated or uncontrolled hypertension can cause heart attacks, strokes, kidney disease, and other problems. A blood pressure reading consists of a higher number over a lower number. Ideally, your blood pressure should be below 120/80. The first ("top") number is called the systolic pressure. It is a measure of the pressure in your arteries as your heart beats. The second ("bottom") number is called the diastolic pressure. It is a measure of the pressure in your arteries as the heart relaxes. What are  the causes? The cause of this condition is not known. What increases the risk? Some risk factors for high blood pressure are under your control. Others are not. Factors you can change  Smoking.  Having type 2 diabetes mellitus, high cholesterol, or both.  Not getting enough exercise or physical activity.  Being overweight.  Having too much fat, sugar, calories, or salt (sodium) in your diet.  Drinking too much alcohol. Factors that are difficult or impossible to change  Having chronic kidney disease.  Having a family history of high blood pressure.  Age. Risk increases with age.  Race. You may be at higher risk if you are African-American.  Gender. Men are at higher risk than women before age 68. After age 31, women are at higher risk than men.  Having obstructive sleep apnea.  Stress. What are the signs or symptoms? Extremely high blood pressure (hypertensive crisis) may cause:  Headache.  Anxiety.  Shortness of breath.  Nosebleed.  Nausea and vomiting.  Severe chest pain.  Jerky movements you cannot control (seizures). How is this diagnosed? This condition is diagnosed by measuring your blood pressure while you are seated, with your arm resting on a surface. The cuff of the blood pressure monitor will be placed directly against the skin of your upper arm at the level of your heart. It should be measured at least twice using the same arm. Certain conditions can cause a difference in blood pressure between your right and left arms. Certain factors can cause blood pressure readings to be lower  or higher than normal (elevated) for a short period of time:  When your blood pressure is higher when you are in a health care provider's office than when you are at home, this is called white coat hypertension. Most people with this condition do not need medicines.  When your blood pressure is higher at home than when you are in a health care provider's office, this is  called masked hypertension. Most people with this condition may need medicines to control blood pressure. If you have a high blood pressure reading during one visit or you have normal blood pressure with other risk factors:  You may be asked to return on a different day to have your blood pressure checked again.  You may be asked to monitor your blood pressure at home for 1 week or longer. If you are diagnosed with hypertension, you may have other blood or imaging tests to help your health care provider understand your overall risk for other conditions. How is this treated? This condition is treated by making healthy lifestyle changes, such as eating healthy foods, exercising more, and reducing your alcohol intake. Your health care provider may prescribe medicine if lifestyle changes are not enough to get your blood pressure under control, and if:  Your systolic blood pressure is above 130.  Your diastolic blood pressure is above 80. Your personal target blood pressure may vary depending on your medical conditions, your age, and other factors. Follow these instructions at home: Eating and drinking   Eat a diet that is high in fiber and potassium, and low in sodium, added sugar, and fat. An example eating plan is called the DASH (Dietary Approaches to Stop Hypertension) diet. To eat this way: ? Eat plenty of fresh fruits and vegetables. Try to fill half of your plate at each meal with fruits and vegetables. ? Eat whole grains, such as whole wheat pasta, brown rice, or whole grain bread. Fill about one quarter of your plate with whole grains. ? Eat or drink low-fat dairy products, such as skim milk or low-fat yogurt. ? Avoid fatty cuts of meat, processed or cured meats, and poultry with skin. Fill about one quarter of your plate with lean proteins, such as fish, chicken without skin, beans, eggs, and tofu. ? Avoid premade and processed foods. These tend to be higher in sodium, added sugar, and  fat.  Reduce your daily sodium intake. Most people with hypertension should eat less than 1,500 mg of sodium a day.  Limit alcohol intake to no more than 1 drink a day for nonpregnant women and 2 drinks a day for men. One drink equals 12 oz of beer, 5 oz of wine, or 1 oz of hard liquor. Lifestyle   Work with your health care provider to maintain a healthy body weight or to lose weight. Ask what an ideal weight is for you.  Get at least 30 minutes of exercise that causes your heart to beat faster (aerobic exercise) most days of the week. Activities may include walking, swimming, or biking.  Include exercise to strengthen your muscles (resistance exercise), such as pilates or lifting weights, as part of your weekly exercise routine. Try to do these types of exercises for 30 minutes at least 3 days a week.  Do not use any products that contain nicotine or tobacco, such as cigarettes and e-cigarettes. If you need help quitting, ask your health care provider.  Monitor your blood pressure at home as told by your health care provider.  Keep all follow-up visits as told by your health care provider. This is important. Medicines  Take over-the-counter and prescription medicines only as told by your health care provider. Follow directions carefully. Blood pressure medicines must be taken as prescribed.  Do not skip doses of blood pressure medicine. Doing this puts you at risk for problems and can make the medicine less effective.  Ask your health care provider about side effects or reactions to medicines that you should watch for. Contact a health care provider if:  You think you are having a reaction to a medicine you are taking.  You have headaches that keep coming back (recurring).  You feel dizzy.  You have swelling in your ankles.  You have trouble with your vision. Get help right away if:  You develop a severe headache or confusion.  You have unusual weakness or numbness.  You  feel faint.  You have severe pain in your chest or abdomen.  You vomit repeatedly.  You have trouble breathing. Summary  Hypertension is when the force of blood pumping through your arteries is too strong. If this condition is not controlled, it may put you at risk for serious complications.  Your personal target blood pressure may vary depending on your medical conditions, your age, and other factors. For most people, a normal blood pressure is less than 120/80.  Hypertension is treated with lifestyle changes, medicines, or a combination of both. Lifestyle changes include weight loss, eating a healthy, low-sodium diet, exercising more, and limiting alcohol. This information is not intended to replace advice given to you by your health care provider. Make sure you discuss any questions you have with your health care provider. Document Released: 12/10/2005 Document Revised: 11/07/2016 Document Reviewed: 11/07/2016 Elsevier Interactive Patient Education  2019 [employer].

## 2019-06-18 ENCOUNTER — Other Ambulatory Visit: Payer: Self-pay | Admitting: Physician Assistant

## 2019-06-18 DIAGNOSIS — I10 Essential (primary) hypertension: Secondary | ICD-10-CM

## 2019-06-18 NOTE — Telephone Encounter (Signed)
Forwarding medication refill to provider for review. 

## 2019-06-18 NOTE — Telephone Encounter (Signed)
Crestor refilled once, but is overdue for labs.  Please call and schedule appointment to discuss medication and updated lab work.  I do see that she has Dr. Mitchel Honour listed as primary care provider now.  Either one of [place] is fine.

## 2019-06-20 ENCOUNTER — Other Ambulatory Visit: Payer: Self-pay | Admitting: Physician Assistant

## 2019-06-20 DIAGNOSIS — I10 Essential (primary) hypertension: Secondary | ICD-10-CM

## 2019-06-30 ENCOUNTER — Encounter: Payer: Self-pay | Admitting: Emergency Medicine

## 2019-06-30 ENCOUNTER — Telehealth (INDEPENDENT_AMBULATORY_CARE_PROVIDER_SITE_OTHER): Payer: Commercial Managed Care - PPO | Admitting: Emergency Medicine

## 2019-06-30 ENCOUNTER — Other Ambulatory Visit: Payer: Self-pay

## 2019-06-30 ENCOUNTER — Telehealth: Payer: Self-pay | Admitting: *Deleted

## 2019-06-30 DIAGNOSIS — M545 Low back pain, unspecified: Secondary | ICD-10-CM | POA: Insufficient documentation

## 2019-06-30 DIAGNOSIS — G8929 Other chronic pain: Secondary | ICD-10-CM

## 2019-06-30 MED ORDER — MELOXICAM 15 MG PO TABS: 15.0000 mg | ORAL_TABLET | Freq: Every day | ORAL | 1 refills | Status: DC

## 2019-06-30 NOTE — Telephone Encounter (Signed)
Called patient to triage for appointment using [place] [phone]. Left message in mobile voice mail to call back to be triaged for appointment at 4:40 today.

## 2019-06-30 NOTE — Progress Notes (Signed)
Telemedicine Encounter- SOAP NOTE Established Patient  This telephone encounter was conducted with the patient's (or proxy's) verbal consent via audio telecommunications: yes/no: Yes Patient was instructed to have this encounter in a suitably private space; and to only have persons present to whom they give permission to participate. In addition, patient identity was confirmed by use of name plus two identifiers (DOB and address).  I discussed the limitations, risks, security and privacy concerns of performing an evaluation and management service by telephone and the availability of in person appointments. I also discussed with the patient that there may be a patient responsible charge related to this service. The patient expressed understanding and agreed to proceed.  I spent a total of TIME; 0 MIN TO 60 MIN: 15 minutes talking with the patient or their proxy.  No chief complaint on file. Back pain  Subjective   Emily [name] is a 63 y.o. female established patient. Telephone visit today for chronic back pain but worse the past couple of weeks.  Back Pain This is a recurrent problem. The current episode started 1 to 4 weeks ago. The problem occurs constantly. The problem has been waxing and waning since onset. The pain is present in the lumbar spine. The quality of the pain is described as aching. The pain does not radiate. The pain is at a severity of 7/10. The pain is moderate. The symptoms are aggravated by bending, position, twisting and standing. Stiffness is present all day. Pertinent negatives include no abdominal pain, bladder incontinence, bowel incontinence, chest pain, dysuria, fever, headaches, leg pain, numbness, paresis, paresthesias, pelvic pain, perianal numbness, tingling, weakness or weight loss. She has tried analgesics, heat and bed rest for the symptoms. The treatment provided mild relief.     Patient Active Problem List   Diagnosis Date Noted  . Carotid  artery disease ([place]) 12/19/2018  . Bilateral carotid artery stenosis, R- 40-59%, L 1-39% 11/2017 03/25/2018  . Dyslipidemia 03/25/2018  . GERD (gastroesophageal reflux disease) 04/12/2016  . Chronic high back pain 09/24/2015  . HTN (hypertension) 10/03/2012  . Hyperlipidemia 10/03/2012  . DYSPEPSIA&OTHER [facility] DISORDERS FUNCTION STOMACH 07/20/2009    Past Medical History:  Diagnosis Date  . Abdominal pain, chronic, epigastric 11/02/2015   Started amitriptyline at hospitalization 04/13/2016 EGD x 2 negative Also w/ chronic chest wall and back pain Would try not to work up further   . Anxiety   . Arthritis   . Back pain   . Beta thalassemia minor    diagnosed by hematology Dr. Jennette Kettle, also with sickle cell trait  . Chronic high back pain 09/24/2015  . Colon polyps   . GERD (gastroesophageal reflux disease)   . Hyperlipidemia   . Hypertension   . Sickle cell trait ([place])    diagnosed by hematology Dr. Lindi Adie, coexsisting with beta-thalassemia minor    Current Outpatient Medications  Medication Sig Dispense Refill  . ALPRAZolam (XANAX) 0.5 MG tablet Take 1 tablet (0.5 mg total) by mouth daily as needed for anxiety. 30 tablet 1  . CALCIUM PO Take 1 tablet by mouth daily.     Marland Kitchen esomeprazole (NEXIUM) 40 MG capsule Take 1 capsule (40 mg total) daily at 12 noon by mouth. 90 capsule 3  . meloxicam (MOBIC) 15 MG tablet Take 1 tablet (15 mg total) by mouth daily. 30 tablet 1  . metoprolol succinate (TOPROL-XL) 25 MG 24 hr tablet Take 0.5 tablets (12.5 mg total) by mouth daily. Take at lunch or in  the afternoon 45 tablet 1  . NIFEdipine (ADALAT CC) 30 MG 24 hr tablet TAKE 1 TABLET(30 MG) BY MOUTH DAILY 90 tablet 1  . rosuvastatin (CRESTOR) 20 MG tablet TAKE 1 TABLET(20 MG) BY MOUTH DAILY 90 tablet 0   No current facility-administered medications for this visit.     Allergies  Allergen Reactions  . Ibuprofen   . Aspirin Hives  . Atorvastatin     Yellow fingernails  . Nsaids Hives     Social History   Socioeconomic History  . Marital status: Married    Spouse name: Not on file  . Number of children: 3  . Years of education: Not on file  . Highest education level: Not on file  Occupational History  . Not on file  Social Needs  . Financial resource strain: Not on file  . Food insecurity    Worry: Not on file    Inability: Not on file  . Transportation needs    Medical: Not on file    Non-medical: Not on file  Tobacco Use  . Smoking status: Never Smoker  . Smokeless tobacco: Never Used  Substance and Sexual Activity  . Alcohol use: No    Alcohol/week: 0.0 standard drinks  . Drug use: No  . Sexual activity: Yes    Comment: 1st intercourse- 8, partner-1, married- 61 yrs   Lifestyle  . Physical activity    Days per week: Not on file    Minutes per session: Not on file  . Stress: Not on file  Relationships  . Social Herbalist on phone: Not on file    Gets together: Not on file    Attends religious service: Not on file    Active member of club or organization: Not on file    Attends meetings of clubs or organizations: Not on file    Relationship status: Not on file  . Intimate partner violence    Fear of current or ex partner: Not on file    Emotionally abused: Not on file    Physically abused: Not on file    Forced sexual activity: Not on file  Other Topics Concern  . Not on file  Social History Narrative   Lives with husband and works at [employer]  Constitutional: Negative.  Negative for fever and weight loss.  HENT: Negative for congestion and sore throat.   Respiratory: Negative.  Negative for cough and shortness of breath.   Cardiovascular: Negative.  Negative for chest pain and palpitations.  Gastrointestinal: Negative.  Negative for abdominal pain, bowel incontinence, nausea and vomiting.  Genitourinary: Negative for bladder incontinence, dysuria and pelvic pain.  Musculoskeletal: Positive for back pain.   Skin: Negative.   Neurological: Negative.  Negative for dizziness, tingling, sensory change, focal weakness, weakness, numbness, headaches and paresthesias.  All other systems reviewed and are negative.   Objective   Vitals as reported by the patient: There were no vitals filed for this visit. Alert and oriented x3 in no apparent respiratory distress There are no diagnoses linked to this encounter. Diagnoses and all orders for this visit:  Acute exacerbation of chronic low back pain  Chronic bilateral low back pain without sciatica  Other orders -     meloxicam (MOBIC) 15 MG tablet; Take 1 tablet (15 mg total) by mouth daily.   Clinically stable.  No red flag signs or symptoms.  Patient has tried meloxicam in the past with good  success.  Wants to take it again. Has been seen by orthopedist in the recent past and advised to get surgery.  Needs to follow-up with them for further evaluation.  I discussed the assessment and treatment plan with the patient. The patient was provided an opportunity to ask questions and all were answered. The patient agreed with the plan and demonstrated an understanding of the instructions.   The patient was advised to call back or seek an in-person evaluation if the symptoms worsen or if the condition fails to improve as anticipated.  I provided 15 minutes of non-face-to-face time during this encounter.  Horald Pollen, MD  Primary Care at [facility]

## 2019-08-25 ENCOUNTER — Telehealth: Payer: Commercial Managed Care - PPO | Admitting: Emergency Medicine

## 2019-08-25 ENCOUNTER — Other Ambulatory Visit: Payer: Self-pay

## 2019-08-26 ENCOUNTER — Encounter: Payer: Self-pay | Admitting: Emergency Medicine

## 2019-08-26 ENCOUNTER — Ambulatory Visit: Payer: Commercial Managed Care - PPO | Admitting: Emergency Medicine

## 2019-09-03 ENCOUNTER — Telehealth (INDEPENDENT_AMBULATORY_CARE_PROVIDER_SITE_OTHER): Payer: Commercial Managed Care - PPO | Admitting: Emergency Medicine

## 2019-09-03 ENCOUNTER — Other Ambulatory Visit: Payer: Self-pay

## 2019-09-03 ENCOUNTER — Telehealth: Payer: Self-pay | Admitting: *Deleted

## 2019-09-03 ENCOUNTER — Encounter: Payer: Self-pay | Admitting: Emergency Medicine

## 2019-09-03 DIAGNOSIS — E785 Hyperlipidemia, unspecified: Secondary | ICD-10-CM | POA: Diagnosis not present

## 2019-09-03 DIAGNOSIS — I10 Essential (primary) hypertension: Secondary | ICD-10-CM | POA: Diagnosis not present

## 2019-09-03 DIAGNOSIS — F418 Other specified anxiety disorders: Secondary | ICD-10-CM

## 2019-09-03 MED ORDER — ROSUVASTATIN CALCIUM 20 MG PO TABS: 20.0000 mg | ORAL_TABLET | Freq: Every day | ORAL | 3 refills | Status: DC

## 2019-09-03 MED ORDER — ALPRAZOLAM 0.5 MG PO TABS: 0.5000 mg | ORAL_TABLET | Freq: Every day | ORAL | 1 refills | Status: DC | PRN

## 2019-09-03 NOTE — Telephone Encounter (Signed)
Called patient to triage using [place] Interpre[employer] # [phone], no answer left message voice mail to call back.

## 2019-09-03 NOTE — Telephone Encounter (Signed)
Called patient for the second time to triage using [employer]pre[employer] [phone]. Left message in voice mail.

## 2019-09-03 NOTE — Progress Notes (Signed)
Telemedicine Encounter- SOAP NOTE Established Patient  This telephone encounter was conducted with the patient's (or proxy's) verbal consent via audio telecommunications: yes/no: Yes Patient was instructed to have this encounter in a suitably private space; and to only have persons present to whom they give permission to participate. In addition, patient identity was confirmed by use of name plus two identifiers (DOB and address).  I discussed the limitations, risks, security and privacy concerns of performing an evaluation and management service by telephone and the availability of in person appointments. I also discussed with the patient that there may be a patient responsible charge related to this service. The patient expressed understanding and agreed to proceed.  I spent a total of TIME; 0 MIN TO 60 MIN: 15 minutes talking with the patient or their proxy.  No chief complaint on file. General weakness and feels stressed out  Subjective   Emily [name] is a 63 y.o. female established patient. Telephone visit today complaining of feeling weak and stressed out due to pandemic.  Presently not working.  Spending most time at home.  Not sleeping well.  No other complaints or medical concerns.  Denies flulike symptoms.  No COVID symptoms.  HPI   Patient Active Problem List   Diagnosis Date Noted  . Chronic bilateral low back pain without sciatica 06/30/2019  . Carotid artery disease ([place]) 12/19/2018  . Bilateral carotid artery stenosis, R- 40-59%, L 1-39% 11/2017 03/25/2018  . Dyslipidemia 03/25/2018  . GERD (gastroesophageal reflux disease) 04/12/2016  . Chronic high back pain 09/24/2015  . HTN (hypertension) 10/03/2012  . Hyperlipidemia 10/03/2012  . DYSPEPSIA&OTHER [facility] DISORDERS FUNCTION STOMACH 07/20/2009    Past Medical History:  Diagnosis Date  . Abdominal pain, chronic, epigastric 11/02/2015   Started amitriptyline at hospitalization 04/13/2016 EGD x 2 negative Also  w/ chronic chest wall and back pain Would try not to work up further   . Anxiety   . Arthritis   . Back pain   . Beta thalassemia minor    diagnosed by hematology Dr. Jennette Kettle, also with sickle cell trait  . Chronic high back pain 09/24/2015  . Colon polyps   . GERD (gastroesophageal reflux disease)   . Hyperlipidemia   . Hypertension   . Sickle cell trait ([place])    diagnosed by hematology Dr. Lindi Adie, coexsisting with beta-thalassemia minor    Current Outpatient Medications  Medication Sig Dispense Refill  . ALPRAZolam (XANAX) 0.5 MG tablet Take 1 tablet (0.5 mg total) by mouth daily as needed for anxiety. 30 tablet 1  . CALCIUM PO Take 1 tablet by mouth daily.     Marland Kitchen esomeprazole (NEXIUM) 40 MG capsule Take 1 capsule (40 mg total) daily at 12 noon by mouth. 90 capsule 3  . meloxicam (MOBIC) 15 MG tablet Take 1 tablet (15 mg total) by mouth daily. 30 tablet 1  . metoprolol succinate (TOPROL-XL) 25 MG 24 hr tablet Take 0.5 tablets (12.5 mg total) by mouth daily. Take at lunch or in the afternoon 45 tablet 1  . NIFEdipine (ADALAT CC) 30 MG 24 hr tablet TAKE 1 TABLET(30 MG) BY MOUTH DAILY 90 tablet 1  . rosuvastatin (CRESTOR) 20 MG tablet TAKE 1 TABLET(20 MG) BY MOUTH DAILY 90 tablet 0   No current facility-administered medications for this visit.     Allergies  Allergen Reactions  . Ibuprofen   . Aspirin Hives  . Atorvastatin     Yellow fingernails  . Nsaids Hives  Social History   Socioeconomic History  . Marital status: Married    Spouse name: Not on file  . Number of children: 3  . Years of education: Not on file  . Highest education level: Not on file  Occupational History  . Not on file  Social Needs  . Financial resource strain: Not on file  . Food insecurity    Worry: Not on file    Inability: Not on file  . Transportation needs    Medical: Not on file    Non-medical: Not on file  Tobacco Use  . Smoking status: Never Smoker  . Smokeless tobacco: Never  Used  Substance and Sexual Activity  . Alcohol use: No    Alcohol/week: 0.0 standard drinks  . Drug use: No  . Sexual activity: Yes    Comment: 1st intercourse- 8, partner-1, married- 43 yrs   Lifestyle  . Physical activity    Days per week: Not on file    Minutes per session: Not on file  . Stress: Not on file  Relationships  . Social Herbalist on phone: Not on file    Gets together: Not on file    Attends religious service: Not on file    Active member of club or organization: Not on file    Attends meetings of clubs or organizations: Not on file    Relationship status: Not on file  . Intimate partner violence    Fear of current or ex partner: Not on file    Emotionally abused: Not on file    Physically abused: Not on file    Forced sexual activity: Not on file  Other Topics Concern  . Not on file  Social History Narrative   Lives with husband and works at [employer]  Constitutional: Negative.  Negative for chills and fever.  HENT: Negative.  Negative for congestion and sore throat.   Eyes: Negative.   Respiratory: Negative.  Negative for cough and shortness of breath.   Cardiovascular: Negative.  Negative for chest pain and palpitations.  Gastrointestinal: Negative.  Negative for abdominal pain, blood in stool, diarrhea, nausea and vomiting.  Genitourinary: Negative.   Musculoskeletal: Negative.  Negative for myalgias.  Skin: Negative.  Negative for rash.  Neurological: Positive for weakness. Negative for dizziness and headaches.    Objective  Alert and oriented x3 in no apparent respiratory distress Vitals as reported by the patient: There were no vitals filed for this visit.  There are no diagnoses linked to this encounter. Diagnoses and all orders for this visit:  Situational anxiety -     ALPRAZolam (XANAX) 0.5 MG tablet; Take 1 tablet (0.5 mg total) by mouth daily as needed for anxiety.  Essential hypertension   Dyslipidemia -     rosuvastatin (CRESTOR) 20 MG tablet; Take 1 tablet (20 mg total) by mouth daily.    Clinically stable.  No red flag signs or symptoms. Office visit next week. I discussed the assessment and treatment plan with the patient. The patient was provided an opportunity to ask questions and all were answered. The patient agreed with the plan and demonstrated an understanding of the instructions.   The patient was advised to call back or seek an in-person evaluation if the symptoms worsen or if the condition fails to improve as anticipated.  I provided 15 minutes of non-face-to-face time during this encounter.  Horald Pollen, MD  Primary Care at [facility]

## 2019-09-03 NOTE — Telephone Encounter (Signed)
Patient called back and asked why the doctor had not called her. I mentioned it to Dr Mitchel Honour and he called the patient.

## 2019-09-07 ENCOUNTER — Telehealth: Payer: Self-pay | Admitting: *Deleted

## 2019-09-07 ENCOUNTER — Other Ambulatory Visit: Payer: Self-pay

## 2019-09-07 ENCOUNTER — Encounter: Payer: Self-pay | Admitting: Emergency Medicine

## 2019-09-07 ENCOUNTER — Telehealth (INDEPENDENT_AMBULATORY_CARE_PROVIDER_SITE_OTHER): Payer: Commercial Managed Care - PPO | Admitting: Emergency Medicine

## 2019-09-07 DIAGNOSIS — F418 Other specified anxiety disorders: Secondary | ICD-10-CM

## 2019-09-07 DIAGNOSIS — I10 Essential (primary) hypertension: Secondary | ICD-10-CM

## 2019-09-07 DIAGNOSIS — I6523 Occlusion and stenosis of bilateral carotid arteries: Secondary | ICD-10-CM

## 2019-09-07 MED ORDER — METOPROLOL SUCCINATE ER 25 MG PO TB24: 25.0000 mg | ORAL_TABLET | Freq: Every day | ORAL | 3 refills | Status: DC

## 2019-09-07 NOTE — Progress Notes (Signed)
Telemedicine Encounter- SOAP NOTE Established Patient  This telephone encounter was conducted with the patient's (or proxy's) verbal consent via audio telecommunications: yes/no: Yes Patient was instructed to have this encounter in a suitably private space; and to only have persons present to whom they give permission to participate. In addition, patient identity was confirmed by use of name plus two identifiers (DOB and address).  I discussed the limitations, risks, security and privacy concerns of performing an evaluation and management service by telephone and the availability of in person appointments. I also discussed with the patient that there may be a patient responsible charge related to this service. The patient expressed understanding and agreed to proceed.  I spent a total of TIME; 0 MIN TO 60 MIN: 15 minutes talking with the patient or their proxy.  No chief complaint on file. Hypertension, medication refill, anxiety follow-up  Subjective   Emily [name] is a 63 y.o. female established patient. Telephone visit today for follow-up from 09/03/2019 visit.  Anxiety much controlled with Xanax.  Has history of hypertension, takes metoprolol XL 25 mg daily, needs medication refill.  Also requesting refill of rosuvastatin.  No other request or medical concerns today.  HPI   Patient Active Problem List   Diagnosis Date Noted  . Chronic bilateral low back pain without sciatica 06/30/2019  . Carotid artery disease ([place]) 12/19/2018  . Bilateral carotid artery stenosis, R- 40-59%, L 1-39% 11/2017 03/25/2018  . Dyslipidemia 03/25/2018  . GERD (gastroesophageal reflux disease) 04/12/2016  . Chronic high back pain 09/24/2015  . HTN (hypertension) 10/03/2012  . Hyperlipidemia 10/03/2012  . DYSPEPSIA&OTHER [facility] DISORDERS FUNCTION STOMACH 07/20/2009    Past Medical History:  Diagnosis Date  . Abdominal pain, chronic, epigastric 11/02/2015   Started amitriptyline at  hospitalization 04/13/2016 EGD x 2 negative Also w/ chronic chest wall and back pain Would try not to work up further   . Anxiety   . Arthritis   . Back pain   . Beta thalassemia minor    diagnosed by hematology Dr. Jennette Kettle, also with sickle cell trait  . Chronic high back pain 09/24/2015  . Colon polyps   . GERD (gastroesophageal reflux disease)   . Hyperlipidemia   . Hypertension   . Sickle cell trait ([place])    diagnosed by hematology Dr. Lindi Adie, coexsisting with beta-thalassemia minor    Current Outpatient Medications  Medication Sig Dispense Refill  . ALPRAZolam (XANAX) 0.5 MG tablet Take 1 tablet (0.5 mg total) by mouth daily as needed for anxiety. 30 tablet 1  . CALCIUM PO Take 1 tablet by mouth daily.     Marland Kitchen esomeprazole (NEXIUM) 40 MG capsule Take 1 capsule (40 mg total) daily at 12 noon by mouth. 90 capsule 3  . meloxicam (MOBIC) 15 MG tablet Take 1 tablet (15 mg total) by mouth daily. 30 tablet 1  . metoprolol succinate (TOPROL-XL) 25 MG 24 hr tablet Take 1 tablet (25 mg total) by mouth daily. Take at lunch or in the afternoon 90 tablet 3  . NIFEdipine (ADALAT CC) 30 MG 24 hr tablet TAKE 1 TABLET(30 MG) BY MOUTH DAILY 90 tablet 1  . rosuvastatin (CRESTOR) 20 MG tablet Take 1 tablet (20 mg total) by mouth daily. 90 tablet 3   No current facility-administered medications for this visit.     Allergies  Allergen Reactions  . Ibuprofen   . Aspirin Hives  . Atorvastatin     Yellow fingernails  . Nsaids Hives  Social History   Socioeconomic History  . Marital status: Married    Spouse name: Not on file  . Number of children: 3  . Years of education: Not on file  . Highest education level: Not on file  Occupational History  . Not on file  Social Needs  . Financial resource strain: Not on file  . Food insecurity    Worry: Not on file    Inability: Not on file  . Transportation needs    Medical: Not on file    Non-medical: Not on file  Tobacco Use  . Smoking  status: Never Smoker  . Smokeless tobacco: Never Used  Substance and Sexual Activity  . Alcohol use: No    Alcohol/week: 0.0 standard drinks  . Drug use: No  . Sexual activity: Yes    Comment: 1st intercourse- 27, partner-1, married- 27 yrs   Lifestyle  . Physical activity    Days per week: Not on file    Minutes per session: Not on file  . Stress: Not on file  Relationships  . Social Herbalist on phone: Not on file    Gets together: Not on file    Attends religious service: Not on file    Active member of club or organization: Not on file    Attends meetings of clubs or organizations: Not on file    Relationship status: Not on file  . Intimate partner violence    Fear of current or ex partner: Not on file    Emotionally abused: Not on file    Physically abused: Not on file    Forced sexual activity: Not on file  Other Topics Concern  . Not on file  Social History Narrative   Lives with husband and works at [employer]  Constitutional: Negative.  Negative for chills and fever.  HENT: Negative.  Negative for congestion and sore throat.   Respiratory: Negative.  Negative for cough and shortness of breath.   Cardiovascular: Negative.  Negative for chest pain and palpitations.  Gastrointestinal: Negative for abdominal pain, nausea and vomiting.  Genitourinary: Negative.   Skin: Negative.  Negative for rash.  Neurological: Negative.  Negative for dizziness and headaches.  Endo/Heme/Allergies: Negative.   All other systems reviewed and are negative.   Objective  Alert and oriented x3 in no apparent respiratory distress. Vitals as reported by the patient: There were no vitals filed for this visit.  Diagnoses and all orders for this visit:  Essential hypertension -     metoprolol succinate (TOPROL-XL) 25 MG 24 hr tablet; Take 1 tablet (25 mg total) by mouth daily. Take at lunch or in the afternoon  Bilateral carotid artery stenosis, R-  40-59%, L 1-39% 11/2017 -     metoprolol succinate (TOPROL-XL) 25 MG 24 hr tablet; Take 1 tablet (25 mg total) by mouth daily. Take at lunch or in the afternoon  Situational anxiety Comments: Much improved      I discussed the assessment and treatment plan with the patient. The patient was provided an opportunity to ask questions and all were answered. The patient agreed with the plan and demonstrated an understanding of the instructions.   The patient was advised to call back or seek an in-person evaluation if the symptoms worsen or if the condition fails to improve as anticipated.  I provided 15 minutes of non-face-to-face time during this encounter.  Horald Pollen, MD  Primary Care at [facility]

## 2019-09-07 NOTE — Telephone Encounter (Signed)
Called patient to triage for appointment using Interpreter [phone]. Patient home voice mail not set up [phone]) and mobile number [phone])  patient did not answer. Voice message was left at mobile number.

## 2019-09-19 ENCOUNTER — Other Ambulatory Visit: Payer: Self-pay | Admitting: Family Medicine

## 2019-09-19 DIAGNOSIS — I10 Essential (primary) hypertension: Secondary | ICD-10-CM

## 2019-09-19 DIAGNOSIS — I6523 Occlusion and stenosis of bilateral carotid arteries: Secondary | ICD-10-CM

## 2019-09-19 NOTE — Telephone Encounter (Signed)
Forwarding medication refill request to the clinical pool for review due to medication directions/instructions have changed.

## 2019-10-05 ENCOUNTER — Other Ambulatory Visit: Payer: Self-pay | Admitting: Emergency Medicine

## 2019-10-05 DIAGNOSIS — F418 Other specified anxiety disorders: Secondary | ICD-10-CM

## 2019-10-05 NOTE — Telephone Encounter (Signed)
Requested medication (s) are due for refill today: yes  Requested medication (s) are on the active medication list: yes  Last refill:  09/02/2019  Future visit scheduled: yes  Notes to clinic:  Refill cannot be delegated    Requested Prescriptions  Pending Prescriptions Disp Refills   ALPRAZolam (XANAX) 0.5 MG tablet [Pharmacy Med Name: ALPRAZOLAM 0.5MG  TABLETS] 30 tablet     Sig: TAKE 1 TABLET BY MOUTH EVERY DAY AS NEEDED FOR ANXIETY     Not Delegated - Psychiatry:  Anxiolytics/Hypnotics Failed - 10/05/2019 11:16 AM      Failed - This refill cannot be delegated      Failed - Urine Drug Screen completed in last 360 days.      Passed - Valid encounter within last 6 months    Recent Outpatient Visits          4 weeks ago Essential hypertension   Primary Care at [facility], Ines Bloomer, MD   1 month ago Situational anxiety   Primary Care at [place], [place], MD   3 months ago Acute exacerbation of chronic low back pain   Primary Care at [facility], Ines Bloomer, MD   4 months ago Uncontrolled hypertension   Primary Care at [facility], [place], MD   9 months ago SOB (shortness of breath)   [facility] at [facility], Lilia Argue, MD      Future Appointments            In 1 month [place], Ines Bloomer, MD Primary Care at [place], [facility]

## 2019-11-09 ENCOUNTER — Ambulatory Visit: Payer: Commercial Managed Care - PPO

## 2019-11-10 ENCOUNTER — Ambulatory Visit (INDEPENDENT_AMBULATORY_CARE_PROVIDER_SITE_OTHER): Payer: Commercial Managed Care - PPO | Admitting: Emergency Medicine

## 2019-11-10 ENCOUNTER — Other Ambulatory Visit: Payer: Self-pay

## 2019-11-10 DIAGNOSIS — Z23 Encounter for immunization: Secondary | ICD-10-CM

## 2019-11-18 ENCOUNTER — Ambulatory Visit: Payer: Commercial Managed Care - PPO | Admitting: Emergency Medicine

## 2019-12-01 ENCOUNTER — Ambulatory Visit: Payer: Commercial Managed Care - PPO | Admitting: Emergency Medicine

## 2019-12-01 ENCOUNTER — Encounter: Payer: Self-pay | Admitting: Gastroenterology

## 2019-12-03 ENCOUNTER — Other Ambulatory Visit: Payer: Self-pay

## 2019-12-03 ENCOUNTER — Ambulatory Visit: Payer: Commercial Managed Care - PPO | Admitting: Emergency Medicine

## 2019-12-03 ENCOUNTER — Encounter: Payer: Self-pay | Admitting: Emergency Medicine

## 2019-12-03 VITALS — BP 117/67 | HR 85 | Temp 98.4°F | Resp 16 | Ht 66.0 in | Wt 202.6 lb

## 2019-12-03 DIAGNOSIS — I10 Essential (primary) hypertension: Secondary | ICD-10-CM | POA: Diagnosis not present

## 2019-12-03 DIAGNOSIS — E785 Hyperlipidemia, unspecified: Secondary | ICD-10-CM

## 2019-12-03 DIAGNOSIS — I6523 Occlusion and stenosis of bilateral carotid arteries: Secondary | ICD-10-CM

## 2019-12-03 MED ORDER — NIFEDIPINE ER 30 MG PO TB24: ORAL_TABLET | ORAL | 3 refills | Status: DC

## 2019-12-03 MED ORDER — ROSUVASTATIN CALCIUM 20 MG PO TABS: 20.0000 mg | ORAL_TABLET | Freq: Every day | ORAL | 3 refills | Status: DC

## 2019-12-03 MED ORDER — METOPROLOL SUCCINATE ER 25 MG PO TB24: ORAL_TABLET | ORAL | 4 refills | Status: DC

## 2019-12-03 NOTE — Patient Instructions (Addendum)
   If you have lab work done today you will be contacted with your lab results within the next 2 weeks.  If you have not heard from us then please contact us. The fastest way to get your results is to register for My Chart.   IF you received an x-ray today, you will receive an invoice from [facility]. Please contact [facility] at [phone] with questions or concerns regarding your invoice.   IF you received labwork today, you will receive an invoice from [facility]. Please contact [facility] at [phone] with questions or concerns regarding your invoice.   Our billing staff will not be able to assist you with questions regarding bills from these companies.  You will be contacted with the lab results as soon as they are available. The fastest way to get your results is to activate your My Chart account. Instructions are located on the last page of this paperwork. If you have not heard from us regarding the results in 2 weeks, please contact this office.     Mantenimiento de la salud despus de los 65 aos de edad Health Maintenance After Age 65 Despus de los 65 aos de edad, corre un riesgo mayor de padecer ciertas enfermedades e infecciones a largo plazo, como tambin de sufrir lesiones por cadas. Las cadas son la causa principal de las fracturas de huesos y lesiones en la cabeza de personas mayores de 65 aos de edad. Recibir cuidados preventivos de forma regular puede ayudarlo a mantenerse saludable y en buen estado. Los cuidados preventivos incluyen realizarse anlisis de forma regular y realizar cambios en el estilo de vida segn las recomendaciones del mdico. Converse con el profesional que lo asiste sobre:  Las pruebas de deteccin y los anlisis que debe realizarse. Una prueba de deteccin es un estudio que se para detectar la presencia de una enfermedad cuando no tiene sntomas.  Un plan de dieta y ejercicios adecuado para usted. Qu debo saber sobre las  pruebas de deteccin y los anlisis para prevenir cadas? Realizarse pruebas de deteccin y anlisis es la mejor manera de detectar un problema de salud de forma temprana. El diagnstico y tratamiento tempranos le brindan la mejor oportunidad de controlar las afecciones mdicas que son comunes despus de los 65 aos de edad. Ciertas afecciones y elecciones de estilo de vida pueden hacer que sea ms propenso a sufrir una cada. El mdico puede recomendarle lo siguiente:  Controles regulares de la visin. Una visin deficiente y afecciones como las cataratas pueden hacer que sea ms propenso a sufrir una cada. Si usa lentes, asegrese de obtener una receta actualizada si su visin cambia.  Revisin de medicamentos. Revise regularmente con el mdico todos los medicamentos que toma, incluidos los medicamentos de venta libre. Consulte al mdico sobre los efectos secundarios que pueden hacer que sea ms propenso a sufrir una cada. Informe al mdico si alguno de los medicamentos que toma lo hace sentir mareado o somnoliento.  Pruebas de deteccin para la osteoporosis. La osteoporosis es una afeccin que hace que los huesos se vuelvan ms frgiles. En consecuencia, los huesos pueden debilitarse y quebrarse ms fcilmente.  Pruebas de deteccin para la presin arterial. Los cambios en la presin arterial y los medicamentos para controlar la presin arterial pueden hacerlo sentir mareado.  Controles de fuerza y equilibrio. El mdico puede recomendar ciertos estudios para controlar su fuerza y equilibrio al estar de pie, al caminar o al cambiar de posicin.  Examen de los pies.   El dolor y Chiropractor en los pies, como tambin no utilizar el calzado [place], pueden hacer que sea ms propenso a sufrir Engineer, manufacturing.  Prueba de deteccin de la depresin. Es ms probable que sufra una cada si tiene miedo a caerse, se siente mal emocionalmente o se siente incapaz de Stage manager.  Prueba de deteccin de consumo de alcohol. Beber demasiado alcohol puede afectar su equilibrio y puede hacer que sea ms propenso a sufrir Engineer, manufacturing. Qu medidas puedo tomar para reducir mi riesgo de sufrir una cada? Instrucciones generales  Hable con el mdico sobre sus riesgos de sufrir una cada. Infrmele a su mdico si: ? Se cae. Asegrese de informarle a su mdico acerca de todas las cadas, incluso aquellas que parecen ser [employer] ? Se siente mareado, somnoliento o que pierde el equilibrio.  Tome los medicamentos de venta libre y los recetados solamente como se lo haya indicado el mdico. Estos incluyen todos los suplementos.  Siga una dieta sana y [place] un peso saludable. Una dieta saludable incluye productos lcteos descremados, carnes bajas en contenido de grasa ([place], [place] de granos enteros, frijoles y [place] frutas y verduras. La seguridad en el hogar  Retire los objetos que puedan causar tropiezos tales como alfombras, cables u obstculos.  Instale equipos de seguridad, como barras para sostn en los baos y barandas de seguridad en las escaleras.  Vale habitaciones y los pasillos bien iluminados. Actividad   Siga un programa de ejercicio regular para mantenerse en forma. Esto lo ayudar a Contractor equilibrio. Consulte al mdico qu tipos de ejercicios son adecuados para usted.  Si necesita un bastn o un andador, selo segn las recomendaciones del mdico.  Utilice calzado con buen apoyo y suela antideslizante. Estilo de vida  No beba alcohol si el mdico le indica que no beba.  Si bebe alcohol, limite la cantidad que consume: ? De 0 a 1 medida por da para las mujeres. ? De 0 a 2 medidas por da para los hombres.  Est atento a la cantidad de alcohol que contiene su bebida. En los [place] una medida equivale a una botella tpica de cerveza (12 onzas), media copa de vino (5 onzas) o una medida de bebida blanca (1 onza).  No consuma  ningn producto que contenga nicotina o tabaco, como cigarrillos y Psychologist, sport and exercise. Si necesita ayuda para dejar de fumar, consulte al mdico. Resumen  Tener un estilo de vida saludable y recibir cuidados preventivos pueden ayudar a Theatre stage manager salud y el bienestar despus de los 5 aos de [place].  Realizarse pruebas de deteccin y [employer] es la mejor manera de Hydrographic surveyor un problema de salud de forma temprana y Lourena Simmonds a Product/process development scientist una cada. El diagnstico y tratamiento tempranos le brindan la mejor oportunidad de Chief Technology Officer las afecciones mdicas ms comunes en las personas mayores de 26 aos de edad.  Las cadas son la causa principal de las fracturas de huesos y lesiones en la cabeza de personas mayores de 55 aos de edad. Tome precauciones para evitar una cada en su casa.  Trabaje con el mdico para saber qu cambios que puede hacer para mejorar su salud y [place], y [place]. Esta informacin no tiene Marine scientist el consejo del mdico. Asegrese de hacerle al mdico cualquier pregunta que tenga. Document Released: 01/23/2018 Document Revised: 01/23/2018 Document Reviewed: 01/23/2018 Elsevier Patient Education  2020 [place].  Hypertension, Adult High blood pressure (hypertension) is when the  force of blood pumping through the arteries is too strong. The arteries are the blood vessels that carry blood from the heart throughout the body. Hypertension forces the heart to work harder to pump blood and may cause arteries to become narrow or stiff. Untreated or uncontrolled hypertension can cause a heart attack, heart failure, a stroke, kidney disease, and other problems. A blood pressure reading consists of a higher number over a lower number. Ideally, your blood pressure should be below 120/80. The first ("top") number is called the systolic pressure. It is a measure of the pressure in your arteries as your heart beats. The second ("bottom") number is called the  diastolic pressure. It is a measure of the pressure in your arteries as the heart relaxes. What are the causes? The exact cause of this condition is not known. There are some conditions that result in or are related to high blood pressure. What increases the risk? Some risk factors for high blood pressure are under your control. The following factors may make you more likely to develop this condition:  Smoking.  Having type 2 diabetes mellitus, high cholesterol, or both.  Not getting enough exercise or physical activity.  Being overweight.  Having too much fat, sugar, calories, or salt (sodium) in your diet.  Drinking too much alcohol. Some risk factors for high blood pressure may be difficult or impossible to change. Some of these factors include:  Having chronic kidney disease.  Having a family history of high blood pressure.  Age. Risk increases with age.  Race. You may be at higher risk if you are African American.  Gender. Men are at higher risk than women before age 81. After age 2, women are at higher risk than men.  Having obstructive sleep apnea.  Stress. What are the signs or symptoms? High blood pressure may not cause symptoms. Very high blood pressure (hypertensive crisis) may cause:  Headache.  Anxiety.  Shortness of breath.  Nosebleed.  Nausea and vomiting.  Vision changes.  Severe chest pain.  Seizures. How is this diagnosed? This condition is diagnosed by measuring your blood pressure while you are seated, with your arm resting on a flat surface, your legs uncrossed, and your feet flat on the floor. The cuff of the blood pressure monitor will be placed directly against the skin of your upper arm at the level of your heart. It should be measured at least twice using the same arm. Certain conditions can cause a difference in blood pressure between your right and left arms. Certain factors can cause blood pressure readings to be lower or higher than  normal for a short period of time:  When your blood pressure is higher when you are in a health care provider's office than when you are at home, this is called white coat hypertension. Most people with this condition do not need medicines.  When your blood pressure is higher at home than when you are in a health care provider's office, this is called masked hypertension. Most people with this condition may need medicines to control blood pressure. If you have a high blood pressure reading during one visit or you have normal blood pressure with other risk factors, you may be asked to:  Return on a different day to have your blood pressure checked again.  Monitor your blood pressure at home for 1 week or longer. If you are diagnosed with hypertension, you may have other blood or imaging tests to help your health care provider  understand your overall risk for other conditions. How is this treated? This condition is treated by making healthy lifestyle changes, such as eating healthy foods, exercising more, and reducing your alcohol intake. Your health care provider may prescribe medicine if lifestyle changes are not enough to get your blood pressure under control, and if:  Your systolic blood pressure is above 130.  Your diastolic blood pressure is above 80. Your personal target blood pressure may vary depending on your medical conditions, your age, and other factors. Follow these instructions at home: Eating and drinking   Eat a diet that is high in fiber and potassium, and low in sodium, added sugar, and fat. An example eating plan is called the DASH (Dietary Approaches to Stop Hypertension) diet. To eat this way: ? Eat plenty of fresh fruits and vegetables. Try to fill one half of your plate at each meal with fruits and vegetables. ? Eat whole grains, such as whole-wheat pasta, brown rice, or whole-grain bread. Fill about one fourth of your plate with whole grains. ? Eat or drink low-fat  dairy products, such as skim milk or low-fat yogurt. ? Avoid fatty cuts of meat, processed or cured meats, and poultry with skin. Fill about one fourth of your plate with lean proteins, such as fish, chicken without skin, beans, eggs, or tofu. ? Avoid pre-made and processed foods. These tend to be higher in sodium, added sugar, and fat.  Reduce your daily sodium intake. Most people with hypertension should eat less than 1,500 mg of sodium a day.  Do not drink alcohol if: ? Your health care provider tells you not to drink. ? You are pregnant, may be pregnant, or are planning to become pregnant.  If you drink alcohol: ? Limit how much you use to:  0-1 drink a day for women.  0-2 drinks a day for men. ? Be aware of how much alcohol is in your drink. In the [place] one drink equals one 12 oz bottle of beer (355 mL), one 5 oz glass of wine (148 mL), or one 1 oz glass of hard liquor (44 mL). Lifestyle   Work with your health care provider to maintain a healthy body weight or to lose weight. Ask what an ideal weight is for you.  Get at least 30 minutes of exercise most days of the week. Activities may include walking, swimming, or biking.  Include exercise to strengthen your muscles (resistance exercise), such as Pilates or lifting weights, as part of your weekly exercise routine. Try to do these types of exercises for 30 minutes at least 3 days a week.  Do not use any products that contain nicotine or tobacco, such as cigarettes, e-cigarettes, and chewing tobacco. If you need help quitting, ask your health care provider.  Monitor your blood pressure at home as told by your health care provider.  Keep all follow-up visits as told by your health care provider. This is important. Medicines  Take over-the-counter and prescription medicines only as told by your health care provider. Follow directions carefully. Blood pressure medicines must be taken as prescribed.  Do not skip doses of blood  pressure medicine. Doing this puts you at risk for problems and can make the medicine less effective.  Ask your health care provider about side effects or reactions to medicines that you should watch for. Contact a health care provider if you:  Think you are having a reaction to a medicine you are taking.  Have headaches that keep coming  back (recurring).  Feel dizzy.  Have swelling in your ankles.  Have trouble with your vision. Get help right away if you:  Develop a severe headache or confusion.  Have unusual weakness or numbness.  Feel faint.  Have severe pain in your chest or abdomen.  Vomit repeatedly.  Have trouble breathing. Summary  Hypertension is when the force of blood pumping through your arteries is too strong. If this condition is not controlled, it may put you at risk for serious complications.  Your personal target blood pressure may vary depending on your medical conditions, your age, and other factors. For most people, a normal blood pressure is less than 120/80.  Hypertension is treated with lifestyle changes, medicines, or a combination of both. Lifestyle changes include losing weight, eating a healthy, low-sodium diet, exercising more, and limiting alcohol. This information is not intended to replace advice given to you by your health care provider. Make sure you discuss any questions you have with your health care provider. Document Released: 12/10/2005 Document Revised: 08/20/2018 Document Reviewed: 08/20/2018 Elsevier Patient Education  2020 [employer].

## 2019-12-03 NOTE — Progress Notes (Signed)
BP Readings from Last 3 Encounters:  12/03/19 117/67  05/21/19 133/86  02/04/19 138/86   Emily [name] 63 y.o.   Chief Complaint  Patient presents with   Hypertension    6 months follow up   Medication Refill    PEND    HISTORY OF PRESENT ILLNESS: This is a 63 y.o. female here for follow-up of hypertension and medication refill. Has no complaints or medical concerns today.  HPI   Prior to Admission medications   Medication Sig Start Date End Date Taking? Authorizing Provider  ALPRAZolam Duanne Moron) 0.5 MG tablet TAKE 1 TABLET BY MOUTH EVERY DAY AS NEEDED FOR ANXIETY 10/07/19  Yes Shin Lamour, Ines Bloomer, MD  CALCIUM PO Take 1 tablet by mouth daily.    Yes [provider]  esomeprazole (NEXIUM) 40 MG capsule Take 1 capsule (40 mg total) daily at 12 noon by mouth. 11/01/17  Yes Geral Tuch, Ines Bloomer, MD  metoprolol succinate (TOPROL-XL) 25 MG 24 hr tablet TAKE 1/2 TABLET BY MOUTH  DAILY AT LUNCH OR IN THE  AFTERNOON 09/22/19  Yes Horald Pollen, MD  NIFEdipine (ADALAT CC) 30 MG 24 hr tablet TAKE 1 TABLET(30 MG) BY MOUTH DAILY 06/20/19  Yes Wendie Agreste, MD  meloxicam (MOBIC) 15 MG tablet Take 1 tablet (15 mg total) by mouth daily. Patient not taking: Reported on 12/03/2019 06/30/19   Horald Pollen, MD  rosuvastatin (CRESTOR) 20 MG tablet Take 1 tablet (20 mg total) by mouth daily. 09/03/19 12/02/19  Horald Pollen, MD    Allergies  Allergen Reactions   Ibuprofen    Aspirin Hives   Atorvastatin     Yellow fingernails   Nsaids Hives    Patient Active Problem List   Diagnosis Date Noted   Chronic bilateral low back pain without sciatica 06/30/2019   Carotid artery disease ([place]) 12/19/2018   Bilateral carotid artery stenosis, R- 40-59%, L 1-39% 11/2017 03/25/2018   Dyslipidemia 03/25/2018   GERD (gastroesophageal reflux disease) 04/12/2016   Chronic high back pain 09/24/2015   HTN (hypertension) 10/03/2012   Hyperlipidemia  10/03/2012   DYSPEPSIA&OTHER SPEC DISORDERS FUNCTION STOMACH 07/20/2009    Past Medical History:  Diagnosis Date   Abdominal pain, chronic, epigastric 11/02/2015   Started amitriptyline at hospitalization 04/13/2016 EGD x 2 negative Also w/ chronic chest wall and back pain Would try not to work up further    Anxiety    Arthritis    Back pain    Beta thalassemia minor    diagnosed by hematology Dr. Jennette Kettle, also with sickle cell trait   Chronic high back pain 09/24/2015   Colon polyps    GERD (gastroesophageal reflux disease)    Hyperlipidemia    Hypertension    Sickle cell trait ([place])    diagnosed by hematology Dr. Lindi Adie, coexsisting with beta-thalassemia minor    Past Surgical History:  Procedure Laterality Date   ABDOMINAL HYSTERECTOMY     COLONOSCOPY     ESOPHAGOGASTRODUODENOSCOPY     ESOPHAGOGASTRODUODENOSCOPY [name] 04/13/2016   Procedure: ESOPHAGOGASTRODUODENOSCOPY (EGD);  Surgeon: Gatha Mayer, MD;  Location: [facility];  Service: Endoscopy;  Laterality: [name];    Social History   Socioeconomic History   Marital status: Married    Spouse name: Not on file   Number of children: 3   Years of education: Not on file   Highest education level: Not on file  Occupational History   Not on file  Tobacco Use   Smoking status: Never  Smoker   Smokeless tobacco: Never Used  Substance and Sexual Activity   Alcohol use: No    Alcohol/week: 0.0 standard drinks   Drug use: No   Sexual activity: Yes    Comment: 1st intercourse- 15, partner-1, married- 22 yrs   Other Topics Concern   Not on file  Social History Narrative   Lives with husband and works at [employer] Strain:    Difficulty of Paying Living Expenses: Not on file  Food Insecurity:    Worried About Charity fundraiser in the Last Year: Not on file   [employer] of Food in the Last Year: Not on file  Transportation Needs:    Lack  of Transportation (Medical): Not on file   Lack of Transportation (Non-Medical): Not on file  Physical Activity:    Days of Exercise per Week: Not on file   Minutes of Exercise per Session: Not on file  Stress:    Feeling of Stress : Not on file  Social Connections:    Frequency of Communication with Friends and Family: Not on file   Frequency of Social Gatherings with Friends and Family: Not on file   Attends Religious Services: Not on file   Active Member of Clubs or Organizations: Not on file   Attends Archivist Meetings: Not on file   Marital Status: Not on file  Intimate Partner Violence:    Fear of Current or Ex-Partner: Not on file   Emotionally Abused: Not on file   Physically Abused: Not on file   Sexually Abused: Not on file    Family History  Problem Relation Age of Onset   Aneurysm Mother    Diabetes Father    Colon cancer Brother 12   Liver cancer Brother    Esophageal cancer Neg Hx    Stomach cancer Neg Hx    Pancreatic cancer Neg Hx      Review of Systems  Constitutional: Negative.  Negative for chills and fever.  HENT: Negative.  Negative for congestion and sore throat.   Respiratory: Negative.  Negative for cough and shortness of breath.   Cardiovascular: Negative.  Negative for chest pain and palpitations.  Gastrointestinal: Negative for abdominal pain, diarrhea, nausea and vomiting.  Genitourinary: Negative.   Musculoskeletal: Negative.  Negative for myalgias.  Skin: Negative.  Negative for rash.  Neurological: Negative.  Negative for dizziness and headaches.  All other systems reviewed and are negative.  Vitals:   12/03/19 1651  BP: 117/67  Pulse: 85  Resp: 16  Temp: 98.4 F (36.9 C)  SpO2: 96%     Physical Exam Vitals reviewed.  Constitutional:      Appearance: Normal appearance.  HENT:     Head: Normocephalic.  Eyes:     Extraocular Movements: Extraocular movements intact.     Conjunctiva/sclera:  Conjunctivae normal.     Pupils: Pupils are equal, round, and reactive to light.  Cardiovascular:     Rate and Rhythm: Normal rate and regular rhythm.     Heart sounds: Normal heart sounds.  Pulmonary:     Effort: Pulmonary effort is normal.     Breath sounds: Normal breath sounds.  Musculoskeletal:        General: Normal range of motion.     Cervical back: Normal range of motion and neck supple.  Skin:    General: Skin is warm and dry.     Capillary Refill: Capillary  refill takes less than 2 seconds.  Neurological:     General: No focal deficit present.     Mental Status: She is alert and oriented to person, place, and time.  Psychiatric:        Mood and Affect: Mood normal.        Behavior: Behavior normal.    A total of 25 minutes was spent in the room with the patient, greater than 50% of which was in counseling/coordination of care regarding chronic medical problems, hypertension in particular, treatment, diet and nutrition, review of medications and side effects, need for blood work, prognosis and need for follow-up.   ASSESSMENT & PLAN: Ranata was seen today for hypertension and medication refill.  Diagnoses and all orders for this visit:  Essential hypertension -     NIFEdipine (ADALAT CC) 30 MG 24 hr tablet; TAKE 1 TABLET(30 MG) BY MOUTH DAILY -     metoprolol succinate (TOPROL-XL) 25 MG 24 hr tablet; TAKE 1/2 TABLET BY MOUTH  DAILY AT LUNCH OR IN THE  AFTERNOON -     CBC with Differential -     Comprehensive metabolic panel  Bilateral carotid artery stenosis, R- 40-59%, L 1-39% 11/2017 -     metoprolol succinate (TOPROL-XL) 25 MG 24 hr tablet; TAKE 1/2 TABLET BY MOUTH  DAILY AT LUNCH OR IN THE  AFTERNOON  Dyslipidemia -     rosuvastatin (CRESTOR) 20 MG tablet; Take 1 tablet (20 mg total) by mouth daily. -     Lipid panel   Clinically stable.  No medical concerns identified during this visit.   Patient Instructions       If you have lab work done today you  will be contacted with your lab results within the next 2 weeks.  If you have not heard from [place] then please contact us. The fastest way to get your results is to register for My Chart.   IF you received an x-ray today, you will receive an invoice from [facility]. Please contact [facility] at [phone] with questions or concerns regarding your invoice.   IF you received labwork today, you will receive an invoice from [facility]. Please contact [facility] at [phone] with questions or concerns regarding your invoice.   Our billing staff will not be able to assist you with questions regarding bills from these companies.  You will be contacted with the lab results as soon as they are available. The fastest way to get your results is to activate your My Chart account. Instructions are located on the last page of this paperwork. If you have not heard from [place] regarding the results in 2 weeks, please contact this office.     Mantenimiento de la salud despus de los 49 aos de edad Health Maintenance After Age 18 Despus de los 65 aos de edad, corre un riesgo mayor de Tourist information centre manager enfermedades e infecciones a Barrister's clerk, como tambin de sufrir lesiones por cadas. Las cadas son la causa principal de las fracturas de huesos y lesiones en la cabeza de personas mayores de 52 aos de edad. Recibir cuidados preventivos de forma regular puede ayudarlo a mantenerse saludable y en buen [place]. Los cuidados preventivos incluyen realizarse anlisis de forma regular y Actor en el estilo de vida segn las recomendaciones del mdico. Converse con el profesional que lo asiste sobre:  Las pruebas de deteccin y los anlisis que debe Dispensing optician. Una prueba de deteccin es un estudio que se para Product manager presencia  de una enfermedad cuando no tiene sntomas.  Un plan de dieta y ejercicios adecuado para usted. Qu debo saber sobre las pruebas de deteccin y los anlisis para  prevenir cadas? Realizarse pruebas de deteccin y [employer] es la mejor manera de Hydrographic surveyor un problema de salud de forma temprana. El diagnstico y tratamiento tempranos le brindan la mejor oportunidad de Chief Technology Officer las afecciones mdicas que son comunes despus de los 65 aos de edad. Ciertas afecciones y elecciones de estilo de vida pueden hacer que sea ms propenso a sufrir Engineer, manufacturing. El mdico puede recomendarle lo siguiente:  Controles regulares de la visin. Una visin deficiente y afecciones como las cataratas pueden hacer que sea ms propenso a sufrir Engineer, manufacturing. Si [place] lentes, asegrese de obtener una receta actualizada si su visin cambia.  Revisin de medicamentos. Revise regularmente con el mdico todos los medicamentos que toma, incluidos los medicamentos de [place]. Consulte al [employer] efectos secundarios que pueden hacer que sea ms propenso a sufrir Engineer, manufacturing. Informe al mdico si alguno de los medicamentos que toma lo hace sentir mareado o somnoliento.  Pruebas de deteccin para la osteoporosis. La osteoporosis es una afeccin que hace que los huesos se vuelvan ms frgiles. En consecuencia, los huesos pueden debilitarse y quebrarse ms fcilmente.  Pruebas de deteccin para la presin arterial. Los cambios en la presin arterial y los medicamentos para Chief Technology Officer la presin arterial pueden hacerlo sentir mareado.  Controles de fuerza y equilibrio. El mdico puede recomendar ciertos estudios para controlar su fuerza y equilibrio al estar de pie, al caminar o al cambiar de posicin.  Examen de los pies. El dolor y Chiropractor en los pies, como tambin no utilizar el calzado [place], pueden hacer que sea ms propenso a sufrir Engineer, manufacturing.  Prueba de deteccin de la depresin. Es ms probable que sufra una cada si tiene miedo a caerse, se siente mal emocionalmente o se siente incapaz de Patent examiner.  Prueba de deteccin de consumo de alcohol.  Beber demasiado alcohol puede afectar su equilibrio y puede hacer que sea ms propenso a sufrir Engineer, manufacturing. Qu medidas puedo tomar para reducir mi riesgo de sufrir una cada? Instrucciones generales  Hable con el mdico sobre sus riesgos de sufrir una cada. Infrmele a su mdico si: ? Se cae. Asegrese de informarle a su mdico acerca de todas las cadas, incluso aquellas que parecen ser [employer] ? Se siente mareado, somnoliento o que pierde el equilibrio.  Tome los medicamentos de venta libre y los recetados solamente como se lo haya indicado el mdico. Estos incluyen todos los suplementos.  Siga una dieta sana y [place] un peso saludable. Una dieta saludable incluye productos lcteos descremados, carnes bajas en contenido de grasa ([place], [place] de granos enteros, frijoles y [place] frutas y verduras. La seguridad en el hogar  Retire los objetos que puedan causar tropiezos tales como alfombras, cables u obstculos.  Instale equipos de seguridad, como barras para sostn en los baos y barandas de seguridad en las escaleras.  [place] habitaciones y los pasillos bien iluminados. Actividad   Siga un programa de ejercicio regular para mantenerse en forma. Esto lo ayudar a Contractor equilibrio. Consulte al mdico qu tipos de ejercicios son adecuados para usted.  Si necesita un bastn o un andador, selo segn las recomendaciones del mdico.  Utilice calzado con buen apoyo y suela antideslizante. Estilo de vida  No beba alcohol si el mdico le indica que no  beba.  Si bebe alcohol, limite la cantidad que consume: ? De 0 a 1 medida por da para las mujeres. ? De 0 a 2 medidas por da para los hombres.  Est atento a la cantidad de alcohol que contiene su bebida. En los [place] una medida equivale a una botella tpica de cerveza (12 onzas), media copa de vino (5 onzas) o una medida de bebida blanca (1 onza).  No consuma ningn producto que contenga nicotina o tabaco, como  cigarrillos y Psychologist, sport and exercise. Si necesita ayuda para dejar de fumar, consulte al mdico. Resumen  Tener un estilo de vida saludable y recibir cuidados preventivos pueden ayudar a Theatre stage manager salud y el bienestar despus de los 36 aos de [place].  Realizarse pruebas de deteccin y [employer] es la mejor manera de Hydrographic surveyor un problema de salud de forma temprana y Lourena Simmonds a Product/process development scientist una cada. El diagnstico y tratamiento tempranos le brindan la mejor oportunidad de Chief Technology Officer las afecciones mdicas ms comunes en las personas mayores de 28 aos de edad.  Las cadas son la causa principal de las fracturas de huesos y lesiones en la cabeza de personas mayores de 75 aos de edad. Tome precauciones para evitar una cada en su casa.  Trabaje con el mdico para saber qu cambios que puede hacer para mejorar su salud y [place], y [place]. Esta informacin no tiene Marine scientist el consejo del mdico. Asegrese de hacerle al mdico cualquier pregunta que tenga. Document Released: 01/23/2018 Document Revised: 01/23/2018 Document Reviewed: 01/23/2018 Elsevier Patient Education  2020 [employer].  Hypertension, Adult High blood pressure (hypertension) is when the force of blood pumping through the arteries is too strong. The arteries are the blood vessels that carry blood from the heart throughout the body. Hypertension forces the heart to work harder to pump blood and may cause arteries to become narrow or stiff. Untreated or uncontrolled hypertension can cause a heart attack, heart failure, a stroke, kidney disease, and other problems. A blood pressure reading consists of a higher number over a lower number. Ideally, your blood pressure should be below 120/80. The first ("top") number is called the systolic pressure. It is a measure of the pressure in your arteries as your heart beats. The second ("bottom") number is called the diastolic pressure. It is a measure of the pressure  in your arteries as the heart relaxes. What are the causes? The exact cause of this condition is not known. There are some conditions that result in or are related to high blood pressure. What increases the risk? Some risk factors for high blood pressure are under your control. The following factors may make you more likely to develop this condition:  Smoking.  Having type 2 diabetes mellitus, high cholesterol, or both.  Not getting enough exercise or physical activity.  Being overweight.  Having too much fat, sugar, calories, or salt (sodium) in your diet.  Drinking too much alcohol. Some risk factors for high blood pressure may be difficult or impossible to change. Some of these factors include:  Having chronic kidney disease.  Having a family history of high blood pressure.  Age. Risk increases with age.  Race. You may be at higher risk if you are African American.  Gender. Men are at higher risk than women before age 43. After age 6, women are at higher risk than men.  Having obstructive sleep apnea.  Stress. What are the signs or symptoms? High blood pressure may not cause  symptoms. Very high blood pressure (hypertensive crisis) may cause:  Headache.  Anxiety.  Shortness of breath.  Nosebleed.  Nausea and vomiting.  Vision changes.  Severe chest pain.  Seizures. How is this diagnosed? This condition is diagnosed by measuring your blood pressure while you are seated, with your arm resting on a flat surface, your legs uncrossed, and your feet flat on the floor. The cuff of the blood pressure monitor will be placed directly against the skin of your upper arm at the level of your heart. It should be measured at least twice using the same arm. Certain conditions can cause a difference in blood pressure between your right and left arms. Certain factors can cause blood pressure readings to be lower or higher than normal for a short period of time:  When your blood  pressure is higher when you are in a health care provider's office than when you are at home, this is called white coat hypertension. Most people with this condition do not need medicines.  When your blood pressure is higher at home than when you are in a health care provider's office, this is called masked hypertension. Most people with this condition may need medicines to control blood pressure. If you have a high blood pressure reading during one visit or you have normal blood pressure with other risk factors, you may be asked to:  Return on a different day to have your blood pressure checked again.  Monitor your blood pressure at home for 1 week or longer. If you are diagnosed with hypertension, you may have other blood or imaging tests to help your health care provider understand your overall risk for other conditions. How is this treated? This condition is treated by making healthy lifestyle changes, such as eating healthy foods, exercising more, and reducing your alcohol intake. Your health care provider may prescribe medicine if lifestyle changes are not enough to get your blood pressure under control, and if:  Your systolic blood pressure is above 130.  Your diastolic blood pressure is above 80. Your personal target blood pressure may vary depending on your medical conditions, your age, and other factors. Follow these instructions at home: Eating and drinking   Eat a diet that is high in fiber and potassium, and low in sodium, added sugar, and fat. An example eating plan is called the DASH (Dietary Approaches to Stop Hypertension) diet. To eat this way: ? Eat plenty of fresh fruits and vegetables. Try to fill one half of your plate at each meal with fruits and vegetables. ? Eat whole grains, such as whole-wheat pasta, brown rice, or whole-grain bread. Fill about one fourth of your plate with whole grains. ? Eat or drink low-fat dairy products, such as skim milk or low-fat  yogurt. ? Avoid fatty cuts of meat, processed or cured meats, and poultry with skin. Fill about one fourth of your plate with lean proteins, such as fish, chicken without skin, beans, eggs, or tofu. ? Avoid pre-made and processed foods. These tend to be higher in sodium, added sugar, and fat.  Reduce your daily sodium intake. Most people with hypertension should eat less than 1,500 mg of sodium a day.  Do not drink alcohol if: ? Your health care provider tells you not to drink. ? You are pregnant, may be pregnant, or are planning to become pregnant.  If you drink alcohol: ? Limit how much you use to:  0-1 drink a day for women.  0-2 drinks a day  for men. ? Be aware of how much alcohol is in your drink. In the [place] one drink equals one 12 oz bottle of beer (355 mL), one 5 oz glass of wine (148 mL), or one 1 oz glass of hard liquor (44 mL). Lifestyle   Work with your health care provider to maintain a healthy body weight or to lose weight. Ask what an ideal weight is for you.  Get at least 30 minutes of exercise most days of the week. Activities may include walking, swimming, or biking.  Include exercise to strengthen your muscles (resistance exercise), such as Pilates or lifting weights, as part of your weekly exercise routine. Try to do these types of exercises for 30 minutes at least 3 days a week.  Do not use any products that contain nicotine or tobacco, such as cigarettes, e-cigarettes, and chewing tobacco. If you need help quitting, ask your health care provider.  Monitor your blood pressure at home as told by your health care provider.  Keep all follow-up visits as told by your health care provider. This is important. Medicines  Take over-the-counter and prescription medicines only as told by your health care provider. Follow directions carefully. Blood pressure medicines must be taken as prescribed.  Do not skip doses of blood pressure medicine. Doing this puts you at risk  for problems and can make the medicine less effective.  Ask your health care provider about side effects or reactions to medicines that you should watch for. Contact a health care provider if you:  Think you are having a reaction to a medicine you are taking.  Have headaches that keep coming back (recurring).  Feel dizzy.  Have swelling in your ankles.  Have trouble with your vision. Get help right away if you:  Develop a severe headache or confusion.  Have unusual weakness or numbness.  Feel faint.  Have severe pain in your chest or abdomen.  Vomit repeatedly.  Have trouble breathing. Summary  Hypertension is when the force of blood pumping through your arteries is too strong. If this condition is not controlled, it may put you at risk for serious complications.  Your personal target blood pressure may vary depending on your medical conditions, your age, and other factors. For most people, a normal blood pressure is less than 120/80.  Hypertension is treated with lifestyle changes, medicines, or a combination of both. Lifestyle changes include losing weight, eating a healthy, low-sodium diet, exercising more, and limiting alcohol. This information is not intended to replace advice given to you by your health care provider. Make sure you discuss any questions you have with your health care provider. Document Released: 12/10/2005 Document Revised: 08/20/2018 Document Reviewed: 08/20/2018 Elsevier Patient Education  2020 Elsevier Inc.      Agustina Caroli, MD [facility]

## 2019-12-04 LAB — CBC WITH DIFFERENTIAL/PLATELET
Basophils Absolute: 0.1 10*3/uL (ref 0.0–0.2)
Basos: 1 %
EOS (ABSOLUTE): 0.2 10*3/uL (ref 0.0–0.4)
Eos: 2 %
Hematocrit: 37.6 % (ref 34.0–46.6)
Hemoglobin: 12.5 g/dL (ref 11.1–15.9)
Immature Grans (Abs): 0 10*3/uL (ref 0.0–0.1)
Immature Granulocytes: 0 %
Lymphocytes Absolute: 2.7 10*3/uL (ref 0.7–3.1)
Lymphs: 36 %
MCH: 25.6 pg — ABNORMAL LOW (ref 26.6–33.0)
MCHC: 33.2 g/dL (ref 31.5–35.7)
MCV: 77 fL — ABNORMAL LOW (ref 79–97)
Monocytes Absolute: 0.5 10*3/uL (ref 0.1–0.9)
Monocytes: 7 %
Neutrophils Absolute: 4 10*3/uL (ref 1.4–7.0)
Neutrophils: 54 %
Platelets: 304 10*3/uL (ref 150–450)
RBC: 4.88 x10E6/uL (ref 3.77–5.28)
RDW: 14.2 % (ref 11.7–15.4)
WBC: 7.4 10*3/uL (ref 3.4–10.8)

## 2019-12-04 LAB — COMPREHENSIVE METABOLIC PANEL
ALT: 18 IU/L (ref 0–32)
AST: 22 IU/L (ref 0–40)
Albumin/Globulin Ratio: 1.4 (ref 1.2–2.2)
Albumin: 4.4 g/dL (ref 3.8–4.8)
Alkaline Phosphatase: 154 IU/L — ABNORMAL HIGH (ref 39–117)
BUN/Creatinine Ratio: 13 (ref 12–28)
BUN: 9 mg/dL (ref 8–27)
Bilirubin Total: 0.2 mg/dL (ref 0.0–1.2)
CO2: 24 mmol/L (ref 20–29)
Calcium: 10.5 mg/dL — ABNORMAL HIGH (ref 8.7–10.3)
Chloride: 101 mmol/L (ref 96–106)
Creatinine, Ser: 0.69 mg/dL (ref 0.57–1.00)
GFR calc Af Amer: 108 mL/min/{1.73_m2} (ref >59)
GFR calc non Af Amer: 94 mL/min/{1.73_m2} (ref >59)
Globulin, Total: 3.2 g/dL (ref 1.5–4.5)
Glucose: 91 mg/dL (ref 65–99)
Potassium: 4.1 mmol/L (ref 3.5–5.2)
Sodium: 139 mmol/L (ref 134–144)
Total Protein: 7.6 g/dL (ref 6.0–8.5)

## 2019-12-04 LAB — LIPID PANEL
Chol/HDL Ratio: 2.9 ratio (ref 0.0–4.4)
Cholesterol, Total: 173 mg/dL (ref 100–199)
HDL: 59 mg/dL (ref >39)
LDL Chol Calc (NIH): 93 mg/dL (ref 0–99)
Triglycerides: 116 mg/dL (ref 0–149)
VLDL Cholesterol Cal: 21 mg/dL (ref 5–40)

## 2020-01-18 ENCOUNTER — Encounter: Payer: Commercial Managed Care - PPO | Admitting: Gastroenterology

## 2020-02-23 IMAGING — DX DG CHEST 2V
2 series · 2 of 2 positions shown · non-contrast
Comparison: Chest CT 01/20/2018 and chest radiograph 01/12/2018

CLINICAL DATA: Shortness of breath.

EXAM:
CHEST - 2 VIEW

[chest pa]
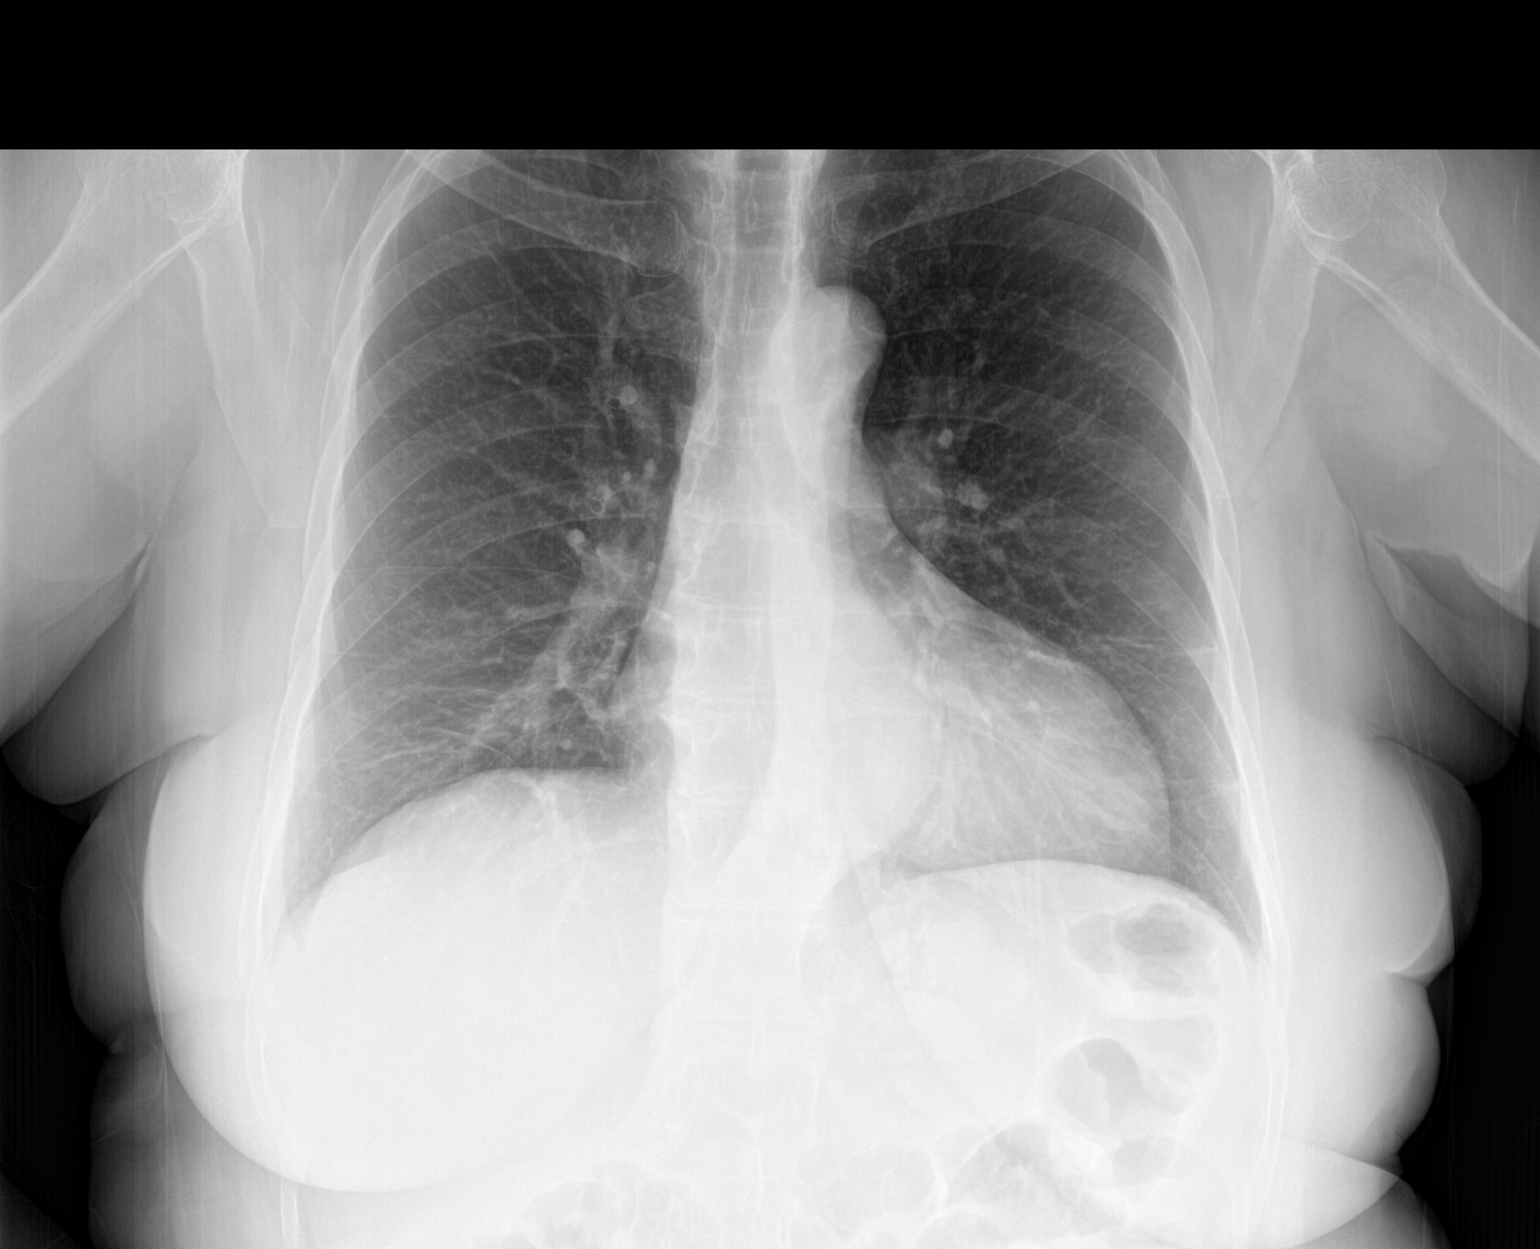

[chest lat]
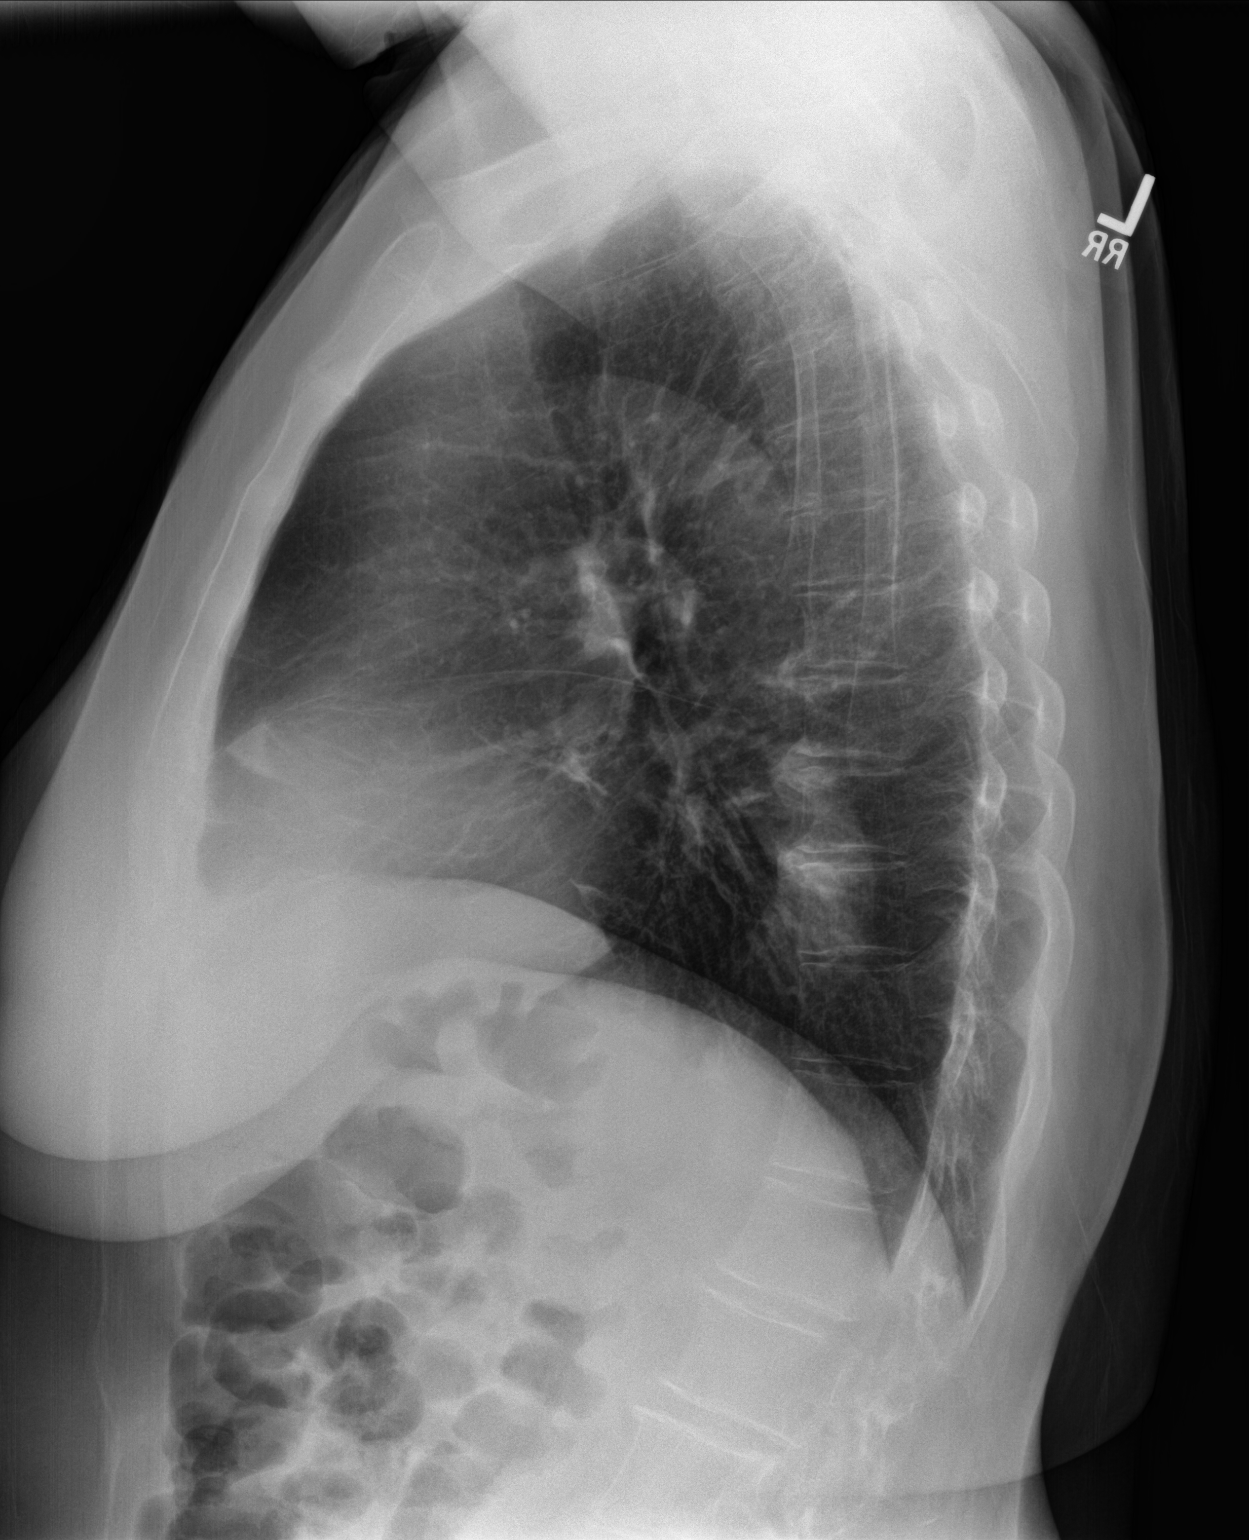

[2 of 2 positions shown; findings below may reference images not displayed]

FINDINGS: Multiple linear densities at the lung bases are suggestive for
atelectasis with some underlying scarring. Small focus of density
along the anterior chest could represent focal atelectasis or
overlying shadows. Otherwise, the lungs are clear. No pleural
effusions. Heart and mediastinum are within normal limits.
Degenerative changes in lower thoracic spine.
IMPRESSION: Atelectasis and scarring at the lung bases.

## 2020-03-03 ENCOUNTER — Encounter: Payer: Self-pay | Admitting: Emergency Medicine

## 2020-03-03 ENCOUNTER — Other Ambulatory Visit: Payer: Self-pay

## 2020-03-03 ENCOUNTER — Ambulatory Visit (INDEPENDENT_AMBULATORY_CARE_PROVIDER_SITE_OTHER): Payer: Managed Care, Other (non HMO) | Admitting: Emergency Medicine

## 2020-03-03 VITALS — BP 132/83 | HR 97 | Temp 98.1°F | Resp 16 | Ht 66.0 in | Wt 206.0 lb

## 2020-03-03 DIAGNOSIS — E785 Hyperlipidemia, unspecified: Secondary | ICD-10-CM | POA: Diagnosis not present

## 2020-03-03 DIAGNOSIS — I10 Essential (primary) hypertension: Secondary | ICD-10-CM | POA: Diagnosis not present

## 2020-03-03 DIAGNOSIS — F419 Anxiety disorder, unspecified: Secondary | ICD-10-CM

## 2020-03-03 DIAGNOSIS — F411 Generalized anxiety disorder: Secondary | ICD-10-CM | POA: Diagnosis not present

## 2020-03-03 DIAGNOSIS — F418 Other specified anxiety disorders: Secondary | ICD-10-CM

## 2020-03-03 DIAGNOSIS — I6523 Occlusion and stenosis of bilateral carotid arteries: Secondary | ICD-10-CM

## 2020-03-03 MED ORDER — ESCITALOPRAM OXALATE 10 MG PO TABS: 10.0000 mg | ORAL_TABLET | Freq: Every day | ORAL | 1 refills | Status: DC

## 2020-03-03 MED ORDER — ROSUVASTATIN CALCIUM 20 MG PO TABS: 20.0000 mg | ORAL_TABLET | Freq: Every day | ORAL | 3 refills | Status: DC

## 2020-03-03 MED ORDER — ALPRAZOLAM 0.5 MG PO TABS: ORAL_TABLET | ORAL | 0 refills | Status: DC

## 2020-03-03 MED ORDER — METOPROLOL SUCCINATE ER 25 MG PO TB24: 12.5000 mg | ORAL_TABLET | Freq: Every day | ORAL | 3 refills | Status: DC

## 2020-03-03 MED ORDER — NIFEDIPINE ER 30 MG PO TB24: ORAL_TABLET | ORAL | 3 refills | Status: DC

## 2020-03-03 NOTE — Progress Notes (Signed)
BP Readings from Last 3 Encounters:  03/03/20 132/83  12/03/19 117/67  05/21/19 133/86   Emily [name] [facility] 64 y.o.   Chief Complaint  Patient presents with  . Medication Refill    xanax, metoprolol, nifedipine, and crestor    HISTORY OF PRESENT ILLNESS: This is a 64 y.o. female with history of hypertension and chronic anxiety here for follow-up and medication refill.  Also requesting a work note. No other complaints or medical concerns today.  HPI   Prior to Admission medications   Medication Sig Start Date End Date Taking? Authorizing Provider  ALPRAZolam Duanne Moron) 0.5 MG tablet TAKE 1 TABLET BY MOUTH EVERY DAY AS NEEDED FOR ANXIETY 10/07/19  Yes Erle Guster, Ines Bloomer, MD  CALCIUM PO Take 1 tablet by mouth daily.    Yes [provider]  metoprolol succinate (TOPROL-XL) 25 MG 24 hr tablet TAKE 1/2 TABLET BY MOUTH  DAILY AT LUNCH OR IN THE  AFTERNOON 12/03/19  Yes Jesly Hartmann, Ines Bloomer, MD  NIFEdipine (ADALAT CC) 30 MG 24 hr tablet TAKE 1 TABLET(30 MG) BY MOUTH DAILY 12/03/19  Yes [employer] Null, Ines Bloomer, MD  esomeprazole (NEXIUM) 40 MG capsule Take 1 capsule (40 mg total) daily at 12 noon by mouth. Patient not taking: Reported on 03/03/2020 11/01/17   Horald Pollen, MD  meloxicam (MOBIC) 15 MG tablet Take 1 tablet (15 mg total) by mouth daily. Patient not taking: Reported on 12/03/2019 06/30/19   Horald Pollen, MD  rosuvastatin (CRESTOR) 20 MG tablet Take 1 tablet (20 mg total) by mouth daily. 12/03/19 03/02/20  Horald Pollen, MD    Allergies  Allergen Reactions  . Ibuprofen   . Aspirin Hives  . Atorvastatin     Yellow fingernails  . Nsaids Hives    Patient Active Problem List   Diagnosis Date Noted  . Chronic bilateral low back pain without sciatica 06/30/2019  . Carotid artery disease ([place]) 12/19/2018  . Bilateral carotid artery stenosis, R- 40-59%, L 1-39% 11/2017 03/25/2018  . Dyslipidemia 03/25/2018  . GERD (gastroesophageal reflux  disease) 04/12/2016  . Chronic high back pain 09/24/2015  . HTN (hypertension) 10/03/2012  . Hyperlipidemia 10/03/2012  . DYSPEPSIA&OTHER [facility] DISORDERS FUNCTION STOMACH 07/20/2009    Past Medical History:  Diagnosis Date  . Abdominal pain, chronic, epigastric 11/02/2015   Started amitriptyline at hospitalization 04/13/2016 EGD x 2 negative Also w/ chronic chest wall and back pain Would try not to work up further   . Anxiety   . Arthritis   . Back pain   . Beta thalassemia minor    diagnosed by hematology Dr. Jennette Kettle, also with sickle cell trait  . Chronic high back pain 09/24/2015  . Colon polyps   . GERD (gastroesophageal reflux disease)   . Hyperlipidemia   . Hypertension   . Sickle cell trait ([place])    diagnosed by hematology Dr. Lindi Adie, coexsisting with beta-thalassemia minor    Past Surgical History:  Procedure Laterality Date  . ABDOMINAL HYSTERECTOMY    . COLONOSCOPY    . ESOPHAGOGASTRODUODENOSCOPY    . ESOPHAGOGASTRODUODENOSCOPY [name] 04/13/2016   Procedure: ESOPHAGOGASTRODUODENOSCOPY (EGD);  Surgeon: Gatha Mayer, MD;  Location: [facility];  Service: Endoscopy;  Laterality: [name];    Social History   Socioeconomic History  . Marital status: Married    Spouse name: Not on file  . Number of children: 3  . Years of education: Not on file  . Highest education level: Not on file  Occupational History  .  Not on file  Tobacco Use  . Smoking status: Never Smoker  . Smokeless tobacco: Never Used  Substance and Sexual Activity  . Alcohol use: No    Alcohol/week: 0.0 standard drinks  . Drug use: No  . Sexual activity: Yes    Comment: 1st intercourse- 34, partner-1, married- 39 yrs   Other Topics Concern  . Not on file  Social History Narrative   Lives with husband and works at [employer] Strain:   . Difficulty of Paying Living Expenses:   Food Insecurity:   . Worried About Charity fundraiser in the Last  Year:   . Arboriculturist in the Last Year:   Transportation Needs:   . Film/video editor (Medical):   Marland Kitchen Lack of Transportation (Non-Medical):   Physical Activity:   . Days of Exercise per Week:   . Minutes of Exercise per Session:   Stress:   . Feeling of Stress :   Social Connections:   . Frequency of Communication with Friends and Family:   . Frequency of Social Gatherings with Friends and Family:   . Attends Religious Services:   . Active Member of Clubs or Organizations:   . Attends Archivist Meetings:   Marland Kitchen Marital Status:   Intimate Partner Violence:   . Fear of Current or Ex-Partner:   . Emotionally Abused:   Marland Kitchen Physically Abused:   . Sexually Abused:     Family History  Problem Relation Age of Onset  . Aneurysm Mother   . Diabetes Father   . Colon cancer Brother 26  . Liver cancer Brother   . Esophageal cancer Neg Hx   . Stomach cancer Neg Hx   . Pancreatic cancer Neg Hx      Review of Systems  Constitutional: Negative.  Negative for chills and fever.  HENT: Negative.  Negative for congestion and sore throat.   Eyes: Negative.  Negative for blurred vision and double vision.  Respiratory: Negative for cough and shortness of breath.   Cardiovascular: Negative.  Negative for chest pain and palpitations.  Gastrointestinal: Negative for abdominal pain, blood in stool, diarrhea, melena, nausea and vomiting.  Genitourinary: Negative.  Negative for dysuria and hematuria.  Musculoskeletal: Negative.  Negative for myalgias.  Skin: Negative.  Negative for rash.  Neurological: Negative.  Negative for dizziness, sensory change and headaches.  Psychiatric/Behavioral: The patient is nervous/anxious.    Today's Vitals   03/03/20 1554  BP: 132/83  Pulse: 97  Resp: 16  Temp: 98.1 F (36.7 C)  TempSrc: Temporal  SpO2: 97%  Weight: 206 lb (93.4 kg)  Height: 5\' 6"  (1.676 m)   Body mass index is 33.25 kg/m.   Physical Exam Vitals reviewed.   Constitutional:      Appearance: Normal appearance.  HENT:     Head: Normocephalic.  Eyes:     Extraocular Movements: Extraocular movements intact.     Conjunctiva/sclera: Conjunctivae normal.     Pupils: Pupils are equal, round, and reactive to light.  Cardiovascular:     Rate and Rhythm: Normal rate and regular rhythm.     Pulses: Normal pulses.     Heart sounds: Normal heart sounds.  Pulmonary:     Effort: Pulmonary effort is normal.     Breath sounds: Normal breath sounds.  Musculoskeletal:        General: Normal range of motion.     Cervical back: Normal  range of motion and neck supple.  Skin:    General: Skin is warm and dry.     Capillary Refill: Capillary refill takes less than 2 seconds.  Neurological:     General: No focal deficit present.     Mental Status: She is alert and oriented to person, place, and time.  Psychiatric:        Mood and Affect: Mood normal.        Behavior: Behavior normal.     A total of 30 minutes was spent with the patient, greater than 50% of which was in counseling/coordination of care regarding chronic medical problems, review of most recent office visit notes, review of most recent blood work results, review of all medications, review of new medication and side effects, need to follow-up with psychiatry for generalized anxiety disorder, diet and nutrition, discussion related to work note, prognosis and need for follow-up.  ASSESSMENT & PLAN: Rhae was seen today for medication refill.  Diagnoses and all orders for this visit:  Essential hypertension -     NIFEdipine (ADALAT CC) 30 MG 24 hr tablet; TAKE 1 TABLET(30 MG) BY MOUTH DAILY -     metoprolol succinate (TOPROL-XL) 25 MG 24 hr tablet; Take 0.5 tablets (12.5 mg total) by mouth daily. TAKE 1/2 TABLET BY MOUTH  DAILY AT LUNCH OR IN THE  AFTERNOON  Chronic anxiety -     Ambulatory referral to Psychiatry  Generalized anxiety disorder -     escitalopram (LEXAPRO) 10 MG tablet; Take 1  tablet (10 mg total) by mouth daily. -     Ambulatory referral to Psychiatry  Dyslipidemia -     rosuvastatin (CRESTOR) 20 MG tablet; Take 1 tablet (20 mg total) by mouth daily.  Bilateral carotid artery stenosis -     metoprolol succinate (TOPROL-XL) 25 MG 24 hr tablet; Take 0.5 tablets (12.5 mg total) by mouth daily. TAKE 1/2 TABLET BY MOUTH  DAILY AT LUNCH OR IN THE  AFTERNOON  Bilateral carotid artery stenosis, R- 40-59%, L 1-39% 11/2017 -     metoprolol succinate (TOPROL-XL) 25 MG 24 hr tablet; Take 0.5 tablets (12.5 mg total) by mouth daily. TAKE 1/2 TABLET BY MOUTH  DAILY AT LUNCH OR IN THE  AFTERNOON  Situational anxiety -     ALPRAZolam (XANAX) 0.5 MG tablet; TAKE 1 TABLET BY MOUTH EVERY DAY AS NEEDED FOR ANXIETY    Patient Instructions       If you have lab work done today you will be contacted with your lab results within the next 2 weeks.  If you have not heard from [place] then please contact us. The fastest way to get your results is to register for My Chart.   IF you received an x-ray today, you will receive an invoice from [facility]. Please contact [facility] at [phone] with questions or concerns regarding your invoice.   IF you received labwork today, you will receive an invoice from [facility]. Please contact [facility] at [phone] with questions or concerns regarding your invoice.   Our billing staff will not be able to assist you with questions regarding bills from these companies.  You will be contacted with the lab results as soon as they are available. The fastest way to get your results is to activate your My Chart account. Instructions are located on the last page of this paperwork. If you have not heard from [place] regarding the results in 2 weeks, please contact this office.  Hypertension, Adult High blood pressure (hypertension) is when the force of blood pumping through the arteries is too strong. The arteries are the blood  vessels that carry blood from the heart throughout the body. Hypertension forces the heart to work harder to pump blood and may cause arteries to become narrow or stiff. Untreated or uncontrolled hypertension can cause a heart attack, heart failure, a stroke, kidney disease, and other problems. A blood pressure reading consists of a higher number over a lower number. Ideally, your blood pressure should be below 120/80. The first ("top") number is called the systolic pressure. It is a measure of the pressure in your arteries as your heart beats. The second ("bottom") number is called the diastolic pressure. It is a measure of the pressure in your arteries as the heart relaxes. What are the causes? The exact cause of this condition is not known. There are some conditions that result in or are related to high blood pressure. What increases the risk? Some risk factors for high blood pressure are under your control. The following factors may make you more likely to develop this condition:  Smoking.  Having type 2 diabetes mellitus, high cholesterol, or both.  Not getting enough exercise or physical activity.  Being overweight.  Having too much fat, sugar, calories, or salt (sodium) in your diet.  Drinking too much alcohol. Some risk factors for high blood pressure may be difficult or impossible to change. Some of these factors include:  Having chronic kidney disease.  Having a family history of high blood pressure.  Age. Risk increases with age.  Race. You may be at higher risk if you are African American.  Gender. Men are at higher risk than women before age 73. After age 76, women are at higher risk than men.  Having obstructive sleep apnea.  Stress. What are the signs or symptoms? High blood pressure may not cause symptoms. Very high blood pressure (hypertensive crisis) may cause:  Headache.  Anxiety.  Shortness of breath.  Nosebleed.  Nausea and vomiting.  Vision changes.   Severe chest pain.  Seizures. How is this diagnosed? This condition is diagnosed by measuring your blood pressure while you are seated, with your arm resting on a flat surface, your legs uncrossed, and your feet flat on the floor. The cuff of the blood pressure monitor will be placed directly against the skin of your upper arm at the level of your heart. It should be measured at least twice using the same arm. Certain conditions can cause a difference in blood pressure between your right and left arms. Certain factors can cause blood pressure readings to be lower or higher than normal for a short period of time:  When your blood pressure is higher when you are in a health care provider's office than when you are at home, this is called white coat hypertension. Most people with this condition do not need medicines.  When your blood pressure is higher at home than when you are in a health care provider's office, this is called masked hypertension. Most people with this condition may need medicines to control blood pressure. If you have a high blood pressure reading during one visit or you have normal blood pressure with other risk factors, you may be asked to:  Return on a different day to have your blood pressure checked again.  Monitor your blood pressure at home for 1 week or longer. If you are diagnosed with hypertension, you may have other blood  or imaging tests to help your health care provider understand your overall risk for other conditions. How is this treated? This condition is treated by making healthy lifestyle changes, such as eating healthy foods, exercising more, and reducing your alcohol intake. Your health care provider may prescribe medicine if lifestyle changes are not enough to get your blood pressure under control, and if:  Your systolic blood pressure is above 130.  Your diastolic blood pressure is above 80. Your personal target blood pressure may vary depending on your  medical conditions, your age, and other factors. Follow these instructions at home: Eating and drinking   Eat a diet that is high in fiber and potassium, and low in sodium, added sugar, and fat. An example eating plan is called the DASH (Dietary Approaches to Stop Hypertension) diet. To eat this way: ? Eat plenty of fresh fruits and vegetables. Try to fill one half of your plate at each meal with fruits and vegetables. ? Eat whole grains, such as whole-wheat pasta, brown rice, or whole-grain bread. Fill about one fourth of your plate with whole grains. ? Eat or drink low-fat dairy products, such as skim milk or low-fat yogurt. ? Avoid fatty cuts of meat, processed or cured meats, and poultry with skin. Fill about one fourth of your plate with lean proteins, such as fish, chicken without skin, beans, eggs, or tofu. ? Avoid pre-made and processed foods. These tend to be higher in sodium, added sugar, and fat.  Reduce your daily sodium intake. Most people with hypertension should eat less than 1,500 mg of sodium a day.  Do not drink alcohol if: ? Your health care provider tells you not to drink. ? You are pregnant, may be pregnant, or are planning to become pregnant.  If you drink alcohol: ? Limit how much you use to:  0-1 drink a day for women.  0-2 drinks a day for men. ? Be aware of how much alcohol is in your drink. In the [place] one drink equals one 12 oz bottle of beer (355 mL), one 5 oz glass of wine (148 mL), or one 1 oz glass of hard liquor (44 mL). Lifestyle   Work with your health care provider to maintain a healthy body weight or to lose weight. Ask what an ideal weight is for you.  Get at least 30 minutes of exercise most days of the week. Activities may include walking, swimming, or biking.  Include exercise to strengthen your muscles (resistance exercise), such as Pilates or lifting weights, as part of your weekly exercise routine. Try to do these types of exercises for 30  minutes at least 3 days a week.  Do not use any products that contain nicotine or tobacco, such as cigarettes, e-cigarettes, and chewing tobacco. If you need help quitting, ask your health care provider.  Monitor your blood pressure at home as told by your health care provider.  Keep all follow-up visits as told by your health care provider. This is important. Medicines  Take over-the-counter and prescription medicines only as told by your health care provider. Follow directions carefully. Blood pressure medicines must be taken as prescribed.  Do not skip doses of blood pressure medicine. Doing this puts you at risk for problems and can make the medicine less effective.  Ask your health care provider about side effects or reactions to medicines that you should watch for. Contact a health care provider if you:  Think you are having a reaction to a medicine  you are taking.  Have headaches that keep coming back (recurring).  Feel dizzy.  Have swelling in your ankles.  Have trouble with your vision. Get help right away if you:  Develop a severe headache or confusion.  Have unusual weakness or numbness.  Feel faint.  Have severe pain in your chest or abdomen.  Vomit repeatedly.  Have trouble breathing. Summary  Hypertension is when the force of blood pumping through your arteries is too strong. If this condition is not controlled, it may put you at risk for serious complications.  Your personal target blood pressure may vary depending on your medical conditions, your age, and other factors. For most people, a normal blood pressure is less than 120/80.  Hypertension is treated with lifestyle changes, medicines, or a combination of both. Lifestyle changes include losing weight, eating a healthy, low-sodium diet, exercising more, and limiting alcohol. This information is not intended to replace advice given to you by your health care provider. Make sure you discuss any questions  you have with your health care provider. Document Revised: 08/20/2018 Document Reviewed: 08/20/2018 Elsevier Patient Education  2020 [employer]      Agustina Caroli, MD [facility]

## 2020-03-03 NOTE — Patient Instructions (Addendum)
   If you have lab work done today you will be contacted with your lab results within the next 2 weeks.  If you have not heard from us then please contact us. The fastest way to get your results is to register for My Chart.   IF you received an x-ray today, you will receive an invoice from [facility]. Please contact [facility] at [phone] with questions or concerns regarding your invoice.   IF you received labwork today, you will receive an invoice from [facility]. Please contact [facility] at [phone] with questions or concerns regarding your invoice.   Our billing staff will not be able to assist you with questions regarding bills from these companies.  You will be contacted with the lab results as soon as they are available. The fastest way to get your results is to activate your My Chart account. Instructions are located on the last page of this paperwork. If you have not heard from us regarding the results in 2 weeks, please contact this office.      Hypertension, Adult High blood pressure (hypertension) is when the force of blood pumping through the arteries is too strong. The arteries are the blood vessels that carry blood from the heart throughout the body. Hypertension forces the heart to work harder to pump blood and may cause arteries to become narrow or stiff. Untreated or uncontrolled hypertension can cause a heart attack, heart failure, a stroke, kidney disease, and other problems. A blood pressure reading consists of a higher number over a lower number. Ideally, your blood pressure should be below 120/80. The first ("top") number is called the systolic pressure. It is a measure of the pressure in your arteries as your heart beats. The second ("bottom") number is called the diastolic pressure. It is a measure of the pressure in your arteries as the heart relaxes. What are the causes? The exact cause of this condition is not known. There are some conditions  that result in or are related to high blood pressure. What increases the risk? Some risk factors for high blood pressure are under your control. The following factors may make you more likely to develop this condition:  Smoking.  Having type 2 diabetes mellitus, high cholesterol, or both.  Not getting enough exercise or physical activity.  Being overweight.  Having too much fat, sugar, calories, or salt (sodium) in your diet.  Drinking too much alcohol. Some risk factors for high blood pressure may be difficult or impossible to change. Some of these factors include:  Having chronic kidney disease.  Having a family history of high blood pressure.  Age. Risk increases with age.  Race. You may be at higher risk if you are African American.  Gender. Men are at higher risk than women before age 45. After age 65, women are at higher risk than men.  Having obstructive sleep apnea.  Stress. What are the signs or symptoms? High blood pressure may not cause symptoms. Very high blood pressure (hypertensive crisis) may cause:  Headache.  Anxiety.  Shortness of breath.  Nosebleed.  Nausea and vomiting.  Vision changes.  Severe chest pain.  Seizures. How is this diagnosed? This condition is diagnosed by measuring your blood pressure while you are seated, with your arm resting on a flat surface, your legs uncrossed, and your feet flat on the floor. The cuff of the blood pressure monitor will be placed directly against the skin of your upper arm at the level of your   heart. It should be measured at least twice using the same arm. Certain conditions can cause a difference in blood pressure between your right and left arms. Certain factors can cause blood pressure readings to be lower or higher than normal for a short period of time:  When your blood pressure is higher when you are in a health care provider's office than when you are at home, this is called white coat hypertension.  Most people with this condition do not need medicines.  When your blood pressure is higher at home than when you are in a health care provider's office, this is called masked hypertension. Most people with this condition may need medicines to control blood pressure. If you have a high blood pressure reading during one visit or you have normal blood pressure with other risk factors, you may be asked to:  Return on a different day to have your blood pressure checked again.  Monitor your blood pressure at home for 1 week or longer. If you are diagnosed with hypertension, you may have other blood or imaging tests to help your health care provider understand your overall risk for other conditions. How is this treated? This condition is treated by making healthy lifestyle changes, such as eating healthy foods, exercising more, and reducing your alcohol intake. Your health care provider may prescribe medicine if lifestyle changes are not enough to get your blood pressure under control, and if:  Your systolic blood pressure is above 130.  Your diastolic blood pressure is above 80. Your personal target blood pressure may vary depending on your medical conditions, your age, and other factors. Follow these instructions at home: Eating and drinking   Eat a diet that is high in fiber and potassium, and low in sodium, added sugar, and fat. An example eating plan is called the DASH (Dietary Approaches to Stop Hypertension) diet. To eat this way: ? Eat plenty of fresh fruits and vegetables. Try to fill one half of your plate at each meal with fruits and vegetables. ? Eat whole grains, such as whole-wheat pasta, brown rice, or whole-grain bread. Fill about one fourth of your plate with whole grains. ? Eat or drink low-fat dairy products, such as skim milk or low-fat yogurt. ? Avoid fatty cuts of meat, processed or cured meats, and poultry with skin. Fill about one fourth of your plate with lean proteins, such  as fish, chicken without skin, beans, eggs, or tofu. ? Avoid pre-made and processed foods. These tend to be higher in sodium, added sugar, and fat.  Reduce your daily sodium intake. Most people with hypertension should eat less than 1,500 mg of sodium a day.  Do not drink alcohol if: ? Your health care provider tells you not to drink. ? You are pregnant, may be pregnant, or are planning to become pregnant.  If you drink alcohol: ? Limit how much you use to:  0-1 drink a day for women.  0-2 drinks a day for men. ? Be aware of how much alcohol is in your drink. In the [place] one drink equals one 12 oz bottle of beer (355 mL), one 5 oz glass of wine (148 mL), or one 1 oz glass of hard liquor (44 mL). Lifestyle   Work with your health care provider to maintain a healthy body weight or to lose weight. Ask what an ideal weight is for you.  Get at least 30 minutes of exercise most days of the week. Activities may include walking, swimming,   or biking.  Include exercise to strengthen your muscles (resistance exercise), such as Pilates or lifting weights, as part of your weekly exercise routine. Try to do these types of exercises for 30 minutes at least 3 days a week.  Do not use any products that contain nicotine or tobacco, such as cigarettes, e-cigarettes, and chewing tobacco. If you need help quitting, ask your health care provider.  Monitor your blood pressure at home as told by your health care provider.  Keep all follow-up visits as told by your health care provider. This is important. Medicines  Take over-the-counter and prescription medicines only as told by your health care provider. Follow directions carefully. Blood pressure medicines must be taken as prescribed.  Do not skip doses of blood pressure medicine. Doing this puts you at risk for problems and can make the medicine less effective.  Ask your health care provider about side effects or reactions to medicines that you  should watch for. Contact a health care provider if you:  Think you are having a reaction to a medicine you are taking.  Have headaches that keep coming back (recurring).  Feel dizzy.  Have swelling in your ankles.  Have trouble with your vision. Get help right away if you:  Develop a severe headache or confusion.  Have unusual weakness or numbness.  Feel faint.  Have severe pain in your chest or abdomen.  Vomit repeatedly.  Have trouble breathing. Summary  Hypertension is when the force of blood pumping through your arteries is too strong. If this condition is not controlled, it may put you at risk for serious complications.  Your personal target blood pressure may vary depending on your medical conditions, your age, and other factors. For most people, a normal blood pressure is less than 120/80.  Hypertension is treated with lifestyle changes, medicines, or a combination of both. Lifestyle changes include losing weight, eating a healthy, low-sodium diet, exercising more, and limiting alcohol. This information is not intended to replace advice given to you by your health care provider. Make sure you discuss any questions you have with your health care provider. Document Revised: 08/20/2018 Document Reviewed: 08/20/2018 Elsevier Patient Education  2020 Elsevier Inc.  

## 2020-03-23 ENCOUNTER — Ambulatory Visit: Payer: Managed Care, Other (non HMO) | Admitting: Adult Health Nurse Practitioner

## 2020-03-23 ENCOUNTER — Other Ambulatory Visit: Payer: Self-pay

## 2020-03-23 VITALS — BP 130/78 | HR 71 | Temp 97.8°F | Ht 66.0 in | Wt 205.6 lb

## 2020-03-23 DIAGNOSIS — R079 Chest pain, unspecified: Secondary | ICD-10-CM

## 2020-03-23 DIAGNOSIS — E785 Hyperlipidemia, unspecified: Secondary | ICD-10-CM

## 2020-03-23 DIAGNOSIS — F419 Anxiety disorder, unspecified: Secondary | ICD-10-CM

## 2020-03-23 DIAGNOSIS — I6523 Occlusion and stenosis of bilateral carotid arteries: Secondary | ICD-10-CM

## 2020-03-23 DIAGNOSIS — I10 Essential (primary) hypertension: Secondary | ICD-10-CM

## 2020-03-23 MED ORDER — OMEPRAZOLE 20 MG PO CPDR: 20.0000 mg | DELAYED_RELEASE_CAPSULE | Freq: Every day | ORAL | 3 refills | Status: DC

## 2020-03-23 NOTE — Progress Notes (Unsigned)
Subjective:    Emily [name] is a 64 y.o. female complaining of moderate chest pain. Onset was sudden, and began 3 days ago, and lasting to the present time. She describes her pain as pressure-like, and does not radiate. Pain is triggered by: {triggers:2002}. She has {therapy:2013}. She is {consciousness:2018}. SOB: {shortness of breath:2004}. Other symptoms: {symptoms:2005}. Other history: {history:2008}.   Assessment:    This {is/is not:9024}cardiac pain. Etiology: {etiology:2010} STAT symptoms include: {symptoms:2011}.     Plan:    {plan:2009}

## 2020-03-23 NOTE — Patient Instructions (Signed)
° ° ° °  If you have lab work done today you will be contacted with your lab results within the next 2 weeks.  If you have not heard from us then please contact us. The fastest way to get your results is to register for My Chart. ° ° °IF you received an x-ray today, you will receive an invoice from [facility]. Please contact [facility] at [phone] with questions or concerns regarding your invoice.  ° °IF you received labwork today, you will receive an invoice from [facility]. Please contact [facility] at [phone] with questions or concerns regarding your invoice.  ° °Our billing staff will not be able to assist you with questions regarding bills from these companies. ° °You will be contacted with the lab results as soon as they are available. The fastest way to get your results is to activate your My Chart account. Instructions are located on the last page of this paperwork. If you have not heard from us regarding the results in 2 weeks, please contact this office. °  ° ° ° °

## 2020-03-24 ENCOUNTER — Ambulatory Visit: Payer: Managed Care, Other (non HMO) | Admitting: Family Medicine

## 2020-03-24 ENCOUNTER — Telehealth: Payer: Self-pay | Admitting: Emergency Medicine

## 2020-03-24 NOTE — Telephone Encounter (Signed)
Can the provider please sign the 3/31 visit so I can send the  URGENT referral  Thank you

## 2020-03-30 NOTE — Telephone Encounter (Signed)
Can the provider please sign the 3/31 visit so the  URGENT referral can be processed. Thank you

## 2020-04-04 ENCOUNTER — Telehealth: Payer: Self-pay | Admitting: Emergency Medicine

## 2020-04-04 ENCOUNTER — Other Ambulatory Visit: Payer: Self-pay

## 2020-04-04 DIAGNOSIS — I10 Essential (primary) hypertension: Secondary | ICD-10-CM

## 2020-04-04 DIAGNOSIS — I6523 Occlusion and stenosis of bilateral carotid arteries: Secondary | ICD-10-CM

## 2020-04-04 MED ORDER — METOPROLOL SUCCINATE ER 25 MG PO TB24: 12.5000 mg | ORAL_TABLET | Freq: Every day | ORAL | 3 refills | Status: DC

## 2020-04-04 NOTE — Telephone Encounter (Signed)
Patient calling to change pharmacy and to request her medications to be sent to the new pharmacy being [facility] , insurance requesting 90 day supply to cover the medications  Please advise

## 2020-04-04 NOTE — Telephone Encounter (Signed)
What is the name of the medication? metoprolol succinate metoprolol succinate (TOPROL-XL) 25 MG 24 hr tablet   Have you contacted your pharmacy to request a refill? yes  Which [facility] you [facility] [address]    Patient notified that their request is being sent to the clinical staff for review and that they should receive a call once it is complete. If they do not receive a call within 72 hours they can check with their pharmacy or our office.    Pt stated that she will be out by Friday 04/08/2020  She also stated that she is now taking 1 complete pill daily and will need a 90 day supply for the insurance to cover her  medication

## 2020-04-07 ENCOUNTER — Other Ambulatory Visit: Payer: Self-pay

## 2020-04-07 DIAGNOSIS — E785 Hyperlipidemia, unspecified: Secondary | ICD-10-CM

## 2020-04-07 MED ORDER — ROSUVASTATIN CALCIUM 20 MG PO TABS: 20.0000 mg | ORAL_TABLET | Freq: Every day | ORAL | 1 refills | Status: DC

## 2020-04-07 NOTE — Telephone Encounter (Addendum)
Pt call said she she is out out all Her md please Advices What is the name of the medication?  Have you contacted your pharmacy to request a refill?  Which pharmacy would you like this sent to   Patient notified that their request is being sent to the clinical staff for review and that they should receive a call once it is complete. If they do not receive a call within 72 hours they can check with their pharmacy or our office. [facility]  Address: [address] [place], [place] [address]

## 2020-04-08 ENCOUNTER — Encounter: Payer: Self-pay | Admitting: General Practice

## 2020-04-25 ENCOUNTER — Other Ambulatory Visit: Payer: Self-pay | Admitting: Emergency Medicine

## 2020-04-25 DIAGNOSIS — F411 Generalized anxiety disorder: Secondary | ICD-10-CM

## 2020-04-25 MED ORDER — ESCITALOPRAM OXALATE 10 MG PO TABS: 10.0000 mg | ORAL_TABLET | Freq: Every day | ORAL | 1 refills | Status: DC

## 2020-04-25 NOTE — Telephone Encounter (Signed)
Copied from [place] [phone]. Topic: Quick Communication - Rx Refill/Question >> Apr 25, 2020 11:37 AM Leward Quan A wrote: Medication: escitalopram (LEXAPRO) 10 MG tablet  Changed pharmacy need new Rx sent   Has the patient contacted their pharmacy? Yes.   (Agent: If no, request that the patient contact the pharmacy for the refill.) (Agent: If yes, when and what did the pharmacy advise?)  Preferred Pharmacy (with phone number or street name[id number] [place], [place]  Phone:  [phone] Fax:  [phone]  Agent: Please be advised that RX refills may take up to 3 business days. We ask that you follow-up with your pharmacy.  Will route to office for final disposition.

## 2020-04-25 NOTE — Telephone Encounter (Signed)
Copied from [place] [id number]. Topic: Quick Communication - Rx Refill/Question >> Apr 25, 2020 11:37 AM Leward Quan A wrote: Medication: escitalopram (LEXAPRO) 10 MG tablet  Changed pharmacy need new Rx sent   Has the patient contacted their pharmacy? Yes.   (Agent: If no, request that the patient contact the pharmacy for the refill.) (Agent: If yes, when and what did the pharmacy advise?)  Preferred Pharmacy (with phone number or street name[id number] [place], [place]  Phone:  [phone] Fax:  [phone]     Agent: Please be advised that RX refills may take up to 3 business days. We ask that you follow-up with your pharmacy.

## 2020-04-25 NOTE — Addendum Note (Signed)
Addended by: Veneda Melter on: 04/25/2020 12:53 PM   Modules accepted: Orders

## 2020-04-25 NOTE — Telephone Encounter (Signed)
Patient is requesting a refill of the following medications: Requested Prescriptions   Pending Prescriptions Disp Refills  . escitalopram (LEXAPRO) 10 MG tablet 90 tablet 1    Sig: Take 1 tablet (10 mg total) by mouth daily.    Date of patient request: 04/25/2020 Last office visit: 03/23/2020 Date of last refill: 03/03/2020 Last refill amount: 90 tablets  Follow up time period per chart: 09/01/2020

## 2020-07-07 ENCOUNTER — Encounter: Payer: Self-pay | Admitting: Emergency Medicine

## 2020-07-07 ENCOUNTER — Ambulatory Visit: Payer: Managed Care, Other (non HMO) | Admitting: Emergency Medicine

## 2020-07-07 ENCOUNTER — Other Ambulatory Visit: Payer: Self-pay

## 2020-07-07 VITALS — BP 110/72 | HR 68 | Temp 98.6°F | Wt 200.0 lb

## 2020-07-07 DIAGNOSIS — I10 Essential (primary) hypertension: Secondary | ICD-10-CM

## 2020-07-07 DIAGNOSIS — F411 Generalized anxiety disorder: Secondary | ICD-10-CM | POA: Diagnosis not present

## 2020-07-07 DIAGNOSIS — F418 Other specified anxiety disorders: Secondary | ICD-10-CM | POA: Diagnosis not present

## 2020-07-07 DIAGNOSIS — R1030 Lower abdominal pain, unspecified: Secondary | ICD-10-CM

## 2020-07-07 DIAGNOSIS — K219 Gastro-esophageal reflux disease without esophagitis: Secondary | ICD-10-CM

## 2020-07-07 DIAGNOSIS — E785 Hyperlipidemia, unspecified: Secondary | ICD-10-CM

## 2020-07-07 DIAGNOSIS — I6523 Occlusion and stenosis of bilateral carotid arteries: Secondary | ICD-10-CM

## 2020-07-07 LAB — POCT URINALYSIS DIP (MANUAL ENTRY)
Bilirubin, UA: NEGATIVE
Blood, UA: NEGATIVE
Glucose, UA: NEGATIVE mg/dL
Nitrite, UA: NEGATIVE
Protein Ur, POC: NEGATIVE mg/dL
Spec Grav, UA: 1.015 (ref 1.010–1.025)
Urobilinogen, UA: 0.2 E.U./dL
pH, UA: 6 (ref 5.0–8.0)

## 2020-07-07 MED ORDER — METOPROLOL SUCCINATE ER 25 MG PO TB24: 12.5000 mg | ORAL_TABLET | Freq: Every day | ORAL | 3 refills | Status: DC

## 2020-07-07 MED ORDER — ROSUVASTATIN CALCIUM 20 MG PO TABS: 20.0000 mg | ORAL_TABLET | Freq: Every day | ORAL | 1 refills | Status: DC

## 2020-07-07 MED ORDER — NIFEDIPINE ER 30 MG PO TB24: ORAL_TABLET | ORAL | 3 refills | Status: DC

## 2020-07-07 MED ORDER — ALPRAZOLAM 0.5 MG PO TABS: ORAL_TABLET | ORAL | 0 refills | Status: DC

## 2020-07-07 MED ORDER — ESCITALOPRAM OXALATE 10 MG PO TABS: 10.0000 mg | ORAL_TABLET | Freq: Every day | ORAL | 1 refills | Status: DC

## 2020-07-07 MED ORDER — DICYCLOMINE HCL 20 MG PO TABS: 20.0000 mg | ORAL_TABLET | Freq: Four times a day (QID) | ORAL | 1 refills | Status: DC | PRN

## 2020-07-07 MED ORDER — OMEPRAZOLE 40 MG PO CPDR: 40.0000 mg | DELAYED_RELEASE_CAPSULE | Freq: Every day | ORAL | 3 refills | Status: DC

## 2020-07-07 NOTE — Patient Instructions (Addendum)
Start taking probiotics daily. Bentyl as needed for pain. Follow-up with GI doctor also for further evaluation.    If you have lab work done today you will be contacted with your lab results within the next 2 weeks.  If you have not heard from [place] then please contact us. The fastest way to get your results is to register for My Chart.   IF you received an x-ray today, you will receive an invoice from [facility]. Please contact [facility] at [phone] with questions or concerns regarding your invoice.   IF you received labwork today, you will receive an invoice from [facility]. Please contact [facility] at [phone] with questions or concerns regarding your invoice.   Our billing staff will not be able to assist you with questions regarding bills from these companies.  You will be contacted with the lab results as soon as they are available. The fastest way to get your results is to activate your My Chart account. Instructions are located on the last page of this paperwork. If you have not heard from [place] regarding the results in 2 weeks, please contact this office.     Dolor abdominal en los adultos Abdominal Pain, Adult El dolor de [place] (abdominal) puede tener muchas causas. [place] veces, el dolor de [place] no es peligroso. Muchos de [employer] de dolor de estmago pueden controlarse y tratarse en casa. Sin embargo, a Clinical cytogeneticist, Conservation officer, historic buildings de [place] es grave. El mdico intentar descubrir la causa del dolor de [place]. Siga estas instrucciones en su casa:  Medicamentos  [employer] de venta libre y los recetados solamente como se lo haya indicado el mdico.  No tome medicamentos que lo ayuden a Landscape architect (laxantes), salvo que el mdico se lo indique. Instrucciones generales  Est atento al dolor de estmago para Actuary cambio.  Beba suficiente lquido para Contractor pis (la orina) de color amarillo plido.  Concurra a todas  las visitas de seguimiento como se lo haya indicado el mdico. Esto es importante. Comunquese con un mdico si:  El dolor de estmago cambia o [place].  No tiene apetito o baja de peso sin proponrselo.  Tiene dificultades para defecar (est estreido) o heces lquidas (diarrea) durante ms de 2 o 3das.  Siente dolor al orinar o defecar.  El dolor de estmago lo despierta de noche.  El dolor empeora con las comidas, despus de comer o con determinados alimentos.  Tiene vmitos y no puede retener nada de lo que ingiere.  Tiene fiebre.  Observa sangre en la orina. Solicite ayuda de inmediato si:  El dolor no desaparece en el tiempo indicado por el mdico.  No puede dejar de vomitar.  Siente dolor solamente en zonas especficas del abdomen, como el lado derecho o la parte inferior izquierda.  Tiene heces con sangre, de color negro o con aspecto alquitranado.  Tiene dolor muy intenso en el vientre, clicos o meteorismo.  Presenta signos de no tener suficientes lquidos o agua en el cuerpo (deshidratacin), por ejemplo: ? [place] Else, muy escasa o falta de orina. ? Labios agrietados. ? Sequedad de boca. ? Ojos hundidos. ? Somnolencia. ? Debilidad.  Tiene dificultad para respirar o Tourist information centre manager. Resumen  Muchos de [employer] de dolor de estmago pueden controlarse y tratarse en casa.  Est atento al dolor de estmago para Actuary cambio.  Tome los medicamentos de venta libre y los recetados solamente como se lo haya indicado el mdico.  Comunquese con un mdico si el dolor de estmago cambia o [place].  Busque ayuda de inmediato si tiene dolor muy intenso en el vientre, clicos o meteorismo. Esta informacin no tiene Marine scientist el consejo del mdico. Asegrese de hacerle al mdico cualquier pregunta que tenga. Document Revised: 06/17/2019 Document Reviewed: 06/17/2019 Elsevier Patient Education  [place].

## 2020-07-07 NOTE — Progress Notes (Signed)
Emily [name] [facility] 64 y.o.   Chief Complaint  Patient presents with  . Abdominal Pain    for 2 weeks - cramping pt denies nausea or vomiting  . low blood pressure    per pt about 2 weeks ago    HISTORY OF PRESENT ILLNESS: This is a 64 y.o. female complaining of lower abdominal cramping for the past 2 weeks.  Had just reintroduced meat to her diet, pork and beef in particular.  Denies diarrhea.  No rectal bleeding.  Has history of GERD and takes omeprazole almost daily.  Requesting higher dose of 40 mg. No cardiac history.  Cardiology work-up about 2 years ago including stress test was within normal limits.  Has history of hypertension with periodic episodes of hypotension.  Not new.  Advised by cardiologist to take metoprolol every other day and continue nifedipine that she has been taking for over 20 years. Denies urinary symptoms or hematuria. Able to eat and drink.  Denies nausea or vomiting. Denies any other significant symptoms. No other complaints or medical concerns today.  HPI   Prior to Admission medications   Medication Sig Start Date End Date Taking? Authorizing Provider  ALPRAZolam Duanne Moron) 0.5 MG tablet TAKE 1 TABLET BY MOUTH EVERY DAY AS NEEDED FOR ANXIETY 03/03/20   Horald Pollen, MD  CALCIUM PO Take 1 tablet by mouth daily.     [provider]  escitalopram (LEXAPRO) 10 MG tablet Take 1 tablet (10 mg total) by mouth daily. 04/25/20 07/24/20  Horald Pollen, MD  metoprolol succinate (TOPROL-XL) 25 MG 24 hr tablet Take 0.5 tablets (12.5 mg total) by mouth daily. TAKE 1/2 TABLET BY MOUTH  DAILY AT LUNCH OR IN THE  AFTERNOON 04/04/20 07/03/20  Horald Pollen, MD  NIFEdipine (ADALAT CC) 30 MG 24 hr tablet TAKE 1 TABLET(30 MG) BY MOUTH DAILY 03/03/20   Horald Pollen, MD  omeprazole (PRILOSEC) 20 MG capsule Take 1 capsule (20 mg total) by mouth daily. 03/23/20   Wendall Mola, NP  rosuvastatin (CRESTOR) 20 MG tablet Take 1 tablet (20 mg  total) by mouth daily. 04/07/20 07/06/20  Horald Pollen, MD    Allergies  Allergen Reactions  . Ibuprofen   . Aspirin Hives  . Atorvastatin     Yellow fingernails  . Nsaids Hives    Patient Active Problem List   Diagnosis Date Noted  . Chronic bilateral low back pain without sciatica 06/30/2019  . Carotid artery disease ([place]) 12/19/2018  . Bilateral carotid artery stenosis, R- 40-59%, L 1-39% 11/2017 03/25/2018  . Dyslipidemia 03/25/2018  . GERD (gastroesophageal reflux disease) 04/12/2016  . Chronic high back pain 09/24/2015  . HTN (hypertension) 10/03/2012  . Hyperlipidemia 10/03/2012  . DYSPEPSIA&OTHER [facility] DISORDERS FUNCTION STOMACH 07/20/2009    Past Medical History:  Diagnosis Date  . Abdominal pain, chronic, epigastric 11/02/2015   Started amitriptyline at hospitalization 04/13/2016 EGD x 2 negative Also w/ chronic chest wall and back pain Would try not to work up further   . Anxiety   . Arthritis   . Back pain   . Beta thalassemia minor    diagnosed by hematology Dr. Jennette Kettle, also with sickle cell trait  . Chronic high back pain 09/24/2015  . Colon polyps   . GERD (gastroesophageal reflux disease)   . Hyperlipidemia   . Hypertension   . Sickle cell trait ([place])    diagnosed by hematology Dr. Lindi Adie, coexsisting with beta-thalassemia minor    Past Surgical  History:  Procedure Laterality Date  . ABDOMINAL HYSTERECTOMY    . COLONOSCOPY    . ESOPHAGOGASTRODUODENOSCOPY    . ESOPHAGOGASTRODUODENOSCOPY [name] 04/13/2016   Procedure: ESOPHAGOGASTRODUODENOSCOPY (EGD);  Surgeon: Gatha Mayer, MD;  Location: [facility];  Service: Endoscopy;  Laterality: [name];    Social History   Socioeconomic History  . Marital status: Married    Spouse name: Not on file  . Number of children: 3  . Years of education: Not on file  . Highest education level: Not on file  Occupational History  . Not on file  Tobacco Use  . Smoking status: Never Smoker  . Smokeless  tobacco: Never Used  Vaping Use  . Vaping Use: Never used  Substance and Sexual Activity  . Alcohol use: No    Alcohol/week: 0.0 standard drinks  . Drug use: No  . Sexual activity: Yes    Comment: 1st intercourse- 89, partner-1, married- 18 yrs   Other Topics Concern  . Not on file  Social History Narrative   Lives with husband and works at [employer] Strain:   . Difficulty of Paying Living Expenses:   Food Insecurity:   . Worried About Charity fundraiser in the Last Year:   . Arboriculturist in the Last Year:   Transportation Needs:   . Film/video editor (Medical):   Marland Kitchen Lack of Transportation (Non-Medical):   Physical Activity:   . Days of Exercise per Week:   . Minutes of Exercise per Session:   Stress:   . Feeling of Stress :   Social Connections:   . Frequency of Communication with Friends and Family:   . Frequency of Social Gatherings with Friends and Family:   . Attends Religious Services:   . Active Member of Clubs or Organizations:   . Attends Archivist Meetings:   Marland Kitchen Marital Status:   Intimate Partner Violence:   . Fear of Current or Ex-Partner:   . Emotionally Abused:   Marland Kitchen Physically Abused:   . Sexually Abused:     Family History  Problem Relation Age of Onset  . Aneurysm Mother   . Diabetes Father   . Colon cancer Brother 70  . Liver cancer Brother   . Esophageal cancer Neg Hx   . Stomach cancer Neg Hx   . Pancreatic cancer Neg Hx      Review of Systems  Constitutional: Negative.  Negative for chills and fever.  HENT: Negative.  Negative for congestion and sore throat.   Respiratory: Negative.  Negative for cough and shortness of breath.   Cardiovascular: Negative.  Negative for chest pain and palpitations.  Gastrointestinal: Positive for abdominal pain. Negative for blood in stool, constipation, diarrhea, melena, nausea and vomiting.  Genitourinary: Negative.  Negative for  dysuria and hematuria.  Musculoskeletal: Negative.  Negative for myalgias and neck pain.  Skin: Negative.  Negative for rash.  Neurological: Negative.  Negative for dizziness and headaches.  All other systems reviewed and are negative.   Today's Vitals   07/07/20 1615  BP: 110/72  Pulse: 68  Temp: 98.6 F (37 C)  TempSrc: Temporal  Weight: 200 lb (90.7 kg)   Wt Readings from Last 3 Encounters:  07/07/20 200 lb (90.7 kg)  03/23/20 205 lb 9.6 oz (93.3 kg)  03/03/20 206 lb (93.4 kg)    Body mass index is 32.28 kg/m. BP Readings from Last 3 Encounters:  07/07/20  110/72  03/23/20 130/78  03/03/20 132/83    Physical Exam Vitals reviewed.  Constitutional:      Appearance: She is well-developed.  HENT:     Head: Normocephalic.     Mouth/Throat:     Mouth: Mucous membranes are moist.     Pharynx: Oropharynx is clear.  Eyes:     Extraocular Movements: Extraocular movements intact.     Conjunctiva/sclera: Conjunctivae normal.     Pupils: Pupils are equal, round, and reactive to light.  Cardiovascular:     Rate and Rhythm: Normal rate and regular rhythm.     Pulses: Normal pulses.     Heart sounds: Normal heart sounds.  Pulmonary:     Effort: Pulmonary effort is normal.     Breath sounds: Normal breath sounds.  Abdominal:     General: Bowel sounds are normal. There is no distension.     Palpations: Abdomen is soft.     Tenderness: There is no abdominal tenderness.  Musculoskeletal:        General: Normal range of motion.     Cervical back: Normal range of motion and neck supple.  Skin:    General: Skin is warm and dry.     Capillary Refill: Capillary refill takes less than 2 seconds.  Neurological:     General: No focal deficit present.     Mental Status: She is alert and oriented to person, place, and time.  Psychiatric:        Mood and Affect: Mood normal.        Behavior: Behavior normal.    Results for orders placed or performed in visit on 07/07/20 (from  the past 24 hour(s))  POCT urinalysis dipstick     Status: Abnormal   Collection Time: 07/07/20  4:49 PM  Result Value Ref Range   Color, UA yellow yellow   Clarity, UA clear clear   Glucose, UA negative negative mg/dL   Bilirubin, UA negative negative   Ketones, POC UA trace (5) (A) negative mg/dL   Spec Grav, UA 1.015 1.010 - 1.025   Blood, UA negative negative   pH, UA 6.0 5.0 - 8.0   Protein Ur, POC negative negative mg/dL   Urobilinogen, UA 0.2 0.2 or 1.0 E.U./dL   Nitrite, UA Negative Negative   Leukocytes, UA Trace (A) Negative   A total of 30 minutes was spent with the patient, greater than 50% of which was in counseling/coordination of care regarding differential diagnosis of abdominal pain, need for diagnostic work-up, review of most recent office visit notes, review of most recent blood work results and today's urinalysis, diet and nutrition in particular avoiding meat, review of all medications and new one Bentyl prognosis, ED precautions, and need for follow-up.   ASSESSMENT & PLAN: Jahnia was seen today for abdominal pain and low blood pressure.  Diagnoses and all orders for this visit:  Lower abdominal pain -     Urine Culture -     CBC with Differential/Platelet -     Comprehensive metabolic panel -     Lipase -     POCT urinalysis dipstick -     dicyclomine (BENTYL) 20 MG tablet; Take 1 tablet (20 mg total) by mouth every 6 (six) hours as needed for spasms. -     Ambulatory referral to Gastroenterology  Gastroesophageal reflux disease without esophagitis -     omeprazole (PRILOSEC) 40 MG capsule; Take 1 capsule (40 mg total) by mouth daily. -  Ambulatory referral to Gastroenterology  Situational anxiety -     ALPRAZolam (XANAX) 0.5 MG tablet; TAKE 1 TABLET BY MOUTH EVERY DAY AS NEEDED FOR ANXIETY  Generalized anxiety disorder -     escitalopram (LEXAPRO) 10 MG tablet; Take 1 tablet (10 mg total) by mouth daily.  Essential hypertension -     NIFEdipine  (ADALAT CC) 30 MG 24 hr tablet; TAKE 1 TABLET(30 MG) BY MOUTH DAILY -     metoprolol succinate (TOPROL-XL) 25 MG 24 hr tablet; Take 0.5 tablets (12.5 mg total) by mouth daily. TAKE 1/2 TABLET BY MOUTH  DAILY AT LUNCH OR IN THE  AFTERNOON  Dyslipidemia -     rosuvastatin (CRESTOR) 20 MG tablet; Take 1 tablet (20 mg total) by mouth daily.  Bilateral carotid artery stenosis, R- 40-59%, L 1-39% 11/2017 -     metoprolol succinate (TOPROL-XL) 25 MG 24 hr tablet; Take 0.5 tablets (12.5 mg total) by mouth daily. TAKE 1/2 TABLET BY MOUTH  DAILY AT LUNCH OR IN THE  AFTERNOON    Patient Instructions   Start taking probiotics daily. Bentyl as needed for pain. Follow-up with GI doctor also for further evaluation.    If you have lab work done today you will be contacted with your lab results within the next 2 weeks.  If you have not heard from [place] then please contact us. The fastest way to get your results is to register for My Chart.   IF you received an x-ray today, you will receive an invoice from [facility]. Please contact [facility] at [phone] with questions or concerns regarding your invoice.   IF you received labwork today, you will receive an invoice from [place]. Please contact [facility] at [phone] with questions or concerns regarding your invoice.   Our billing staff will not be able to assist you with questions regarding bills from these companies.  You will be contacted with the lab results as soon as they are available. The fastest way to get your results is to activate your My Chart account. Instructions are located on the last page of this paperwork. If you have not heard from [place] regarding the results in 2 weeks, please contact this office.     Dolor abdominal en los adultos Abdominal Pain, Adult El dolor de [place] (abdominal) puede tener muchas causas. [place] veces, el dolor de [place] no es peligroso. Muchos de [employer] de dolor de  estmago pueden controlarse y tratarse en casa. Sin embargo, a Clinical cytogeneticist, Conservation officer, historic buildings de [place] es grave. El mdico intentar descubrir la causa del dolor de [place]. Siga estas instrucciones en su casa:  Medicamentos  [employer] de venta libre y los recetados solamente como se lo haya indicado el mdico.  No tome medicamentos que lo ayuden a Landscape architect (laxantes), salvo que el mdico se lo indique. Instrucciones generales  Est atento al dolor de estmago para Actuary cambio.  Beba suficiente lquido para Contractor pis (la orina) de color amarillo plido.  Concurra a todas las visitas de seguimiento como se lo haya indicado el mdico. Esto es importante. Comunquese con un mdico si:  El dolor de estmago cambia o [place].  No tiene apetito o baja de peso sin proponrselo.  Tiene dificultades para defecar (est estreido) o heces lquidas (diarrea) durante ms de 2 o 3das.  Siente dolor al orinar o defecar.  El dolor de estmago lo despierta de noche.  El dolor empeora con las comidas, despus  de comer o con determinados alimentos.  Tiene vmitos y no puede retener nada de lo que ingiere.  Tiene fiebre.  Observa sangre en la orina. Solicite ayuda de inmediato si:  El dolor no desaparece en el tiempo indicado por el mdico.  No puede dejar de vomitar.  Siente dolor solamente en zonas especficas del abdomen, como el lado derecho o la parte inferior izquierda.  Tiene heces con sangre, de color negro o con aspecto alquitranado.  Tiene dolor muy intenso en el vientre, clicos o meteorismo.  Presenta signos de no tener suficientes lquidos o agua en el cuerpo (deshidratacin), por ejemplo: ? [place] Else, muy escasa o falta de orina. ? Labios agrietados. ? Sequedad de boca. ? Ojos hundidos. ? Somnolencia. ? Debilidad.  Tiene dificultad para respirar o Tourist information centre manager. Resumen  Muchos de [employer] de dolor de estmago pueden controlarse y  tratarse en casa.  Est atento al dolor de estmago para Actuary cambio.  Tome los medicamentos de venta libre y los recetados solamente como se lo haya indicado el mdico.  Comunquese con un mdico si el dolor de estmago cambia o [place].  Busque ayuda de inmediato si tiene dolor muy intenso en el vientre, clicos o meteorismo. Esta informacin no tiene Marine scientist el consejo del mdico. Asegrese de hacerle al mdico cualquier pregunta que tenga. Document Revised: 06/17/2019 Document Reviewed: 06/17/2019 Elsevier Patient Education  2020 Elsevier Inc.      Agustina Caroli, MD [facility]

## 2020-07-08 LAB — CBC WITH DIFFERENTIAL/PLATELET
Basophils Absolute: 0.1 10*3/uL (ref 0.0–0.2)
Basos: 1 %
EOS (ABSOLUTE): 0.2 10*3/uL (ref 0.0–0.4)
Eos: 3 %
Hematocrit: 38.2 % (ref 34.0–46.6)
Hemoglobin: 12.5 g/dL (ref 11.1–15.9)
Immature Grans (Abs): 0 10*3/uL (ref 0.0–0.1)
Immature Granulocytes: 0 %
Lymphocytes Absolute: 2.5 10*3/uL (ref 0.7–3.1)
Lymphs: 34 %
MCH: 25.1 pg — ABNORMAL LOW (ref 26.6–33.0)
MCHC: 32.7 g/dL (ref 31.5–35.7)
MCV: 77 fL — ABNORMAL LOW (ref 79–97)
Monocytes Absolute: 0.7 10*3/uL (ref 0.1–0.9)
Monocytes: 10 %
Neutrophils Absolute: 3.8 10*3/uL (ref 1.4–7.0)
Neutrophils: 52 %
Platelets: 263 10*3/uL (ref 150–450)
RBC: 4.99 x10E6/uL (ref 3.77–5.28)
RDW: 14.2 % (ref 11.7–15.4)
WBC: 7.3 10*3/uL (ref 3.4–10.8)

## 2020-07-08 LAB — COMPREHENSIVE METABOLIC PANEL
ALT: 20 IU/L (ref 0–32)
AST: 18 IU/L (ref 0–40)
Albumin/Globulin Ratio: 1.4 (ref 1.2–2.2)
Albumin: 4.4 g/dL (ref 3.8–4.8)
Alkaline Phosphatase: 142 IU/L — ABNORMAL HIGH (ref 48–121)
BUN/Creatinine Ratio: 18 (ref 12–28)
BUN: 12 mg/dL (ref 8–27)
Bilirubin Total: 0.2 mg/dL (ref 0.0–1.2)
CO2: 28 mmol/L (ref 20–29)
Calcium: 9.8 mg/dL (ref 8.7–10.3)
Chloride: 100 mmol/L (ref 96–106)
Creatinine, Ser: 0.67 mg/dL (ref 0.57–1.00)
GFR calc Af Amer: 109 mL/min/{1.73_m2} (ref >59)
GFR calc non Af Amer: 95 mL/min/{1.73_m2} (ref >59)
Globulin, Total: 3.2 g/dL (ref 1.5–4.5)
Glucose: 77 mg/dL (ref 65–99)
Potassium: 4.8 mmol/L (ref 3.5–5.2)
Sodium: 138 mmol/L (ref 134–144)
Total Protein: 7.6 g/dL (ref 6.0–8.5)

## 2020-07-08 LAB — LIPASE: Lipase: 24 U/L (ref 14–72)

## 2020-07-09 LAB — URINE CULTURE

## 2020-07-11 ENCOUNTER — Telehealth: Payer: Self-pay

## 2020-07-11 NOTE — Telephone Encounter (Signed)
Patient's been informed per drs normal lab result notes. She verbalized understanding.

## 2020-07-19 IMAGING — US US ABDOMEN COMPLETE
1 series · 14 of 25 positions shown · non-contrast
Comparison: CT abdomen and pelvis November 21, 2017

CLINICAL DATA: Abdominal pain and nausea

EXAM:
ABDOMEN ULTRASOUND COMPLETE

[Series 1: us abdomen complete · 0.17mm/px · 14 of 74 slices shown]
[im 1/74]
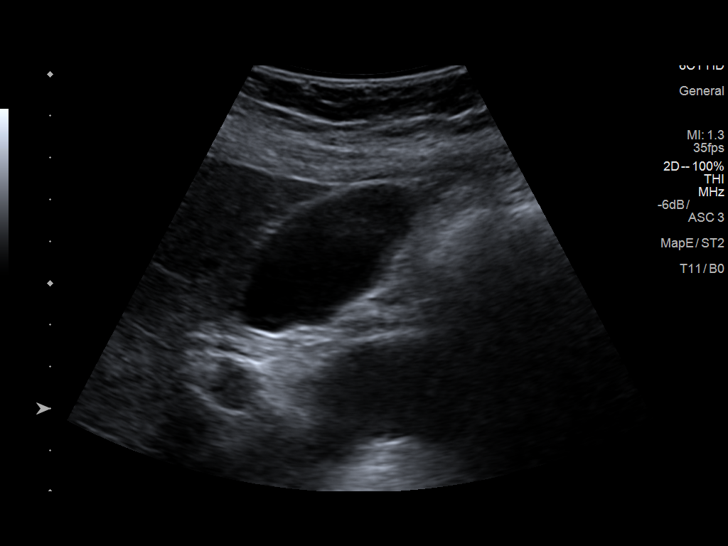
[im 7/74]
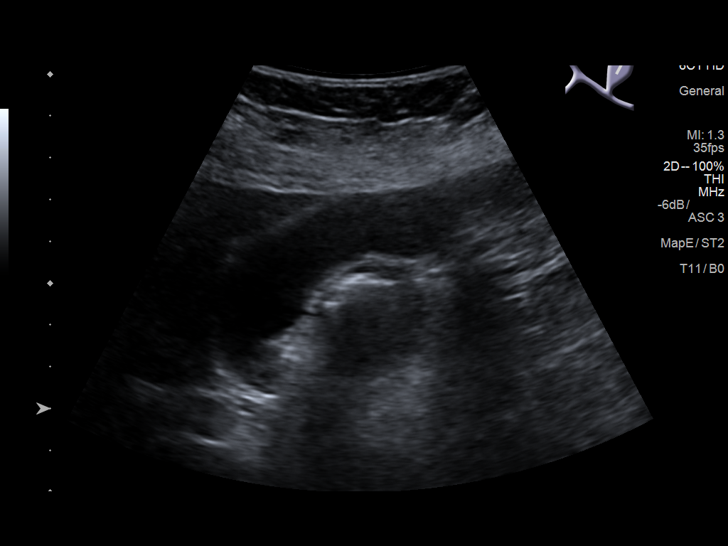
[im 13/74]
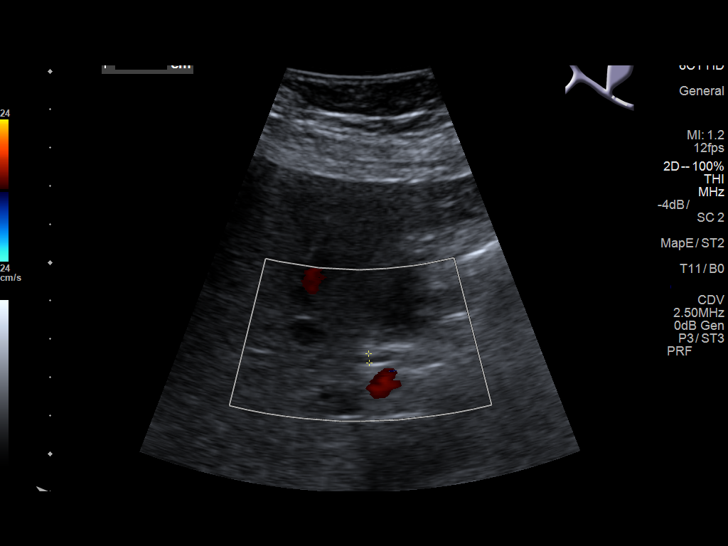
[im 19/74]
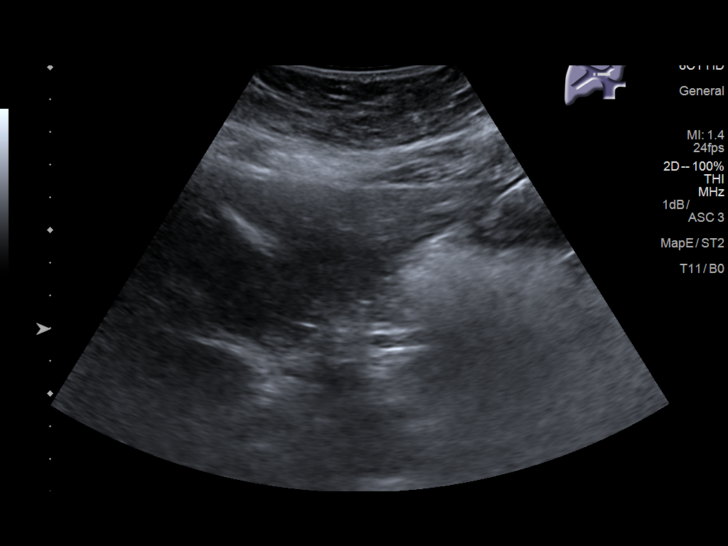
[im 25/74]
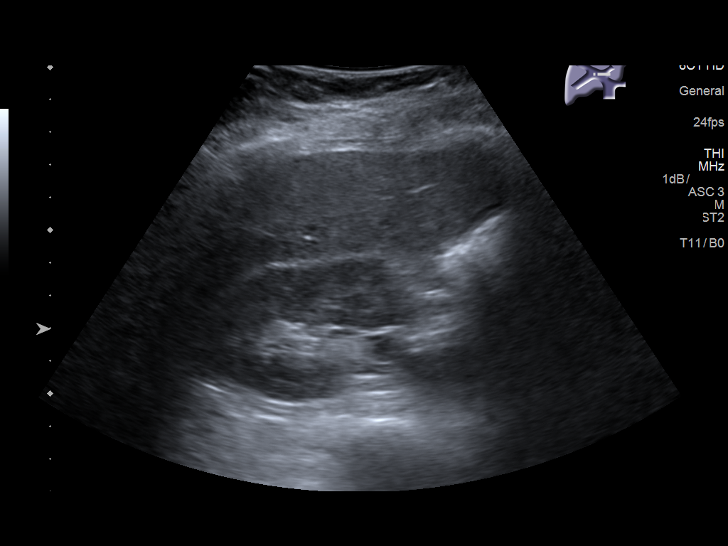
[im 28/74]
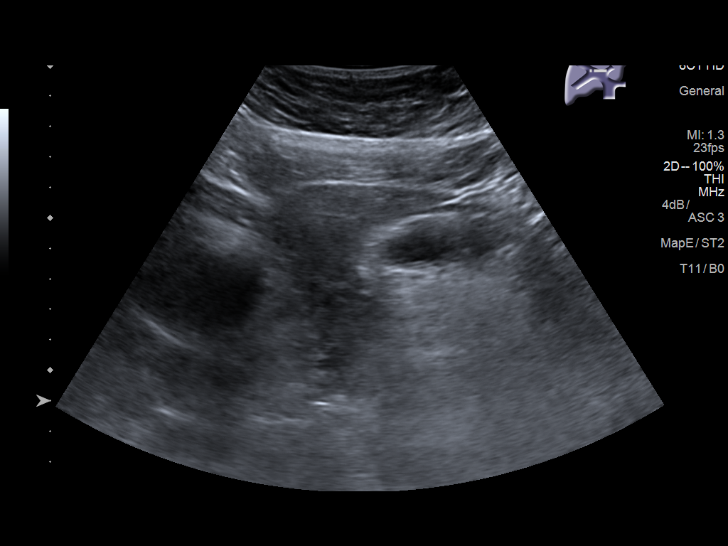
[im 34/74]
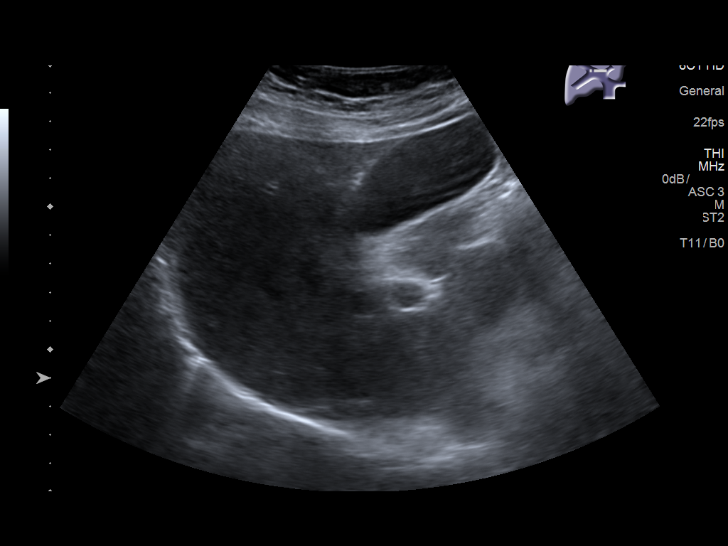
[im 40/74]
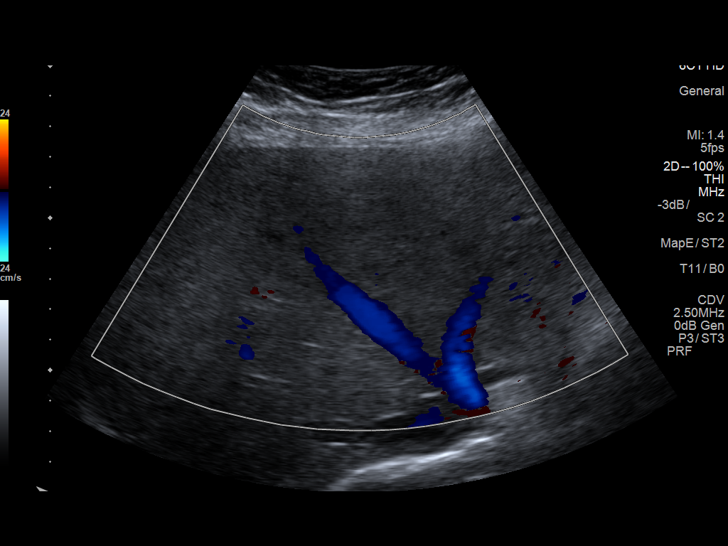
[im 46/74]
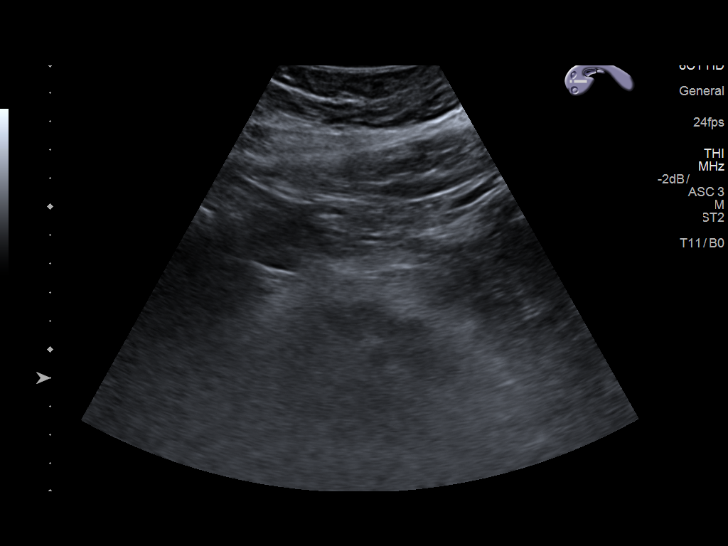
[im 49/74]
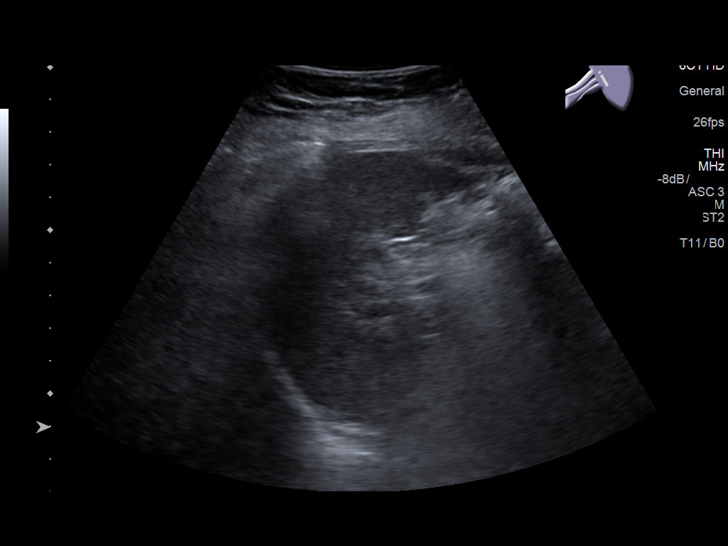
[im 55/74]
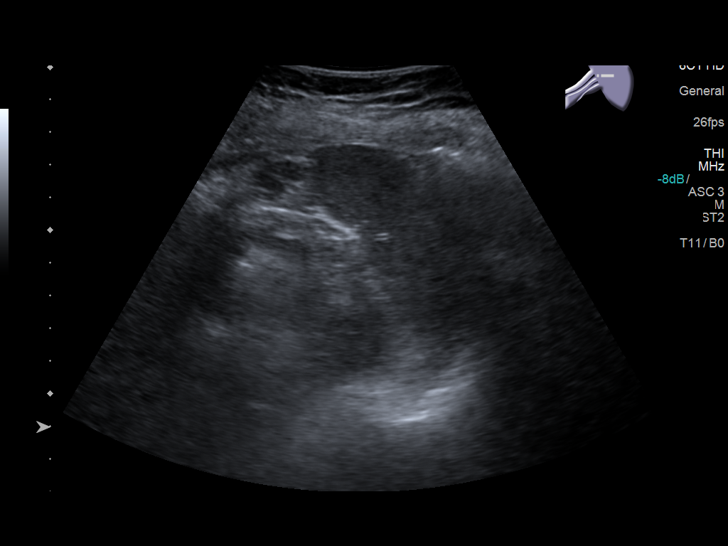
[im 61/74]
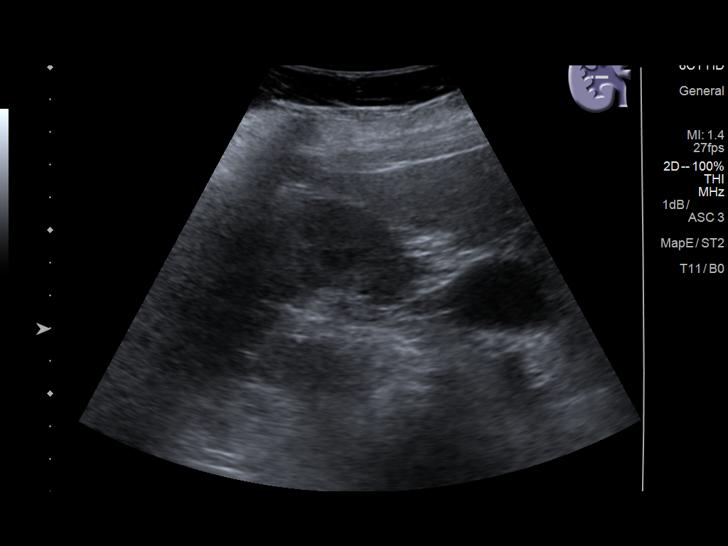
[im 67/74]
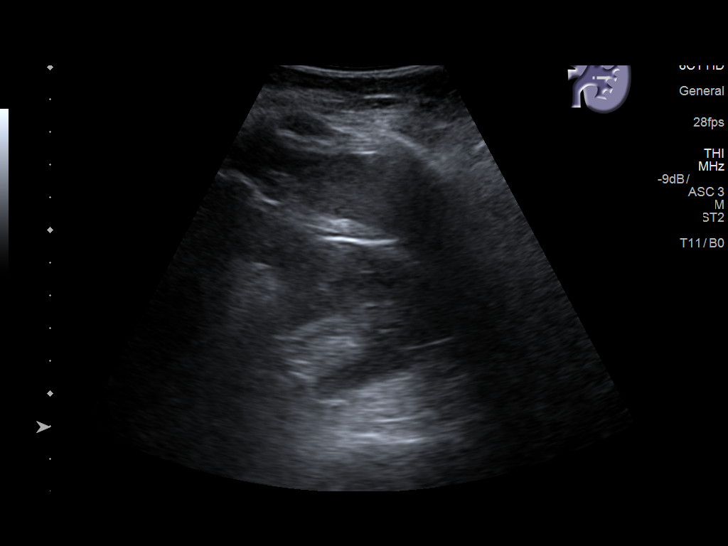
[im 74/74]
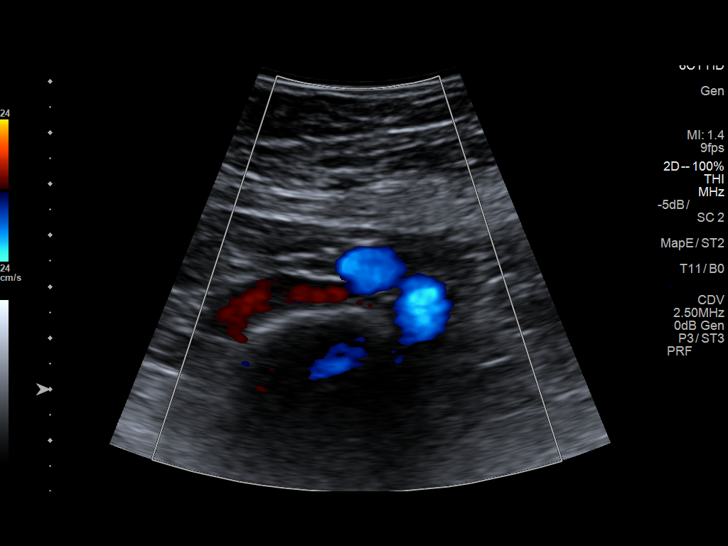

[14 of 25 positions shown; findings below may reference images not displayed]

FINDINGS: Gallbladder: No gallstones or wall thickening visualized. There is
no pericholecystic fluid. No sonographic Murphy sign noted by
sonographer.

Common bile duct: Diameter: 2 mm. No intrahepatic, common hepatic,
or common bile duct dilatation.

Liver: No focal lesion identified. Within normal limits in
parenchymal echogenicity. Portal vein is patent on color Doppler
imaging with normal direction of blood flow towards the liver.

IVC: No abnormality visualized.

Pancreas: Visualized portion unremarkable. Portions of pancreas
obscured by gas.

Spleen: Size and appearance within normal limits.

Right Kidney: Length: 12.0 cm. Echogenicity within normal limits. No
mass or hydronephrosis visualized.

Left Kidney: Length: 11.4 cm. Echogenicity within normal limits. No
mass or hydronephrosis visualized.

Abdominal aorta: No aneurysm visualized.

Other findings: No demonstrable ascites.
IMPRESSION: Portions of pancreas obscured by gas. Visualized portions of
pancreas appear normal.

Study otherwise unremarkable.

## 2020-08-11 ENCOUNTER — Telehealth: Payer: Self-pay | Admitting: Emergency Medicine

## 2020-08-11 NOTE — Telephone Encounter (Signed)
Disregard

## 2020-08-15 ENCOUNTER — Ambulatory Visit: Payer: Managed Care, Other (non HMO) | Admitting: Emergency Medicine

## 2020-08-15 ENCOUNTER — Other Ambulatory Visit: Payer: Self-pay

## 2020-08-15 ENCOUNTER — Telehealth: Payer: Self-pay | Admitting: *Deleted

## 2020-08-15 ENCOUNTER — Encounter: Payer: Self-pay | Admitting: Emergency Medicine

## 2020-08-15 VITALS — BP 104/68 | HR 85 | Temp 98.0°F | Resp 16 | Ht 66.0 in | Wt 199.8 lb

## 2020-08-15 DIAGNOSIS — I10 Essential (primary) hypertension: Secondary | ICD-10-CM

## 2020-08-15 DIAGNOSIS — Z8739 Personal history of other diseases of the musculoskeletal system and connective tissue: Secondary | ICD-10-CM

## 2020-08-15 DIAGNOSIS — R1013 Epigastric pain: Secondary | ICD-10-CM

## 2020-08-15 DIAGNOSIS — Z8719 Personal history of other diseases of the digestive system: Secondary | ICD-10-CM | POA: Diagnosis not present

## 2020-08-15 MED ORDER — FAMOTIDINE 40 MG PO TABS: 40.0000 mg | ORAL_TABLET | Freq: Every day | ORAL | 0 refills | Status: DC

## 2020-08-15 NOTE — Patient Instructions (Addendum)
Call [facility] at [phone] to schedule appointment.     If you have lab work done today you will be contacted with your lab results within the next 2 weeks.  If you have not heard from [place] then please contact us. The fastest way to get your results is to register for My Chart.   IF you received an x-ray today, you will receive an invoice from [facility]. Please contact [facility] at [phone] with questions or concerns regarding your invoice.   IF you received labwork today, you will receive an invoice from [place]. Please contact [facility] at [phone] with questions or concerns regarding your invoice.   Our billing staff will not be able to assist you with questions regarding bills from these companies.  You will be contacted with the lab results as soon as they are available. The fastest way to get your results is to activate your My Chart account. Instructions are located on the last page of this paperwork. If you have not heard from [place] regarding the results in 2 weeks, please contact this office.     Gastroesophageal Reflux Disease, Adult Gastroesophageal reflux (GER) happens when acid from the stomach flows up into the tube that connects the mouth and the stomach (esophagus). Normally, food travels down the esophagus and stays in the stomach to be digested. With GER, food and stomach acid sometimes move back up into the esophagus. You may have a disease called gastroesophageal reflux disease (GERD) if the reflux:  Happens often.  Causes frequent or very bad symptoms.  Causes problems such as damage to the esophagus. When this happens, the esophagus becomes sore and swollen (inflamed). Over time, GERD can make small holes (ulcers) in the lining of the esophagus. What are the causes? This condition is caused by a problem with the muscle between the esophagus and the stomach. When this muscle is weak or not normal, it does not close properly to keep food  and acid from coming back up from the stomach. The muscle can be weak because of:  Tobacco use.  Pregnancy.  Having a certain type of hernia (hiatal hernia).  Alcohol use.  Certain foods and drinks, such as coffee, chocolate, onions, and peppermint. What increases the risk? You are more likely to develop this condition if you:  Are overweight.  Have a disease that affects your connective tissue.  Use NSAID medicines. What are the signs or symptoms? Symptoms of this condition include:  Heartburn.  Difficult or painful swallowing.  The feeling of having a lump in the throat.  A bitter taste in the mouth.  Bad breath.  Having a lot of saliva.  Having an upset or bloated stomach.  Belching.  Chest pain. Different conditions can cause chest pain. Make sure you see your doctor if you have chest pain.  Shortness of breath or noisy breathing (wheezing).  Ongoing (chronic) cough or a cough at night.  Wearing away of the surface of teeth (tooth enamel).  Weight loss. How is this treated? Treatment will depend on how bad your symptoms are. Your doctor may suggest:  Changes to your diet.  Medicine.  Surgery. Follow these instructions at home: Eating and drinking   Follow a diet as told by your doctor. You may need to avoid foods and drinks such as: ? Coffee and tea (with or without caffeine). ? Drinks that contain alcohol. ? Energy drinks and sports drinks. ? Bubbly (carbonated) drinks or sodas. ? Chocolate and cocoa. ? Peppermint  and mint flavorings. ? Garlic and onions. ? Horseradish. ? Spicy and acidic foods. These include peppers, chili powder, curry powder, vinegar, hot sauces, and BBQ sauce. ? Citrus fruit juices and citrus fruits, such as oranges, lemons, and limes. ? Tomato-based foods. These include red sauce, chili, salsa, and pizza with red sauce. ? Fried and fatty foods. These include donuts, french fries, potato chips, and high-fat  dressings. ? High-fat meats. These include hot dogs, rib eye steak, sausage, ham, and bacon. ? High-fat dairy items, such as whole milk, butter, and cream cheese.  Eat small meals often. Avoid eating large meals.  Avoid drinking large amounts of liquid with your meals.  Avoid eating meals during the 2-3 hours before bedtime.  Avoid lying down right after you eat.  Do not exercise right after you eat. Lifestyle   Do not use any products that contain nicotine or tobacco. These include cigarettes, e-cigarettes, and chewing tobacco. If you need help quitting, ask your doctor.  Try to lower your stress. If you need help doing this, ask your doctor.  If you are overweight, lose an amount of weight that is healthy for you. Ask your doctor about a safe weight loss goal. General instructions  Pay attention to any changes in your symptoms.  Take over-the-counter and prescription medicines only as told by your doctor. Do not take aspirin, ibuprofen, or other NSAIDs unless your doctor says it is okay.  Wear loose clothes. Do not wear anything tight around your waist.  Raise (elevate) the head of your bed about 6 inches (15 cm).  Avoid bending over if this makes your symptoms worse.  Keep all follow-up visits as told by your doctor. This is important. Contact a doctor if:  You have new symptoms.  You lose weight and you do not know why.  You have trouble swallowing or it hurts to swallow.  You have wheezing or a cough that keeps happening.  Your symptoms do not get better with treatment.  You have a hoarse voice. Get help right away if:  You have pain in your arms, neck, jaw, teeth, or back.  You feel sweaty, dizzy, or light-headed.  You have chest pain or shortness of breath.  You throw up (vomit) and your throw-up looks like blood or coffee grounds.  You pass out (faint).  Your poop (stool) is bloody or black.  You cannot swallow, drink, or eat. Summary  If a  person has gastroesophageal reflux disease (GERD), food and stomach acid move back up into the esophagus and cause symptoms or problems such as damage to the esophagus.  Treatment will depend on how bad your symptoms are.  Follow a diet as told by your doctor.  Take all medicines only as told by your doctor. This information is not intended to replace advice given to you by your health care provider. Make sure you discuss any questions you have with your health care provider. Document Revised: 06/18/2018 Document Reviewed: 06/18/2018 Elsevier Patient Education  [place].

## 2020-08-15 NOTE — Progress Notes (Signed)
Emily [name] [facility] 64 y.o.   Chief Complaint  Patient presents with  . Abdominal Pain    pt reports abdominal pain the last 2 -3 weeks, was seen for similar issue 07/07/2020,  always on the lower part of her abdomen, has not changed since then was improved but not compltely    HISTORY OF PRESENT ILLNESS: This is a 64 y.o. female complaining of upper abdominal pain for the past 2 to 3 weeks. States she had endoscopy in the past and showed hiatal hernia. Describes pain as burning and localized to epigastrium. Able to eat and drink. Denies nausea or vomiting. Denies melena or diarrhea. Denies hematemesis. Denies fever or chills. Was referred to GI last July. No other complaints or medical concerns today.  HPI   Prior to Admission medications   Medication Sig Start Date End Date Taking? Authorizing Provider  ALPRAZolam Duanne Moron) 0.5 MG tablet TAKE 1 TABLET BY MOUTH EVERY DAY AS NEEDED FOR ANXIETY 07/07/20  Yes Glenetta Kiger, Ines Bloomer, MD  CALCIUM PO Take 1 tablet by mouth daily.    Yes [provider]  dicyclomine (BENTYL) 20 MG tablet Take 1 tablet (20 mg total) by mouth every 6 (six) hours as needed for spasms. 07/07/20  Yes Jsoeph Podesta, Ines Bloomer, MD  escitalopram (LEXAPRO) 10 MG tablet Take 1 tablet (10 mg total) by mouth daily. 07/07/20 10/05/20 Yes Norely Schlick, Ines Bloomer, MD  metoprolol succinate (TOPROL-XL) 25 MG 24 hr tablet Take 0.5 tablets (12.5 mg total) by mouth daily. TAKE 1/2 TABLET BY MOUTH  DAILY AT LUNCH OR IN THE  AFTERNOON 07/07/20 10/05/20 Yes Horald Pollen, MD  NIFEdipine (ADALAT CC) 30 MG 24 hr tablet TAKE 1 TABLET(30 MG) BY MOUTH DAILY 07/07/20  Yes Denetta Fei, Ines Bloomer, MD  omeprazole (PRILOSEC) 40 MG capsule Take 1 capsule (40 mg total) by mouth daily. 07/07/20  Yes Carmilla Granville, Ines Bloomer, MD  rosuvastatin (CRESTOR) 20 MG tablet Take 1 tablet (20 mg total) by mouth daily. 07/07/20 10/05/20 Yes Eastlawn GardensInes Bloomer, MD    Allergies  Allergen Reactions  .  Ibuprofen   . Aspirin Hives  . Atorvastatin     Yellow fingernails  . Nsaids Hives    Patient Active Problem List   Diagnosis Date Noted  . Chronic bilateral low back pain without sciatica 06/30/2019  . Carotid artery disease ([place]) 12/19/2018  . Bilateral carotid artery stenosis, R- 40-59%, L 1-39% 11/2017 03/25/2018  . Dyslipidemia 03/25/2018  . GERD (gastroesophageal reflux disease) 04/12/2016  . Chronic high back pain 09/24/2015  . HTN (hypertension) 10/03/2012  . Hyperlipidemia 10/03/2012  . DYSPEPSIA&OTHER [facility] DISORDERS FUNCTION STOMACH 07/20/2009    Past Medical History:  Diagnosis Date  . Abdominal pain, chronic, epigastric 11/02/2015   Started amitriptyline at hospitalization 04/13/2016 EGD x 2 negative Also w/ chronic chest wall and back pain Would try not to work up further   . Anxiety   . Arthritis   . Back pain   . Beta thalassemia minor    diagnosed by hematology Dr. Jennette Kettle, also with sickle cell trait  . Chronic high back pain 09/24/2015  . Colon polyps   . GERD (gastroesophageal reflux disease)   . Hyperlipidemia   . Hypertension   . Sickle cell trait ([place])    diagnosed by hematology Dr. Lindi Adie, coexsisting with beta-thalassemia minor    Past Surgical History:  Procedure Laterality Date  . ABDOMINAL HYSTERECTOMY    . COLONOSCOPY    . ESOPHAGOGASTRODUODENOSCOPY    . ESOPHAGOGASTRODUODENOSCOPY  [name] 04/13/2016   Procedure: ESOPHAGOGASTRODUODENOSCOPY (EGD);  Surgeon: Gatha Mayer, MD;  Location: [facility];  Service: Endoscopy;  Laterality: [name];    Social History   Socioeconomic History  . Marital status: Married    Spouse name: Not on file  . Number of children: 3  . Years of education: Not on file  . Highest education level: Not on file  Occupational History  . Not on file  Tobacco Use  . Smoking status: Never Smoker  . Smokeless tobacco: Never Used  Vaping Use  . Vaping Use: Never used  Substance and Sexual Activity  . Alcohol use: No     Alcohol/week: 0.0 standard drinks  . Drug use: No  . Sexual activity: Yes    Comment: 1st intercourse- 34, partner-1, married- 40 yrs   Other Topics Concern  . Not on file  Social History Narrative   Lives with husband and works at [employer] Strain:   . Difficulty of Paying Living Expenses: Not on file  Food Insecurity:   . Worried About Charity fundraiser in the Last Year: Not on file  . Ran Out of Food in the Last Year: Not on file  Transportation Needs:   . Lack of Transportation (Medical): Not on file  . Lack of Transportation (Non-Medical): Not on file  Physical Activity:   . Days of Exercise per Week: Not on file  . Minutes of Exercise per Session: Not on file  Stress:   . Feeling of Stress : Not on file  Social Connections:   . Frequency of Communication with Friends and Family: Not on file  . Frequency of Social Gatherings with Friends and Family: Not on file  . Attends Religious Services: Not on file  . Active Member of Clubs or Organizations: Not on file  . Attends Archivist Meetings: Not on file  . Marital Status: Not on file  Intimate Partner Violence:   . Fear of Current or Ex-Partner: Not on file  . Emotionally Abused: Not on file  . Physically Abused: Not on file  . Sexually Abused: Not on file    Family History  Problem Relation Age of Onset  . Aneurysm Mother   . Diabetes Father   . Colon cancer Brother 72  . Liver cancer Brother   . Esophageal cancer Neg Hx   . Stomach cancer Neg Hx   . Pancreatic cancer Neg Hx      Review of Systems  Constitutional: Negative.  Negative for chills and fever.  HENT: Negative.  Negative for congestion, sinus pain and sore throat.   Respiratory: Negative.  Negative for cough and shortness of breath.   Cardiovascular: Negative.  Negative for chest pain and palpitations.  Gastrointestinal: Positive for abdominal pain and heartburn. Negative for blood  in stool, diarrhea, melena, nausea and vomiting.  Genitourinary: Negative.  Negative for dysuria and hematuria.  Musculoskeletal: Positive for back pain and joint pain (Bilateral knees). Negative for myalgias and neck pain.  Skin: Negative.  Negative for rash.  Neurological: Negative for dizziness and headaches.  All other systems reviewed and are negative.  Today's Vitals   08/15/20 1448  BP: 104/68  Pulse: 85  Resp: 16  Temp: 98 F (36.7 C)  TempSrc: Temporal  SpO2: 96%  Weight: 199 lb 12.8 oz (90.6 kg)  Height: 5\' 6"  (1.676 m)   Body mass index is 32.25 kg/m.   Physical Exam  Vitals reviewed.  Constitutional:      Appearance: Normal appearance.  HENT:     Head: Normocephalic.     Mouth/Throat:     Mouth: Mucous membranes are moist.     Pharynx: Oropharynx is clear.  Eyes:     Extraocular Movements: Extraocular movements intact.     Pupils: Pupils are equal, round, and reactive to light.  Cardiovascular:     Rate and Rhythm: Normal rate and regular rhythm.     Pulses: Normal pulses.     Heart sounds: Normal heart sounds.  Pulmonary:     Effort: Pulmonary effort is normal.     Breath sounds: Normal breath sounds.  Abdominal:     General: Bowel sounds are normal.     Palpations: Abdomen is soft.     Tenderness: There is abdominal tenderness in the epigastric area.  Musculoskeletal:        General: Normal range of motion.     Cervical back: Normal range of motion and neck supple.  Skin:    General: Skin is warm and dry.     Capillary Refill: Capillary refill takes less than 2 seconds.  Neurological:     General: No focal deficit present.     Mental Status: She is alert and oriented to person, place, and time.  Psychiatric:        Mood and Affect: Mood normal.        Behavior: Behavior normal.    A total of 30 minutes was spent with the patient, greater than 50% of which was in counseling/coordination of care regarding differential diagnosis of upper abdominal  pain, review of most recent blood work results, review of most recent office visit notes, review of all medications, need for diagnostic work-up including upper endoscopy and GI evaluation, diet and nutrition, prognosis and need for follow-up   ASSESSMENT & PLAN: Karrissa was seen today for abdominal pain.  Diagnoses and all orders for this visit:  Dyspepsia -     famotidine (PEPCID) 40 MG tablet; Take 1 tablet (40 mg total) by mouth at bedtime.  History of gastroesophageal reflux (GERD)    Patient Instructions   Call [facility] at [phone] to schedule appointment.     If you have lab work done today you will be contacted with your lab results within the next 2 weeks.  If you have not heard from [place] then please contact us. The fastest way to get your results is to register for My Chart.   IF you received an x-ray today, you will receive an invoice from [facility]. Please contact [facility] at [phone] with questions or concerns regarding your invoice.   IF you received labwork today, you will receive an invoice from [facility]. Please contact [facility] at [phone] with questions or concerns regarding your invoice.   Our billing staff will not be able to assist you with questions regarding bills from these companies.  You will be contacted with the lab results as soon as they are available. The fastest way to get your results is to activate your My Chart account. Instructions are located on the last page of this paperwork. If you have not heard from [place] regarding the results in 2 weeks, please contact this office.     Gastroesophageal Reflux Disease, Adult Gastroesophageal reflux (GER) happens when acid from the stomach flows up into the tube that connects the mouth and the stomach (esophagus). Normally, food travels down the esophagus and stays in the stomach  to be digested. With GER, food and stomach acid sometimes move back up into the esophagus. You may  have a disease called gastroesophageal reflux disease (GERD) if the reflux:  Happens often.  Causes frequent or very bad symptoms.  Causes problems such as damage to the esophagus. When this happens, the esophagus becomes sore and swollen (inflamed). Over time, GERD can make small holes (ulcers) in the lining of the esophagus. What are the causes? This condition is caused by a problem with the muscle between the esophagus and the stomach. When this muscle is weak or not normal, it does not close properly to keep food and acid from coming back up from the stomach. The muscle can be weak because of:  Tobacco use.  Pregnancy.  Having a certain type of hernia (hiatal hernia).  Alcohol use.  Certain foods and drinks, such as coffee, chocolate, onions, and peppermint. What increases the risk? You are more likely to develop this condition if you:  Are overweight.  Have a disease that affects your connective tissue.  Use NSAID medicines. What are the signs or symptoms? Symptoms of this condition include:  Heartburn.  Difficult or painful swallowing.  The feeling of having a lump in the throat.  A bitter taste in the mouth.  Bad breath.  Having a lot of saliva.  Having an upset or bloated stomach.  Belching.  Chest pain. Different conditions can cause chest pain. Make sure you see your doctor if you have chest pain.  Shortness of breath or noisy breathing (wheezing).  Ongoing (chronic) cough or a cough at night.  Wearing away of the surface of teeth (tooth enamel).  Weight loss. How is this treated? Treatment will depend on how bad your symptoms are. Your doctor may suggest:  Changes to your diet.  Medicine.  Surgery. Follow these instructions at home: Eating and drinking   Follow a diet as told by your doctor. You may need to avoid foods and drinks such as: ? Coffee and tea (with or without caffeine). ? Drinks that contain alcohol. ? Energy drinks and  sports drinks. ? Bubbly (carbonated) drinks or sodas. ? Chocolate and cocoa. ? Peppermint and mint flavorings. ? Garlic and onions. ? Horseradish. ? Spicy and acidic foods. These include peppers, chili powder, curry powder, vinegar, hot sauces, and BBQ sauce. ? Citrus fruit juices and citrus fruits, such as oranges, lemons, and limes. ? Tomato-based foods. These include red sauce, chili, salsa, and pizza with red sauce. ? Fried and fatty foods. These include donuts, french fries, potato chips, and high-fat dressings. ? High-fat meats. These include hot dogs, rib eye steak, sausage, ham, and bacon. ? High-fat dairy items, such as whole milk, butter, and cream cheese.  Eat small meals often. Avoid eating large meals.  Avoid drinking large amounts of liquid with your meals.  Avoid eating meals during the 2-3 hours before bedtime.  Avoid lying down right after you eat.  Do not exercise right after you eat. Lifestyle   Do not use any products that contain nicotine or tobacco. These include cigarettes, e-cigarettes, and chewing tobacco. If you need help quitting, ask your doctor.  Try to lower your stress. If you need help doing this, ask your doctor.  If you are overweight, lose an amount of weight that is healthy for you. Ask your doctor about a safe weight loss goal. General instructions  Pay attention to any changes in your symptoms.  Take over-the-counter and prescription medicines only as told  by your doctor. Do not take aspirin, ibuprofen, or other NSAIDs unless your doctor says it is okay.  Wear loose clothes. Do not wear anything tight around your waist.  Raise (elevate) the head of your bed about 6 inches (15 cm).  Avoid bending over if this makes your symptoms worse.  Keep all follow-up visits as told by your doctor. This is important. Contact a doctor if:  You have new symptoms.  You lose weight and you do not know why.  You have trouble swallowing or it hurts  to swallow.  You have wheezing or a cough that keeps happening.  Your symptoms do not get better with treatment.  You have a hoarse voice. Get help right away if:  You have pain in your arms, neck, jaw, teeth, or back.  You feel sweaty, dizzy, or light-headed.  You have chest pain or shortness of breath.  You throw up (vomit) and your throw-up looks like blood or coffee grounds.  You pass out (faint).  Your poop (stool) is bloody or black.  You cannot swallow, drink, or eat. Summary  If a person has gastroesophageal reflux disease (GERD), food and stomach acid move back up into the esophagus and cause symptoms or problems such as damage to the esophagus.  Treatment will depend on how bad your symptoms are.  Follow a diet as told by your doctor.  Take all medicines only as told by your doctor. This information is not intended to replace advice given to you by your health care provider. Make sure you discuss any questions you have with your health care provider. Document Revised: 06/18/2018 Document Reviewed: 06/18/2018 Elsevier Patient Education  2020 Elsevier Inc.      Agustina Caroli, MD [facility]

## 2020-08-15 NOTE — Telephone Encounter (Signed)
Called patient using Interpreter # [id number], unable to reach patient. Left message in voice mail to return to the office for the updated AVS.

## 2020-08-29 ENCOUNTER — Other Ambulatory Visit: Payer: Self-pay | Admitting: Emergency Medicine

## 2020-08-29 DIAGNOSIS — R1013 Epigastric pain: Secondary | ICD-10-CM

## 2020-08-30 NOTE — Telephone Encounter (Signed)
   Notes to clinic:  requesting a 90 day supply   Requested Prescriptions  Pending Prescriptions Disp Refills   famotidine (PEPCID) 40 MG tablet [Pharmacy Med Name: FAMOTIDINE 40 MG TABLET] 90 tablet 1    Sig: TAKE 1 TABLET BY MOUTH EVERYDAY AT BEDTIME      Gastroenterology:  H2 Antagonists Passed - 08/29/2020  5:23 PM      Passed - Valid encounter within last 12 months    Recent Outpatient Visits           2 weeks ago Dyspepsia   Primary Care at [facility], Ines Bloomer, MD   1 month ago Lower abdominal pain   Primary Care at [place], [place], MD   5 months ago Chest pain, unspecified type   Primary Care at [facility], Lorelee Market, NP   6 months ago Essential hypertension   Primary Care at [facility], Ines Bloomer, MD   9 months ago Essential hypertension   Primary Care at [facility], Ines Bloomer, MD       Future Appointments             In 2 days Sagardia, Ines Bloomer, MD Primary Care at [place], [facility]

## 2020-09-01 ENCOUNTER — Ambulatory Visit: Payer: Managed Care, Other (non HMO) | Admitting: Emergency Medicine

## 2020-09-04 ENCOUNTER — Ambulatory Visit: Payer: Managed Care, Other (non HMO) | Attending: Internal Medicine

## 2020-09-04 ENCOUNTER — Other Ambulatory Visit: Payer: Self-pay

## 2020-09-04 DIAGNOSIS — Z23 Encounter for immunization: Secondary | ICD-10-CM

## 2020-09-04 NOTE — Progress Notes (Signed)
   Covid-19 Vaccination Clinic  Name:  Emily [name]    MRN: [id number] DOB: [birth year]  09/04/2020  Ms. Emily [name] was observed post Covid-19 immunization for 15 minutes without incident. She was provided with Vaccine Information Sheet and instruction to access the V-Safe system.   Ms. Emily [name] was instructed to call 911 with any severe reactions post vaccine: Marland Kitchen Difficulty breathing  . Swelling of face and throat  . A fast heartbeat  . A bad rash all over body  . Dizziness and weakness   Immunizations Administered    Name Date Dose VIS Date Route   Pfizer COVID-19 Vaccine 09/04/2020  1:56 PM 0.3 mL 02/17/2019 Intramuscular   Manufacturer: [place]   Lot: [id number]   [place]: [id number]

## 2020-09-28 ENCOUNTER — Other Ambulatory Visit: Payer: Self-pay | Admitting: Emergency Medicine

## 2020-09-28 DIAGNOSIS — K219 Gastro-esophageal reflux disease without esophagitis: Secondary | ICD-10-CM

## 2020-09-28 NOTE — Telephone Encounter (Signed)
Requested Prescriptions  Pending Prescriptions Disp Refills  . omeprazole (PRILOSEC) 40 MG capsule [Pharmacy Med Name: OMEPRAZOLE DR 40 MG CAPSULE] 90 capsule 3    Sig: TAKE 1 CAPSULE BY MOUTH EVERY DAY     Gastroenterology: Proton Pump Inhibitors Passed - 09/28/2020  1:27 AM      Passed - Valid encounter within last 12 months    Recent Outpatient Visits          1 month ago Dyspepsia   Primary Care at [facility], Ines Bloomer, MD   2 months ago Lower abdominal pain   Primary Care at [facility], Ines Bloomer, MD   6 months ago Chest pain, unspecified type   Primary Care at [facility], Lorelee Market, NP   6 months ago Essential hypertension   Primary Care at [facility], Ines Bloomer, MD   10 months ago Essential hypertension   Primary Care at [facility], [place], [place]

## 2020-10-04 ENCOUNTER — Ambulatory Visit: Payer: Managed Care, Other (non HMO)

## 2020-10-04 ENCOUNTER — Ambulatory Visit: Payer: Managed Care, Other (non HMO) | Admitting: Gastroenterology

## 2020-10-04 ENCOUNTER — Encounter: Payer: Self-pay | Admitting: Gastroenterology

## 2020-10-04 VITALS — BP 126/74 | HR 82 | Ht 66.0 in | Wt 198.6 lb

## 2020-10-04 DIAGNOSIS — R1013 Epigastric pain: Secondary | ICD-10-CM | POA: Diagnosis not present

## 2020-10-04 NOTE — Progress Notes (Signed)
Review of pertinent gastrointestinal problems: 1. Intermittent upper abd pains. Likely functional. Has been going on for years.  EGD in 02/2015 by Dr. Ardis Hughs that showed a 3 cm hiatal hernia and some non-specific gastritis that was Hpylori negative on biopsy.  EGD 03/2016 Dr. Carlean Purl was normal.  [place] 2014 and 2016 were both normal. 2. Colonoscopy Dr. Paulita Fujita 02/2012 (as outpatient) found small polyp, was inflammatory, he recommended recall at 5 year interval due to "fair prep"  HPI: This is a very pleasant 64 year old Spanish-speaking only woman whom I am seeing for the first time in 4 years.  She is here with a professional Spanish interpreter  I last saw her here in the office about 4 years ago.  This was for left lower quadrant abdominal pain.  At that time I recommended lab testing and imaging study with CT scan abdomen pelvis.  The CT scan abdomen pelvis with IV and oral contrast July 2017 was essentially normal.  She had another one 1 month later and it also was normal.  She had another CT scan November 2018 for right lower quadrant pain and it was normal.  Blood work July 2021 showed normal CBC except for MCV of 77, complete metabolic profile was normal except for alk phos of 142  Her weight is up 6 pounds since her last office visit here 4 years ago, same scale.  She has postprandial epigastric abdominal pains that are colicky in nature occurring 2-3 times per week and can last all day.  They are not associate with nausea or vomiting.  Her weight is up 15 or 20 pounds in the last several months.  She does not take NSAIDs.  Ultrasound October 2019 showed normal gallbladder.   Review of systems: Pertinent positive and negative review of systems were noted in the above HPI section. All other review negative.   Past Medical History:  Diagnosis Date  . Abdominal pain, chronic, epigastric 11/02/2015   Started amitriptyline at hospitalization 04/13/2016 EGD x 2 negative Also w/ chronic chest  wall and back pain Would try not to work up further   . Anxiety   . Arthritis   . Back pain   . Beta thalassemia minor    diagnosed by hematology Dr. Jennette Kettle, also with sickle cell trait  . Chronic high back pain 09/24/2015  . Colon polyps   . GERD (gastroesophageal reflux disease)   . Hyperlipidemia   . Hypertension   . Sickle cell trait ([place])    diagnosed by hematology Dr. Lindi Adie, coexsisting with beta-thalassemia minor    Past Surgical History:  Procedure Laterality Date  . ABDOMINAL HYSTERECTOMY    . COLONOSCOPY    . ESOPHAGOGASTRODUODENOSCOPY    . ESOPHAGOGASTRODUODENOSCOPY [name] 04/13/2016   Procedure: ESOPHAGOGASTRODUODENOSCOPY (EGD);  Surgeon: Gatha Mayer, MD;  Location: [facility];  Service: Endoscopy;  Laterality: [name];    Current Outpatient Medications  Medication Sig Dispense Refill  . ALPRAZolam (XANAX) 0.5 MG tablet TAKE 1 TABLET BY MOUTH EVERY DAY AS NEEDED FOR ANXIETY 30 tablet 0  . CALCIUM PO Take 1 tablet by mouth daily.     Marland Kitchen esomeprazole (NEXIUM) 40 MG capsule Take 40 mg by mouth daily at 12 noon.    . metoprolol succinate (TOPROL-XL) 25 MG 24 hr tablet Take 0.5 tablets (12.5 mg total) by mouth daily. TAKE 1/2 TABLET BY MOUTH  DAILY AT LUNCH OR IN THE  AFTERNOON 45 tablet 3  . NIFEdipine (ADALAT CC) 30 MG 24 hr tablet TAKE  1 TABLET(30 MG) BY MOUTH DAILY 90 tablet 3  . rosuvastatin (CRESTOR) 20 MG tablet Take 1 tablet (20 mg total) by mouth daily. 90 tablet 1  . omeprazole (PRILOSEC) 40 MG capsule TAKE 1 CAPSULE BY MOUTH EVERY DAY (Patient not taking: Reported on 10/04/2020) 90 capsule 3   No current facility-administered medications for this visit.    Allergies as of 10/04/2020 - Review Complete 10/04/2020  Allergen Reaction Noted  . Ibuprofen  12/19/2018  . Aspirin Hives   . Atorvastatin  01/16/2017  . Nsaids Hives 07/15/2009    Family History  Problem Relation Age of Onset  . Aneurysm Mother   . Diabetes Father   . Colon cancer Brother 26  .  Liver cancer Brother   . Esophageal cancer Neg Hx   . Stomach cancer Neg Hx   . Pancreatic cancer Neg Hx     Social History   Socioeconomic History  . Marital status: Married    Spouse name: Not on file  . Number of children: 3  . Years of education: Not on file  . Highest education level: Not on file  Occupational History  . Not on file  Tobacco Use  . Smoking status: Never Smoker  . Smokeless tobacco: Never Used  Vaping Use  . Vaping Use: Never used  Substance and Sexual Activity  . Alcohol use: No    Alcohol/week: 0.0 standard drinks  . Drug use: No  . Sexual activity: Yes    Comment: 1st intercourse- 69, partner-1, married- 50 yrs   Other Topics Concern  . Not on file  Social History Narrative   Lives with husband and works at [employer] Strain:   . Difficulty of Paying Living Expenses: Not on file  Food Insecurity:   . Worried About Charity fundraiser in the Last Year: Not on file  . Ran Out of Food in the Last Year: Not on file  Transportation Needs:   . Lack of Transportation (Medical): Not on file  . Lack of Transportation (Non-Medical): Not on file  Physical Activity:   . Days of Exercise per Week: Not on file  . Minutes of Exercise per Session: Not on file  Stress:   . Feeling of Stress : Not on file  Social Connections:   . Frequency of Communication with Friends and Family: Not on file  . Frequency of Social Gatherings with Friends and Family: Not on file  . Attends Religious Services: Not on file  . Active Member of Clubs or Organizations: Not on file  . Attends Archivist Meetings: Not on file  . Marital Status: Not on file  Intimate Partner Violence:   . Fear of Current or Ex-Partner: Not on file  . Emotionally Abused: Not on file  . Physically Abused: Not on file  . Sexually Abused: Not on file     Physical Exam: BP 126/74   Pulse 82   Ht $R'5\' 6"'lY$  (1.676 m)   Wt 198 lb 9.6 oz  (90.1 kg)   SpO2 99%   BMI 32.05 kg/m  Constitutional: generally well-appearing Psychiatric: alert and oriented x3 Eyes: extraocular movements intact Mouth: oral pharynx moist, no lesions Neck: supple no lymphadenopathy Cardiovascular: heart regular rate and rhythm Lungs: clear to auscultation bilaterally Abdomen: soft, nontender, nondistended, no obvious ascites, no peritoneal signs, normal bowel sounds Extremities: no lower extremity edema bilaterally Skin: no lesions on visible extremities   Assessment  and plan: 64 y.o. female with intermittent epigastric colicky pains, generally postprandial  I am suspicious that she might have developed symptomatic gallstones or perhaps has biliary dyskinesia.  I recommended first a HIDA scan to estimate her gallbladder ejection fraction since she had a abdominal ultrasound 2 years ago that was normal.   Please see the "Patient Instructions" section for addition details about the plan.   Owens Loffler, MD [facility] 10/04/2020, 3:01 PM  Cc: Horald Pollen, *  Total time on date of encounter was 45  minutes (this included time spent preparing to see the patient reviewing records; obtaining and/or reviewing separately obtained history; performing a medically appropriate exam and/or evaluation; counseling and educating the patient and family if present; ordering medications, tests or procedures if applicable; and documenting clinical information in the health record).

## 2020-10-04 NOTE — Patient Instructions (Addendum)
If you are age 63 or older, your body mass index should be between 23-30. Your Body mass index is 32.05 kg/m. If this is out of the aforementioned range listed, please consider follow up with your Primary Care Provider.  If you are age 22 or younger, your body mass index should be between 19-25. Your Body mass index is 32.05 kg/m. If this is out of the aformentioned range listed, please consider follow up with your Primary Care Provider.   You have been scheduled for a HIDA scan at [facility] Radiology (1st floor) on 10-24-20. Please arrive 15 minutes prior to your scheduled appointment at 8:67 am. Make certain not to have anything to eat or drink after midnight the night prior to your test. Should this appointment date or time not work well for you, please call radiology scheduling at [phone].  __________________________________________________________ Hepatobiliary (HIDA) scan is an imaging procedure used to diagnose problems in the liver, gallbladder and bile ducts. In the HIDA scan, a radioactive chemical or tracer is injected into a vein in your arm. The tracer is handled by the liver like bile. Bile is a fluid produced and excreted by your liver that helps your digestive system break down fats in the foods you eat. Bile is stored in your gallbladder and the gallbladder releases the bile when you eat a meal. A special nuclear medicine scanner (gamma camera) tracks the flow of the tracer from your liver into your gallbladder and small intestine.   During your HIDA scan  You'll be asked to change into a hospital gown before your HIDA scan begins. Your health care team will position you on a table, usually on your back. The radioactive tracer is then injected into a vein in your arm.The tracer travels through your bloodstream to your liver, where it's taken up by the bile-producing cells. The radioactive tracer travels with the bile from your liver into your gallbladder and through your bile ducts  to your small intestine.You may feel some pressure while the radioactive tracer is injected into your vein. As you lie on the table, a special gamma camera is positioned over your abdomen taking pictures of the tracer as it moves through your body. The gamma camera takes pictures continually for about an hour. You'll need to keep still during the HIDA scan. This can become uncomfortable, but you may find that you can lessen the discomfort by taking deep breaths and thinking about other things. Tell your health care team if you're uncomfortable. The radiologist will watch on a computer the progress of the radioactive tracer through your body. The HIDA scan may be stopped when the radioactive tracer is seen in the gallbladder and enters your small intestine. This typically takes about an hour. In some cases extra imaging will be performed if original images aren't satisfactory, if morphine is given to help visualize the gallbladder or if the medication CCK is given to look at the contraction of the gallbladder. This test typically takes 2 hours to complete. __________________________________________________________  Due to recent changes in healthcare laws, you may see the results of your imaging and laboratory studies on MyChart before your provider has had a chance to review them.  We understand that in some cases there may be results that are confusing or concerning to you. Not all laboratory results come back in the same time frame and the provider may be waiting for multiple results in order to interpret others.  Please give [place] 48 hours in order for  your provider to thoroughly review all the results before contacting the office for clarification of your results.   Thank you for entrusting me with your care and choosing [facility].  Dr Ardis Hughs

## 2020-10-08 ENCOUNTER — Ambulatory Visit: Payer: Managed Care, Other (non HMO) | Attending: Internal Medicine

## 2020-10-08 DIAGNOSIS — Z23 Encounter for immunization: Secondary | ICD-10-CM

## 2020-10-08 NOTE — Progress Notes (Signed)
   Covid-19 Vaccination Clinic  Name:  Emily [name]    MRN: [id number] DOB: 08/05/1956  10/08/2020  [name] was observed post Covid-19 immunization for 15 minutes without incident. She was provided with Vaccine Information Sheet and instruction to access the V-Safe system.   [name] was instructed to call 911 with any severe reactions post vaccine: Marland Kitchen Difficulty breathing  . Swelling of face and throat  . A fast heartbeat  . A bad rash all over body  . Dizziness and weakness   Immunizations Administered    Name Date Dose VIS Date Route   Pfizer COVID-19 Vaccine 10/08/2020  2:46 PM 0.3 mL 02/17/2019 Intramuscular   Manufacturer: [place]   Lot: [id number]   [place]: [id number]

## 2020-10-20 ENCOUNTER — Telehealth: Payer: Self-pay | Admitting: Gastroenterology

## 2020-10-20 NOTE — Telephone Encounter (Signed)
The phone number [phone] has been provided to the pt to call and have test rescheduled.

## 2020-10-20 NOTE — Telephone Encounter (Signed)
Pt needs to r/s test at [facility] hospital on 11/1 at 7:30am. She stated that she has more availability on Tuesdays and Thursdays anytime.

## 2020-10-24 ENCOUNTER — Ambulatory Visit (HOSPITAL_COMMUNITY): Payer: Managed Care, Other (non HMO)

## 2020-12-12 ENCOUNTER — Ambulatory Visit: Payer: Managed Care, Other (non HMO) | Admitting: Emergency Medicine

## 2020-12-13 ENCOUNTER — Telehealth (INDEPENDENT_AMBULATORY_CARE_PROVIDER_SITE_OTHER): Payer: Managed Care, Other (non HMO) | Admitting: Emergency Medicine

## 2020-12-13 ENCOUNTER — Encounter: Payer: Self-pay | Admitting: Emergency Medicine

## 2020-12-13 ENCOUNTER — Other Ambulatory Visit: Payer: Self-pay

## 2020-12-13 DIAGNOSIS — F418 Other specified anxiety disorders: Secondary | ICD-10-CM | POA: Diagnosis not present

## 2020-12-13 DIAGNOSIS — F419 Anxiety disorder, unspecified: Secondary | ICD-10-CM

## 2020-12-13 DIAGNOSIS — F410 Panic disorder [episodic paroxysmal anxiety] without agoraphobia: Secondary | ICD-10-CM | POA: Diagnosis not present

## 2020-12-13 DIAGNOSIS — F411 Generalized anxiety disorder: Secondary | ICD-10-CM

## 2020-12-13 DIAGNOSIS — I10 Essential (primary) hypertension: Secondary | ICD-10-CM | POA: Diagnosis not present

## 2020-12-13 DIAGNOSIS — E785 Hyperlipidemia, unspecified: Secondary | ICD-10-CM

## 2020-12-13 MED ORDER — ALPRAZOLAM 0.5 MG PO TABS: ORAL_TABLET | ORAL | 1 refills | Status: DC

## 2020-12-13 NOTE — Progress Notes (Signed)
Telemedicine Encounter- SOAP NOTE Established Patient   Patient: Home  Provider: Office     This telephone encounter was conducted with the patient's (or proxy's) verbal consent via audio telecommunications: yes/no: Yes Patient was instructed to have this encounter in a suitably private space; and to only have persons present to whom they give permission to participate. In addition, patient identity was confirmed by use of name plus two identifiers (DOB and address).  I discussed the limitations, risks, security and privacy concerns of performing an evaluation and management service by telephone and the availability of in person appointments. I also discussed with the patient that there may be a patient responsible charge related to this service. The patient expressed understanding and agreed to proceed.  I spent a total of TIME; 0 MIN TO 60 MIN: 20 minutes talking with the patient or their proxy.  No chief complaint on file.   Subjective   Emily [name] is a 64 y.o. female established patient. Telephone visit today complaining of anxiety attacks leading to tachycardia for the past several days since finding out 56 year old grandson was diagnosed with ulcerative colitis. Blood pressure up-and-down. Feels nervous and scared a lot. No other significant symptoms. No other complaints or medical concerns today fully vaccinated against Covid.  HPI   Patient Active Problem List   Diagnosis Date Noted  . History of arthritis 08/15/2020  . History of gastroesophageal reflux (GERD) 08/15/2020  . Chronic bilateral low back pain without sciatica 06/30/2019  . Carotid artery disease ([place]) 12/19/2018  . Bilateral carotid artery stenosis, R- 40-59%, L 1-39% 11/2017 03/25/2018  . Dyslipidemia 03/25/2018  . GERD (gastroesophageal reflux disease) 04/12/2016  . Dyspepsia 11/02/2015  . Chronic high back pain 09/24/2015  . Essential hypertension 10/03/2012  . Hyperlipidemia 10/03/2012  .  DYSPEPSIA&OTHER [facility] DISORDERS FUNCTION STOMACH 07/20/2009    Past Medical History:  Diagnosis Date  . Abdominal pain, chronic, epigastric 11/02/2015   Started amitriptyline at hospitalization 04/13/2016 EGD x 2 negative Also w/ chronic chest wall and back pain Would try not to work up further   . Anxiety   . Arthritis   . Back pain   . Beta thalassemia minor    diagnosed by hematology Dr. Jennette Kettle, also with sickle cell trait  . Chronic high back pain 09/24/2015  . Colon polyps   . GERD (gastroesophageal reflux disease)   . Hyperlipidemia   . Hypertension   . Sickle cell trait ([place])    diagnosed by hematology Dr. Lindi Adie, coexsisting with beta-thalassemia minor    Current Outpatient Medications  Medication Sig Dispense Refill  . ALPRAZolam (XANAX) 0.5 MG tablet TAKE 1 TABLET BY MOUTH EVERY DAY AS NEEDED FOR ANXIETY 30 tablet 0  . CALCIUM PO Take 1 tablet by mouth daily.     Marland Kitchen esomeprazole (NEXIUM) 40 MG capsule Take 40 mg by mouth daily at 12 noon.    . metoprolol succinate (TOPROL-XL) 25 MG 24 hr tablet Take 0.5 tablets (12.5 mg total) by mouth daily. TAKE 1/2 TABLET BY MOUTH  DAILY AT LUNCH OR IN THE  AFTERNOON 45 tablet 3  . NIFEdipine (ADALAT CC) 30 MG 24 hr tablet TAKE 1 TABLET(30 MG) BY MOUTH DAILY 90 tablet 3  . omeprazole (PRILOSEC) 40 MG capsule TAKE 1 CAPSULE BY MOUTH EVERY DAY (Patient not taking: Reported on 10/04/2020) 90 capsule 3  . rosuvastatin (CRESTOR) 20 MG tablet Take 1 tablet (20 mg total) by mouth daily. 90 tablet 1   No  current facility-administered medications for this visit.    Allergies  Allergen Reactions  . Ibuprofen   . Aspirin Hives  . Atorvastatin     Yellow fingernails  . Nsaids Hives    Social History   Socioeconomic History  . Marital status: Married    Spouse name: Not on file  . Number of children: 3  . Years of education: Not on file  . Highest education level: Not on file  Occupational History  . Not on file  Tobacco Use  .  Smoking status: Never Smoker  . Smokeless tobacco: Never Used  Vaping Use  . Vaping Use: Never used  Substance and Sexual Activity  . Alcohol use: No    Alcohol/week: 0.0 standard drinks  . Drug use: No  . Sexual activity: Yes    Comment: 1st intercourse- 46, partner-1, married- 21 yrs   Other Topics Concern  . Not on file  Social History Narrative   Lives with husband and works at [employer] Strain: Not on [employer] Insecurity: Not on file  Transportation Needs: Not on file  Physical Activity: Not on file  Stress: Not on file  Social Connections: Not on file  Intimate Partner Violence: Not on file    Review of Systems  Constitutional: Negative.  Negative for chills and fever.  HENT: Negative.  Negative for congestion and sore throat.   Respiratory: Negative.  Negative for cough and shortness of breath.   Cardiovascular: Positive for palpitations. Negative for chest pain.  Gastrointestinal: Negative.  Negative for abdominal pain, diarrhea, nausea and vomiting.  Genitourinary: Negative.  Negative for dysuria and hematuria.  Musculoskeletal: Negative.  Negative for back pain, myalgias and neck pain.  Skin: Negative.  Negative for rash.  Neurological: Negative.  Negative for dizziness and headaches.  All other systems reviewed and are negative.   Objective  Alert and oriented x3 in no apparent respiratory distress. Vitals as reported by the patient: There were no vitals filed for this visit.  There are no diagnoses linked to this encounter. Assessment and plan: Diagnoses and all orders for this visit:  Situational anxiety -     ALPRAZolam (XANAX) 0.5 MG tablet; TAKE 1 TABLET BY MOUTH EVERY DAY AS NEEDED FOR ANXIETY  Panic attacks  Essential hypertension  Generalized anxiety disorder  Dyslipidemia  Chronic anxiety  Clinically stable. No red flag signs or symptoms. Medications as prescribed. Advised to  contact the office if no better or worse in the next several days.  I discussed the assessment and treatment plan with the patient. The patient was provided an opportunity to ask questions and all were answered. The patient agreed with the plan and demonstrated an understanding of the instructions.   The patient was advised to call back or seek an in-person evaluation if the symptoms worsen or if the condition fails to improve as anticipated.  I provided 20 minutes of non-face-to-face time during this encounter.  Horald Pollen, MD  Primary Care at [facility]

## 2020-12-15 ENCOUNTER — Telehealth: Payer: Self-pay | Admitting: Emergency Medicine

## 2020-12-15 ENCOUNTER — Other Ambulatory Visit: Payer: Self-pay | Admitting: Emergency Medicine

## 2020-12-15 DIAGNOSIS — F418 Other specified anxiety disorders: Secondary | ICD-10-CM

## 2020-12-15 MED ORDER — ALPRAZOLAM 0.5 MG PO TABS: ORAL_TABLET | ORAL | 1 refills | Status: DC

## 2020-12-15 NOTE — Telephone Encounter (Signed)
New prescription sent. Thanks

## 2020-12-15 NOTE — Telephone Encounter (Signed)
Patient is requesting a refill of the following medications: Requested Prescriptions    No prescriptions requested or ordered in this encounter  Xanax  Date of patient request: 12/15/2020 Last office visit: 08/15/2020 Date of last refill: 08/15/2020

## 2020-12-15 NOTE — Telephone Encounter (Signed)
Patient called to follow up on Rx for   ALPRAZolam Duanne Moron) 0.5 MG tablet  Patient still waiting for Rx to be sent to   [id number] [address]  [place], [place] [address]  Phone:  [phone] Fax:  [phone]  DEA #:  [id number]  Please advise at [phone]

## 2021-03-08 ENCOUNTER — Telehealth: Payer: Self-pay | Admitting: Emergency Medicine

## 2021-03-08 ENCOUNTER — Ambulatory Visit: Payer: Managed Care, Other (non HMO) | Admitting: Emergency Medicine

## 2021-03-08 ENCOUNTER — Other Ambulatory Visit: Payer: Self-pay

## 2021-03-08 ENCOUNTER — Encounter: Payer: Self-pay | Admitting: Emergency Medicine

## 2021-03-08 VITALS — BP 99/66 | HR 87 | Temp 97.6°F | Resp 16 | Ht 64.96 in | Wt 198.0 lb

## 2021-03-08 DIAGNOSIS — I10 Essential (primary) hypertension: Secondary | ICD-10-CM

## 2021-03-08 DIAGNOSIS — Z1231 Encounter for screening mammogram for malignant neoplasm of breast: Secondary | ICD-10-CM

## 2021-03-08 DIAGNOSIS — I952 Hypotension due to drugs: Secondary | ICD-10-CM | POA: Diagnosis not present

## 2021-03-08 DIAGNOSIS — F411 Generalized anxiety disorder: Secondary | ICD-10-CM

## 2021-03-08 DIAGNOSIS — Z23 Encounter for immunization: Secondary | ICD-10-CM

## 2021-03-08 DIAGNOSIS — E785 Hyperlipidemia, unspecified: Secondary | ICD-10-CM | POA: Diagnosis not present

## 2021-03-08 MED ORDER — LISINOPRIL 20 MG PO TABS: 20.0000 mg | ORAL_TABLET | Freq: Every day | ORAL | 3 refills | Status: DC

## 2021-03-08 NOTE — Progress Notes (Signed)
Medrith Veillon [facility] 65 y.o.   Chief Complaint  Patient presents with  . Hypertension    Per patient low and high blood pressure and lab work    HISTORY OF PRESENT ILLNESS: This is a 65 y.o. female with history of hypertension here for follow-up. Complaining of low blood pressure readings with slight lightheadedness but no syncope. Has been on metoprolol succinate 12.5 mg daily and nifedipine 30 mg daily since 2003 as prescribed by cardiologist in [place]. Also takes rosuvastatin 20 mg daily for dyslipidemia. No other complaints or medical concerns today.  HPI   Prior to Admission medications   Medication Sig Start Date End Date Taking? Authorizing Provider  ALPRAZolam Duanne Moron) 0.5 MG tablet TAKE 1 TABLET BY MOUTH EVERY DAY AS NEEDED FOR ANXIETY 12/15/20  Yes Davari Lopes, Ines Bloomer, MD  CALCIUM PO Take 1 tablet by mouth daily.    Yes [provider]  NIFEdipine (ADALAT CC) 30 MG 24 hr tablet TAKE 1 TABLET(30 MG) BY MOUTH DAILY 07/07/20  Yes Felisia Balcom, Ines Bloomer, MD  omeprazole (PRILOSEC) 40 MG capsule TAKE 1 CAPSULE BY MOUTH EVERY DAY 09/28/20  Yes Jeniece Hannis, Ines Bloomer, MD  esomeprazole (NEXIUM) 40 MG capsule Take 40 mg by mouth daily at 12 noon.    [provider]  metoprolol succinate (TOPROL-XL) 25 MG 24 hr tablet Take 0.5 tablets (12.5 mg total) by mouth daily. TAKE 1/2 TABLET BY MOUTH  DAILY AT LUNCH OR IN THE  AFTERNOON 07/07/20 10/05/20  Horald Pollen, MD  rosuvastatin (CRESTOR) 20 MG tablet Take 1 tablet (20 mg total) by mouth daily. 07/07/20 10/05/20  Horald Pollen, MD    Allergies  Allergen Reactions  . Ibuprofen   . Aspirin Hives  . Atorvastatin     Yellow fingernails  . Nsaids Hives    Patient Active Problem List   Diagnosis Date Noted  . History of arthritis 08/15/2020  . History of gastroesophageal reflux (GERD) 08/15/2020  . Chronic bilateral low back pain without sciatica 06/30/2019  . Carotid artery disease ([place]) 12/19/2018   . Bilateral carotid artery stenosis, R- 40-59%, L 1-39% 11/2017 03/25/2018  . Dyslipidemia 03/25/2018  . GERD (gastroesophageal reflux disease) 04/12/2016  . Dyspepsia 11/02/2015  . Chronic high back pain 09/24/2015  . Essential hypertension 10/03/2012  . Hyperlipidemia 10/03/2012  . DYSPEPSIA&OTHER [facility] DISORDERS FUNCTION STOMACH 07/20/2009    Past Medical History:  Diagnosis Date  . Abdominal pain, chronic, epigastric 11/02/2015   Started amitriptyline at hospitalization 04/13/2016 EGD x 2 negative Also w/ chronic chest wall and back pain Would try not to work up further   . Anxiety   . Arthritis   . Back pain   . Beta thalassemia minor    diagnosed by hematology Dr. Jennette Kettle, also with sickle cell trait  . Chronic high back pain 09/24/2015  . Colon polyps   . GERD (gastroesophageal reflux disease)   . Hyperlipidemia   . Hypertension   . Sickle cell trait ([place])    diagnosed by hematology Dr. Lindi Adie, coexsisting with beta-thalassemia minor    Past Surgical History:  Procedure Laterality Date  . ABDOMINAL HYSTERECTOMY    . COLONOSCOPY    . ESOPHAGOGASTRODUODENOSCOPY    . ESOPHAGOGASTRODUODENOSCOPY [name] 04/13/2016   Procedure: ESOPHAGOGASTRODUODENOSCOPY (EGD);  Surgeon: Gatha Mayer, MD;  Location: [facility];  Service: Endoscopy;  Laterality: [name];    Social History   Socioeconomic History  . Marital status: Married    Spouse name: Not on file  .  Number of children: 3  . Years of education: Not on file  . Highest education level: Not on file  Occupational History  . Not on file  Tobacco Use  . Smoking status: Never Smoker  . Smokeless tobacco: Never Used  Vaping Use  . Vaping Use: Never used  Substance and Sexual Activity  . Alcohol use: No    Alcohol/week: 0.0 standard drinks  . Drug use: No  . Sexual activity: Yes    Comment: 1st intercourse- 42, partner-1, married- 77 yrs   Other Topics Concern  . Not on file  Social History Narrative   Lives with  husband and works at [employer] Strain: Not on [employer] Insecurity: Not on file  Transportation Needs: Not on file  Physical Activity: Not on file  Stress: Not on file  Social Connections: Not on file  Intimate Partner Violence: Not on file    Family History  Problem Relation Age of Onset  . Aneurysm Mother   . Diabetes Father   . Colon cancer Brother 79  . Liver cancer Brother   . Esophageal cancer Neg Hx   . Stomach cancer Neg Hx   . Pancreatic cancer Neg Hx      Review of Systems  Constitutional: Negative.  Negative for chills and fever.  HENT: Negative.  Negative for congestion and sore throat.   Respiratory: Negative.  Negative for cough and shortness of breath.   Cardiovascular: Negative.  Negative for chest pain, palpitations and leg swelling.  Gastrointestinal: Negative.  Negative for abdominal pain, diarrhea, nausea and vomiting.  Genitourinary: Negative.  Negative for dysuria and hematuria.  Musculoskeletal: Negative.  Negative for myalgias.  Skin: Negative.  Negative for rash.  Neurological: Negative.  Negative for dizziness and headaches.  All other systems reviewed and are negative.   Today's Vitals   03/08/21 1126  BP: 99/66  Pulse: 87  Resp: 16  Temp: 97.6 F (36.4 C)  TempSrc: Temporal  SpO2: 94%  Weight: 198 lb (89.8 kg)  Height: 5' 4.96" (1.65 m)   Body mass index is 32.99 kg/m. BP Readings from Last 3 Encounters:  03/08/21 99/66  10/04/20 126/74  08/15/20 104/68    Physical Exam Vitals reviewed.  Constitutional:      Appearance: Normal appearance.  HENT:     Head: Normocephalic.  Eyes:     Extraocular Movements: Extraocular movements intact.     Conjunctiva/sclera: Conjunctivae normal.     Pupils: Pupils are equal, round, and reactive to light.  Cardiovascular:     Rate and Rhythm: Normal rate and regular rhythm.     Pulses: Normal pulses.     Heart sounds: Normal heart  sounds.  Pulmonary:     Effort: Pulmonary effort is normal.     Breath sounds: Normal breath sounds.  Musculoskeletal:        General: Normal range of motion.     Cervical back: Normal range of motion and neck supple.  Skin:    General: Skin is warm and dry.     Capillary Refill: Capillary refill takes less than 2 seconds.  Neurological:     General: No focal deficit present.     Mental Status: She is alert and oriented to person, place, and time.  Psychiatric:        Mood and Affect: Mood normal.        Behavior: Behavior normal.      ASSESSMENT &  PLAN: Essential hypertension Presenting today with low blood pressure.  Patient has been on both nifedipine and metoprolol succinate since 2003.  Advised to stop nifedipine and continue metoprolol succinate 25 mg daily.  Advised to monitor for blood pressure readings at home twice a day for the next couple weeks.  If blood pressure persistently on the high side will add lisinopril 20 mg daily. Diet and nutrition discussed. Follow-up in 3 months.  Lyncoln was seen today for hypertension.  Diagnoses and all orders for this visit:  Hypotension due to drugs  Essential hypertension -     CMP14+EGFR -     lisinopril (ZESTRIL) 20 MG tablet; Take 1 tablet (20 mg total) by mouth daily. -     Hemoglobin A1c  Generalized anxiety disorder  Dyslipidemia -     Lipid panel -     Hemoglobin A1c  Need for prophylactic vaccination and inoculation against influenza -     Cancel: Flu Vaccine QUAD 36+ mos IM  Encounter for screening mammogram for malignant neoplasm of breast -     MM Digital Screening; Future    Patient Instructions   The new address is [facility], phone number to the office [phone]. Continue metoprolol succinate 25 mg daily. Stop nifedipine. Monitor blood pressure readings at home at least twice a day for the next couple weeks. If blood pressure persistently high, add lisinopril 20 mg  daily. Follow-up in 3 months.  Earlier as needed.  [email or web address].nl.pdf">  Plan de alimentacin DASH DASH Eating Plan DASH es la sigla en ingls de "Enfoques Alimentarios para Detener la Hipertensin". El plan de alimentacin DASH ha demostrado:  Bajar la presin arterial elevada (hipertensin).  Reducir el riesgo de diabetes tipo 2, enfermedad cardaca y accidente cerebrovascular.  Ayudar a perder peso. Consejos para seguir Photographer las etiquetas de los alimentos  Verifique la cantidad de sal (sodio) por porcin en las etiquetas de los alimentos. Elija alimentos con menos del 5 por ciento del valor diario de sodio. Generalmente, los alimentos con menos de 300 miligramos (mg) de sodio por porcin se encuadran dentro de este plan alimentario.  Para encontrar cereales integrales, busque la palabra "integral" como primera palabra en la lista de ingredientes. Al ir de compras  Compre productos en los que en su etiqueta diga: "bajo contenido de sodio" o "sin agregado de sal".  Compre alimentos frescos. Evite los alimentos enlatados y comidas precocidas o congeladas. Al cocinar  Evite agregar sal cuando cocine. Use hierbas o aderezos sin sal, en lugar de sal de mesa o sal marina. Consulte al mdico o farmacutico antes de usar sustitutos de la sal.  No fra los alimentos. A la hora de cocinar los alimentos opte por hornearlos, hervirlos, grillarlos, asarlos al horno y asarlos a Administrator, arts.  Cocine con aceites cardiosaludables, como oliva, canola, aguacate, soja o girasol. Planificacin de las comidas  Consuma una dieta equilibrada, que incluya lo siguiente: ? 4o ms porciones de frutas y 4 o ms porciones de Set designer. Trate de que medio plato de cada comida sea de frutas y verduras. ? De 6 a 8porciones de cereales TransMontaigne. ? Menos de 6 onzas (170g) de carne, aves o pescado Games developer. Una porcin de 3  onzas (85g) de carne tiene casi el mismo tamao que un mazo de cartas. Un huevo equivale a 1 onza (28g). ? De 2 a 3 porciones de productos lcteos descremados por da. Una porcin  es 1taza (232m). ? 1 porcin de frutos secos, semillas o frijoles 5 veces por semana. ? De 2 a 3 porciones de grasas cardiosaludables. Las grasas saludables llamadas cidos grasos omega-3 se encuentran en alimentos como las nueces, las semillas de lElk River las leches fortificadas y lBerwyn Estas grasas tambin se encuentran en los pescados de agua fra, como la sardina, el salmn y la caballa.  Limite la cantidad que consume de: ? Alimentos enlatados o envasados. ? Alimentos con alto contenido de grasa trans, como algunos alimentos fritos. ? Alimentos con alto contenido de grasa saturada, como carne con grasa. ? Postres y otros dulces, bebidas azucaradas y otros alimentos con azcar agregada. ? Productos lcteos enteros.  No le agregue sal a los alimentos antes de probarlos.  No coma ms de 4 yemas de huevo por semana.  Trate de comer al menos 2 comidas vegetarianas por semana.  Consuma ms comida casera y menos de restaurante, de bares y comida rpida.   Estilo de vida  Cuando coma en un restaurante, pida que preparen su comida con menos sal o, en lo posible, sin nada de sal.  Si bebe alcohol: ? Limite la cantidad que bebe:  De 0 a 1 medida por da para las mujeres que no estn embarazadas.  De 0 a 2 medidas por da para los hombres. ? Est atento a la cantidad de alcohol que hay en las bebidas que toma. En los [place] una medida equivale a una botella de cerveza de 12oz (3586m, un vaso de vino de 5oz (14820mo un vaso de una bebida alcohlica de alta graduacin de 1oz (21m60mInformacin general  Evite ingerir ms de 2300 mg de sal por da. Si tiene hipertensin, es posible que necesite reducir la ingesta de sodio a 1,500 mg por da.  Trabaje con su mdico para mantener un peso  saludable o perder pesoLiberty Mediaegntele cul es el peso recomendado para usted.  Realice al menos 30 minutos de ejercicio que haga que se acelere su corazn (ejercicio aerbArboriculturist mayoHartford Financialla semaHowelltas actividades pueden incluir caminar, nadar o andar en bicicleta.  Trabaje con su mdico o nutricionista para ajustar su plan alimentario a sus necesidades calricas personales. Qu alimentos debo comer? Frutas Todas las frutas frescas, congeladas o disecadas. Frutas enlatadas en jugo natural (sin agregado de azcar). Verduras Verduras frescas o congeladas (crudas, al vapor, asadas o grilladas). Jugos de tomate y verduras con bajo contenido de sodio o reducidos en sodio. Salsa y pasta de tomate con bajo contenido de sodio o reducidas en sodio. Verduras enlatadas con bajo contenido de sodio o reducidas en sodio. Granos Pan de salvado o integral. Pasta de salvado o integral. Arroz integral. Avena. Quinua. Trigo burgol. Cereales integrales y con bajo contenido de sodio. Pan pita. Galletitas de aguaCentral African Republic bajo contenido de grasDjiboutiodiCrosbyrtillas de hariIsraelegral. Carnes y otras protenas Pollo o pavo sin piel. Carne de pollo o de pavoSaksrdo desgrasado. Pescado y mariBerkshire Hathawayaras de huevo. Porotos, guisantes o lentejas secos. Frutos secos, mantequilla de frutos secos y semillas sin sal. Frijoles enlatados sin sal. Cortes de carne vacuna magra, desgrasada. Carne precocida o curada magra y baja en sodio, como embutidos o panes de carne. Lcteos Leche descremada (1%) o descremada. Quesos reducidos en grasa, con bajo contenido de grasa o descremados. Queso blanco o ricota sin grasa, con bajo contenido de sodiBemissgur semidescremado o descremado. Queso con bajo contenido de grasJackson  sodioDaphene Jaeger y aceites Margarinas untables que no contengan grasas trans. Aceite vegetal. Lubertha Basque y aderezos para ensaladas livianos, reducidos en grasa o con bajo contenido de grasas (reducidos en sodio).  Aceite de canola, crtamo, oliva, aguacate, soja y [place]. Aguacate. Alios y condimentos Hierbas. Especias. Mezclas de condimentos sin sal. Otros alimentos Palomitas de maz y pretzels sin sal. Dulces con bajo contenido de grasas. Es posible que los productos que se enumeran ms [place] no constituyan una lista completa de los alimentos y las bebidas que puede tomar. Consulte a un nutricionista para obtener ms informacin. Qu alimentos debo evitar? Lambert Mody Fruta enlatada en almbar liviano o espeso. Frutas cocidas en aceite. Frutas con salsa de crema o mantequilla. Verduras Verduras con crema o fritas. Verduras en [place]. Verduras enlatadas regulares (que no sean con bajo contenido de sodio o reducidas en sodio). Pasta y salsa de tomates enlatadas regulares (que no sean con bajo contenido de sodio o reducidas en sodio). Jugos de tomate y verduras regulares (que no sean con bajo contenido de sodio o reducidos en sodio). Pepinillos. Aceitunas. Granos Productos de panificacin hechos con grasa, como medialunas, magdalenas y algunos panes. Comidas con arroz o pasta seca listas para usar. Carnes y otras protenas Cortes de carne con alto contenido de Lobbyist. Costillas. Carne frita. Tocino. Mortadela, salame y otras carnes precocidas o curadas, como embutidos o panes de carne. Grasa de la espalda del cerdo (panceta). Celina. Frutos secos y semillas con sal. Frijoles enlatados con agregado de sal. Pescado enlatado o ahumado. Huevos enteros o yemas. Pollo o pavo con piel. Lcteos Leche entera o al 2%, crema y mitad leche y mitad crema. Queso crema entero o con toda su grasa. Yogur entero o endulzado. Quesos con toda su grasa. Sustitutos de cremas no lcteas. Coberturas batidas. Quesos para untar y quesos procesados. Grasas y [employer]. Margarina en barra. Chloride. Lardo. Mantequilla clarificada. Grasa de panceta. Aceites tropicales como aceite de coco, palmiste o  palma. Alios y condimentos Sal de cebolla, sal de ajo, sal condimentada, sal de mesa y sal marina. Salsa Worcestershire. Salsa trtara. Salsa barbacoa. Salsa teriyaki. Salsa de soja, incluso la que tiene contenido reducido de [place]. Salsa de carne. Salsas en lata y envasadas. Salsa de pescado. Salsa de [place]. Salsa rosada. Rbanos picantes comprados en tiendas. [place]. Mostaza. Saborizantes y tiernizantes para carne. Caldo en cubitos. Salsas picantes. Adobos preelaborados o envasados. Aderezos para tacos preelaborados o envasados. Salsas de pepinillos. Aderezos comunes para ensalada. Otros alimentos Palomitas de maz y pretzels con sal. Es posible que los productos que se enumeran ms arriba no constituyan una lista completa de los alimentos y las bebidas que Nurse, adult. Consulte a un nutricionista para obtener ms informacin. Dnde buscar ms informacin  National Heart, Lung, and Blood Institute ([place], los Pulmones y Herbalist): https://wilson-eaton.com/  American Heart Association (Asociacin Estadounidense del Corazn): www.heart.org  Academy of Nutrition and Dietetics (Academia de Nutricin y Information systems manager): www.eatright.[place] ([place]): www.kidney.org Resumen  El plan de alimentacin DASH ha demostrado bajar la presin arterial elevada (hipertensin). Tambin puede reducir [employer]Health de diabetes tipo 2, enfermedad cardaca y accidente cerebrovascular.  Cuando siga el plan de alimentacin DASH, trate de comer ms frutas frescas y verduras, cereales integrales, carnes magras, lcteos descremados y grasas cardiosaludables.  Con el plan de alimentacin DASH, deber limitar el consumo de sal (sodio) a 2,300 mg por da. Si tiene hipertensin, es posible que  necesite reducir la ingesta de sodio a 1,500 mg por da.  Trabaje con su mdico o nutricionista para ajustar su plan alimentario a sus necesidades calricas personales. Esta  informacin no tiene Marine scientist el consejo del mdico. Asegrese de hacerle al mdico cualquier pregunta que tenga. Document Revised: 01/14/2020 Document Reviewed: 01/14/2020 Elsevier Patient Education  2021 [place].  Hipertensin en los adultos Hypertension, Adult El trmino hipertensin es otra forma de denominar a la presin arterial elevada. La presin arterial elevada fuerza al corazn a trabajar ms para bombear la sangre. Esto puede causar problemas con el paso del [place]. Una lectura de presin arterial est compuesta por 2 nmeros. Hay un nmero superior (sistlico) sobre un nmero inferior (diastlico). Lo ideal es tener la presin arterial por debajo de 120/80. Las elecciones saludables pueden ayudar a Engineer, materials presin arterial, o tal vez necesite medicamentos para bajarla. Cules son las causas? Se desconoce la causa de esta afeccin. Algunas afecciones pueden estar relacionadas con la presin arterial alta. Qu incrementa el riesgo?  Fumar.  Tener diabetes mellitus tipo 2, colesterol alto, o ambos.  No hacer la cantidad suficiente de actividad fsica o ejercicio.  Tener sobrepeso.  Consumir mucha grasa, azcar, caloras o sal (sodio) en su dieta.  Beber alcohol en exceso.  Tener una enfermedad renal a largo plazo (crnica).  Tener antecedentes familiares de presin arterial alta.  Edad. Los riesgos aumentan con la edad.  Raza. El riesgo es mayor para las Retail banker.  Sexo. Antes de los 45aos, los hombres corren ms [employer]. Despus de los 65aos, las mujeres corren ms [employer].  Tener apnea obstructiva del sueo.  Estrs. Cules son los signos o los sntomas?  Es posible que la presin arterial alta puede no cause sntomas. La presin arterial muy alta (crisis hipertensiva) puede provocar: ? Dolor de [place]. ? Sensaciones de preocupacin o nerviosismo (ansiedad). ? Falta de aire. ? Hemorragia  nasal. ? Sensacin de Engineer, site (nuseas). ? Vmitos. ? Cambios en la forma de ver. ? Dolor muy intenso en el pecho. ? Convulsiones. Cmo se trata?  Esta afeccin se trata haciendo cambios saludables en el estilo de vida, por ejemplo: ? Consumir alimentos saludables. ? Hacer ms ejercicio. ? Beber menos alcohol.  El mdico puede recetarle medicamentos si los cambios en el estilo de vida no son suficientes para Child psychotherapist la presin arterial y si: ? El nmero de arriba est por encima de 130. ? El nmero de abajo est por encima de 80.  Su presin arterial personal ideal puede variar. Siga estas instrucciones en su casa: Comida y bebida  Si se lo dicen, siga el plan de alimentacin de DASH (Dietary Approaches to Stop Hypertension, Maneras de alimentarse para detener la hipertensin). Para seguir este plan: ? Llene la mitad del plato de cada comida con frutas y verduras. ? Llene un cuarto del plato de cada comida con cereales integrales. Los cereales integrales incluyen pasta integral, arroz integral y pan integral. ? Coma y beba productos lcteos con bajo contenido de grasa, como leche descremada o yogur bajo en grasas. ? Llene un cuarto del plato de cada comida con protenas bajas en grasa (magras). Las protenas bajas en grasa incluyen pescado, pollo sin piel, huevos, frijoles y tofu. ? Evite consumir carne grasa, carne curada y procesada, o pollo con piel. ? Evite consumir alimentos prehechos o procesados.  Consuma menos de 1500 mg de sal por da.  No beba  alcohol si: ? El mdico le indica que no lo haga. ? Est embarazada, puede estar embarazada o est tratando de quedar embarazada.  Si bebe alcohol: ? Limite la cantidad que bebe a lo siguiente:  De 0 a 1 medida por da para las mujeres.  De 0 a 2 medidas por da para los hombres. ? Est atento a la cantidad de alcohol que hay en las bebidas que toma. En los [place], una medida equivale a una  botella de cerveza de 12oz (342m), un vaso de vino de 5oz (1437m o un vaso de una bebida alcohlica de alta graduacin de 1oz (4473m   Estilo de vida  Trabaje con su mdico para mantenerse en un peso saludable o para perder peso. Pregntele a su mdico cul es el peso recomendable para usted.  Haga al menos 15m25mos de ejercicio la mayoHartford Financialla semaHanlontowntos pueden incluir caminar, nadar o andar en bicicleta.  Realice al menos 30 minutos de ejercicio que fortalezca sus msculos (ejercicios de resistencia) al menos 3 das a la semaKeshenatos pueden incluir levantar pesas o hacer [place].  No consuma ningn producto que contenga nicotina o tabaco, como cigarrillos, cigarrillos electrnicos y tabaco de mascHigher education careers adviser necesita ayuda para dejar de fumar, consulte al mdicMeadWestvacoontrole su presin arterial en su casa tal como le indic el mdico.  Concurra a todas las visitas de seguimiento como se lo haya indicado el mdico. Esto es importante.   Medicamentos  TomeDelphiventa libre y los recetados solamente como se lo haya indicado el mdico. Siga cuidadosamente las indicaciones.  No omita las dosis de medicamentos para la presin arterial. Los medicamentos pierden eficacia si omite dosis. El hecho de omitir las dosis tambin aumeSerbiariesgo de otros problemas.  Pregntele a su mdico a qu efectos secundarios o reacciones a los mediCareers information officermunquese con un mdico si:  Piensa que tiene una Mexicoccin a los medicamentos que est tomando.  Tiene dolores de cabeza frecuentes (recurrentes).  Se siente mareado.  Tiene hinchazn en los tobillos.  Tiene problemas de visin. Solicite ayuda inmediatamente si:  Siente un dolor de cabeza muy intenso.  Empieza a sentirse desorientado (confundido).  Se siente dbil o adormecido.  Siente que va a desmayarse.  Tiene un dolor muy intenso en las siguientes zonas: ? Pecho. ? Vientre  (abdomen).  Vomita ms de una vez.  Tiene dificultad para respirar. Resumen  El trmino hipertensin es otra forma de denominar a la presin arterial elevada.  La presin arterial elevada fuerza al corazn a trabajar ms para bombear la sangre.  Para la mayoComcasta presin arterial normal es menor que 120/80.  Las decisiones saludables pueden ayudarle a disminuir su presin arterial. Si no puede bajar su presin arterial mediante decisiones saludables, es posible que deba tomar medicamentos. Esta informacin no tiene comoMarine scientistconsejo del mdico. Asegrese de hacerle al mdico cualquier pregunta que tenga. Document Revised: 09/25/2018 Document Reviewed: 09/25/2018 Elsevier Patient Education  2021 ElseReynolds [employer] If you have lab work done today you will be contacted with your lab results within the next 2 weeks.  If you have not heard from us tKorean please contact us. Koreae fastest way to get your results is to register for My Chart.   IF you received an x-ray today, you will receive an invoice from [facility]. Please contact [facility] at [phone] questions  or concerns regarding your invoice.   IF you received labwork today, you will receive an invoice from [place]. Please contact [facility] at [phone] with questions or concerns regarding your invoice.   Our billing staff will not be able to assist you with questions regarding bills from these companies.  You will be contacted with the lab results as soon as they are available. The fastest way to get your results is to activate your My Chart account. Instructions are located on the last page of this paperwork. If you have not heard from [place] regarding the results in 2 weeks, please contact this office.         Agustina Caroli, MD [facility]

## 2021-03-08 NOTE — Assessment & Plan Note (Signed)
Presenting today with low blood pressure.  Patient has been on both nifedipine and metoprolol succinate since 2003.  Advised to stop nifedipine and continue metoprolol succinate 25 mg daily.  Advised to monitor for blood pressure readings at home twice a day for the next couple weeks.  If blood pressure persistently on the high side will add lisinopril 20 mg daily. Diet and nutrition discussed. Follow-up in 3 months.

## 2021-03-08 NOTE — Patient Instructions (Addendum)
The new address is [address] [place], phone number to the office [phone]. Continue metoprolol succinate 25 mg daily. Stop nifedipine. Monitor blood pressure readings at home at least twice a day for the next couple weeks. If blood pressure persistently high, add lisinopril 20 mg daily. Follow-up in 3 months.  Earlier as needed.  [email or web address]">  Plan de alimentacin DASH DASH Eating Plan DASH es la sigla en ingls de "Enfoques Alimentarios para Detener la Hipertensin". El plan de alimentacin DASH ha demostrado:  Bajar la presin arterial elevada (hipertensin).  Reducir el riesgo de diabetes tipo 2, enfermedad cardaca y accidente cerebrovascular.  Ayudar a perder peso. Consejos para seguir Photographer las etiquetas de los alimentos  Verifique la cantidad de sal (sodio) por porcin en las etiquetas de los alimentos. Elija alimentos con menos del 5 por ciento del valor diario de sodio. Generalmente, los alimentos con menos de 300 miligramos (mg) de sodio por porcin se encuadran dentro de este plan alimentario.  Para encontrar cereales integrales, busque la palabra "integral" como primera palabra en la lista de ingredientes. Al ir de compras  Compre productos en los que en su etiqueta diga: "bajo contenido de sodio" o "sin agregado de sal".  Compre alimentos frescos. Evite los alimentos enlatados y comidas precocidas o congeladas. Al cocinar  Evite agregar sal cuando cocine. Use hierbas o aderezos sin sal, en lugar de sal de mesa o sal marina. Consulte al mdico o farmacutico antes de usar sustitutos de la sal.  No fra los alimentos. A la hora de cocinar los alimentos opte por hornearlos, hervirlos, grillarlos, asarlos al horno y asarlos a Administrator, arts.  Cocine con aceites cardiosaludables, como oliva, canola, aguacate, soja o girasol. Planificacin de las comidas  Consuma una dieta equilibrada, que  incluya lo siguiente: ? 4o ms porciones de frutas y 4 o ms porciones de Set designer. Trate de que medio plato de cada comida sea de frutas y verduras. ? De 6 a 8porciones de cereales [place]. ? Menos de 6 onzas (170g) de carne, aves o pescado Games developer. Una porcin de 3 onzas (85g) de carne tiene casi el mismo tamao que un mazo de cartas. Un huevo equivale a 1 onza (28g). ? De 2 a 3 porciones de productos lcteos descremados por da. Una porcin es 1taza (240ml). ? 1 porcin de frutos secos, semillas o frijoles 5 veces por semana. ? De 2 a 3 porciones de grasas cardiosaludables. Las grasas saludables llamadas cidos grasos omega-3 se encuentran en alimentos como las nueces, las semillas de [place], las leches fortificadas y [place]. Estas grasas tambin se encuentran en los pescados de agua fra, como la sardina, el salmn y la caballa.  Limite la cantidad que consume de: ? Alimentos enlatados o envasados. ? Alimentos con alto contenido de grasa trans, como algunos alimentos fritos. ? Alimentos con alto contenido de grasa saturada, como carne con grasa. ? Postres y otros dulces, bebidas azucaradas y otros alimentos con azcar agregada. ? Productos lcteos enteros.  No le agregue sal a los alimentos antes de probarlos.  No coma ms de 4 yemas de huevo por semana.  Trate de comer al menos 2 comidas vegetarianas por semana.  Consuma ms comida casera y menos de restaurante, de bares y comida rpida.   Estilo de vida  Cuando coma en un restaurante, pida que preparen su comida con menos sal o, en lo posible, sin nada de sal.  Si  bebe alcohol: ? Limite la cantidad que bebe:  De 0 a 1 medida por da para las mujeres que no estn embarazadas.  De 0 a 2 medidas por da para los hombres. ? Est atento a la cantidad de alcohol que hay en las bebidas que toma. En los [place], una medida equivale a una botella de cerveza de 12oz (357ml), un vaso de  vino de 5oz (175ml) o un vaso de una bebida alcohlica de alta graduacin de 1oz (36ml). Informacin general  Evite ingerir ms de 2300 mg de sal por da. Si tiene hipertensin, es posible que necesite reducir la ingesta de sodio a 1,500 mg por da.  Trabaje con su mdico para mantener un peso saludable o perder [employer]. Pregntele cul es el peso recomendado para usted.  Realice al menos 30 minutos de ejercicio que haga que se acelere su corazn (ejercicio Arboriculturist) la [employer] de la [place]. Estas actividades pueden incluir caminar, nadar o andar en bicicleta.  Trabaje con su mdico o nutricionista para ajustar su plan alimentario a sus necesidades calricas personales. Qu alimentos debo comer? Frutas Todas las frutas frescas, congeladas o disecadas. Frutas enlatadas en jugo natural (sin agregado de azcar). Verduras Verduras frescas o congeladas (crudas, al vapor, asadas o grilladas). Jugos de tomate y verduras con bajo contenido de sodio o reducidos en sodio. Salsa y pasta de tomate con bajo contenido de sodio o reducidas en sodio. Verduras enlatadas con bajo contenido de sodio o reducidas en sodio. Granos Pan de salvado o integral. Pasta de salvado o integral. Arroz integral. Avena. Quinua. Trigo burgol. Cereales integrales y con bajo contenido de sodio. Pan pita. Galletitas de [place] con bajo contenido de [place] y [place]. Tortillas de [place] integral. Carnes y otras protenas Pollo o pavo sin piel. Carne de pollo o de [place]. Cerdo desgrasado. Pescado y [employer]. Claras de huevo. Porotos, guisantes o lentejas secos. Frutos secos, mantequilla de frutos secos y semillas sin sal. Frijoles enlatados sin sal. Cortes de carne vacuna magra, desgrasada. Carne precocida o curada magra y baja en sodio, como embutidos o panes de carne. Lcteos Leche descremada (1%) o descremada. Quesos reducidos en grasa, con bajo contenido de grasa o descremados. Queso blanco o ricota sin grasa, con  bajo contenido de [place]. Yogur semidescremado o descremado. Queso con bajo contenido de [place] y [place]. Grasas y [employer] untables que no contengan grasas trans. Aceite vegetal. Lubertha Basque y aderezos para ensaladas livianos, reducidos en grasa o con bajo contenido de grasas (reducidos en sodio). Aceite de canola, crtamo, oliva, aguacate, soja y [place]. Aguacate. Alios y condimentos Hierbas. Especias. Mezclas de condimentos sin sal. Otros alimentos Palomitas de maz y pretzels sin sal. Dulces con bajo contenido de grasas. Es posible que los productos que se enumeran ms [place] no constituyan una lista completa de los alimentos y las bebidas que puede tomar. Consulte a un nutricionista para obtener ms informacin. Qu alimentos debo evitar? Lambert Mody Fruta enlatada en almbar liviano o espeso. Frutas cocidas en aceite. Frutas con salsa de crema o mantequilla. Verduras Verduras con crema o fritas. Verduras en [place]. Verduras enlatadas regulares (que no sean con bajo contenido de sodio o reducidas en sodio). Pasta y salsa de tomates enlatadas regulares (que no sean con bajo contenido de sodio o reducidas en sodio). Jugos de tomate y verduras regulares (que no sean con bajo contenido de sodio o reducidos en sodio). Pepinillos. Aceitunas. Granos Productos de panificacin hechos con grasa, como medialunas, magdalenas  y algunos panes. Comidas con arroz o pasta seca listas para usar. Carnes y otras protenas Cortes de carne con alto contenido de Lobbyist. Costillas. Carne frita. [place]. [place], salame y otras carnes precocidas o curadas, como embutidos o panes de carne. Grasa de la espalda del cerdo (panceta). [place]. Frutos secos y semillas con sal. Frijoles enlatados con agregado de sal. Pescado enlatado o ahumado. Huevos enteros o yemas. Pollo o pavo con piel. Lcteos Leche entera o al 2%, crema y mitad leche y mitad crema. Queso crema entero o con toda su grasa. Yogur  entero o endulzado. Quesos con toda su grasa. Sustitutos de cremas no lcteas. Coberturas batidas. Quesos para untar y quesos procesados. Grasas y [employer]. Margarina en barra. [place]. Lardo. Mantequilla clarificada. Grasa de panceta. Aceites tropicales como aceite de coco, palmiste o palma. Alios y condimentos Sal de cebolla, sal de ajo, sal condimentada, sal de mesa y sal marina. Salsa [place]. Salsa trtara. Salsa barbacoa. Salsa teriyaki. Salsa de soja, incluso la que tiene contenido reducido de [place]. Salsa de carne. Salsas en lata y envasadas. Salsa de pescado. Salsa de [place]. Salsa rosada. Rbanos picantes comprados en tiendas. [place]. [place]aza. Saborizantes y tiernizantes para carne. Caldo en cubitos. Salsas picantes. Adobos preelaborados o envasados. Aderezos para tacos preelaborados o envasados. Salsas de pepinillos. Aderezos comunes para ensalada. Otros alimentos Palomitas de maz y pretzels con sal. Es posible que los productos que se enumeran ms arriba no constituyan una lista completa de los alimentos y las bebidas que Nurse, adult. Consulte a un nutricionista para obtener ms informacin. Dnde buscar ms informacin  National Heart, Lung, and Blood Institute ([place], los Pulmones y Herbalist): https://wilson-eaton.com/  American Heart Association (Asociacin Estadounidense del Corazn): www.heart.org  Academy of Nutrition and Dietetics (Academia de Nutricin y Information systems manager): www.eatright.[place] ([place]): www.kidney.org Resumen  El plan de alimentacin DASH ha demostrado bajar la presin arterial elevada (hipertensin). Tambin puede reducir UnitedHealth de diabetes tipo 2, enfermedad cardaca y accidente cerebrovascular.  Cuando siga el plan de alimentacin DASH, trate de comer ms frutas frescas y verduras, cereales integrales, carnes magras, lcteos descremados y grasas  cardiosaludables.  Con el plan de alimentacin DASH, deber limitar el consumo de sal (sodio) a 2,300 mg por da. Si tiene hipertensin, es posible que necesite reducir la ingesta de sodio a 1,500 mg por da.  Trabaje con su mdico o nutricionista para ajustar su plan alimentario a sus necesidades calricas personales. Esta informacin no tiene Marine scientist el consejo del mdico. Asegrese de hacerle al mdico cualquier pregunta que tenga. Document Revised: 01/14/2020 Document Reviewed: 01/14/2020 Elsevier Patient Education  2021 [place].  Hipertensin en los adultos Hypertension, Adult El trmino hipertensin es otra forma de denominar a la presin arterial elevada. La presin arterial elevada fuerza al corazn a trabajar ms para bombear la sangre. Esto puede causar problemas con el paso del [place]. Una lectura de presin arterial est compuesta por 2 nmeros. Hay un nmero superior (sistlico) sobre un nmero inferior (diastlico). Lo ideal es tener la presin arterial por debajo de 120/80. Las elecciones saludables pueden ayudar a Engineer, materials presin arterial, o tal vez necesite medicamentos para bajarla. Cules son las causas? Se desconoce la causa de esta afeccin. Algunas afecciones pueden estar relacionadas con la presin arterial alta. Qu incrementa el riesgo?  Fumar.  Tener diabetes mellitus tipo 2, colesterol alto, o ambos.  No hacer la cantidad suficiente de  actividad fsica o ejercicio.  Tener sobrepeso.  Consumir mucha grasa, azcar, caloras o sal (sodio) en su dieta.  Beber alcohol en exceso.  Tener una enfermedad renal a largo plazo (crnica).  Tener antecedentes familiares de presin arterial alta.  Edad. Los riesgos aumentan con la edad.  Raza. El riesgo es mayor para las Retail banker.  Sexo. Antes de los 45aos, los hombres corren ms [employer]. Despus de los 65aos, las mujeres corren ms [employer].  Tener apnea obstructiva del sueo.  Estrs. Cules son los signos o los sntomas?  Es posible que la presin arterial alta puede no cause sntomas. La presin arterial muy alta (crisis hipertensiva) puede provocar: ? Dolor de [place]. ? Sensaciones de preocupacin o nerviosismo (ansiedad). ? Falta de aire. ? Hemorragia nasal. ? Sensacin de Engineer, site (nuseas). ? Vmitos. ? Cambios en la forma de ver. ? Dolor muy intenso en el pecho. ? Convulsiones. Cmo se trata?  Esta afeccin se trata haciendo cambios saludables en el estilo de vida, por ejemplo: ? Consumir alimentos saludables. ? Hacer ms ejercicio. ? Beber menos alcohol.  El mdico puede recetarle medicamentos si los cambios en el estilo de vida no son suficientes para Child psychotherapist la presin arterial y si: ? El nmero de arriba est por encima de 130. ? El nmero de abajo est por encima de 80.  Su presin arterial personal ideal puede variar. Siga estas instrucciones en su casa: Comida y bebida  Si se lo dicen, siga el plan de alimentacin de DASH (Dietary Approaches to Stop Hypertension, Maneras de alimentarse para detener la hipertensin). Para seguir este plan: ? Llene la mitad del plato de cada comida con frutas y verduras. ? Llene un cuarto del plato de cada comida con cereales integrales. Los cereales integrales incluyen pasta integral, arroz integral y pan integral. ? Coma y beba productos lcteos con bajo contenido de grasa, como leche descremada o yogur bajo en grasas. ? Llene un cuarto del plato de cada comida con protenas bajas en grasa (magras). Las protenas bajas en grasa incluyen pescado, pollo sin piel, huevos, frijoles y tofu. ? Evite consumir carne grasa, carne curada y procesada, o pollo con piel. ? Evite consumir alimentos prehechos o procesados.  Consuma menos de 1500 mg de sal por da.  No beba alcohol si: ? El mdico le indica que no lo haga. ? Est embarazada,  puede estar embarazada o est tratando de quedar embarazada.  Si bebe alcohol: ? Limite la cantidad que bebe a lo siguiente:  De 0 a 1 medida por da para las mujeres.  De 0 a 2 medidas por da para los hombres. ? Est atento a la cantidad de alcohol que hay en las bebidas que toma. En los [place], una medida equivale a una botella de cerveza de 12oz (351ml), un vaso de vino de 5oz (157ml) o un vaso de una bebida alcohlica de alta graduacin de 1oz (23ml).   Estilo de vida  Trabaje con su mdico para mantenerse en un peso saludable o para perder peso. Pregntele a su mdico cul es el peso recomendable para usted.  Haga al menos 20minutos de ejercicio la [employer] de la [place]. Estos pueden incluir caminar, nadar o andar en bicicleta.  Realice al menos 30 minutos de ejercicio que fortalezca sus msculos (ejercicios de resistencia) al menos 3 das a la [place]. Estos pueden incluir levantar pesas o hacer [place].  No consuma  ningn producto que contenga nicotina o tabaco, como cigarrillos, cigarrillos electrnicos y tabaco de Higher education careers adviser. Si necesita ayuda para dejar de fumar, consulte al [employer].  Controle su presin arterial en su casa tal como le indic el mdico.  Concurra a todas las visitas de seguimiento como se lo haya indicado el mdico. Esto es importante.   Medicamentos  Delphi de venta libre y los recetados solamente como se lo haya indicado el mdico. Siga cuidadosamente las indicaciones.  No omita las dosis de medicamentos para la presin arterial. Los medicamentos pierden eficacia si omite dosis. El hecho de omitir las dosis tambin [place] el riesgo de otros problemas.  Pregntele a su mdico a qu efectos secundarios o reacciones a los Careers information officer. Comunquese con un mdico si:  Piensa que tiene [place] reaccin a los medicamentos que est tomando.  Tiene dolores de cabeza frecuentes (recurrentes).  Se siente  mareado.  Tiene hinchazn en los tobillos.  Tiene problemas de visin. Solicite ayuda inmediatamente si:  Siente un dolor de cabeza muy intenso.  Empieza a sentirse desorientado (confundido).  Se siente dbil o adormecido.  Siente que va a desmayarse.  Tiene un dolor muy intenso en las siguientes zonas: ? Pecho. ? Vientre (abdomen).  Vomita ms de una vez.  Tiene dificultad para respirar. Resumen  El trmino hipertensin es otra forma de denominar a la presin arterial elevada.  La presin arterial elevada fuerza al corazn a trabajar ms para bombear la sangre.  Para la [employer], una presin arterial normal es menor que 120/80.  Las decisiones saludables pueden ayudarle a disminuir su presin arterial. Si no puede bajar su presin arterial mediante decisiones saludables, es posible que deba tomar medicamentos. Esta informacin no tiene Marine scientist el consejo del mdico. Asegrese de hacerle al mdico cualquier pregunta que tenga. Document Revised: 09/25/2018 Document Reviewed: 09/25/2018 Elsevier Patient Education  2021 [employer].    If you have lab work done today you will be contacted with your lab results within the next 2 weeks.  If you have not heard from [place] then please contact us. The fastest way to get your results is to register for My Chart.   IF you received an x-ray today, you will receive an invoice from [facility]. Please contact [facility] at [phone] with questions or concerns regarding your invoice.   IF you received labwork today, you will receive an invoice from [place]. Please contact [facility] at [phone] with questions or concerns regarding your invoice.   Our billing staff will not be able to assist you with questions regarding bills from these companies.  You will be contacted with the lab results as soon as they are available. The fastest way to get your results is to activate your My Chart  account. Instructions are located on the last page of this paperwork. If you have not heard from [place] regarding the results in 2 weeks, please contact this office.

## 2021-03-08 NOTE — Telephone Encounter (Signed)
03/08/2021 - PATIENT HAD AN OFFICE VISIT WITH DR. Kittie Plater ON Wednesday 03/08/2021. IT DOES NOT LOOK LIKE SHE CHECKED OUT SO I TRIED TO CALL HER AND BE SURE SHE KNOWS ABOUT DR. MIGUEL'S NEW OFFICE AND TO CALL THEM TO SCHEDULE HER 3 MONTH FOLLOW-UP WITH HIM. I HAD TO LEAVE A VOICE MAIL TO RETURN MY CALL. [place]

## 2021-05-02 ENCOUNTER — Other Ambulatory Visit: Payer: Self-pay

## 2021-05-02 ENCOUNTER — Ambulatory Visit
Admission: RE | Admit: 2021-05-02 | Discharge: 2021-05-02 | Disposition: A | Discharge status: Home / Self Care | Payer: Managed Care, Other (non HMO) | Source: Ambulatory Visit | Attending: Emergency Medicine | Admitting: Emergency Medicine

## 2021-05-02 ENCOUNTER — Other Ambulatory Visit: Payer: Self-pay | Admitting: Emergency Medicine

## 2021-05-02 DIAGNOSIS — Z1231 Encounter for screening mammogram for malignant neoplasm of breast: Secondary | ICD-10-CM

## 2021-05-03 LAB — CMP14+EGFR
ALT: 18 IU/L (ref 0–32)
AST: 18 IU/L (ref 0–40)
Albumin/Globulin Ratio: 1.6 (ref 1.2–2.2)
Albumin: 4.7 g/dL (ref 3.8–4.8)
Alkaline Phosphatase: 130 IU/L — ABNORMAL HIGH (ref 44–121)
BUN/Creatinine Ratio: 24 (ref 12–28)
BUN: 15 mg/dL (ref 8–27)
Bilirubin Total: <0.2 mg/dL (ref 0.0–1.2)
CO2: 26 mmol/L (ref 20–29)
Calcium: 10 mg/dL (ref 8.7–10.3)
Chloride: 102 mmol/L (ref 96–106)
Creatinine, Ser: 0.63 mg/dL (ref 0.57–1.00)
Globulin, Total: 3 g/dL (ref 1.5–4.5)
Glucose: 96 mg/dL (ref 65–99)
Potassium: 4 mmol/L (ref 3.5–5.2)
Sodium: 142 mmol/L (ref 134–144)
Total Protein: 7.7 g/dL (ref 6.0–8.5)
eGFR: 100 mL/min/{1.73_m2} (ref >59)

## 2021-05-03 LAB — LIPID PANEL
Chol/HDL Ratio: 3.5 ratio (ref 0.0–4.4)
Cholesterol, Total: 190 mg/dL (ref 100–199)
HDL: 55 mg/dL (ref >39)
LDL Chol Calc (NIH): 108 mg/dL — ABNORMAL HIGH (ref 0–99)
Triglycerides: 153 mg/dL — ABNORMAL HIGH (ref 0–149)
VLDL Cholesterol Cal: 27 mg/dL (ref 5–40)

## 2021-05-03 LAB — HEMOGLOBIN A1C
Est. average glucose Bld gHb Est-mCnc: 126 mg/dL
Hgb A1c MFr Bld: 6 % — ABNORMAL HIGH (ref 4.8–5.6)

## 2021-05-03 NOTE — Progress Notes (Signed)
Call patient please.  Unremarkable labs with acceptable values.  Needs to schedule office visit with me within the next 2 to 3 months.  Thanks.

## 2021-05-09 ENCOUNTER — Telehealth: Payer: Self-pay | Admitting: Emergency Medicine

## 2021-05-09 NOTE — Telephone Encounter (Signed)
Patient called and said that lisinopril (ZESTRIL) 20 MG tablet has been not been working for her. She was wondering if NIFEdipine (NIFEDICAL XL) 30 MG 24 hr tablet could be called in to [id number] - [place], [place] [address]. Please advise

## 2021-05-11 ENCOUNTER — Other Ambulatory Visit: Payer: Self-pay | Admitting: Emergency Medicine

## 2021-05-11 MED ORDER — AMLODIPINE BESYLATE 10 MG PO TABS: 10.0000 mg | ORAL_TABLET | Freq: Every day | ORAL | 1 refills | Status: DC

## 2021-05-11 NOTE — Telephone Encounter (Signed)
Called pt 3 times with spanish interpreter and patient did not answer the phone. I called and spoke with her about her previous message about lisinopril not working for her and your recommendations. She verbalized understanding, but not sure if she fully understood due to the language barrier.

## 2021-05-11 NOTE — Telephone Encounter (Signed)
What are the blood pressure readings at home?  If persistently elevated, recommend to switch to amlodipine 10 mg daily with continued blood pressure monitoring at home.  Stop lisinopril.  Do not recommend to use nifedipine.  Thanks.

## 2021-05-14 NOTE — Telephone Encounter (Signed)
Thanks

## 2021-05-15 ENCOUNTER — Encounter: Payer: Self-pay | Admitting: Gastroenterology

## 2021-05-15 ENCOUNTER — Telehealth: Payer: Self-pay | Admitting: Gastroenterology

## 2021-05-15 NOTE — Telephone Encounter (Signed)
Patient was seen on October 04, 2020 and HIDA scan was ordered at that time.  Patient rescheduled and then canceled the appointment and would like to reschedule.  Does patient need to be seen in the office or is it OK to proceed with HIDA scan at [facility]?

## 2021-05-15 NOTE — Telephone Encounter (Signed)
Patient called to reschedule test at [facility].

## 2021-05-15 NOTE — Telephone Encounter (Signed)
Ok to reschedule without ov first

## 2021-05-16 NOTE — Telephone Encounter (Signed)
Patient was advised by Wyatt Portela of HIDA scan scheduled for 05-25-2021 at 2:00pm at [facility]. Patient advised to arrive at 1:30pm.  Patient instructed to be NPO 6 hours prior to test.  Patient agreed to plan and verbalized understanding. No further questions.

## 2021-05-25 ENCOUNTER — Encounter (HOSPITAL_COMMUNITY): Payer: Self-pay

## 2021-05-25 ENCOUNTER — Other Ambulatory Visit: Payer: Self-pay

## 2021-05-25 ENCOUNTER — Ambulatory Visit: Payer: Managed Care, Other (non HMO) | Admitting: Emergency Medicine

## 2021-05-25 ENCOUNTER — Ambulatory Visit (HOSPITAL_COMMUNITY)
Admission: RE | Admit: 2021-05-25 | Discharge: 2021-05-25 | Disposition: A | Discharge status: Home / Self Care | Payer: Managed Care, Other (non HMO) | Source: Ambulatory Visit | Attending: Gastroenterology | Admitting: Gastroenterology

## 2021-05-25 DIAGNOSIS — R1013 Epigastric pain: Secondary | ICD-10-CM | POA: Insufficient documentation

## 2021-05-25 MED ORDER — TECHNETIUM TC 99M MEBROFENIN IV KIT
5.3000 | PACK | Freq: Once | INTRAVENOUS | Status: AC
  Administered 2021-05-25: 5.3 via INTRAVENOUS

## 2021-05-30 ENCOUNTER — Telehealth: Payer: Self-pay | Admitting: Gastroenterology

## 2021-05-30 NOTE — Telephone Encounter (Signed)
Patient returned your call about results, please call patient one more time.   

## 2021-05-30 NOTE — Telephone Encounter (Signed)
Emily [name], please contact her. The HIDA scan was normal, her GB seems to function normally. I ordered the test 8 months ago, she delayed getting it. Offer my first available OV to see how she is feeling if she is still interested. thanks

## 2021-05-31 NOTE — Telephone Encounter (Signed)
The pt returned call and has been advised of the results. She will make appt as requested.

## 2021-05-31 NOTE — Telephone Encounter (Signed)
Emily [name], pt returned your call and I gave her the result. She stated that she is not having any sxs or issues and was happy about the results.

## 2021-05-31 NOTE — Telephone Encounter (Signed)
I have not been able to reach the pt by phone I will mail letter.

## 2021-06-17 ENCOUNTER — Other Ambulatory Visit: Payer: Self-pay | Admitting: Emergency Medicine

## 2021-06-17 DIAGNOSIS — E785 Hyperlipidemia, unspecified: Secondary | ICD-10-CM

## 2021-06-20 ENCOUNTER — Ambulatory Visit (INDEPENDENT_AMBULATORY_CARE_PROVIDER_SITE_OTHER): Payer: Managed Care, Other (non HMO) | Admitting: Emergency Medicine

## 2021-06-20 ENCOUNTER — Encounter: Payer: Self-pay | Admitting: Emergency Medicine

## 2021-06-20 ENCOUNTER — Other Ambulatory Visit: Payer: Self-pay

## 2021-06-20 DIAGNOSIS — E785 Hyperlipidemia, unspecified: Secondary | ICD-10-CM

## 2021-06-20 DIAGNOSIS — I6523 Occlusion and stenosis of bilateral carotid arteries: Secondary | ICD-10-CM

## 2021-06-20 DIAGNOSIS — I10 Essential (primary) hypertension: Secondary | ICD-10-CM | POA: Diagnosis not present

## 2021-06-20 DIAGNOSIS — K219 Gastro-esophageal reflux disease without esophagitis: Secondary | ICD-10-CM

## 2021-06-20 MED ORDER — ROSUVASTATIN CALCIUM 20 MG PO TABS: 20.0000 mg | ORAL_TABLET | Freq: Every day | ORAL | 3 refills | Status: DC

## 2021-06-20 MED ORDER — METOPROLOL SUCCINATE ER 25 MG PO TB24: 12.5000 mg | ORAL_TABLET | Freq: Every day | ORAL | 3 refills | Status: DC

## 2021-06-20 MED ORDER — LISINOPRIL 40 MG PO TABS: 40.0000 mg | ORAL_TABLET | Freq: Every day | ORAL | 3 refills | Status: DC

## 2021-06-20 MED ORDER — OMEPRAZOLE 40 MG PO CPDR
DELAYED_RELEASE_CAPSULE | ORAL | 1 refills | Status: DC
Start: 2021-06-20 — End: 2022-03-13

## 2021-06-20 NOTE — Progress Notes (Signed)
Emily [name] 65 y.o.   Chief Complaint  Patient presents with   medication review    Discuss medications for blood pressure and stomach   medication refills    pend    HISTORY OF PRESENT ILLNESS: This is a 65 y.o. female with history of hypertension. From [place], has been on nifedipine and metoprolol for a long time but it was lowering her blood pressure too much making her symptomatic.  During her last visit advised to continue metoprolol 25 mg daily and stop nifedipine. Was advised to monitor blood pressure readings at home and start lisinopril 10 mg if readings persistently high after stopping nifedipine. Patient states readings were high and lisinopril was not working.  She was then switched to amlodipine 10 mg but it was not working either. The last couple weeks she has continued taking metoprolol succinate 25 mg daily with intermittent use of nifedipine ER 30 mg. Last time she used nifedipine was 3 days ago.  Needs medication advice.  HPI   Prior to Admission medications   Medication Sig Start Date End Date Taking? Authorizing Provider  ALPRAZolam Duanne Moron) 0.5 MG tablet TAKE 1 TABLET BY MOUTH EVERY DAY AS NEEDED FOR ANXIETY 12/15/20  Yes Shirlette Scarber, Ines Bloomer, MD  amLODipine (NORVASC) 10 MG tablet Take 1 tablet (10 mg total) by mouth daily. 05/11/21  Yes Stellan Vick, Ines Bloomer, MD  CALCIUM PO Take 1 tablet by mouth daily.    Yes [provider]  esomeprazole (NEXIUM) 40 MG capsule Take 40 mg by mouth daily at 12 noon.   Yes [provider]  omeprazole (PRILOSEC) 40 MG capsule TAKE 1 CAPSULE BY MOUTH EVERY DAY 09/28/20  Yes Maryam Feely, Ines Bloomer, MD  rosuvastatin (CRESTOR) 20 MG tablet TAKE 1 TABLET BY MOUTH EVERY DAY 06/18/21  Yes Ichael Pullara, Ines Bloomer, MD  metoprolol succinate (TOPROL-XL) 25 MG 24 hr tablet Take 0.5 tablets (12.5 mg total) by mouth daily. TAKE 1/2 TABLET BY MOUTH  DAILY AT LUNCH OR IN THE  AFTERNOON 07/07/20 10/05/20  Horald Pollen, MD    Allergies  Allergen Reactions   Ibuprofen    Aspirin Hives   Atorvastatin     Yellow fingernails   Nsaids Hives    Patient Active Problem List   Diagnosis Date Noted   History of arthritis 08/15/2020   History of gastroesophageal reflux (GERD) 08/15/2020   Chronic bilateral low back pain without sciatica 06/30/2019   Carotid artery disease ([place]) 12/19/2018   Bilateral carotid artery stenosis, R- 40-59%, L 1-39% 11/2017 03/25/2018   Dyslipidemia 03/25/2018   GERD (gastroesophageal reflux disease) 04/12/2016   Dyspepsia 11/02/2015   Chronic high back pain 09/24/2015   Essential hypertension 10/03/2012   Hyperlipidemia 10/03/2012   DYSPEPSIA&OTHER SPEC DISORDERS FUNCTION STOMACH 07/20/2009    Past Medical History:  Diagnosis Date   Abdominal pain, chronic, epigastric 11/02/2015   Started amitriptyline at hospitalization 04/13/2016 EGD x 2 negative Also w/ chronic chest wall and back pain Would try not to work up further    Anxiety    Arthritis    Back pain    Beta thalassemia minor    diagnosed by hematology Dr. Jennette Kettle, also with sickle cell trait   Chronic high back pain 09/24/2015   Colon polyps    GERD (gastroesophageal reflux disease)    Hyperlipidemia    Hypertension    Sickle cell trait (HCC)    diagnosed by hematology Dr. Lindi Adie, coexsisting with beta-thalassemia minor    Past Surgical  History:  Procedure Laterality Date   ABDOMINAL HYSTERECTOMY     COLONOSCOPY     ESOPHAGOGASTRODUODENOSCOPY     ESOPHAGOGASTRODUODENOSCOPY [name] 04/13/2016   Procedure: ESOPHAGOGASTRODUODENOSCOPY (EGD);  Surgeon: Gatha Mayer, MD;  Location: [facility];  Service: Endoscopy;  Laterality: [name];    Social History   Socioeconomic History   Marital status: Married    Spouse name: Not on file   Number of children: 3   Years of education: Not on file   Highest education level: Not on file  Occupational History   Not on file  Tobacco Use   Smoking status: Never    Smokeless tobacco: Never  Vaping Use   Vaping Use: Never used  Substance and Sexual Activity   Alcohol use: No    Alcohol/week: 0.0 standard drinks   Drug use: No   Sexual activity: Yes    Comment: 1st intercourse- 72, partner-1, married- 53 yrs   Other Topics Concern   Not on file  Social History Narrative   Lives with husband and works at [employer] Strain: Not on file  Food Insecurity: Not on file  Transportation Needs: Not on file  Physical Activity: Not on file  Stress: Not on file  Social Connections: Not on file  Intimate Partner Violence: Not on file    Family History  Problem Relation Age of Onset   Aneurysm Mother    Diabetes Father    Colon cancer Brother 67   Liver cancer Brother    Esophageal cancer Neg Hx    Stomach cancer Neg Hx    Pancreatic cancer Neg Hx      Review of Systems  Constitutional: Negative.  Negative for chills and fever.  HENT: Negative.  Negative for congestion and sore throat.   Respiratory: Negative.  Negative for cough and shortness of breath.   Cardiovascular: Negative.  Negative for chest pain and palpitations.  Gastrointestinal:  Negative for abdominal pain, diarrhea, nausea and vomiting.  Genitourinary: Negative.  Negative for dysuria and hematuria.  Musculoskeletal: Negative.   Skin: Negative.  Negative for rash.  Neurological:  Negative for dizziness and headaches.  All other systems reviewed and are negative.  Today's Vitals   06/20/21 1445  BP: 130/66  Pulse: 70  Temp: 98.7 F (37.1 C)  TempSrc: Oral  SpO2: 97%  Weight: 194 lb 12.8 oz (88.4 kg)  Height: 5\' 4"  (1.626 m)   Body mass index is 33.44 kg/m.  Physical Exam Vitals reviewed.  Constitutional:      Appearance: Normal appearance.  HENT:     Head: Normocephalic.  Eyes:     Extraocular Movements: Extraocular movements intact.     Conjunctiva/sclera: Conjunctivae normal.     Pupils: Pupils are  equal, round, and reactive to light.  Cardiovascular:     Rate and Rhythm: Normal rate and regular rhythm.     Pulses: Normal pulses.     Heart sounds: Normal heart sounds.  Pulmonary:     Effort: Pulmonary effort is normal.     Breath sounds: Normal breath sounds.  Musculoskeletal:        General: Normal range of motion.     Cervical back: Normal range of motion and neck supple.  Skin:    General: Skin is warm and dry.     Capillary Refill: Capillary refill takes less than 2 seconds.  Neurological:     General: No focal deficit present.  Mental Status: She is alert and oriented to person, place, and time.  Psychiatric:        Mood and Affect: Mood normal.        Behavior: Behavior normal.     ASSESSMENT & PLAN: A total of 30 minutes was spent with the patient and counseling/coordination of care regarding hypertension and cardiovascular risks associated with this condition, management of hypertension, review of all medications and changes made, review of most recent office visit notes, review of most recent blood work results, dietary approach to stop hypertension, education on nutrition and need to decrease daily carbohydrate intake, health maintenance items, prognosis, documentation and need for follow-up in 3 months.  Essential hypertension Well-controlled hypertension.  Continue metoprolol succinate 25 mg daily Stop nifedipine use and start daily lisinopril 40 mg. Advised to monitor blood pressure readings at home on a daily basis and keep a log. Dietary approaches to stop hypertension discussed. Follow-up in 3 months. Jenille was seen today for medication review and medication refills.  Diagnoses and all orders for this visit:  Dyslipidemia -     rosuvastatin (CRESTOR) 20 MG tablet; Take 1 tablet (20 mg total) by mouth daily.  Essential hypertension -     metoprolol succinate (TOPROL-XL) 25 MG 24 hr tablet; Take 0.5 tablets (12.5 mg total) by mouth daily. TAKE 1/2 TABLET  BY MOUTH  DAILY AT LUNCH OR IN THE  AFTERNOON -     lisinopril (ZESTRIL) 40 MG tablet; Take 1 tablet (40 mg total) by mouth daily.  Bilateral carotid artery stenosis, R- 40-59%, L 1-39% 11/2017 -     metoprolol succinate (TOPROL-XL) 25 MG 24 hr tablet; Take 0.5 tablets (12.5 mg total) by mouth daily. TAKE 1/2 TABLET BY MOUTH  DAILY AT LUNCH OR IN THE  AFTERNOON  Gastroesophageal reflux disease without esophagitis -     omeprazole (PRILOSEC) 40 MG capsule; TAKE 1 CAPSULE BY MOUTH EVERY DAY  Patient Instructions  Continue metoprolol succinate 25 mg daily No tome Nifedipine Empieza Lisinopril 40mg  daily Continue chequeando su presion arterial todos los dias. Hipertensin en los adultos Hypertension, Adult El trmino hipertensin es otra forma de denominar a la presin arterial elevada. La presin arterial elevada fuerza al corazn a trabajar ms parabombear la sangre. Esto puede causar problemas con el paso del [place]. Una lectura de presin arterial est compuesta por 2 nmeros. Hay un nmero superior (sistlico) sobre un nmero inferior (diastlico). Lo ideal es tener la presin arterial por debajo de 120/80. Las elecciones saludables pueden ayudar a Engineer, materials presin arterial, o tal vez necesitemedicamentos para bajarla. Cules son las causas? Se desconoce la causa de esta afeccin. Algunas afecciones pueden estarrelacionadas con la presin arterial alta. Qu incrementa el riesgo? Fumar. Tener diabetes mellitus tipo 2, colesterol alto, o ambos. No hacer la cantidad suficiente de actividad fsica o ejercicio. Tener sobrepeso. Consumir mucha grasa, azcar, caloras o sal (sodio) en su dieta. Beber alcohol en exceso. Tener una enfermedad renal a largo plazo (crnica). Tener antecedentes familiares de presin arterial alta. Edad. Los riesgos aumentan con la edad. [place]aza. El riesgo es mayor para las Retail banker. Sexo. Antes de los 45 aos, los hombres corren ms [employer]. Despus de los 65 aos, las mujeres corren ms [employer]. Tener apnea obstructiva del sueo. Estrs. Cules son los signos o los sntomas? Es posible que la presin arterial alta puede no cause sntomas. La presin arterial muy alta (crisis hipertensiva) puede provocar: Dolor de cabeza.  Sensaciones de preocupacin o nerviosismo (ansiedad). Falta de aire. Hemorragia nasal. Sensacin de malestar en el estmago (nuseas). Vmitos. Cambios en la forma de ver. Dolor muy intenso en el pecho. Convulsiones. Cmo se trata? Esta afeccin se trata haciendo cambios saludables en el estilo de vida, por ejemplo: Consumir alimentos saludables. Hacer ms ejercicio. Beber menos alcohol. El mdico puede recetarle medicamentos si los cambios en el estilo de vida no son suficientes para Child psychotherapist la presin arterial y si: El nmero de arriba est por encima de 130. El nmero de abajo est por encima de 80. Su presin arterial personal ideal puede variar. Siga estas instrucciones en su casa: Comida y bebida  Si se lo dicen, siga el plan de alimentacin de DASH (Dietary Approaches to Stop Hypertension, Maneras de alimentarse para detener la hipertensin). Para seguir este plan: Llene la mitad del plato de cada comida con frutas y verduras. Llene un cuarto del plato de cada comida con cereales integrales. Los cereales integrales incluyen pasta integral, arroz integral y pan integral. Coma y beba productos lcteos con bajo contenido de grasa, como leche descremada o yogur bajo en grasas. Llene un cuarto del plato de cada comida con protenas bajas en grasa (magras). Las protenas bajas en grasa incluyen pescado, pollo sin piel, huevos, frijoles y tofu. Evite consumir carne grasa, carne curada y procesada, o pollo con piel. Evite consumir alimentos prehechos o procesados. Consuma menos de 1500 mg de sal por da. No beba alcohol si: El mdico le indica que no lo haga. Est  embarazada, puede estar embarazada o est tratando de [place]. Si bebe alcohol: Limite la cantidad que bebe a lo siguiente: De 0 a 1 medida por da para las mujeres. De 0 a 2 medidas por da para los hombres. Est atento a la cantidad de alcohol que hay en las bebidas que toma. En los [place], una medida equivale a una botella de cerveza de 12 oz (355 ml), un vaso de vino de 5 oz (148 ml) o un vaso de una bebida alcohlica de alta graduacin de 1 oz (44 ml).  Estilo de vida  Trabaje con su mdico para mantenerse en un peso saludable o para perder peso. Pregntele a su mdico cul es el peso recomendable para usted. Haga al menos 30 minutos de ejercicio la [employer] de la [place]. Estos pueden incluir caminar, nadar o andar en bicicleta. Realice al menos 30 minutos de ejercicio que fortalezca sus msculos (ejercicios de resistencia) al menos 3 das a la [place]. Estos pueden incluir levantar pesas o hacer Pilates. No consuma ningn producto que contenga nicotina o tabaco, como cigarrillos, cigarrillos electrnicos y tabaco de Higher education careers adviser. Si necesita ayuda para dejar de fumar, consulte al [employer]. Controle su presin arterial en su casa tal como le indic el mdico. Concurra a todas las visitas de seguimiento como se lo haya indicado el mdico. Esto es importante.  Medicamentos [employer] de venta libre y los recetados solamente como se lo haya indicado el mdico. Siga cuidadosamente las indicaciones. No omita las dosis de medicamentos para la presin arterial. Los medicamentos pierden eficacia si omite dosis. El hecho de omitir las dosis tambin [place] el riesgo de otros problemas. Pregntele a su mdico a qu efectos secundarios o reacciones a los Careers information officer. Comunquese con un mdico si: Piensa que tiene [place] reaccin a los medicamentos que est tomando. Tiene dolores de cabeza frecuentes (recurrentes). Se siente mareado. Tiene  hinchazn en los  tobillos. Tiene problemas de visin. Solicite ayuda inmediatamente si: Siente un dolor de cabeza muy intenso. Empieza a sentirse desorientado (confundido). Se siente dbil o adormecido. Siente que va a desmayarse. Tiene un dolor muy intenso en las siguientes zonas: Pecho. Vientre (abdomen). Vomita ms de una vez. Tiene dificultad para respirar. Resumen El trmino hipertensin es otra forma de denominar a la presin arterial elevada. La presin arterial elevada fuerza al corazn a trabajar ms para bombear la sangre. Para la [employer], una presin arterial normal es menor que 120/80. Las decisiones saludables pueden ayudarle a disminuir su presin arterial. Si no puede bajar su presin arterial mediante decisiones saludables, es posible que deba tomar medicamentos. Esta informacin no tiene Marine scientist el consejo del mdico. Asegresede hacerle al mdico cualquier pregunta que tenga. Document Revised: 09/25/2018 Document Reviewed: 09/25/2018 Elsevier Patient Education  2022 [place], MD [facility]

## 2021-06-20 NOTE — Assessment & Plan Note (Signed)
Well-controlled hypertension.  Continue metoprolol succinate 25 mg daily Stop nifedipine use and start daily lisinopril 40 mg. Advised to monitor blood pressure readings at home on a daily basis and keep a log. Dietary approaches to stop hypertension discussed. Follow-up in 3 months.

## 2021-06-20 NOTE — Patient Instructions (Signed)
Continue metoprolol succinate 25 mg daily No tome Nifedipine Empieza Lisinopril 40mg  daily Continue chequeando su presion arterial todos los dias. Hipertensin en los adultos Hypertension, Adult El trmino hipertensin es otra forma de denominar a la presin arterial elevada. La presin arterial elevada fuerza al corazn a trabajar ms parabombear la sangre. Esto puede causar problemas con el paso del [place]. Una lectura de presin arterial est compuesta por 2 nmeros. Hay un nmero superior (sistlico) sobre un nmero inferior (diastlico). Lo ideal es tener la presin arterial por debajo de 120/80. Las elecciones saludables pueden ayudar a Engineer, materials presin arterial, o tal vez necesitemedicamentos para bajarla. Cules son las causas? Se desconoce la causa de esta afeccin. Algunas afecciones pueden estarrelacionadas con la presin arterial alta. Qu incrementa el riesgo? Fumar. Tener diabetes mellitus tipo 2, colesterol alto, o ambos. No hacer la cantidad suficiente de actividad fsica o ejercicio. Tener sobrepeso. Consumir mucha grasa, azcar, caloras o sal (sodio) en su dieta. Beber alcohol en exceso. Tener una enfermedad renal a largo plazo (crnica). Tener antecedentes familiares de presin arterial alta. Edad. Los riesgos aumentan con la edad. [place]. El riesgo es mayor para las Retail banker. Sexo. Antes de los 45 aos, los hombres corren ms [employer]. Despus de los 65 aos, las mujeres corren ms [employer]. Tener apnea obstructiva del sueo. Estrs. Cules son los signos o los sntomas? Es posible que la presin arterial alta puede no cause sntomas. La presin arterial muy alta (crisis hipertensiva) puede provocar: Dolor de cabeza. Sensaciones de preocupacin o nerviosismo (ansiedad). Falta de aire. Hemorragia nasal. Sensacin de malestar en el estmago (nuseas). Vmitos. Cambios en la forma de ver. Dolor muy intenso en el  pecho. Convulsiones. Cmo se trata? Esta afeccin se trata haciendo cambios saludables en el estilo de vida, por ejemplo: Consumir alimentos saludables. Hacer ms ejercicio. Beber menos alcohol. El mdico puede recetarle medicamentos si los cambios en el estilo de vida no son suficientes para Child psychotherapist la presin arterial y si: El nmero de arriba est por encima de 130. El nmero de abajo est por encima de 80. Su presin arterial personal ideal puede variar. Siga estas instrucciones en su casa: Comida y bebida  Si se lo dicen, siga el plan de alimentacin de DASH (Dietary Approaches to Stop Hypertension, Maneras de alimentarse para detener la hipertensin). Para seguir este plan: Llene la mitad del plato de cada comida con frutas y verduras. Llene un cuarto del plato de cada comida con cereales integrales. Los cereales integrales incluyen pasta integral, arroz integral y pan integral. Coma y beba productos lcteos con bajo contenido de grasa, como leche descremada o yogur bajo en grasas. Llene un cuarto del plato de cada comida con protenas bajas en grasa (magras). Las protenas bajas en grasa incluyen pescado, pollo sin piel, huevos, frijoles y tofu. Evite consumir carne grasa, carne curada y procesada, o pollo con piel. Evite consumir alimentos prehechos o procesados. Consuma menos de 1500 mg de sal por da. No beba alcohol si: El mdico le indica que no lo haga. Est embarazada, puede estar embarazada o est tratando de [place]. Si bebe alcohol: Limite la cantidad que bebe a lo siguiente: De 0 a 1 medida por da para las mujeres. De 0 a 2 medidas por da para los hombres. Est atento a la cantidad de alcohol que hay en las bebidas que toma. En los [place], una medida equivale a una botella de cerveza de 12 oz (355  ml), un vaso de vino de 5 oz (148 ml) o un vaso de una bebida alcohlica de alta graduacin de 1 oz (44 ml).  Estilo de vida  Trabaje con su  mdico para mantenerse en un peso saludable o para perder peso. Pregntele a su mdico cul es el peso recomendable para usted. Haga al menos 30 minutos de ejercicio la [employer] de la [place]. Estos pueden incluir caminar, nadar o andar en bicicleta. Realice al menos 30 minutos de ejercicio que fortalezca sus msculos (ejercicios de resistencia) al menos 3 das a la [place]. Estos pueden incluir levantar pesas o hacer Pilates. No consuma ningn producto que contenga nicotina o tabaco, como cigarrillos, cigarrillos electrnicos y tabaco de Higher education careers adviser. Si necesita ayuda para dejar de fumar, consulte al [employer]. Controle su presin arterial en su casa tal como le indic el mdico. Concurra a todas las visitas de seguimiento como se lo haya indicado el mdico. Esto es importante.  Medicamentos [employer] de venta libre y los recetados solamente como se lo haya indicado el mdico. Siga cuidadosamente las indicaciones. No omita las dosis de medicamentos para la presin arterial. Los medicamentos pierden eficacia si omite dosis. El hecho de omitir las dosis tambin [place] el riesgo de otros problemas. Pregntele a su mdico a qu efectos secundarios o reacciones a los Careers information officer. Comunquese con un mdico si: Piensa que tiene [place] reaccin a los medicamentos que est tomando. Tiene dolores de cabeza frecuentes (recurrentes). Se siente mareado. Tiene hinchazn en los tobillos. Tiene problemas de visin. Solicite ayuda inmediatamente si: Siente un dolor de cabeza muy intenso. Empieza a sentirse desorientado (confundido). Se siente dbil o adormecido. Siente que va a desmayarse. Tiene un dolor muy intenso en las siguientes zonas: Pecho. Vientre (abdomen). Vomita ms de una vez. Tiene dificultad para respirar. Resumen El trmino hipertensin es otra forma de denominar a la presin arterial elevada. La presin arterial elevada fuerza al corazn a trabajar ms  para bombear la sangre. Para la [employer], una presin arterial normal es menor que 120/80. Las decisiones saludables pueden ayudarle a disminuir su presin arterial. Si no puede bajar su presin arterial mediante decisiones saludables, es posible que deba tomar medicamentos. Esta informacin no tiene Marine scientist el consejo del mdico. Asegresede hacerle al mdico cualquier pregunta que tenga. Document Revised: 09/25/2018 Document Reviewed: 09/25/2018 Elsevier Patient Education  [place].

## 2021-08-01 ENCOUNTER — Encounter: Payer: Managed Care, Other (non HMO) | Admitting: Gastroenterology

## 2021-08-30 ENCOUNTER — Other Ambulatory Visit: Payer: Self-pay | Admitting: Emergency Medicine

## 2021-08-30 DIAGNOSIS — I10 Essential (primary) hypertension: Secondary | ICD-10-CM

## 2021-09-06 ENCOUNTER — Ambulatory Visit: Payer: Managed Care, Other (non HMO) | Admitting: Cardiology

## 2021-09-27 ENCOUNTER — Emergency Department (HOSPITAL_COMMUNITY)
Admission: EM | Admit: 2021-09-27 | Discharge: 2021-09-27 | Disposition: A | Discharge status: Home / Self Care | Payer: Managed Care, Other (non HMO) | Attending: Emergency Medicine | Admitting: Emergency Medicine

## 2021-09-27 ENCOUNTER — Emergency Department (HOSPITAL_COMMUNITY): Payer: Managed Care, Other (non HMO)

## 2021-09-27 ENCOUNTER — Other Ambulatory Visit: Payer: Self-pay

## 2021-09-27 ENCOUNTER — Encounter (HOSPITAL_COMMUNITY): Payer: Self-pay

## 2021-09-27 DIAGNOSIS — R059 Cough, unspecified: Secondary | ICD-10-CM | POA: Diagnosis not present

## 2021-09-27 DIAGNOSIS — Z79899 Other long term (current) drug therapy: Secondary | ICD-10-CM | POA: Insufficient documentation

## 2021-09-27 DIAGNOSIS — I10 Essential (primary) hypertension: Secondary | ICD-10-CM | POA: Diagnosis not present

## 2021-09-27 DIAGNOSIS — I2510 Atherosclerotic heart disease of native coronary artery without angina pectoris: Secondary | ICD-10-CM | POA: Diagnosis not present

## 2021-09-27 DIAGNOSIS — R079 Chest pain, unspecified: Secondary | ICD-10-CM | POA: Insufficient documentation

## 2021-09-27 DIAGNOSIS — I6523 Occlusion and stenosis of bilateral carotid arteries: Secondary | ICD-10-CM

## 2021-09-27 LAB — COMPREHENSIVE METABOLIC PANEL
ALT: 24 U/L (ref 0–44)
AST: 22 U/L (ref 15–41)
Albumin: 4.1 g/dL (ref 3.5–5.0)
Alkaline Phosphatase: 110 U/L (ref 38–126)
Anion gap: 8 (ref 5–15)
BUN: 12 mg/dL (ref 8–23)
CO2: 28 mmol/L (ref 22–32)
Calcium: 9.6 mg/dL (ref 8.9–10.3)
Chloride: 110 mmol/L (ref 98–111)
Creatinine, Ser: 0.5 mg/dL (ref 0.44–1.00)
GFR, Estimated: >60 mL/min (ref >60)
Glucose, Bld: 99 mg/dL (ref 70–99)
Potassium: 4.2 mmol/L (ref 3.5–5.1)
Sodium: 146 mmol/L — ABNORMAL HIGH (ref 135–145)
Total Bilirubin: 0.4 mg/dL (ref 0.3–1.2)
Total Protein: 7.8 g/dL (ref 6.5–8.1)

## 2021-09-27 LAB — CBC WITH DIFFERENTIAL/PLATELET
Abs Immature Granulocytes: 0.01 10*3/uL (ref 0.00–0.07)
Basophils Absolute: 0.1 10*3/uL (ref 0.0–0.1)
Basophils Relative: 1 %
Eosinophils Absolute: 0.1 10*3/uL (ref 0.0–0.5)
Eosinophils Relative: 2 %
HCT: 36.5 % (ref 36.0–46.0)
Hemoglobin: 11.9 g/dL — ABNORMAL LOW (ref 12.0–15.0)
Immature Granulocytes: 0 %
Lymphocytes Relative: 29 %
Lymphs Abs: 1.7 10*3/uL (ref 0.7–4.0)
MCH: 25.1 pg — ABNORMAL LOW (ref 26.0–34.0)
MCHC: 32.6 g/dL (ref 30.0–36.0)
MCV: 77 fL — ABNORMAL LOW (ref 80.0–100.0)
Monocytes Absolute: 0.4 10*3/uL (ref 0.1–1.0)
Monocytes Relative: 6 %
Neutro Abs: 3.6 10*3/uL (ref 1.7–7.7)
Neutrophils Relative %: 62 %
Platelets: 265 10*3/uL (ref 150–400)
RBC: 4.74 MIL/uL (ref 3.87–5.11)
RDW: 15.4 % (ref 11.5–15.5)
WBC: 5.8 10*3/uL (ref 4.0–10.5)
nRBC: 0 % (ref 0.0–0.2)

## 2021-09-27 LAB — TROPONIN I (HIGH SENSITIVITY)
Troponin I (High Sensitivity): 3 ng/L (ref <18)
Troponin I (High Sensitivity): 2 ng/L (ref <18)

## 2021-09-27 MED ORDER — SODIUM CHLORIDE 0.9 % IV SOLN: INTRAVENOUS | Status: DC

## 2021-09-27 MED ORDER — METOPROLOL SUCCINATE ER 25 MG PO TB24: 25.0000 mg | ORAL_TABLET | Freq: Every day | ORAL | 0 refills | Status: DC

## 2021-09-27 NOTE — ED Notes (Signed)
MD at bedside using interpreter

## 2021-09-27 NOTE — ED Triage Notes (Signed)
Patient reports chest pressure and hbp with nausea .

## 2021-09-27 NOTE — ED Notes (Signed)
Pt ambulatory in triage. 

## 2021-09-27 NOTE — ED Provider Notes (Addendum)
[facility] DEPT Provider Note   CSN: [id number] Arrival date & time: 09/27/21  7902     History Chief Complaint  Patient presents with   Hypertension    Makina Skow is a 65 y.o. female.  64 year old female presents with increased blood pressure for over a week.  Denies any changes to her medications.  Had some constant chest pain today that was worse with movement and not associated dyspnea or diaphoresis.  No exertional component to it.  Denies any headache or emesis.  No neck discomfort.  States her blood pressure normally runs in the systolics of 409.  Denies any weakness.  Video interpreter used for this encounter      Past Medical History:  Diagnosis Date   Abdominal pain, chronic, epigastric 11/02/2015   Started amitriptyline at hospitalization 04/13/2016 EGD x 2 negative Also w/ chronic chest wall and back pain Would try not to work up further    Anxiety    Arthritis    Back pain    Beta thalassemia minor    diagnosed by hematology Dr. Jennette Kettle, also with sickle cell trait   Chronic high back pain 09/24/2015   Colon polyps    GERD (gastroesophageal reflux disease)    Hyperlipidemia    Hypertension    Sickle cell trait ([place])    diagnosed by hematology Dr. Lindi Adie, coexsisting with beta-thalassemia minor    Patient Active Problem List   Diagnosis Date Noted   History of arthritis 08/15/2020   History of gastroesophageal reflux (GERD) 08/15/2020   Chronic bilateral low back pain without sciatica 06/30/2019   Carotid artery disease ([place]) 12/19/2018   Bilateral carotid artery stenosis, R- 40-59%, L 1-39% 11/2017 03/25/2018   Dyslipidemia 03/25/2018   GERD (gastroesophageal reflux disease) 04/12/2016   Dyspepsia 11/02/2015   Chronic high back pain 09/24/2015   Essential hypertension 10/03/2012   Hyperlipidemia 10/03/2012   DYSPEPSIA&OTHER SPEC DISORDERS FUNCTION STOMACH 07/20/2009    Past Surgical History:  Procedure  Laterality Date   ABDOMINAL HYSTERECTOMY     COLONOSCOPY     ESOPHAGOGASTRODUODENOSCOPY     ESOPHAGOGASTRODUODENOSCOPY [name] 04/13/2016   Procedure: ESOPHAGOGASTRODUODENOSCOPY (EGD);  Surgeon: Gatha Mayer, MD;  Location: [facility];  Service: Endoscopy;  Laterality: [name];     OB History     Gravida  4   Para  3   Term      Preterm      AB  1   Living  3      SAB  1   IAB      Ectopic      Multiple      Live Births              Family History  Problem Relation Age of Onset   Aneurysm Mother    Diabetes Father    Colon cancer Brother 41   Liver cancer Brother    Esophageal cancer Neg Hx    Stomach cancer Neg Hx    Pancreatic cancer Neg Hx     Social History   Tobacco Use   Smoking status: Never   Smokeless tobacco: Never  Vaping Use   Vaping Use: Never used  Substance Use Topics   Alcohol use: No    Alcohol/week: 0.0 standard drinks   Drug use: No    Home Medications Prior to Admission medications   Medication Sig Start Date End Date Taking? Authorizing Provider  ALPRAZolam Duanne Moron) 0.5 MG tablet TAKE 1 TABLET  BY MOUTH EVERY DAY AS NEEDED FOR ANXIETY 12/15/20   Horald Pollen, MD  CALCIUM PO Take 1 tablet by mouth daily.     [provider]  lisinopril (ZESTRIL) 40 MG tablet Take 1 tablet (40 mg total) by mouth daily. 06/20/21   Horald Pollen, MD  metoprolol succinate (TOPROL-XL) 25 MG 24 hr tablet Take 0.5 tablets (12.5 mg total) by mouth daily. TAKE 1/2 TABLET BY MOUTH  DAILY AT LUNCH OR IN THE  AFTERNOON 06/20/21 09/18/21  Horald Pollen, MD  omeprazole (PRILOSEC) 40 MG capsule TAKE 1 CAPSULE BY MOUTH EVERY DAY 06/20/21   Horald Pollen, MD  rosuvastatin (CRESTOR) 20 MG tablet Take 1 tablet (20 mg total) by mouth daily. 06/20/21   Horald Pollen, MD    Allergies    Ibuprofen, Aspirin, Atorvastatin, and Nsaids  Review of Systems   Review of Systems  All other systems reviewed and are  negative.  Physical Exam Updated Vital Signs BP (!) 178/89   Pulse 77   Temp 98.9 F (37.2 C) (Oral)   Resp 18   Ht 1.676 m (5\' 6" )   Wt 86.6 kg   SpO2 99%   BMI 30.83 kg/m   Physical Exam Vitals and nursing note reviewed.  Constitutional:      General: She is not in acute distress.    Appearance: Normal appearance. She is well-developed. She is not toxic-appearing.  HENT:     Head: Normocephalic and atraumatic.  Eyes:     General: Lids are normal.     Conjunctiva/sclera: Conjunctivae normal.     Pupils: Pupils are equal, round, and reactive to light.  Neck:     Thyroid: No thyroid mass.     Trachea: No tracheal deviation.  Cardiovascular:     Rate and Rhythm: Normal rate and regular rhythm.     Heart sounds: Normal heart sounds. No murmur heard.   No gallop.  Pulmonary:     Effort: Pulmonary effort is normal. No respiratory distress.     Breath sounds: Normal breath sounds. No stridor. No decreased breath sounds, wheezing, rhonchi or rales.  Abdominal:     General: There is no distension.     Palpations: Abdomen is soft.     Tenderness: There is no abdominal tenderness. There is no rebound.  Musculoskeletal:        General: No tenderness. Normal range of motion.     Cervical back: Normal range of motion and neck supple.  Skin:    General: Skin is warm and dry.     Findings: No abrasion or rash.  Neurological:     Mental Status: She is alert and oriented to person, place, and time. Mental status is at baseline.     GCS: GCS eye subscore is 4. GCS verbal subscore is 5. GCS motor subscore is 6.     Cranial Nerves: Cranial nerves are intact. No cranial nerve deficit.     Sensory: No sensory deficit.     Motor: Motor function is intact.  Psychiatric:        Attention and Perception: Attention normal.        Speech: Speech normal.        Behavior: Behavior normal.    ED Results / Procedures / Treatments   Labs (all labs ordered are listed, but only abnormal  results are displayed) Labs Reviewed  CBC WITH DIFFERENTIAL/PLATELET  COMPREHENSIVE METABOLIC PANEL  TROPONIN I (HIGH SENSITIVITY)    EKG  EKG Interpretation  Date/Time:  Wednesday September 27 2021 09:54:11 EDT Ventricular Rate:  71 PR Interval:  203 QRS Duration: 99 QT Interval:  403 QTC Calculation: 438 R Axis:   23 Text Interpretation: Sinus rhythm Minimal ST elevation, lateral leads No significant change since last tracing Confirmed by Lacretia Leigh ([id number]) on 09/27/2021 10:08:30 AM  Radiology No results found.  Procedures Procedures   Medications Ordered in ED Medications  0.9 %  sodium chloride infusion (has no administration in time range)    ED Course  I have reviewed the triage vital signs and the nursing notes.  Pertinent labs & imaging results that were available during my care of the patient were reviewed by me and considered in my medical decision making (see chart for details).    MDM Rules/Calculators/A&P                           Patient blood pressure has improved on its own here.  Labs are reassuring.  Will discharge home will make adjustment to her beta-blocker for her blood pressure management Final Clinical Impression(s) / ED Diagnoses Final diagnoses:  Cough    Rx / DC Orders ED Discharge Orders     None        Lacretia Leigh, MD 09/27/21 1424    Lacretia Leigh, MD 09/27/21 [place]    Lacretia Leigh, MD 09/27/21 1428

## 2021-09-27 NOTE — Discharge Instructions (Addendum)
Follow-up with your doctor for your blood pressure management

## 2021-09-29 ENCOUNTER — Encounter: Payer: Self-pay | Admitting: Gastroenterology

## 2021-09-29 ENCOUNTER — Ambulatory Visit (INDEPENDENT_AMBULATORY_CARE_PROVIDER_SITE_OTHER): Payer: Managed Care, Other (non HMO) | Admitting: Gastroenterology

## 2021-09-29 VITALS — BP 110/70 | HR 78 | Ht 66.0 in | Wt 194.0 lb

## 2021-09-29 DIAGNOSIS — K219 Gastro-esophageal reflux disease without esophagitis: Secondary | ICD-10-CM

## 2021-09-29 DIAGNOSIS — R1310 Dysphagia, unspecified: Secondary | ICD-10-CM

## 2021-09-29 DIAGNOSIS — Z86010 Personal history of colonic polyps: Secondary | ICD-10-CM | POA: Diagnosis not present

## 2021-09-29 NOTE — Progress Notes (Signed)
Review of pertinent gastrointestinal problems: 1. Intermittent upper abd pains. Likely functional. Has been going on for years.  EGD in 02/2015 by Dr. Ardis Hughs that showed a 3 cm hiatal hernia and some non-specific gastritis that was Hpylori negative on biopsy.  EGD 03/2016 Dr. Carlean Purl was normal.  [place] 2014 and 2016 were both normal.  Ultrasound 2019 normal, normal gallbladder.  HIDA scan June 2022 was normal. 2. Colonoscopy Dr. Paulita Fujita 02/2012 (as outpatient) found small polyp, was inflammatory, he recommended recall at 5 year interval due to "fair prep" 3.  Left lower quadrant pain evaluation 2017.  CT scan abdomen pelvis with IV and oral contrast July 2017 was essentially normal.  No a CT scan August 2017 again normal.  CT scan November 2018 right lower quadrant pain normal.  Blood work July 2021 showed normal CBC except for MCV of 77, complete metabolic profile was normal except for alk phos 142.  Ultrasound 2019 showed normal gallbladder.   HPI: This is a pleasant 65 year old woman who speaks [place].  She is here with a professional interpreter.  She believes she is having trouble with acid reflux.  This is especially at night when she has a dry mouth and a dry throat.  She does not have burning in her chest.  She tells me this is coming from her stomach and makes her nauseous.  Omeprazole is on her medicine list but she does not actually take that.  Instead she takes the Nexium shortly before lunch every morning.  She does have dysphagia 3-4 times per week this is to solids only.  Never liquids.  This is been going on for several months.  Colon cancer does run in her family.  She tells me today that her uncle and the brother both had colon cancer.  He is she says her brother was diagnosed at the age of 26.  Blood work October 5597 complete metabolic profile was normal except for sodium 146, troponin was negative.  CBC was normal except for hemoglobin 11.9 and MCV 77.  MCV for many years has been  slightly low.  She has been diagnosed with beta thalassemia minor as well as sickle cell trait by hematology.  She was in the emergency room 2 days ago with hypertension.  They did a battery of blood tests and chest x-ray, adjust her blood pressure medicine since she went home.  Her weight is down 4 pounds today compared to 1 year ago here in our office  Old Data Reviewed:     Review of systems: Pertinent positive and negative review of systems were noted in the above HPI section. All other review negative.   Past Medical History:  Diagnosis Date   Abdominal pain, chronic, epigastric 11/02/2015   Started amitriptyline at hospitalization 04/13/2016 EGD x 2 negative Also w/ chronic chest wall and back pain Would try not to work up further    Anxiety    Arthritis    Back pain    Beta thalassemia minor    diagnosed by hematology Dr. Jennette Kettle, also with sickle cell trait   Chronic high back pain 09/24/2015   Colon polyps    GERD (gastroesophageal reflux disease)    Hyperlipidemia    Hypertension    Sickle cell trait ([place])    diagnosed by hematology Dr. Lindi Adie, coexsisting with beta-thalassemia minor    Past Surgical History:  Procedure Laterality Date   ABDOMINAL HYSTERECTOMY     COLONOSCOPY     ESOPHAGOGASTRODUODENOSCOPY  ESOPHAGOGASTRODUODENOSCOPY [name] 04/13/2016   Procedure: ESOPHAGOGASTRODUODENOSCOPY (EGD);  Surgeon: Carl E Gessner, MD;  Location: [facility];  Service: Endoscopy;  Laterality: [name];    Current Outpatient Medications  Medication Sig Dispense Refill   ALPRAZolam (XANAX) 0.5 MG tablet TAKE 1 TABLET BY MOUTH EVERY DAY AS NEEDED FOR ANXIETY 30 tablet 1   CALCIUM PO Take 1 tablet by mouth daily.      lisinopril (ZESTRIL) 40 MG tablet Take 1 tablet (40 mg total) by mouth daily. 90 tablet 3   metoprolol succinate (TOPROL-XL) 25 MG 24 hr tablet Take 1 tablet (25 mg total) by mouth daily. 1 p.o. daily 30 tablet 0   omeprazole (PRILOSEC) 40 MG capsule TAKE 1  CAPSULE BY MOUTH EVERY DAY 90 capsule 1   rosuvastatin (CRESTOR) 20 MG tablet Take 1 tablet (20 mg total) by mouth daily. 90 tablet 3   No current facility-administered medications for this visit.    Allergies as of 09/29/2021 - Review Complete 09/29/2021  Allergen Reaction Noted   Ibuprofen  12/19/2018   Aspirin Hives    Atorvastatin  01/16/2017   Nsaids Hives 07/15/2009    Family History  Problem Relation Age of Onset   Aneurysm Mother    Diabetes Father    Colon cancer Brother 54   Liver cancer Brother    Esophageal cancer Neg Hx    Stomach cancer Neg Hx    Pancreatic cancer Neg Hx     Social History   Socioeconomic History   Marital status: Married    Spouse name: Not on file   Number of children: 3   Years of education: Not on file   Highest education level: Not on file  Occupational History   Not on file  Tobacco Use   Smoking status: Never   Smokeless tobacco: Never  Vaping Use   Vaping Use: Never used  Substance and Sexual Activity   Alcohol use: No    Alcohol/week: 0.0 standard drinks   Drug use: No   Sexual activity: Yes    Comment: 1st intercourse- 20, partner-1, married- 42 yrs   Other Topics Concern   Not on file  Social History Narrative   Lives with husband and works at [employer]   Social Determinants of Health   Financial Resource Strain: Not on file  Food Insecurity: Not on file  Transportation Needs: Not on file  Physical Activity: Not on file  Stress: Not on file  Social Connections: Not on file  Intimate Partner Violence: Not on file     Physical Exam: Ht 5' 6" (1.676 m)   Wt 194 lb (88 kg)   BMI 31.31 kg/m  Constitutional: generally well-appearing Psychiatric: alert and oriented x3 Eyes: extraocular movements intact Mouth: oral pharynx moist, no lesions Neck: supple no lymphadenopathy Cardiovascular: heart regular rate and rhythm Lungs: clear to auscultation bilaterally Abdomen: soft, nontender, nondistended, no obvious  ascites, no peritoneal signs, normal bowel sounds Extremities: no lower extremity edema bilaterally Skin: no lesions on visible extremities   Assessment and plan: 65 y.o. female with dysphagia, dry mouth, family history of colon cancer  She was here with a professional interpreter but has always nuances of history were lost in translation.  She is having dysphagia and has a family history of colon cancer in her brother at the age of 53.  I recommended EGD as well as colonoscopy at her soonest convenience.  I also recommended she start taking a Pepcid at bedtime every night as she seems   to be particularly bothered with dry mouth and sore throat at night.  She has to check with work about potential days off and she will contact us next week.  I see no reason for any further blood tests or imaging studies prior to eventual colonoscopy, EGD with possible dilation.   Please see the "Patient Instructions" section for addition details about the plan.   Owens Loffler, MD [facility] 09/29/2021, 2:21 PM  Cc: Horald Pollen, *  Total time on date of encounter was 40 minutes (this included time spent preparing to see the patient reviewing records; obtaining and/or reviewing separately obtained history; performing a medically appropriate exam and/or evaluation; counseling and educating the patient and family if present; ordering medications, tests or procedures if applicable; and documenting clinical information in the health record).

## 2021-09-29 NOTE — Patient Instructions (Addendum)
If you are age 64 or younger, your body mass index should be between 19-25. Your Body mass index is 31.31 kg/m. If this is out of the aformentioned range listed, please consider follow up with your Primary Care Provider.   __________________________________________________________  The [facility] providers would like to encourage you to use [facility] to communicate with providers for non-urgent requests or questions.  Due to long hold times on the telephone, sending your provider a message by [facility] may be a faster and more efficient way to get a response.  Please allow 48 business hours for a response.  Please remember that this is for non-urgent requests.   It has been recommended to you by your physician that you have an endoscopy and colonoscopy completed. Per your request, we did not schedule the procedure(s) today. Please contact our office at [phone] should you decide to have the procedure completed. You will be scheduled for a pre-visit and procedure at that time.  Please purchase the following medications over the counter and take as directed:  START: Pepcid 20mg  one tablet every night at bed time.  Thank you for entrusting me with your care and choosing [facility].  Dr Ardis Hughs

## 2021-10-02 ENCOUNTER — Encounter: Payer: Self-pay | Admitting: Emergency Medicine

## 2021-10-02 ENCOUNTER — Other Ambulatory Visit: Payer: Self-pay

## 2021-10-02 ENCOUNTER — Ambulatory Visit: Payer: Managed Care, Other (non HMO) | Admitting: Emergency Medicine

## 2021-10-02 VITALS — BP 150/80 | HR 72 | Temp 98.4°F | Ht 66.0 in | Wt 199.0 lb

## 2021-10-02 DIAGNOSIS — I10 Essential (primary) hypertension: Secondary | ICD-10-CM

## 2021-10-02 DIAGNOSIS — K219 Gastro-esophageal reflux disease without esophagitis: Secondary | ICD-10-CM

## 2021-10-02 DIAGNOSIS — E785 Hyperlipidemia, unspecified: Secondary | ICD-10-CM

## 2021-10-02 MED ORDER — METOPROLOL SUCCINATE ER 50 MG PO TB24
50.0000 mg | ORAL_TABLET | Freq: Every day | ORAL | 3 refills | Status: DC
Start: 2021-10-02 — End: 2022-10-11

## 2021-10-02 NOTE — Progress Notes (Signed)
Emily [name] 65 y.o.   Chief Complaint  Patient presents with   Hypertension    HISTORY OF PRESENT ILLNESS: This is a 65 y.o. female complaining of elevated blood pressure readings at home. Presently on lisinopril 40 mg daily and metoprolol succinate 25 mg daily. Recent visit to the emergency department on 09/27/2021.  Had normal troponins, normal blood work, normal chest x-ray, normal EKG. Emergency department visit notes reviewed with patient. Patient had normal echoes and stress test in 2020. The left ventricular ejection fraction is hyperdynamic (>65%). Nuclear stress EF: 72%. Blood pressure demonstrated a normal response to exercise. There was no ST segment deviation noted during stress. Defect 1: There is a small defect of mild severity present in the apex location. The study is normal. This is a low risk study.   Normal stress nuclear study with mild apical thinning but no ischemia.  Gated ejection fraction 72% with normal wall motion.   BP Readings from Last 3 Encounters:  10/02/21 (!) 150/80  09/29/21 110/70  09/27/21 (!) 153/85     HPI   Prior to Admission medications   Medication Sig Start Date End Date Taking? Authorizing Provider  ALPRAZolam Duanne Moron) 0.5 MG tablet TAKE 1 TABLET BY MOUTH EVERY DAY AS NEEDED FOR ANXIETY 12/15/20  Yes Dalal Livengood, Ines Bloomer, MD  CALCIUM PO Take 1 tablet by mouth daily.    Yes [provider]  lisinopril (ZESTRIL) 40 MG tablet Take 1 tablet (40 mg total) by mouth daily. 06/20/21  Yes Javed Cotto, Ines Bloomer, MD  metoprolol succinate (TOPROL-XL) 25 MG 24 hr tablet Take 1 tablet (25 mg total) by mouth daily. 1 p.o. daily 09/27/21 12/26/21 Yes Lacretia Leigh, MD  omeprazole (PRILOSEC) 40 MG capsule TAKE 1 CAPSULE BY MOUTH EVERY DAY 06/20/21  Yes Jaqulyn Chancellor, Ines Bloomer, MD  rosuvastatin (CRESTOR) 20 MG tablet Take 1 tablet (20 mg total) by mouth daily. 06/20/21  Yes Horald Pollen, MD    Allergies  Allergen Reactions    Ibuprofen    Aspirin Hives   Atorvastatin     Yellow fingernails   Nsaids Hives    Patient Active Problem List   Diagnosis Date Noted   History of arthritis 08/15/2020   History of gastroesophageal reflux (GERD) 08/15/2020   Chronic bilateral low back pain without sciatica 06/30/2019   Carotid artery disease ([place]) 12/19/2018   Bilateral carotid artery stenosis, R- 40-59%, L 1-39% 11/2017 03/25/2018   Dyslipidemia 03/25/2018   GERD (gastroesophageal reflux disease) 04/12/2016   Dyspepsia 11/02/2015   Chronic high back pain 09/24/2015   Essential hypertension 10/03/2012   Hyperlipidemia 10/03/2012   DYSPEPSIA&OTHER SPEC DISORDERS FUNCTION STOMACH 07/20/2009    Past Medical History:  Diagnosis Date   Abdominal pain, chronic, epigastric 11/02/2015   Started amitriptyline at hospitalization 04/13/2016 EGD x 2 negative Also w/ chronic chest wall and back pain Would try not to work up further    Anxiety    Arthritis    Back pain    Beta thalassemia minor    diagnosed by hematology Dr. Jennette Kettle, also with sickle cell trait   Chronic high back pain 09/24/2015   Colon polyps    GERD (gastroesophageal reflux disease)    Hyperlipidemia    Hypertension    Sickle cell trait (HCC)    diagnosed by hematology Dr. Lindi Adie, coexsisting with beta-thalassemia minor    Past Surgical History:  Procedure Laterality Date   ABDOMINAL HYSTERECTOMY     COLONOSCOPY     ESOPHAGOGASTRODUODENOSCOPY  ESOPHAGOGASTRODUODENOSCOPY [name] 04/13/2016   Procedure: ESOPHAGOGASTRODUODENOSCOPY (EGD);  Surgeon: Gatha Mayer, MD;  Location: [facility];  Service: Endoscopy;  Laterality: [name];    Social History   Socioeconomic History   Marital status: Married    Spouse name: Not on file   Number of children: 3   Years of education: Not on file   Highest education level: Not on file  Occupational History   Not on file  Tobacco Use   Smoking status: Never   Smokeless tobacco: Never  Vaping Use    Vaping Use: Never used  Substance and Sexual Activity   Alcohol use: No    Alcohol/week: 0.0 standard drinks   Drug use: No   Sexual activity: Yes    Comment: 1st intercourse- 17, partner-1, married- 71 yrs   Other Topics Concern   Not on file  Social History Narrative   Lives with husband and works at [employer] Strain: Not on file  Food Insecurity: Not on file  Transportation Needs: Not on file  Physical Activity: Not on file  Stress: Not on file  Social Connections: Not on file  Intimate Partner Violence: Not on file    Family History  Problem Relation Age of Onset   Aneurysm Mother    Diabetes Father    Colon cancer Brother 50   Liver cancer Brother    Esophageal cancer Neg Hx    Stomach cancer Neg Hx    Pancreatic cancer Neg Hx      Review of Systems  Constitutional: Negative.  Negative for chills and fever.  HENT: Negative.  Negative for congestion and sore throat.   Respiratory: Negative.  Negative for cough and shortness of breath.   Cardiovascular: Negative.  Negative for chest pain and palpitations.  Gastrointestinal: Negative.  Negative for abdominal pain, diarrhea, nausea and vomiting.  Genitourinary: Negative.   Musculoskeletal: Negative.   Skin: Negative.  Negative for rash.  Neurological: Negative.  Negative for dizziness and headaches.  All other systems reviewed and are negative.  Today's Vitals   10/02/21 1535  BP: (!) 150/80  Pulse: 72  Temp: 98.4 F (36.9 C)  TempSrc: Oral  SpO2: 95%  Weight: 199 lb (90.3 kg)  Height: 5\' 6"  (1.676 m)   Body mass index is 32.12 kg/m.  Physical Exam Vitals reviewed.  Constitutional:      Appearance: Normal appearance.  HENT:     Head: Normocephalic.  Eyes:     Extraocular Movements: Extraocular movements intact.     Conjunctiva/sclera: Conjunctivae normal.     Pupils: Pupils are equal, round, and reactive to light.  Cardiovascular:     Rate  and Rhythm: Normal rate and regular rhythm.     Pulses: Normal pulses.     Heart sounds: Normal heart sounds.  Pulmonary:     Effort: Pulmonary effort is normal.     Breath sounds: Normal breath sounds.  Musculoskeletal:        General: Normal range of motion.     Cervical back: Normal range of motion and neck supple.     Right lower leg: No edema.     Left lower leg: No edema.  Skin:    General: Skin is warm and dry.     Capillary Refill: Capillary refill takes less than 2 seconds.  Neurological:     General: No focal deficit present.     Mental Status: She is alert and oriented to  person, place, and time.  Psychiatric:        Mood and Affect: Mood normal.        Behavior: Behavior normal.     ASSESSMENT & PLAN: Problem List Items Addressed This Visit       Cardiovascular and Mediastinum   Essential hypertension - Primary    Elevated blood pressure readings here in the office and at home. Recommend to continue lisinopril at 40 mg daily and increase metoprolol succinate to 50 mg daily.  Continue monitoring blood pressure readings at home. Dietary approaches to stop hypertension discussed. Follow-up in 3 months.  Sooner as needed.      Relevant Medications   metoprolol succinate (TOPROL-XL) 50 MG 24 hr tablet     Digestive   GERD (gastroesophageal reflux disease)    Recent visit with GI doctor reviewed.  Scheduled for upper endoscopy and colonoscopy.         Other   Dyslipidemia    Diet and nutrition discussed.  Continue rosuvastatin 20 mg daily. The 10-year ASCVD risk score (Arnett DK, et al., 2019) is: 8.9%   Values used to calculate the score:     Age: 73 years     Sex: Female     Is Non-Hispanic African American: No     Diabetic: No     Tobacco smoker: No     Systolic Blood Pressure: 175 mmHg     Is BP treated: Yes     HDL Cholesterol: 55 mg/dL     Total Cholesterol: 190 mg/dL       Patient Instructions  Hipertensin en los adultos Hypertension,  Adult El trmino hipertensin es otra forma de denominar a la presin arterial elevada. La presin arterial elevada fuerza al corazn a trabajar ms para bombear la sangre. Esto puede causar problemas con el paso del [place]. Una lectura de presin arterial est compuesta por 2 nmeros. Hay un nmero superior (sistlico) sobre un nmero inferior (diastlico). Lo ideal es tener la presin arterial por debajo de 120/80. Las elecciones saludables pueden ayudar a Engineer, materials presin arterial, o tal vez necesite medicamentos para bajarla. Cules son las causas? Se desconoce la causa de esta afeccin. Algunas afecciones pueden estar relacionadas con la presin arterial alta. Qu incrementa el riesgo? Fumar. Tener diabetes mellitus tipo 2, colesterol alto, o ambos. No hacer la cantidad suficiente de actividad fsica o ejercicio. Tener sobrepeso. Consumir mucha grasa, azcar, caloras o sal (sodio) en su dieta. Beber alcohol en exceso. Tener una enfermedad renal a largo plazo (crnica). Tener antecedentes familiares de presin arterial alta. Edad. Los riesgos aumentan con la edad. Raza. El riesgo es mayor para las Retail banker. Sexo. Antes de los 45 aos, los hombres corren ms [employer]. Despus de los 65 aos, las mujeres corren ms [employer]. Tener apnea obstructiva del sueo. Estrs. Cules son los signos o los sntomas? Es posible que la presin arterial alta puede no cause sntomas. La presin arterial muy alta (crisis hipertensiva) puede provocar: Dolor de cabeza. Sensaciones de preocupacin o nerviosismo (ansiedad). Falta de aire. Hemorragia nasal. Sensacin de malestar en el estmago (nuseas). Vmitos. Cambios en la forma de ver. Dolor muy intenso en el pecho. Convulsiones. Cmo se trata? Esta afeccin se trata haciendo cambios saludables en el estilo de vida, por ejemplo: Consumir alimentos saludables. Hacer ms ejercicio. Beber menos  alcohol. El mdico puede recetarle medicamentos si los cambios en el estilo de vida no son suficientes para Statistician  presin arterial y si: El nmero de arriba est por encima de 130. El nmero de abajo est por encima de 80. Su presin arterial personal ideal puede variar. Siga estas instrucciones en su casa: Comida y bebida  Si se lo dicen, siga el plan de alimentacin de DASH (Dietary Approaches to Stop Hypertension, Maneras de alimentarse para detener la hipertensin). Para seguir este plan: Llene la mitad del plato de cada comida con frutas y verduras. Llene un cuarto del plato de cada comida con cereales integrales. Los cereales integrales incluyen pasta integral, arroz integral y pan integral. Coma y beba productos lcteos con bajo contenido de grasa, como leche descremada o yogur bajo en grasas. Llene un cuarto del plato de cada comida con protenas bajas en grasa (magras). Las protenas bajas en grasa incluyen pescado, pollo sin piel, huevos, frijoles y tofu. Evite consumir carne grasa, carne curada y procesada, o pollo con piel. Evite consumir alimentos prehechos o procesados. Consuma menos de 1500 mg de sal por da. No beba alcohol si: El mdico le indica que no lo haga. Est embarazada, puede estar embarazada o est tratando de [place]. Si bebe alcohol: Limite la cantidad que bebe a lo siguiente: De 0 a 1 medida por da para las mujeres. De 0 a 2 medidas por da para los hombres. Est atento a la cantidad de alcohol que hay en las bebidas que toma. En los [place], una medida equivale a una botella de cerveza de 12 oz (355 ml), un vaso de vino de 5 oz (148 ml) o un vaso de una bebida alcohlica de alta graduacin de 1 oz (44 ml). Estilo de vida  Trabaje con su mdico para mantenerse en un peso saludable o para perder peso. Pregntele a su mdico cul es el peso recomendable para usted. Haga al menos 30 minutos de ejercicio la [employer] de  la [place]. Estos pueden incluir caminar, nadar o andar en bicicleta. Realice al menos 30 minutos de ejercicio que fortalezca sus msculos (ejercicios de resistencia) al menos 3 das a la [place]. Estos pueden incluir levantar pesas o hacer Pilates. No consuma ningn producto que contenga nicotina o tabaco, como cigarrillos, cigarrillos electrnicos y tabaco de Higher education careers adviser. Si necesita ayuda para dejar de fumar, consulte al [employer]. Controle su presin arterial en su casa tal como le indic el mdico. Concurra a todas las visitas de seguimiento como se lo haya indicado el mdico. Esto es importante. Medicamentos [employer] de venta libre y los recetados solamente como se lo haya indicado el mdico. Siga cuidadosamente las indicaciones. No omita las dosis de medicamentos para la presin arterial. Los medicamentos pierden eficacia si omite dosis. El hecho de omitir las dosis tambin [place] el riesgo de otros problemas. Pregntele a su mdico a qu efectos secundarios o reacciones a los Careers information officer. Comunquese con un mdico si: Piensa que tiene [place] reaccin a los medicamentos que est tomando. Tiene dolores de cabeza frecuentes (recurrentes). Se siente mareado. Tiene hinchazn en los tobillos. Tiene problemas de visin. Solicite ayuda inmediatamente si: Siente un dolor de cabeza muy intenso. Empieza a sentirse desorientado (confundido). Se siente dbil o adormecido. Siente que va a desmayarse. Tiene un dolor muy intenso en las siguientes zonas: Pecho. Vientre (abdomen). Vomita ms de una vez. Tiene dificultad para respirar. Resumen El trmino hipertensin es otra forma de denominar a la presin arterial elevada. La presin arterial elevada fuerza al corazn a trabajar ms para bombear la sangre. Para  la [employer], una presin arterial normal es menor que 120/80. Las decisiones saludables pueden ayudarle a disminuir su presin arterial. Si no puede  bajar su presin arterial mediante decisiones saludables, es posible que deba tomar medicamentos. Esta informacin no tiene Marine scientist el consejo del mdico. Asegrese de hacerle al mdico cualquier pregunta que tenga. Document Revised: 09/25/2018 Document Reviewed: 09/25/2018 Elsevier Patient Education  2022 [place], MD [facility]

## 2021-10-02 NOTE — Assessment & Plan Note (Signed)
Diet and nutrition discussed.  Continue rosuvastatin 20 mg daily. The 10-year ASCVD risk score (Arnett DK, et al., 2019) is: 8.9%   Values used to calculate the score:     Age: 65 years     Sex: Female     Is Non-Hispanic African American: No     Diabetic: No     Tobacco smoker: No     Systolic Blood Pressure: 492 mmHg     Is BP treated: Yes     HDL Cholesterol: 55 mg/dL     Total Cholesterol: 190 mg/dL

## 2021-10-02 NOTE — Assessment & Plan Note (Signed)
Recent visit with GI doctor reviewed.  Scheduled for upper endoscopy and colonoscopy.

## 2021-10-02 NOTE — Assessment & Plan Note (Signed)
Elevated blood pressure readings here in the office and at home. Recommend to continue lisinopril at 40 mg daily and increase metoprolol succinate to 50 mg daily.  Continue monitoring blood pressure readings at home. Dietary approaches to stop hypertension discussed. Follow-up in 3 months.  Sooner as needed.

## 2021-10-02 NOTE — Patient Instructions (Signed)
Hipertensi?n en los adultos ?Hypertension, Adult ?El t?rmino hipertensi?n es otra forma de denominar a la presi?n arterial elevada. La presi?n arterial elevada fuerza al coraz?n a trabajar m?s para bombear la sangre. Esto puede causar problemas con el paso del tiempo. ?Una lectura de presi?n arterial est? compuesta por 2 n?meros. Hay un n?mero superior (sist?lico) sobre un n?mero inferior (diast?lico). Lo ideal es tener la presi?n arterial por debajo de 120/80. Las elecciones saludables pueden ayudar a bajar la presi?n arterial, o tal vez necesite medicamentos para bajarla. ??Cu?les son las causas? ?Se desconoce la causa de esta afecci?n. Algunas afecciones pueden estar relacionadas con la presi?n arterial alta. ??Qu? incrementa el riesgo? ?Fumar. ?Tener diabetes mellitus tipo 2, colesterol alto, o ambos. ?No hacer la cantidad suficiente de actividad f?sica o ejercicio. ?Tener sobrepeso. ?Consumir mucha grasa, az?car, calor?as o sal (sodio) en su dieta. ?Beber alcohol en exceso. ?Tener una enfermedad renal a largo plazo (cr?nica). ?Tener antecedentes familiares de presi?n arterial alta. ?Edad. Los riesgos aumentan con la edad. ?Raza. El riesgo es mayor para las personas afroamericanas. ?Sexo. Antes de los 45 a?os, los hombres corren m?s riesgo que las mujeres. Despu?s de los 65 a?os, las mujeres corren m?s riesgo que los hombres. ?Tener apnea obstructiva del sue?o. ?Estr?s. ??Cu?les son los signos o los s?ntomas? ?Es posible que la presi?n arterial alta puede no cause s?ntomas. La presi?n arterial muy alta (crisis hipertensiva) puede provocar: ?Dolor de cabeza. ?Sensaciones de preocupaci?n o nerviosismo (ansiedad). ?Falta de aire. ?Hemorragia nasal. ?Sensaci?n de malestar en el est?mago (n?useas). ?V?mitos. ?Cambios en la forma de ver. ?Dolor muy intenso en el pecho. ?Convulsiones. ??C?mo se trata? ?Esta afecci?n se trata haciendo cambios saludables en el estilo de vida, por ejemplo: ?Consumir alimentos  saludables. ?Hacer m?s ejercicio. ?Beber menos alcohol. ?El m?dico puede recetarle medicamentos si los cambios en el estilo de vida no son suficientes para lograr controlar la presi?n arterial y si: ?El n?mero de arriba est? por encima de 130. ?El n?mero de abajo est? por encima de 80. ?Su presi?n arterial personal ideal puede variar. ?Siga estas instrucciones en su casa: ?Comida y bebida ? ?Si se lo dicen, siga el plan de alimentaci?n de DASH (Dietary Approaches to Stop Hypertension, Maneras de alimentarse para detener la hipertensi?n). Para seguir este plan: ?Llene la mitad del plato de cada comida con frutas y verduras. ?Llene un cuarto del plato de cada comida con cereales integrales. Los cereales integrales incluyen pasta integral, arroz integral y pan integral. ?Coma y beba productos l?cteos con bajo contenido de grasa, como leche descremada o yogur bajo en grasas. ?Llene un cuarto del plato de cada comida con prote?nas bajas en grasa (magras). Las prote?nas bajas en grasa incluyen pescado, pollo sin piel, huevos, frijoles y tofu. ?Evite consumir carne grasa, carne curada y procesada, o pollo con piel. ?Evite consumir alimentos prehechos o procesados. ?Consuma menos de 1500 mg de sal por d?a. ?No beba alcohol si: ?El m?dico le indica que no lo haga. ?Est? embarazada, puede estar embarazada o est? tratando de quedar embarazada. ?Si bebe alcohol: ?Limite la cantidad que bebe a lo siguiente: ?De 0 a 1 medida por d?a para las mujeres. ?De 0 a 2 medidas por d?a para los hombres. ?Est? atento a la cantidad de alcohol que hay en las bebidas que toma. En los [place], una medida equivale a una botella de cerveza de 12 oz (355 ml), un vaso de vino de 5 oz (148 ml) o un vaso de una bebida alcoh?lica de   alta graduaci?n de 1? oz (44 ml). ?Estilo de vida ? ?Trabaje con su m?dico para mantenerse en un peso saludable o para perder peso. Preg?ntele a su m?dico cu?l es el peso recomendable para usted. ?Haga al menos 30  minutos de ejercicio la mayor?a de los d?as de la semana. Estos pueden incluir caminar, nadar o andar en bicicleta. ?Realice al menos 30 minutos de ejercicio que fortalezca sus m?sculos (ejercicios de resistencia) al menos 3 d?as a la semana. Estos pueden incluir levantar pesas o hacer Pilates. ?No consuma ning?n producto que contenga nicotina o tabaco, como cigarrillos, cigarrillos electr?nicos y tabaco de mascar. Si necesita ayuda para dejar de fumar, consulte al m?dico. ?Controle su presi?n arterial en su casa tal como le indic? el m?dico. ?Concurra a todas las visitas de seguimiento como se lo haya indicado el m?dico. Esto es importante. ?Medicamentos ?Tome los medicamentos de venta libre y los recetados solamente como se lo haya indicado el m?dico. Siga cuidadosamente las indicaciones. ?No omita las dosis de medicamentos para la presi?n arterial. Los medicamentos pierden eficacia si omite dosis. El hecho de omitir las dosis tambi?n aumenta el riesgo de otros problemas. ?Preg?ntele a su m?dico a qu? efectos secundarios o reacciones a los medicamentos debe prestar atenci?n. ?Comun?quese con un m?dico si: ?Piensa que tiene una reacci?n a los medicamentos que est? tomando. ?Tiene dolores de cabeza frecuentes (recurrentes). ?Se siente mareado. ?Tiene hinchaz?n en los tobillos. ?Tiene problemas de visi?n. ?Solicite ayuda inmediatamente si: ?Siente un dolor de cabeza muy intenso. ?Empieza a sentirse desorientado (confundido). ?Se siente d?bil o adormecido. ?Siente que va a desmayarse. ?Tiene un dolor muy intenso en las siguientes zonas: ?Pecho. ?Vientre (abdomen). ?Vomita m?s de una vez. ?Tiene dificultad para respirar. ?Resumen ?El t?rmino hipertensi?n es otra forma de denominar a la presi?n arterial elevada. ?La presi?n arterial elevada fuerza al coraz?n a trabajar m?s para bombear la sangre. ?Para la mayor?a de las personas, una presi?n arterial normal es menor que 120/80. ?Las decisiones saludables pueden ayudarle  a disminuir su presi?n arterial. Si no puede bajar su presi?n arterial mediante decisiones saludables, es posible que deba tomar medicamentos. ?Esta informaci?n no tiene como fin reemplazar el consejo del m?dico. Aseg?rese de hacerle al m?dico cualquier pregunta que tenga. ?Document Revised: 09/25/2018 Document Reviewed: 09/25/2018 ?Elsevier Patient Education ? 2022 Elsevier Inc. ? ?

## 2021-10-16 ENCOUNTER — Encounter: Payer: Managed Care, Other (non HMO) | Admitting: Gastroenterology

## 2021-11-19 ENCOUNTER — Other Ambulatory Visit: Payer: Self-pay | Admitting: Emergency Medicine

## 2021-11-26 ENCOUNTER — Other Ambulatory Visit: Payer: Self-pay | Admitting: Emergency Medicine

## 2021-11-26 DIAGNOSIS — F418 Other specified anxiety disorders: Secondary | ICD-10-CM

## 2021-11-26 NOTE — Progress Notes (Signed)
Cardiology Office Note   Date:  11/28/2021   ID:  Onesti, Bonfiglio Apr 11, 1956, MRN [id number]  PCP:  Horald Pollen, MD  Cardiologist:   Minus Breeding, MD  Referring:  Horald Pollen, MD  Chief Complaint  Patient presents with   Hypertension       History of Present Illness: Emily [name] is a 65 y.o. female who is referred by Horald Pollen, MD for evaluation of low BP. She was seen by Dr. Meda Coffee in 2016 for chest pain.  She had a negative stress test.  She had HTN but seemed to be sensitive to meds.   She was sent back to the clinic after last having seen her in 2019.  I see that she was in the emergency room in October with some hypertensive urgency.  I reviewed these records for this visit.  Since then she has had her meds changed to include discontinuation of Procardia.  She was started instead on lisinopril.  Metoprolol was increased from 25-50.  The entire visit was done through an interpreter.  She actually states she is felt well since then.  She thinks her blood pressures have been well controlled although occasionally will drop down.  She remains active.  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    The patient denies any new symptoms such as chest discomfort, neck or arm discomfort. There has been no new shortness of breath, PND or orthopnea. There have been no reported palpitations, presyncope or syncope.   Past Medical History:  Diagnosis Date   Abdominal pain, chronic, epigastric 11/02/2015   Started amitriptyline at hospitalization 04/13/2016 EGD x 2 negative Also w/ chronic chest wall and back pain Would try not to work up further    Anxiety    Arthritis    Beta thalassemia minor    diagnosed by hematology Dr. Jennette Kettle, also with sickle cell trait   Chronic high back pain 09/24/2015   Colon polyps    GERD (gastroesophageal reflux disease)    Hyperlipidemia    Hypertension    Sickle cell  trait ([place])    diagnosed by hematology Dr. Lindi Adie, coexsisting with beta-thalassemia minor    Past Surgical History:  Procedure Laterality Date   ABDOMINAL HYSTERECTOMY     COLONOSCOPY     ESOPHAGOGASTRODUODENOSCOPY     ESOPHAGOGASTRODUODENOSCOPY [name] 04/13/2016   Procedure: ESOPHAGOGASTRODUODENOSCOPY (EGD);  Surgeon: Gatha Mayer, MD;  Location: [facility];  Service: Endoscopy;  Laterality: [name];     Current Outpatient Medications  Medication Sig Dispense Refill   ALPRAZolam (XANAX) 0.5 MG tablet Take 1 tablet (0.5 mg total) by mouth daily as needed for anxiety. 30 tablet 1   CALCIUM PO Take 1 tablet by mouth daily.      lisinopril (ZESTRIL) 40 MG tablet Take 1 tablet (40 mg total) by mouth daily. 90 tablet 3   metoprolol succinate (TOPROL-XL) 50 MG 24 hr tablet Take 1 tablet (50 mg total) by mouth daily. Take with or immediately following a meal. 90 tablet 3   omeprazole (PRILOSEC) 40 MG capsule TAKE 1 CAPSULE BY MOUTH EVERY DAY 90 capsule 1   rosuvastatin (CRESTOR) 20 MG tablet Take 1 tablet (20 mg total) by mouth daily. 90 tablet 3   No current facility-administered medications for this visit.    Allergies:   Ibuprofen, Aspirin, Atorvastatin, and Nsaids    ROS:  Please see the history of present  illness.   Otherwise, review of systems are positive for none.   All other systems are reviewed and negative.    PHYSICAL EXAM: VS:  BP 136/74   Pulse 72   Ht 5\' 6"  (1.676 m)   Wt 189 lb (85.7 kg)   SpO2 98%   BMI 30.51 kg/m  , BMI Body mass index is 30.51 kg/m. GENERAL:  Well appearing NECK:  No jugular venous distention, waveform within normal limits, carotid upstroke brisk and symmetric, no bruits, no thyromegaly LUNGS:  Clear to auscultation bilaterally CHEST:  Unremarkable HEART:  PMI not displaced or sustained,S1 and S2 within normal limits, no S3, no S4, no clicks, no rubs, no murmurs ABD:  Flat, positive bowel sounds normal in frequency in pitch, no bruits, no  rebound, no guarding, no midline pulsatile mass, no hepatomegaly, no splenomegaly EXT:  2 plus pulses throughout, no edema, no cyanosis no clubbing  EKG:  EKG is not ordered today. The ekg ordered 09/27/21 demonstrates sinus rhythm, rate 71, axis within normal limits, no acute ST-T wave changes.   Recent Labs: 09/27/2021: ALT 24; BUN 12; Creatinine, Ser 0.50; Hemoglobin 11.9; Platelets 265; Potassium 4.2; Sodium 146    Lipid Panel    Component Value Date/Time   CHOL 190 05/02/2021 1307   TRIG 153 (H) 05/02/2021 1307   HDL 55 05/02/2021 1307   CHOLHDL 3.5 05/02/2021 1307   CHOLHDL 3.4 05/22/2015 1342   VLDL 23 05/22/2015 1342   LDLCALC 108 (H) 05/02/2021 1307   LDLDIRECT 174 (H) 10/02/2012 2025      Wt Readings from Last 3 Encounters:  11/28/21 189 lb (85.7 kg)  10/02/21 199 lb (90.3 kg)  09/29/21 194 lb (88 kg)      Other studies Reviewed: Additional studies/ records that were reviewed today include: ED records Review of the above records demonstrates:  Please see elsewhere in the note.     ASSESSMENT AND PLAN:  HYPERTENSIVE URGENCY:   Her blood pressure seems to be well controlled at this point.  I do not think there is a secondary cause.  I did review the emergency room records.  Her labs are up-to-date.   We had a long discussion about blood pressure management.  She can keep a blood pressure diary.  I will be happy to review this on My Chart as needed.  Of note I did endorse that it was reasonable for her to hold her medications if she is having one of her low blood pressure days.  Current medicines are reviewed at length with the patient today.  The patient does not have concerns regarding medicines.  The following changes have been made:  None  Labs/ tests ordered today include: None No orders of the defined types were placed in this encounter.    Disposition:   FU with me as needed.     Signed, Minus Breeding, MD  11/28/2021 5:29 PM    [facility]

## 2021-11-27 ENCOUNTER — Other Ambulatory Visit: Payer: Self-pay | Admitting: Emergency Medicine

## 2021-11-27 DIAGNOSIS — F418 Other specified anxiety disorders: Secondary | ICD-10-CM

## 2021-11-27 MED ORDER — ALPRAZOLAM 0.5 MG PO TABS: 0.5000 mg | ORAL_TABLET | Freq: Every day | ORAL | 1 refills | Status: DC | PRN

## 2021-11-28 ENCOUNTER — Encounter: Payer: Self-pay | Admitting: Cardiology

## 2021-11-28 ENCOUNTER — Other Ambulatory Visit: Payer: Self-pay

## 2021-11-28 ENCOUNTER — Ambulatory Visit: Payer: Managed Care, Other (non HMO) | Admitting: Cardiology

## 2021-11-28 VITALS — BP 136/74 | HR 72 | Ht 66.0 in | Wt 189.0 lb

## 2021-11-28 DIAGNOSIS — I959 Hypotension, unspecified: Secondary | ICD-10-CM | POA: Diagnosis not present

## 2021-11-28 DIAGNOSIS — I160 Hypertensive urgency: Secondary | ICD-10-CM | POA: Diagnosis not present

## 2021-11-28 NOTE — Patient Instructions (Signed)
Medication Instructions:  Your Physician recommend you continue on your current medication as directed.    *If you need a refill on your cardiac medications before your next appointment, please call your pharmacy*    Follow-Up: At [facility], you and your health needs are our priority.  As part of our continuing mission to provide you with exceptional heart care, we have created designated Provider Care Teams.  These Care Teams include your primary Cardiologist (physician) and Advanced Practice Providers (APPs -  Physician Assistants and Nurse Practitioners) who all work together to provide you with the care you need, when you need it.  We recommend signing up for the patient portal called "MyChart".  Sign up information is provided on this After Visit Summary.  MyChart is used to connect with patients for Virtual Visits (Telemedicine).  Patients are able to view lab/test results, encounter notes, upcoming appointments, etc.  Non-urgent messages can be sent to your provider as well.   To learn more about what you can do with MyChart, go to [email or web address].    Your next appointment:    As needed.  The format for your next appointment:   In Person  Provider:   Minus Breeding, MD

## 2022-01-23 ENCOUNTER — Telehealth: Payer: Self-pay | Admitting: Gastroenterology

## 2022-01-23 NOTE — Telephone Encounter (Signed)
Hi Dr. Ardis Hughs, this pt called to schedule an EGD and colon. She saw you in clinic on 09/29/21. Is it ok to schedule her for direct procedures or does she need another ov first? Thank you.

## 2022-01-24 ENCOUNTER — Encounter: Payer: Self-pay | Admitting: Gastroenterology

## 2022-01-24 NOTE — Telephone Encounter (Signed)
Direct endocolon scheduled on 03/13/22 at 10:30am. PV on 02/27/22 at 2:30pm.

## 2022-02-12 ENCOUNTER — Ambulatory Visit: Payer: Managed Care, Other (non HMO) | Admitting: Emergency Medicine

## 2022-02-12 ENCOUNTER — Other Ambulatory Visit: Payer: Self-pay

## 2022-02-12 ENCOUNTER — Encounter: Payer: Self-pay | Admitting: Emergency Medicine

## 2022-02-12 VITALS — BP 136/70 | HR 70 | Ht 66.0 in | Wt 186.0 lb

## 2022-02-12 DIAGNOSIS — R7303 Prediabetes: Secondary | ICD-10-CM

## 2022-02-12 DIAGNOSIS — I10 Essential (primary) hypertension: Secondary | ICD-10-CM

## 2022-02-12 DIAGNOSIS — E785 Hyperlipidemia, unspecified: Secondary | ICD-10-CM | POA: Diagnosis not present

## 2022-02-12 DIAGNOSIS — F418 Other specified anxiety disorders: Secondary | ICD-10-CM

## 2022-02-12 LAB — CBC WITH DIFFERENTIAL/PLATELET
Basophils Absolute: 0.1 10*3/uL (ref 0.0–0.1)
Basophils Relative: 0.7 % (ref 0.0–3.0)
Eosinophils Absolute: 0.2 10*3/uL (ref 0.0–0.7)
Eosinophils Relative: 3.1 % (ref 0.0–5.0)
HCT: 35.9 % — ABNORMAL LOW (ref 36.0–46.0)
Hemoglobin: 11.8 g/dL — ABNORMAL LOW (ref 12.0–15.0)
Lymphocytes Relative: 32.7 % (ref 12.0–46.0)
Lymphs Abs: 2.4 10*3/uL (ref 0.7–4.0)
MCHC: 32.9 g/dL (ref 30.0–36.0)
MCV: 75.6 fl — ABNORMAL LOW (ref 78.0–100.0)
Monocytes Absolute: 0.5 10*3/uL (ref 0.1–1.0)
Monocytes Relative: 6.5 % (ref 3.0–12.0)
Neutro Abs: 4.2 10*3/uL (ref 1.4–7.7)
Neutrophils Relative %: 57 % (ref 43.0–77.0)
Platelets: 251 10*3/uL (ref 150.0–400.0)
RBC: 4.75 Mil/uL (ref 3.87–5.11)
RDW: 15.1 % (ref 11.5–15.5)
WBC: 7.4 10*3/uL (ref 4.0–10.5)

## 2022-02-12 LAB — COMPREHENSIVE METABOLIC PANEL
ALT: 18 U/L (ref 0–35)
AST: 18 U/L (ref 0–37)
Albumin: 4.2 g/dL (ref 3.5–5.2)
Alkaline Phosphatase: 88 U/L (ref 39–117)
BUN: 19 mg/dL (ref 6–23)
CO2: 35 mEq/L — ABNORMAL HIGH (ref 19–32)
Calcium: 10.2 mg/dL (ref 8.4–10.5)
Chloride: 101 mEq/L (ref 96–112)
Creatinine, Ser: 0.73 mg/dL (ref 0.40–1.20)
GFR: 86.9 mL/min (ref >60.00)
Glucose, Bld: 90 mg/dL (ref 70–99)
Potassium: 3.4 mEq/L — ABNORMAL LOW (ref 3.5–5.1)
Sodium: 138 mEq/L (ref 135–145)
Total Bilirubin: 0.4 mg/dL (ref 0.2–1.2)
Total Protein: 7.8 g/dL (ref 6.0–8.3)

## 2022-02-12 LAB — LIPID PANEL
Cholesterol: 153 mg/dL (ref 0–200)
HDL: 45.8 mg/dL (ref >39.00)
LDL Cholesterol: 85 mg/dL (ref 0–99)
NonHDL: 107.51
Total CHOL/HDL Ratio: 3
Triglycerides: 113 mg/dL (ref 0.0–149.0)
VLDL: 22.6 mg/dL (ref 0.0–40.0)

## 2022-02-12 LAB — HEMOGLOBIN A1C: Hgb A1c MFr Bld: 6 % (ref 4.6–6.5)

## 2022-02-12 MED ORDER — LISINOPRIL 40 MG PO TABS: 40.0000 mg | ORAL_TABLET | Freq: Every day | ORAL | 3 refills | Status: DC

## 2022-02-12 MED ORDER — ALPRAZOLAM 0.5 MG PO TABS: 0.5000 mg | ORAL_TABLET | Freq: Every day | ORAL | 1 refills | Status: DC | PRN

## 2022-02-12 NOTE — Patient Instructions (Signed)

## 2022-02-12 NOTE — Assessment & Plan Note (Signed)
Stable.  Continue rosuvastatin 20 mg daily.  Diet and nutrition discussed. The 10-year ASCVD risk score (Arnett DK, et al., 2019) is: 7.3%   Values used to calculate the score:     Age: 66 years     Sex: Female     Is Non-Hispanic African American: No     Diabetic: No     Tobacco smoker: No     Systolic Blood Pressure: 680 mmHg     Is BP treated: Yes     HDL Cholesterol: 55 mg/dL     Total Cholesterol: 190 mg/dL

## 2022-02-12 NOTE — Assessment & Plan Note (Signed)
Well-controlled hypertension with normal blood pressure readings at home. Continue lisinopril 40 mg and metoprolol succinate 50 mg daily. Dietary approaches to stop hypertension discussed.

## 2022-02-12 NOTE — Progress Notes (Signed)
Emily [name] 66 y.o.   Chief Complaint  Patient presents with   Hypertension    F/U, med refill of xanax and lisinopril    HISTORY OF PRESENT ILLNESS: This is a 66 y.o. female with history of hypertension here for follow-up. Stable with normal blood pressure readings at home. Also takes Xanax as needed sparingly. Has intermittent bouts of constipation and diarrhea. Family history of colon cancer.  Scheduled for colonoscopy next March. BP Readings from Last 3 Encounters:  02/12/22 136/70  11/28/21 136/74  10/02/21 (!) 150/80     Hypertension Pertinent negatives include no chest pain, headaches, malaise/fatigue, palpitations or shortness of breath.    Prior to Admission medications   Medication Sig Start Date End Date Taking? Authorizing Provider  ALPRAZolam Duanne Moron) 0.5 MG tablet Take 1 tablet (0.5 mg total) by mouth daily as needed for anxiety. 11/27/21  Yes Pamella Samons, Ines Bloomer, MD  CALCIUM PO Take 1 tablet by mouth daily.    Yes [provider]  lisinopril (ZESTRIL) 40 MG tablet Take 1 tablet (40 mg total) by mouth daily. 06/20/21  Yes Britteny Fiebelkorn, Ines Bloomer, MD  metoprolol succinate (TOPROL-XL) 50 MG 24 hr tablet Take 1 tablet (50 mg total) by mouth daily. Take with or immediately following a meal. 10/02/21  Yes Joliyah Lippens, Ines Bloomer, MD  omeprazole (PRILOSEC) 40 MG capsule TAKE 1 CAPSULE BY MOUTH EVERY DAY 06/20/21  Yes Calissa Swenor, Ines Bloomer, MD  rosuvastatin (CRESTOR) 20 MG tablet Take 1 tablet (20 mg total) by mouth daily. 06/20/21  Yes Horald Pollen, MD    Allergies  Allergen Reactions   Ibuprofen    Aspirin Hives   Atorvastatin     Yellow fingernails   Nsaids Hives    Patient Active Problem List   Diagnosis Date Noted   Hypertensive urgency 11/28/2021   History of arthritis 08/15/2020   History of gastroesophageal reflux (GERD) 08/15/2020   Chronic bilateral low back pain without sciatica 06/30/2019   Carotid artery disease ([place])  12/19/2018   Bilateral carotid artery stenosis, R- 40-59%, L 1-39% 11/2017 03/25/2018   Dyslipidemia 03/25/2018   GERD (gastroesophageal reflux disease) 04/12/2016   Dyspepsia 11/02/2015   Chronic high back pain 09/24/2015   Essential hypertension 10/03/2012   Hyperlipidemia 10/03/2012   DYSPEPSIA&OTHER SPEC DISORDERS FUNCTION STOMACH 07/20/2009    Past Medical History:  Diagnosis Date   Abdominal pain, chronic, epigastric 11/02/2015   Started amitriptyline at hospitalization 04/13/2016 EGD x 2 negative Also w/ chronic chest wall and back pain Would try not to work up further    Anxiety    Arthritis    Beta thalassemia minor    diagnosed by hematology Dr. Jennette Kettle, also with sickle cell trait   Chronic high back pain 09/24/2015   Colon polyps    GERD (gastroesophageal reflux disease)    Hyperlipidemia    Hypertension    Sickle cell trait ([facility])    diagnosed by hematology Dr. Lindi Adie, coexsisting with beta-thalassemia minor    Past Surgical History:  Procedure Laterality Date   ABDOMINAL HYSTERECTOMY     COLONOSCOPY     ESOPHAGOGASTRODUODENOSCOPY     ESOPHAGOGASTRODUODENOSCOPY [name] 04/13/2016   Procedure: ESOPHAGOGASTRODUODENOSCOPY (EGD);  Surgeon: Gatha Mayer, MD;  Location: [facility];  Service: Endoscopy;  Laterality: [name];    Social History   Socioeconomic History   Marital status: Married    Spouse name: Not on file   Number of children: 3   Years of education: Not on file  Highest education level: Not on file  Occupational History   Not on file  Tobacco Use   Smoking status: Never   Smokeless tobacco: Never  Vaping Use   Vaping Use: Never used  Substance and Sexual Activity   Alcohol use: No    Alcohol/week: 0.0 standard drinks   Drug use: No   Sexual activity: Yes    Comment: 1st intercourse- 30, partner-1, married- 34 yrs   Other Topics Concern   Not on file  Social History Narrative   Lives with husband and works at [employer] Strain: Not on file  Food Insecurity: Not on file  Transportation Needs: Not on file  Physical Activity: Not on file  Stress: Not on file  Social Connections: Not on file  Intimate Partner Violence: Not on file    Family History  Problem Relation Age of Onset   Aneurysm Mother    Diabetes Father    Colon cancer Brother 28   Liver cancer Brother    Esophageal cancer Neg Hx    Stomach cancer Neg Hx    Pancreatic cancer Neg Hx      Review of Systems  Constitutional: Negative.  Negative for chills, fever and malaise/fatigue.  HENT: Negative.  Negative for congestion and sore throat.   Respiratory: Negative.  Negative for cough and shortness of breath.   Cardiovascular: Negative.  Negative for chest pain and palpitations.  Gastrointestinal:  Positive for constipation and diarrhea. Negative for abdominal pain, blood in stool, melena, nausea and vomiting.  Genitourinary: Negative.  Negative for dysuria and hematuria.  Skin: Negative.  Negative for rash.  Neurological: Negative.  Negative for dizziness and headaches.  All other systems reviewed and are negative.  Today's Vitals   02/12/22 1109  BP: 136/70  Pulse: 70  SpO2: 95%  Weight: 186 lb (84.4 kg)  Height: 5\' 6"  (1.676 m)   Body mass index is 30.02 kg/m. Wt Readings from Last 3 Encounters:  02/12/22 186 lb (84.4 kg)  11/28/21 189 lb (85.7 kg)  10/02/21 199 lb (90.3 kg)    Physical Exam Vitals reviewed.  Constitutional:      Appearance: Normal appearance.  HENT:     Head: Normocephalic.  Eyes:     Extraocular Movements: Extraocular movements intact.     Pupils: Pupils are equal, round, and reactive to light.  Cardiovascular:     Rate and Rhythm: Normal rate and regular rhythm.     Pulses: Normal pulses.     Heart sounds: Normal heart sounds.  Pulmonary:     Effort: Pulmonary effort is normal.     Breath sounds: Normal breath sounds.  Musculoskeletal:      Cervical back: No tenderness.     Right lower leg: No edema.     Left lower leg: No edema.  Lymphadenopathy:     Cervical: No cervical adenopathy.  Skin:    General: Skin is warm and dry.     Capillary Refill: Capillary refill takes less than 2 seconds.  Neurological:     General: No focal deficit present.     Mental Status: She is alert and oriented to person, place, and time.  Psychiatric:        Mood and Affect: Mood normal.        Behavior: Behavior normal.     ASSESSMENT & PLAN: Problem List Items Addressed This Visit       Cardiovascular and Mediastinum  Essential hypertension - Primary    Well-controlled hypertension with normal blood pressure readings at home. Continue lisinopril 40 mg and metoprolol succinate 50 mg daily. Dietary approaches to stop hypertension discussed.      Relevant Medications   lisinopril (ZESTRIL) 40 MG tablet   Other Relevant Orders   CBC with Differential/Platelet   Comprehensive metabolic panel     Other   Dyslipidemia    Stable.  Continue rosuvastatin 20 mg daily.  Diet and nutrition discussed. The 10-year ASCVD risk score (Arnett DK, et al., 2019) is: 7.3%   Values used to calculate the score:     Age: 61 years     Sex: Female     Is Non-Hispanic African American: No     Diabetic: No     Tobacco smoker: No     Systolic Blood Pressure: 315 mmHg     Is BP treated: Yes     HDL Cholesterol: 55 mg/dL     Total Cholesterol: 190 mg/dL       Relevant Orders   CBC with Differential/Platelet   Lipid panel   Prediabetes    Diet and nutrition discussed.  Advised to decrease amount of daily carbohydrate intake.      Relevant Orders   CBC with Differential/Platelet   Hemoglobin A1c   Other Visit Diagnoses     Situational anxiety       Relevant Medications   ALPRAZolam (XANAX) 0.5 MG tablet      Patient Instructions  Mantenimiento de la salud en [place] Maintenance, Female Adoptar un estilo de vida saludable y  recibir atencin preventiva son importantes para promover la salud y Musician. Consulte al mdico sobre: El esquema adecuado para hacerse pruebas y exmenes peridicos. Cosas que puede hacer por su cuenta para prevenir enfermedades y Rena Lara sano. Qu debo saber sobre la dieta, el peso y el ejercicio? Consuma una dieta saludable  Consuma una dieta que incluya muchas verduras, frutas, productos lcteos con bajo contenido de [place] y Advertising account planner. No consuma muchos alimentos ricos en grasas slidas, azcares agregados o sodio. Mantenga un peso saludable El ndice de masa muscular [facility]) se [place] para identificar problemas de [place]. Proporciona una estimacin de la grasa corporal basndose en el peso y la altura. Su mdico puede ayudarle a Radiation protection practitioner [place] y a Scientist, forensic o Theatre manager un peso saludable. Haga ejercicio con regularidad Haga ejercicio con regularidad. Esta es una de las prcticas ms importantes que puede hacer por su salud. La [employer] de los adultos deben seguir estas pautas: Optometrist, al menos, 150 minutos de actividad fsica por semana. El ejercicio debe aumentar la frecuencia cardaca y Nature conservation officer transpirar (ejercicio de intensidad moderada). Hacer ejercicios de fortalecimiento por lo [employer] por semana. Agregue esto a su plan de ejercicio de intensidad moderada. Pase menos tiempo sentada. Incluso la actividad fsica ligera puede ser beneficiosa. Controle sus niveles de colesterol y lpidos en la sangre Comience a realizarse anlisis de lpidos y Research officer, trade union en la sangre a los 39 aos y luego reptalos cada 5 aos. Hgase controlar los niveles de colesterol con mayor frecuencia si: Sus niveles de lpidos y colesterol son altos. Es mayor de 62 aos. Presenta un alto riesgo de padecer enfermedades cardacas. Qu debo saber sobre las pruebas de deteccin del cncer? Segn su historia clnica y sus antecedentes familiares, es posible que deba realizarse pruebas de  deteccin del cncer en diferentes edades. Esto puede incluir pruebas de deteccin de lo siguiente: Cncer de  mama. Cncer de cuello uterino. Cncer colorrectal. Cncer de piel. Cncer de pulmn. Qu debo saber sobre la enfermedad cardaca, la diabetes y la hipertensin arterial? Presin arterial y enfermedad cardaca La hipertensin arterial causa enfermedades cardacas y [place] el riesgo de accidente cerebrovascular. Es ms probable que esto se manifieste en las personas que tienen lecturas de presin arterial alta o tienen sobrepeso. Hgase controlar la presin arterial: Cada 3 a 5 aos si tiene entre 18 y 22 aos. Todos los aos si es mayor de 40 aos. Diabetes Realcese exmenes de deteccin de la diabetes con regularidad. Este anlisis revisa el nivel de azcar en la sangre en [place]. Hgase las pruebas de deteccin: Cada tres aos despus de los 47 aos de edad si tiene un peso normal y un bajo riesgo de padecer diabetes. Con ms frecuencia y a partir de [place] edad inferior si tiene sobrepeso o un alto riesgo de padecer diabetes. Qu debo saber sobre la prevencin de infecciones? Hepatitis B Si tiene un riesgo ms alto de contraer hepatitis B, debe someterse a un examen de deteccin de este virus. Hable con el mdico para averiguar si tiene riesgo de contraer la infeccin por hepatitis B. Hepatitis C Se recomienda el anlisis a: [employer] 1945 y 1965. Todas las personas que tengan un riesgo de haber contrado hepatitis C. Enfermedades de transmisin sexual (ETS) Hgase las pruebas de Programme researcher, broadcasting/film/video de ITS, incluidas la gonorrea y la clamidia, si: Es sexualmente activa y es menor de 51 aos. Es mayor de 33 aos, y Investment banker, operational informa que corre riesgo de tener este tipo de infecciones. La actividad sexual ha cambiado desde que le hicieron la ltima prueba de deteccin y tiene un riesgo mayor de Best boy clamidia o Radio broadcast assistant. Pregntele al mdico si usted tiene  riesgo. Pregntele al mdico si usted tiene un alto riesgo de Museum/gallery curator VIH. El mdico tambin puede recomendarle un medicamento recetado para ayudar a evitar la infeccin por el VIH. Si elige tomar medicamentos para prevenir el VIH, primero debe [employer] de deteccin del VIH. Luego debe hacerse anlisis cada 3 meses mientras est tomando los medicamentos. Embarazo Si est por dejar de Librarian, academic (fase premenopusica) y usted puede quedar [place], busque asesoramiento antes de [place]. Tome de 400 a 800 microgramos (mcg) de cido [employer] si [place]. Pida mtodos de control de la natalidad (anticonceptivos) si desea evitar un embarazo no deseado. Osteoporosis y [place] La osteoporosis es una enfermedad en la que los huesos pierden los minerales y la fuerza por el avance de la edad. El resultado pueden ser fracturas en los [place]. Si tiene 25 aos o ms, o si est en riesgo de sufrir osteoporosis y fracturas, pregunte a su mdico si debe: Hacerse pruebas de deteccin de prdida sea. Tomar un suplemento de calcio o de vitamina D para reducir el riesgo de fracturas. Recibir terapia de reemplazo hormonal (TRH) para tratar los sntomas de la menopausia. Siga estas indicaciones en su casa: Consumo de alcohol No beba alcohol si: Su mdico le indica no hacerlo. Est embarazada, puede estar embarazada o est tratando de [place]. Si bebe alcohol: Limite la cantidad que bebe a lo siguiente: De 0 a 1 bebida por da. Sepa cunta cantidad de alcohol hay en las bebidas que toma. En los [place], una medida equivale a una botella de cerveza de 12 oz (355 ml), un vaso de vino de 5 oz (148 ml) o un vaso de Ardelia Mems  bebida alcohlica de alta graduacin de 1 oz (44 ml). Estilo de vida No consuma ningn producto que contenga nicotina o tabaco. Estos productos incluyen cigarrillos, tabaco para Higher education careers adviser y aparatos de vapeo, como los Psychologist, sport and exercise.  Si necesita ayuda para dejar de consumir estos productos, consulte al mdico. No consuma drogas. No comparta agujas. Solicite ayuda a su mdico si necesita apoyo o informacin para abandonar las drogas. Indicaciones generales Realcese los estudios de rutina de la salud, dentales y de Public librarian. [place]. Infrmele a su mdico si: Se siente deprimida con frecuencia. Alguna vez ha sido vctima de [place] o no se siente seguro en su casa. Resumen Adoptar un estilo de vida saludable y recibir atencin preventiva son importantes para promover la salud y Musician. Siga las instrucciones del mdico acerca de una dieta saludable, el ejercicio y la realizacin de pruebas o exmenes para Engineer, building services. Siga las instrucciones del mdico con respecto al control del colesterol y la presin arterial. Esta informacin no tiene Marine scientist el consejo del mdico. Asegrese de hacerle al mdico cualquier pregunta que tenga. Document Revised: 05/18/2021 Document Reviewed: 05/18/2021 Elsevier Patient Education  2022 [place], MD [facility]

## 2022-02-12 NOTE — Assessment & Plan Note (Signed)
Diet and nutrition discussed.  Advised to decrease amount of daily carbohydrate intake. 

## 2022-02-27 ENCOUNTER — Ambulatory Visit (AMBULATORY_SURGERY_CENTER): Payer: Self-pay | Admitting: *Deleted

## 2022-02-27 ENCOUNTER — Other Ambulatory Visit: Payer: Self-pay

## 2022-02-27 VITALS — Ht 66.0 in | Wt 188.0 lb

## 2022-02-27 DIAGNOSIS — Z8719 Personal history of other diseases of the digestive system: Secondary | ICD-10-CM

## 2022-02-27 DIAGNOSIS — Z1211 Encounter for screening for malignant neoplasm of colon: Secondary | ICD-10-CM

## 2022-02-27 NOTE — Progress Notes (Signed)
No egg or soy allergy known to patient  ?No issues known to pt with past sedation with any surgeries or procedures ?Patient denies ever being told they had issues or difficulty with intubation  ?No FH of Malignant Hyperthermia ?Pt is not on diet pills ?Pt is not on  home 02  ?Pt is not on blood thinners  ?Pt denies issues with constipation  ?No A fib or A flutter ? ?Pt is  vaccinated  for Covid  ? ?Due to the COVID-19 pandemic we are asking patients to follow certain guidelines in PV and the [place]   ?Pt aware of COVID protocols and [facility] guidelines  ? ?PV completed over the phone. Pt verified name, DOB, address and insurance during PV today.  ?Pt mailed instruction packet with copy of consent form to read and not return, and instructions.  ?Pt encouraged to call with questions or issues.  ?If pt has My chart, procedure instructions sent via My Chart   ? ?Sample sheet of over the counter items to purchase for prep given to patient and daughter.  ? ?Spanish Interpreter in during pre-visit process. ?

## 2022-03-06 ENCOUNTER — Encounter: Payer: Self-pay | Admitting: Gastroenterology

## 2022-03-13 ENCOUNTER — Other Ambulatory Visit: Payer: Self-pay

## 2022-03-13 ENCOUNTER — Encounter: Payer: Self-pay | Admitting: Gastroenterology

## 2022-03-13 ENCOUNTER — Other Ambulatory Visit: Payer: Self-pay | Admitting: Gastroenterology

## 2022-03-13 ENCOUNTER — Ambulatory Visit (AMBULATORY_SURGERY_CENTER): Payer: Managed Care, Other (non HMO) | Admitting: Gastroenterology

## 2022-03-13 VITALS — BP 111/69 | HR 54 | Temp 98.9°F | Resp 10 | Ht 66.0 in | Wt 188.0 lb

## 2022-03-13 DIAGNOSIS — R1310 Dysphagia, unspecified: Secondary | ICD-10-CM | POA: Diagnosis not present

## 2022-03-13 DIAGNOSIS — Z8719 Personal history of other diseases of the digestive system: Secondary | ICD-10-CM

## 2022-03-13 DIAGNOSIS — D128 Benign neoplasm of rectum: Secondary | ICD-10-CM | POA: Diagnosis not present

## 2022-03-13 DIAGNOSIS — Z86010 Personal history of colonic polyps: Secondary | ICD-10-CM

## 2022-03-13 DIAGNOSIS — Z1211 Encounter for screening for malignant neoplasm of colon: Secondary | ICD-10-CM

## 2022-03-13 DIAGNOSIS — K449 Diaphragmatic hernia without obstruction or gangrene: Secondary | ICD-10-CM | POA: Diagnosis not present

## 2022-03-13 DIAGNOSIS — D122 Benign neoplasm of ascending colon: Secondary | ICD-10-CM | POA: Diagnosis not present

## 2022-03-13 MED ORDER — SODIUM CHLORIDE 0.9 % IV SOLN: 500.0000 mL | Freq: Once | INTRAVENOUS | Status: DC

## 2022-03-13 NOTE — Progress Notes (Signed)
Report given to PACU, vss 

## 2022-03-13 NOTE — Progress Notes (Signed)
1010 Robinul 0.1 mg IV given due large amount of secretions upon assessment.  MD made aware, vss 

## 2022-03-13 NOTE — Progress Notes (Signed)
HPI: ?This is a woman with FH of CRC, dry mount, dysphagia ? ?EGD and colonoscopy today ? ? ?ROS: complete GI ROS as described in HPI, all other review negative. ? ?Constitutional:  No unintentional weight loss ? ? ?Past Medical History:  ?Diagnosis Date  ? Abdominal pain, chronic, epigastric 11/02/2015  ? Started amitriptyline at hospitalization 04/13/2016 EGD x 2 negative Also w/ chronic chest wall and back pain Would try not to work up further   ? Anxiety   ? Arthritis   ? BACK,KNEES  ? Beta thalassemia minor   ? diagnosed by hematology Dr. Jennette Kettle, also with sickle cell trait  ? Blood transfusion without reported diagnosis   ? "LONG TIME AGO"  ? Chronic high back pain 09/24/2015  ? Colon polyps   ? GERD (gastroesophageal reflux disease)   ? Heart murmur   ? "SMALL DOESN'T BOTHER ME"  ? Hyperlipidemia   ? Hypertension   ? Sickle cell trait ([place])   ? diagnosed by hematology Dr. Lindi Adie, coexsisting with beta-thalassemia minor  ? ? ?Past Surgical History:  ?Procedure Laterality Date  ? ABDOMINAL HYSTERECTOMY    ? 1998  ? COLONOSCOPY    ? ESOPHAGOGASTRODUODENOSCOPY    ? ESOPHAGOGASTRODUODENOSCOPY [name] 04/13/2016  ? Procedure: ESOPHAGOGASTRODUODENOSCOPY (EGD);  Surgeon: Gatha Mayer, MD;  Location: [facility];  Service: Endoscopy;  Laterality: [name];  ? ? ?Current Outpatient Medications  ?Medication Sig Dispense Refill  ? ALPRAZolam (XANAX) 0.5 MG tablet Take 1 tablet (0.5 mg total) by mouth daily as needed for anxiety. 30 tablet 1  ? Ascorbic Acid (VITAMIN C) 1000 MG tablet Take 1,000 mg by mouth daily.    ? CALCIUM PO Take 1 tablet by mouth daily.     ? Cholecalciferol (VITAMIN D3 PO) Take 125 mg by mouth daily.    ? Coenzyme Q10 (CO Q-10 PO) Take 18 mg by mouth daily.    ? lisinopril (ZESTRIL) 40 MG tablet Take 1 tablet (40 mg total) by mouth daily. 90 tablet 3  ? metoprolol succinate (TOPROL-XL) 50 MG 24 hr tablet Take 1 tablet (50 mg total) by mouth daily. Take with or immediately following a meal. 90 tablet 3   ? Multiple Vitamin (MULTI-VITAMIN DAILY PO) Take by mouth daily. TAKE 1 TABLET DAILY    ? omeprazole (PRILOSEC) 40 MG capsule TAKE 1 CAPSULE BY MOUTH EVERY DAY 90 capsule 1  ? rosuvastatin (CRESTOR) 20 MG tablet Take 1 tablet (20 mg total) by mouth daily. 90 tablet 3  ? Turmeric (QC TUMERIC COMPLEX PO) Take by mouth daily. NATURAL SUPPLEMENT    ? ?Current Facility-Administered Medications  ?Medication Dose Route Frequency Provider Last Rate Last Admin  ? 0.9 %  sodium chloride infusion  500 mL Intravenous Once Milus Banister, MD      ? ? ?Allergies as of 03/13/2022 - Review Complete 03/13/2022  ?Allergen Reaction Noted  ? Ibuprofen  12/19/2018  ? Aspirin Hives   ? Atorvastatin  01/16/2017  ? Nsaids Hives 07/15/2009  ? ? ?Family History  ?Problem Relation Age of Onset  ? Aneurysm Mother   ? Diabetes Father   ? Colon polyps Brother   ? Colon cancer Brother   ? Liver cancer Brother   ? Colon cancer Nephew   ? Esophageal cancer Neg Hx   ? Stomach cancer Neg Hx   ? Pancreatic cancer Neg Hx   ? Rectal cancer Neg Hx   ? ? ?Social History  ? ?Socioeconomic History  ? Marital  status: Married  ?  Spouse name: Not on file  ? Number of children: 3  ? Years of education: Not on file  ? Highest education level: Not on file  ?Occupational History  ? Not on file  ?Tobacco Use  ? Smoking status: Never  ?  Passive exposure: Never  ? Smokeless tobacco: Never  ?Vaping Use  ? Vaping Use: Never used  ?Substance and Sexual Activity  ? Alcohol use: No  ?  Alcohol/week: 0.0 standard drinks  ? Drug use: No  ? Sexual activity: Yes  ?  Comment: 1st intercourse- 56, partner-1, married- 19 yrs   ?Other Topics Concern  ? Not on file  ?Social History Narrative  ? Lives with husband and works at [employer]  ? ?Social Determinants of Health  ? ?Financial Resource Strain: Not on file  ?Food Insecurity: Not on file  ?Transportation Needs: Not on file  ?Physical Activity: Not on file  ?Stress: Not on file  ?Social Connections: Not on file  ?Intimate  Partner Violence: Not on file  ? ? ? ?Physical Exam: ?BP (!) 151/101   Pulse 68   Temp 98.9 ?F (37.2 ?C)   Ht '5\' 6"'$  (1.676 m)   Wt 188 lb (85.3 kg)   SpO2 98%   BMI 30.34 kg/m?  ?Constitutional: generally well-appearing ?Psychiatric: alert and oriented x3 ?Lungs: CTA bilaterally ?Heart: no MCR ? ?Assessment and plan: ?66 y.o. female with FH CRC, dysphagia, dry mount ? ?Colonoscopy and EGD today ? ?Care is appropriate for the ambulatory setting. ? ?Owens Loffler, MD ?[facility] ?03/13/2022, 9:55 AM ? ? ? ?

## 2022-03-13 NOTE — Patient Instructions (Signed)
Handouts provided on polyps and hiatal hernia.  ? ?Continue present medications. Stay on the pepcid at bedtime every night. This seems to be helping you symptoms.  ? ?USTED Emily [name] PROCEDIMIENTO ENDOSC?[facility] EN [facility]:   Lea el informe del procedimiento que se le entreg? para cualquier pregunta espec?fica sobre lo que se encontr? Education administrator.  Si el informe del examen no responde a sus preguntas, por favor llame a su gastroenter?logo para aclararlo.  Si usted solicit? que no se le den [employer] de lo que se Estate manager/land agent? en su procedimiento al acompa?ante que le va a cuidar, entonces el informe del procedimiento se ha incluido en un sobre sellado para que usted lo revise despu?s cuando le sea m?s conveniente. ?  ?LO QUE PUEDE ESPERAR: Algunas sensaciones de hinchaz?n en el abdomen.  Puede tener m?s gases de lo normal.  El caminar puede ayudarle a eliminar el aire que se le puso en el tracto gastrointestinal durante el procedimiento y reducir la hinchaz?n.  Si le hicieron una endoscopia inferior (como una colonoscopia o una sigmoidoscopia flexible), podr?a notar manchas de sangre en las heces fecales o en el papel higi?nico.  Si se someti? a una preparaci?n intestinal para su procedimiento, es posible que no tenga una evacuaci?n intestinal normal durante algunos d?as. ?  ?Tenga en cuenta:  Es posible que note un poco de irritaci?n y congesti?n en la nariz o alg?n drenaje.  Esto es debido al ox?geno utilizado durante su procedimiento.  No hay que preocuparse y esto debe desaparecer m?s o menos en un d?a. ?  ?S?NTOMAS PARA REPORTAR INMEDIATAMENTE: ? Despu?s de una endoscopia inferior (colonoscopia o sigmoidoscopia flexible): ? Cantidades excesivas de sangre en las heces fecales ? Sensibilidad significativa o empeoramiento de los dolores abdominales  ? Hinchaz?n aguda del abdomen que antes no ten?a  ? Fiebre de 100?F o m?s ?  ?Despu?s de la endoscopia superior (EGD) ? V?mitos de sangre o material  como caf? molido  ? Dolor en el pecho o dolor debajo de los om?platos que antes no ten?a  ? Dolor o dificultad persistente para tragar ? Falta de aire que antes no ten?a  ? Fiebre de 100?F o m?s ? Heces fecales negras y pegajosas ?  ?Para asuntos urgentes o de emergencia, puede comunicarse con un gastroenter?logo a cualquier hora llamando al [phone]. ? ?DIETA:  Recomendamos una comida peque?a al principio, pero luego puede continuar con su dieta normal.  Tome muchos l?quidos, Teacher, adult education las bebidas alcoh?licas durante 24 horas.  ?  ?ACTIVIDAD:  Debe planear tomarse las cosas con calma por el resto del d?a y no debe CONDUCIR ni usar maquinaria pesada hasta ma?ana (debido a los medicamentos de sedaci?n Furniture conservator/restorer).   ?  ?SEGUIMIENTO: ?Teacher, music? al n?mero que aparece en su historial al siguiente d?a h?bil de su procedimiento para ver c?mo se siente y para responder cualquier pregunta o inquietud que pueda tener con respecto a la informaci?n que se le dio despu?s del procedimiento. Si no podemos contactarle, le dejaremos un mensaje.  Sin embargo, si se siente bien y no tiene ning?n problema, no es necesario que nos devuelva la llamada.  Asumiremos que ha regresado a sus actividades diarias normales sin incidentes. ?Si se le tomaron algunas biopsias, le contactaremos por tel?fono o por carta en las pr?ximas 3 semanas.  Si no ha sabido [employer] biopsias en el transcurso de 3 semanas, por favor ll?menos al [phone].  ? ?FIRMAS/CONFIDENCIALIDAD: ?Usted y/o el acompa?ante que le cuide han firmado documentos que se ingresar?n en su historial m?dico electr?nico.  Estas firmas atestiguan el hecho de que la informaci?n anterior ? ? ?YOU HAD AN ENDOSCOPIC PROCEDURE TODAY AT [facility]:   Refer to the procedure report that was given to you for any specific questions about what was found during the examination.  If the procedure report does not answer your  questions, please call your gastroenterologist to clarify.  If you requested that your care partner not be given the details of your procedure findings, then the procedure report has been included in a sealed envelope for you to review at your convenience later. ? ?YOU SHOULD EXPECT: Some feelings of bloating in the abdomen. Passage of more gas than usual.  Walking can help get rid of the air that was put into your GI tract during the procedure and reduce the bloating. If you had a lower endoscopy (such as a colonoscopy or flexible sigmoidoscopy) you may notice spotting of blood in your stool or on the toilet paper. If you underwent a bowel prep for your procedure, you may not have a normal bowel movement for a few days. ? ?Please Note:  You might notice some irritation and congestion in your nose or some drainage.  This is from the oxygen used during your procedure.  There is no need for concern and it should clear up in a day or so. ? ?SYMPTOMS TO REPORT IMMEDIATELY: ? ?Following lower endoscopy (colonoscopy or flexible sigmoidoscopy): ? Excessive amounts of blood in the stool ? Significant tenderness or worsening of abdominal pains ? Swelling of the abdomen that is new, acute ? Fever of 100?F or higher ? ?Following upper endoscopy (EGD) ? Vomiting of blood or coffee ground material ? New chest pain or pain under the shoulder blades ? Painful or persistently difficult swallowing ? New shortness of breath ? Fever of 100?F or higher ? Black, tarry-looking stools ? ?For urgent or emergent issues, a gastroenterologist can be reached at any hour by calling [phone]. ?Do not use MyChart messaging for urgent concerns.  ? ? ?DIET:  We do recommend a small meal at first, but then you may proceed to your regular diet.  Drink plenty of fluids but you should avoid alcoholic beverages for 24 hours. ? ?ACTIVITY:  You should plan to take it easy for the rest of today and you should NOT DRIVE or use heavy machinery until  tomorrow (because of the sedation medicines used during the test).   ? ?FOLLOW UP: ?Our staff will call the number listed on your records 48-72 hours following your procedure to check on you and address any questions or concerns that you may have regarding the information given to you following your procedure. If we do not reach you, we will leave a message.  We will attempt to reach you two times.  During this call, we will ask if you have developed any symptoms of COVID 19. If you develop any symptoms (ie: fever, flu-like symptoms, shortness of breath, cough etc.) before then, please call [phone].  If you test positive for Covid 19 in the 2 weeks post procedure, please call and report this information to [place].   ? ?If any biopsies were taken you will be contacted by phone or by letter within the next 1-3 weeks.  Please call us at [phone] if you have not heard about the biopsies in 3  weeks.  ? ? ?SIGNATURES/CONFIDENTIALITY: ?You and/or your care partner have signed paperwork which will be entered into your electronic medical record.  These signatures attest to the fact that that the information above on your After Visit Summary has been reviewed and is understood.  Full responsibility of the confidentiality of this discharge information lies with you and/or your care-partner. ?Called to room to assist during endoscopic procedure.  Patient ID and intended procedure confirmed with present staff. Received instructions for my participation in the procedure from the performing physician.   ?

## 2022-03-13 NOTE — Op Note (Signed)
[place] ?Patient Name: Emily [name] ?Procedure Date: 03/13/2022 9:59 AM ?MRN: [id number] ?Endoscopist: Milus Banister , MD ?Age: 66 ?Referring MD:  ?Date of Birth: 1956/08/05 ?Gender: Female ?Account #: [id number] ?Procedure:                Colonoscopy ?Indications:              Screening in patient at increased risk: Colonoscopy  ?                          Dr. Paulita Fujita 02/2012 (as outpatient) found small  ?                          polyp, was inflammatory, he recommended recall at 5  ?                          year interval due to "fair prep". Brother had colon  ?                          cancer around age 42 ?Medicines:                Monitored Anesthesia Care ?Procedure:                Pre-Anesthesia Assessment: ?                          - Prior to the procedure, a History and Physical  ?                          was performed, and patient medications and  ?                          allergies were reviewed. The patient's tolerance of  ?                          previous anesthesia was also reviewed. The risks  ?                          and benefits of the procedure and the sedation  ?                          options and risks were discussed with the patient.  ?                          All questions were answered, and informed consent  ?                          was obtained. Prior Anticoagulants: The patient has  ?                          taken no previous anticoagulant or antiplatelet  ?                          agents. ASA Grade Assessment: II - A patient with  ?  mild systemic disease. After reviewing the risks  ?                          and benefits, the patient was deemed in  ?                          satisfactory condition to undergo the procedure. ?                          After obtaining informed consent, the colonoscope  ?                          was passed under direct vision. Throughout the  ?                          procedure, the patient's blood  pressure, pulse, and  ?                          oxygen saturations were monitored continuously. The  ?                          CF [id number] #[id number] was introduced through the anus  ?                          and advanced to the the cecum, identified by  ?                          appendiceal orifice and ileocecal valve. The  ?                          colonoscopy was performed without difficulty. The  ?                          patient tolerated the procedure well. The quality  ?                          of the bowel preparation was good. The ileocecal  ?                          valve, appendiceal orifice, and rectum were  ?                          photographed. ?Scope In: 10:14:36 AM ?Scope Out: 10:24:23 AM ?Scope Withdrawal Time: 0 hours 7 minutes 47 seconds  ?Total Procedure Duration: 0 hours 9 minutes 47 seconds  ?Findings:                 Two sessile polyps were found in the rectum and  ?                          ascending colon. The polyps were 2 to 6 mm in size.  ?                          These polyps were removed with a cold  snare.  ?                          Resection and retrieval were complete. ?                          The exam was otherwise without abnormality on  ?                          direct and retroflexion views. ?Complications:            No immediate complications. Estimated blood loss:  ?                          None. ?Estimated Blood Loss:     Estimated blood loss: none. ?Impression:               - Two 2 to 6 mm polyps in the rectum and in the  ?                          ascending colon, removed with a cold snare.  ?                          Resected and retrieved. ?                          - The examination was otherwise normal on direct  ?                          and retroflexion views. ?Recommendation:           - Await pathology results. ?                          - EGD now. ?Milus Banister, MD ?03/13/2022 10:27:47 AM ?This report has been signed electronically. ?

## 2022-03-13 NOTE — Op Note (Signed)
[facility] ?Patient Name: Emily [name] ?Procedure Date: 03/13/2022 9:59 AM ?MRN: [id number] ?Endoscopist: Milus Banister , MD ?Age: 66 ?Referring MD:  ?Date of Birth: December 28, 1955 ?Gender: Female ?Account #: [id number] ?Procedure:                Upper GI endoscopy ?Indications:              Dysphagia, dry mouth. ?Medicines:                Monitored Anesthesia Care ?Procedure:                Pre-Anesthesia Assessment: ?                          - Prior to the procedure, a History and Physical  ?                          was performed, and patient medications and  ?                          allergies were reviewed. The patient's tolerance of  ?                          previous anesthesia was also reviewed. The risks  ?                          and benefits of the procedure and the sedation  ?                          options and risks were discussed with the patient.  ?                          All questions were answered, and informed consent  ?                          was obtained. Prior Anticoagulants: The patient has  ?                          taken no previous anticoagulant or antiplatelet  ?                          agents. ASA Grade Assessment: II - A patient with  ?                          mild systemic disease. After reviewing the risks  ?                          and benefits, the patient was deemed in  ?                          satisfactory condition to undergo the procedure. ?                          After obtaining informed consent, the endoscope was  ?  passed under direct vision. Throughout the  ?                          procedure, the patient's blood pressure, pulse, and  ?                          oxygen saturations were monitored continuously. The  ?                          Endoscope was introduced through the mouth, and  ?                          advanced to the second part of duodenum. The upper  ?                          GI endoscopy was  accomplished without difficulty.  ?                          The patient tolerated the procedure well. ?Scope In: ?Scope Out: ?Findings:                 5cm hiatal hernia with usual resultant  ?                          foreshortened and tortuous esophagus. ?                          The exam was otherwise without abnormality. ?Complications:            No immediate complications. Estimated blood loss:  ?                          None. ?Estimated Blood Loss:     Estimated blood loss: none. ?Impression:               - 5 cm hiatal hernia with usual resultant  ?                          foreshortened and tortuous esophagus. ?                          - No specimens collected. ?Recommendation:           - Patient has a contact number available for  ?                          emergencies. The signs and symptoms of potential  ?                          delayed complications were discussed with the  ?                          patient. Return to normal activities tomorrow.  ?                          Written discharge instructions were provided to the  ?  patient. ?                          - Resume previous diet. ?                          - Continue present medications. Stay on the pepcid  ?                          at bedtime every night. This seems to be helping  ?                          your symptoms. ?Milus Banister, MD ?03/13/2022 10:34:37 AM ?This report has been signed electronically. ?

## 2022-03-13 NOTE — Progress Notes (Signed)
VS by CW ? ?[name] states no medical or surgical changes since previsit or office visit. ? ? ?Interpreter used today at the [facility] for this pt.  Interpreter's name is- Verdis Frederickson ?

## 2022-03-15 ENCOUNTER — Telehealth: Payer: Self-pay | Admitting: Gastroenterology

## 2022-03-15 ENCOUNTER — Telehealth: Payer: Self-pay

## 2022-03-15 NOTE — Telephone Encounter (Signed)
Patient daughter called seeking advise for mother's rectal itching. Would like to know what can be used for the itching. ?

## 2022-03-15 NOTE — Telephone Encounter (Signed)
Tried to reach the pt daughter by phone-no answer voice mail full will try later ?

## 2022-03-15 NOTE — Telephone Encounter (Signed)
?  Follow up Call- ? ? ?  03/13/2022  ?  9:48 AM  ?Call back number  ?Post procedure Call Back phone  # [phone]- daughter  ?Permission to leave phone message Yes  ?  ? ?Patient questions: ? ?Do you have a fever, pain , or abdominal swelling? No. ?Pain Score  0 * ? ?Have you tolerated food without any problems? Yes.   ? ?Have you been able to return to your normal activities? Yes.   ? ?Do you have any questions about your discharge instructions: ?Diet   No. ?Medications  No. ?Follow up visit  No. ? ?Do you have questions or concerns about your Care? No. ? ?Actions: ?* If pain score is 4 or above: ?No action needed, pain <4. ? ? ?

## 2022-03-16 NOTE — Telephone Encounter (Signed)
I spoke with the pt's daughter and she tells me that the pt had some rectal itching after her colonoscopy on 3/21.  She thinks the issue has resolved at this time but will call her mother and get more information and call back if needed.  ?

## 2022-03-19 ENCOUNTER — Encounter: Payer: Self-pay | Admitting: Gastroenterology

## 2022-03-28 ENCOUNTER — Other Ambulatory Visit: Payer: Self-pay | Admitting: Emergency Medicine

## 2022-03-28 DIAGNOSIS — K219 Gastro-esophageal reflux disease without esophagitis: Secondary | ICD-10-CM

## 2022-04-25 ENCOUNTER — Other Ambulatory Visit: Payer: Self-pay

## 2022-04-25 ENCOUNTER — Emergency Department (HOSPITAL_COMMUNITY): Payer: Managed Care, Other (non HMO)

## 2022-04-25 ENCOUNTER — Encounter (HOSPITAL_COMMUNITY): Payer: Self-pay

## 2022-04-25 ENCOUNTER — Emergency Department (HOSPITAL_COMMUNITY)
Admission: EM | Admit: 2022-04-25 | Discharge: 2022-04-26 | Disposition: A | Discharge status: Home / Self Care | Payer: Managed Care, Other (non HMO) | Attending: Emergency Medicine | Admitting: Emergency Medicine

## 2022-04-25 DIAGNOSIS — R079 Chest pain, unspecified: Secondary | ICD-10-CM | POA: Diagnosis present

## 2022-04-25 DIAGNOSIS — Z79899 Other long term (current) drug therapy: Secondary | ICD-10-CM | POA: Diagnosis not present

## 2022-04-25 DIAGNOSIS — R0789 Other chest pain: Secondary | ICD-10-CM

## 2022-04-25 DIAGNOSIS — I10 Essential (primary) hypertension: Secondary | ICD-10-CM | POA: Insufficient documentation

## 2022-04-25 LAB — BASIC METABOLIC PANEL
Anion gap: 5 (ref 5–15)
BUN: 15 mg/dL (ref 8–23)
CO2: 27 mmol/L (ref 22–32)
Calcium: 9.7 mg/dL (ref 8.9–10.3)
Chloride: 108 mmol/L (ref 98–111)
Creatinine, Ser: 0.72 mg/dL (ref 0.44–1.00)
GFR, Estimated: >60 mL/min (ref >60)
Glucose, Bld: 109 mg/dL — ABNORMAL HIGH (ref 70–99)
Potassium: 3.9 mmol/L (ref 3.5–5.1)
Sodium: 140 mmol/L (ref 135–145)

## 2022-04-25 LAB — CBC
HCT: 39.7 % (ref 36.0–46.0)
Hemoglobin: 13.1 g/dL (ref 12.0–15.0)
MCH: 25.9 pg — ABNORMAL LOW (ref 26.0–34.0)
MCHC: 33 g/dL (ref 30.0–36.0)
MCV: 78.5 fL — ABNORMAL LOW (ref 80.0–100.0)
Platelets: 281 10*3/uL (ref 150–400)
RBC: 5.06 MIL/uL (ref 3.87–5.11)
RDW: 15.2 % (ref 11.5–15.5)
WBC: 8.1 10*3/uL (ref 4.0–10.5)
nRBC: 0 % (ref 0.0–0.2)

## 2022-04-25 LAB — TROPONIN I (HIGH SENSITIVITY)
Troponin I (High Sensitivity): 3 ng/L (ref <18)
Troponin I (High Sensitivity): 3 ng/L (ref <18)

## 2022-04-25 NOTE — ED Triage Notes (Signed)
Pt reports with chest pain, HTN, and abdominal pain since today. Pt reports taking all medications as prescribed.  ?

## 2022-04-25 NOTE — ED Provider Notes (Signed)
?Emily [name] ?Provider Note ? ? ?CSN: [id number] ?Arrival date & time: 04/25/22  2050 ? ?  ? ?History ? ?Chief Complaint  ?Patient presents with  ? Chest Pain  ? Hypertension  ? Abdominal Pain  ? ? ?Emily [name] is a 66 y.o. female. ? ?66 year old female with past medical history of carotid artery stenosis, hypertension, hyperlipidemia presents today for evaluation of elevated blood pressure, chest pain, abdominal pain.  Chest pain was left-sided, nonradiating and not associated with lightheadedness, diaphoresis, nausea, or vomiting.  Patient states this morning she noticed a slight headache so she checked her blood pressure which was elevated which caused her increased worrying and anxiety which brought on the rest of her symptoms.  She does not have history of CAD.  She currently is without any abdominal pain, chest pain or other complaints. ? ?The history is provided by the patient. No language interpreter was used.  ? ?  ? ?Home Medications ?Prior to Admission medications   ?Medication Sig Start Date End Date Taking? Authorizing Provider  ?ALPRAZolam (XANAX) 0.5 MG tablet Take 1 tablet (0.5 mg total) by mouth daily as needed for anxiety. 02/12/22   Horald Pollen, MD  ?Ascorbic Acid (VITAMIN C) 1000 MG tablet Take 1,000 mg by mouth daily.    [provider]  ?CALCIUM PO Take 1 tablet by mouth daily.     [provider]  ?Cholecalciferol (VITAMIN D3 PO) Take 125 mg by mouth daily.    [provider]  ?Coenzyme Q10 (CO Q-10 PO) Take 18 mg by mouth daily.    [provider]  ?esomeprazole (NEXIUM) 40 MG capsule Take 40 mg by mouth daily at 12 noon.    [provider]  ?famotidine (PEPCID) 20 MG tablet Take 20 mg by mouth at bedtime.    [provider]  ?lisinopril (ZESTRIL) 40 MG tablet Take 1 tablet (40 mg total) by mouth daily. 02/12/22   Horald Pollen, MD  ?metoprolol succinate (TOPROL-XL) 50 MG 24 hr  tablet Take 1 tablet (50 mg total) by mouth daily. Take with or immediately following a meal. 10/02/21   Sagardia, Ines Bloomer, MD  ?Multiple Vitamin (MULTI-VITAMIN DAILY PO) Take by mouth daily. TAKE 1 TABLET DAILY    [provider]  ?rosuvastatin (CRESTOR) 20 MG tablet Take 1 tablet (20 mg total) by mouth daily. 06/20/21   Horald Pollen, MD  ?Turmeric (QC TUMERIC COMPLEX PO) Take by mouth daily. NATURAL SUPPLEMENT    [provider]  ?   ? ?Allergies    ?Ibuprofen, Aspirin, Atorvastatin, and Nsaids   ? ?Review of Systems   ?Review of Systems  ?Respiratory:  Negative for shortness of breath.   ?All other systems reviewed and are negative. ? ?Physical Exam ?Updated Vital Signs ?BP 127/74   Pulse 60   Temp (!) 97.4 ?F (36.3 ?C) (Oral)   Resp 16   SpO2 99%  ?Physical Exam ?Vitals and nursing note reviewed.  ?Constitutional:   ?   General: She is not in acute distress. ?   Appearance: Normal appearance. She is not ill-appearing.  ?HENT:  ?   Head: Normocephalic and atraumatic.  ?   Nose: Nose normal.  ?Eyes:  ?   General: No scleral icterus. ?   Extraocular Movements: Extraocular movements intact.  ?   Conjunctiva/sclera: Conjunctivae normal.  ?Cardiovascular:  ?   Rate and Rhythm: Regular rhythm.  ?   Pulses: Normal pulses.  ?Pulmonary:  ?  Effort: Pulmonary effort is normal. No respiratory distress.  ?   Breath sounds: Normal breath sounds. No wheezing or rales.  ?Abdominal:  ?   General: There is no distension.  ?   Tenderness: There is no abdominal tenderness. There is no guarding.  ?Musculoskeletal:     ?   General: Normal range of motion.  ?   Cervical back: Normal range of motion.  ?Skin: ?   General: Skin is warm and dry.  ?Neurological:  ?   General: No focal deficit present.  ?   Mental Status: She is alert. Mental status is at baseline.  ?   Comments: Pupils equal round reactive and accommodating to light.  EOM's intact.  Cranial nerves III through XII intact.  Full range of  motion of bilateral upper and lower extremities with 5/5 strength in extensor and flexor muscle groups.  Radial pulses symmetrical.  Sensation intact.  ? ? ?ED Results / Procedures / Treatments   ?Labs ?(all labs ordered are listed, but only abnormal results are displayed) ?Labs Reviewed  ?BASIC METABOLIC PANEL - Abnormal; Notable for the following components:  ?    Result Value  ? Glucose, Bld 109 (*)   ? All other components within normal limits  ?CBC - Abnormal; Notable for the following components:  ? MCV 78.5 (*)   ? MCH 25.9 (*)   ? All other components within normal limits  ?TROPONIN I (HIGH SENSITIVITY)  ?TROPONIN I (HIGH SENSITIVITY)  ? ? ?EKG ?None ? ?Radiology ?DG Chest 2 View ? ?Result Date: 04/25/2022 ?CLINICAL DATA:  Chest pain EXAM: CHEST - 2 VIEW COMPARISON:  09/27/2021 FINDINGS: Cardiac shadow is within normal limits. Lungs are well aerated bilaterally. No focal infiltrate or effusion is seen. Previously noted hiatal hernia is not well appreciated on this exam. No bony abnormality is seen. IMPRESSION: No active cardiopulmonary disease. Electronically Signed   By: Inez Catalina M.D.   On: 04/25/2022 21:19   ? ?Procedures ?Procedures  ? ? ?Medications Ordered in ED ?Medications - No data to display ? ?ED Course/ Medical Decision Making/ A&P ?  ?                        ?Medical Decision Making ?Amount and/or Complexity of Data Reviewed ?Labs: ordered. ?Radiology: ordered. ? ? ?Medical Decision Making / ED Course ? ? ?This patient presents to the ED for concern of elevated blood pressure, chest pain, this involves an extensive number of treatment options, and is a complaint that carries with it a high risk of complications and morbidity.  The differential diagnosis includes hypertensive emergency, ACS, MSK pain, GERD, pneumonia, PE ? ?MDM: ?66 year old female with past medical history significant for carotid artery stenosis, hypertension, hyperlipidemia presents today for concern of elevated blood  pressure, chest pain, and abdominal pain.  Chest pain was nonradiating without associated lightheadedness, diaphoresis, nausea, or vomiting.  Patient initially had mild headache this morning which caused her to check her blood pressure which was slightly elevated which cause her more concern and then she had onset of symptoms.  Husband says this has happened before.  Patient concurs.  Patient currently is without chest pain, shortness of breath, abdominal pain.  Denied associated abdominal pain earlier with symptom onset.  Blood pressure is much improved and is normotensive at this time.  Patient has a PCP but no cardiologist.  Patient has a low heart score of 3.  Given normal troponin, EKG without acute  ischemic changes doubt ACS.  However given significantly elevated blood pressure, chest pain and high risk factors of hyperlipidemia, hypertension and carotid artery stenosis we will provide her with cardiology follow-up.  Doubt PE.  Patient is without peripheral edema, recent travel, hypoxia, tachypnea, or tachycardia.  She is low risk on Wells criteria and PERC negative.  CBC without leukocytosis, or anemia.  BMP unremarkable with the exception of glucose of 109.  Troponin negative x2.  X-ray without acute cardiopulmonary process.  Patient is appropriate for discharge.  Cardiology follow-up provided.  Discussed importance of follow-up with PCP.  Patient voices understanding and is in agreement with plan. ? ? ?Lab Tests: ?-I ordered, reviewed, and interpreted labs.   ?The pertinent results include:   ?Labs Reviewed  ?BASIC METABOLIC PANEL - Abnormal; Notable for the following components:  ?    Result Value  ? Glucose, Bld 109 (*)   ? All other components within normal limits  ?CBC - Abnormal; Notable for the following components:  ? MCV 78.5 (*)   ? MCH 25.9 (*)   ? All other components within normal limits  ?TROPONIN I (HIGH SENSITIVITY)  ?TROPONIN I (HIGH SENSITIVITY)  ?  ? ? ?EKG ? EKG  Interpretation ? ?Date/Time:    ?Ventricular Rate:    ?PR Interval:    ?QRS Duration:   ?QT Interval:    ?QTC Calculation:   ?R Axis:     ?Text Interpretation:   ?  ? ?  ? ? ? ?Imaging Studies ordered: ?I ordered imaging studies including chest

## 2022-04-26 NOTE — Discharge Instructions (Signed)
Your work-up today was reassuring.  Your heart enzyme, EKG did not show any concerning findings as to the cause of your chest pain.  However given your age and risk factors I have included a cardiology follow-up for you.  Please call and schedule this appointment.  Also recommend you follow-up with your primary care provider.  If you have any worsening symptoms, or chest pain or shortness of breath please return to the emergency room for evaluation. ?

## 2022-07-11 ENCOUNTER — Other Ambulatory Visit: Payer: Self-pay | Admitting: Emergency Medicine

## 2022-07-11 DIAGNOSIS — E785 Hyperlipidemia, unspecified: Secondary | ICD-10-CM

## 2022-10-11 ENCOUNTER — Other Ambulatory Visit: Payer: Self-pay | Admitting: Emergency Medicine

## 2022-10-11 DIAGNOSIS — I10 Essential (primary) hypertension: Secondary | ICD-10-CM

## 2022-10-18 ENCOUNTER — Ambulatory Visit: Payer: Managed Care, Other (non HMO) | Admitting: Emergency Medicine

## 2022-10-25 ENCOUNTER — Ambulatory Visit: Payer: Managed Care, Other (non HMO) | Admitting: Emergency Medicine

## 2022-10-30 ENCOUNTER — Ambulatory Visit: Payer: Managed Care, Other (non HMO) | Admitting: Emergency Medicine

## 2022-10-30 ENCOUNTER — Encounter: Payer: Self-pay | Admitting: Emergency Medicine

## 2022-10-30 VITALS — BP 120/80 | HR 75 | Temp 99.0°F | Ht 66.0 in | Wt 194.2 lb

## 2022-10-30 DIAGNOSIS — I10 Essential (primary) hypertension: Secondary | ICD-10-CM | POA: Diagnosis not present

## 2022-10-30 DIAGNOSIS — E785 Hyperlipidemia, unspecified: Secondary | ICD-10-CM | POA: Diagnosis not present

## 2022-10-30 DIAGNOSIS — R7303 Prediabetes: Secondary | ICD-10-CM

## 2022-10-30 DIAGNOSIS — Z0000 Encounter for general adult medical examination without abnormal findings: Secondary | ICD-10-CM

## 2022-10-30 DIAGNOSIS — Z1329 Encounter for screening for other suspected endocrine disorder: Secondary | ICD-10-CM

## 2022-10-30 DIAGNOSIS — G8929 Other chronic pain: Secondary | ICD-10-CM

## 2022-10-30 DIAGNOSIS — B351 Tinea unguium: Secondary | ICD-10-CM | POA: Diagnosis not present

## 2022-10-30 DIAGNOSIS — Z114 Encounter for screening for human immunodeficiency virus [HIV]: Secondary | ICD-10-CM

## 2022-10-30 DIAGNOSIS — M545 Low back pain, unspecified: Secondary | ICD-10-CM

## 2022-10-30 DIAGNOSIS — Z13220 Encounter for screening for lipoid disorders: Secondary | ICD-10-CM

## 2022-10-30 DIAGNOSIS — Z13228 Encounter for screening for other metabolic disorders: Secondary | ICD-10-CM | POA: Diagnosis not present

## 2022-10-30 DIAGNOSIS — Z130 Encounter for screening for diseases of the blood and blood-forming organs and certain disorders involving the immune mechanism: Secondary | ICD-10-CM

## 2022-10-30 DIAGNOSIS — Z1159 Encounter for screening for other viral diseases: Secondary | ICD-10-CM

## 2022-10-30 DIAGNOSIS — K219 Gastro-esophageal reflux disease without esophagitis: Secondary | ICD-10-CM

## 2022-10-30 LAB — HEMOGLOBIN A1C: Hgb A1c MFr Bld: 6 % (ref 4.6–6.5)

## 2022-10-30 LAB — LIPID PANEL
Cholesterol: 185 mg/dL (ref 0–200)
HDL: 53.1 mg/dL (ref >39.00)
LDL Cholesterol: 109 mg/dL — ABNORMAL HIGH (ref 0–99)
NonHDL: 132.32
Total CHOL/HDL Ratio: 3
Triglycerides: 119 mg/dL (ref 0.0–149.0)
VLDL: 23.8 mg/dL (ref 0.0–40.0)

## 2022-10-30 LAB — CBC WITH DIFFERENTIAL/PLATELET
Basophils Absolute: 0.1 10*3/uL (ref 0.0–0.1)
Basophils Relative: 0.8 % (ref 0.0–3.0)
Eosinophils Absolute: 0.2 10*3/uL (ref 0.0–0.7)
Eosinophils Relative: 2.8 % (ref 0.0–5.0)
HCT: 37.6 % (ref 36.0–46.0)
Hemoglobin: 12 g/dL (ref 12.0–15.0)
Lymphocytes Relative: 31.4 % (ref 12.0–46.0)
Lymphs Abs: 2.3 10*3/uL (ref 0.7–4.0)
MCHC: 31.8 g/dL (ref 30.0–36.0)
MCV: 77.6 fl — ABNORMAL LOW (ref 78.0–100.0)
Monocytes Absolute: 0.5 10*3/uL (ref 0.1–1.0)
Monocytes Relative: 7.1 % (ref 3.0–12.0)
Neutro Abs: 4.3 10*3/uL (ref 1.4–7.7)
Neutrophils Relative %: 57.9 % (ref 43.0–77.0)
Platelets: 257 10*3/uL (ref 150.0–400.0)
RBC: 4.85 Mil/uL (ref 3.87–5.11)
RDW: 14.6 % (ref 11.5–15.5)
WBC: 7.5 10*3/uL (ref 4.0–10.5)

## 2022-10-30 LAB — COMPREHENSIVE METABOLIC PANEL
ALT: 17 U/L (ref 0–35)
AST: 16 U/L (ref 0–37)
Albumin: 4.1 g/dL (ref 3.5–5.2)
Alkaline Phosphatase: 111 U/L (ref 39–117)
BUN: 17 mg/dL (ref 6–23)
CO2: 30 mEq/L (ref 19–32)
Calcium: 9.8 mg/dL (ref 8.4–10.5)
Chloride: 102 mEq/L (ref 96–112)
Creatinine, Ser: 0.72 mg/dL (ref 0.40–1.20)
GFR: 87.91 mL/min (ref >60.00)
Glucose, Bld: 122 mg/dL — ABNORMAL HIGH (ref 70–99)
Potassium: 3.6 mEq/L (ref 3.5–5.1)
Sodium: 136 mEq/L (ref 135–145)
Total Bilirubin: 0.2 mg/dL (ref 0.2–1.2)
Total Protein: 7.5 g/dL (ref 6.0–8.3)

## 2022-10-30 NOTE — Assessment & Plan Note (Signed)
Stable and well-controlled. Takes Nexium 40 mg as needed.

## 2022-10-30 NOTE — Assessment & Plan Note (Signed)
Stable.  No concerns. May take Tylenol as needed for pain.

## 2022-10-30 NOTE — Patient Instructions (Signed)

## 2022-10-30 NOTE — Assessment & Plan Note (Signed)
Stable.  Diet and nutrition discussed. Continue rosuvastatin 20 mg daily. The 10-year ASCVD risk score (Arnett DK, et al., 2019) is: 6.2%   Values used to calculate the score:     Age: 66 years     Sex: Female     Is Non-Hispanic African American: No     Diabetic: No     Tobacco smoker: No     Systolic Blood Pressure: 784 mmHg     Is BP treated: Yes     HDL Cholesterol: 45.8 mg/dL     Total Cholesterol: 153 mg/dL

## 2022-10-30 NOTE — Assessment & Plan Note (Signed)
Well-controlled hypertension. Continue lisinopril 40 mg and metoprolol succinate 50 mg daily. BP Readings from Last 3 Encounters:  10/30/22 120/80  04/26/22 115/80  03/13/22 111/69

## 2022-10-30 NOTE — Assessment & Plan Note (Signed)
Several toenails. Needs podiatry evaluation.  Referral placed today.

## 2022-10-30 NOTE — Progress Notes (Signed)
Emily [name] 66 y.o.   Chief Complaint  Patient presents with   Follow-up    Back pain, issues with her toe nail on her left foot     HISTORY OF PRESENT ILLNESS: This is a 66 y.o. female here for annual physical. Also complaining of toenail fungal infections. Requesting labs.  HPI   Prior to Admission medications   Medication Sig Start Date End Date Taking? Authorizing Provider  ALPRAZolam Duanne Moron) 0.5 MG tablet Take 1 tablet (0.5 mg total) by mouth daily as needed for anxiety. 02/12/22   Horald Pollen, MD  Ascorbic Acid (VITAMIN C) 1000 MG tablet Take 1,000 mg by mouth daily.    [provider]  CALCIUM PO Take 1 tablet by mouth daily.     [provider]  Cholecalciferol (VITAMIN D3 PO) Take 125 mg by mouth daily.    [provider]  Coenzyme Q10 (CO Q-10 PO) Take 18 mg by mouth daily.    [provider]  esomeprazole (NEXIUM) 40 MG capsule Take 40 mg by mouth daily at 12 noon.    [provider]  famotidine (PEPCID) 20 MG tablet Take 20 mg by mouth at bedtime.    [provider]  lisinopril (ZESTRIL) 40 MG tablet Take 1 tablet (40 mg total) by mouth daily. 02/12/22   Horald Pollen, MD  metoprolol succinate (TOPROL-XL) 50 MG 24 hr tablet TAKE 1 TABLET BY MOUTH DAILY. TAKE WITH OR IMMEDIATELY FOLLOWING A MEAL. 10/11/22   Horald Pollen, MD  Multiple Vitamin (MULTI-VITAMIN DAILY PO) Take by mouth daily. TAKE 1 TABLET DAILY    [provider]  rosuvastatin (CRESTOR) 20 MG tablet TAKE 1 TABLET BY MOUTH EVERY DAY 07/11/22   Horald Pollen, MD  Turmeric (QC TUMERIC COMPLEX PO) Take by mouth daily. NATURAL SUPPLEMENT    [provider]    Allergies  Allergen Reactions   Ibuprofen    Aspirin Hives   Atorvastatin     Yellow fingernails   Nsaids Hives    Patient Active Problem List   Diagnosis Date Noted   Prediabetes 02/12/2022   Hypertensive urgency 11/28/2021   History  of arthritis 08/15/2020   History of gastroesophageal reflux (GERD) 08/15/2020   Chronic bilateral low back pain without sciatica 06/30/2019   Carotid artery disease ([place]) 12/19/2018   Bilateral carotid artery stenosis, R- 40-59%, L 1-39% 11/2017 03/25/2018   Dyslipidemia 03/25/2018   GERD (gastroesophageal reflux disease) 04/12/2016   Dyspepsia 11/02/2015   Chronic high back pain 09/24/2015   Essential hypertension 10/03/2012   Hyperlipidemia 10/03/2012   DYSPEPSIA&OTHER SPEC DISORDERS FUNCTION STOMACH 07/20/2009    Past Medical History:  Diagnosis Date   Abdominal pain, chronic, epigastric 11/02/2015   Started amitriptyline at hospitalization 04/13/2016 EGD x 2 negative Also w/ chronic chest wall and back pain Would try not to work up further    Anxiety    Arthritis    BACK,KNEES   Beta thalassemia minor    diagnosed by hematology Dr. Jennette Kettle, also with sickle cell trait   Blood transfusion without reported diagnosis    "LONG TIME AGO"   Chronic high back pain 09/24/2015   Colon polyps    GERD (gastroesophageal reflux disease)    Heart murmur    "SMALL DOESN'T BOTHER ME"   Hyperlipidemia    Hypertension    Sickle cell trait ([place])    diagnosed by hematology Dr. Lindi Adie, coexsisting with beta-thalassemia minor    Past Surgical  History:  Procedure Laterality Date   ABDOMINAL HYSTERECTOMY     1998   COLONOSCOPY     ESOPHAGOGASTRODUODENOSCOPY     ESOPHAGOGASTRODUODENOSCOPY [name] 04/13/2016   Procedure: ESOPHAGOGASTRODUODENOSCOPY (EGD);  Surgeon: Gatha Mayer, MD;  Location: [facility];  Service: Endoscopy;  Laterality: [name];    Social History   Socioeconomic History   Marital status: Married    Spouse name: Not on file   Number of children: 3   Years of education: Not on file   Highest education level: Not on file  Occupational History   Not on file  Tobacco Use   Smoking status: Never    Passive exposure: Never   Smokeless tobacco: Never  Vaping Use    Vaping Use: Never used  Substance and Sexual Activity   Alcohol use: No    Alcohol/week: 0.0 standard drinks of alcohol   Drug use: No   Sexual activity: Yes    Comment: 1st intercourse- 25, partner-1, married- 13 yrs   Other Topics Concern   Not on file  Social History Narrative   Lives with husband and works at [employer] Strain: Not on file  Food Insecurity: Not on file  Transportation Needs: Not on file  Physical Activity: Not on file  Stress: Not on file  Social Connections: Not on file  Intimate Partner Violence: Not on file    Family History  Problem Relation Age of Onset   Aneurysm Mother    Diabetes Father    Colon polyps Brother    Colon cancer Brother    Liver cancer Brother    Colon cancer Nephew    Esophageal cancer Neg Hx    Stomach cancer Neg Hx    Pancreatic cancer Neg Hx    Rectal cancer Neg Hx      Review of Systems  Constitutional: Negative.  Negative for chills and fever.  HENT: Negative.  Negative for congestion and sore throat.   Respiratory: Negative.  Negative for cough and shortness of breath.   Cardiovascular: Negative.  Negative for chest pain and palpitations.  Gastrointestinal: Negative.  Negative for abdominal pain, nausea and vomiting.  Genitourinary: Negative.  Negative for dysuria and hematuria.  Skin: Negative.   Neurological: Negative.  Negative for dizziness and headaches.  All other systems reviewed and are negative.  Today's Vitals   10/30/22 1536  BP: 120/80  Pulse: 75  Temp: 99 F (37.2 C)  TempSrc: Oral  SpO2: 97%  Weight: 194 lb 4 oz (88.1 kg)  Height: '5\' 6"'$  (1.676 m)   Body mass index is 31.35 kg/m. Wt Readings from Last 3 Encounters:  10/30/22 194 lb 4 oz (88.1 kg)  03/13/22 188 lb (85.3 kg)  02/27/22 188 lb (85.3 kg)     Physical Exam Vitals reviewed.  Constitutional:      Appearance: Normal appearance.  HENT:     Head: Normocephalic.     Right  Ear: Tympanic membrane, ear canal and external ear normal.     Left Ear: Tympanic membrane, ear canal and external ear normal.     Mouth/Throat:     Mouth: Mucous membranes are moist.     Pharynx: Oropharynx is clear.  Eyes:     Extraocular Movements: Extraocular movements intact.     Conjunctiva/sclera: Conjunctivae normal.     Pupils: Pupils are equal, round, and reactive to light.  Cardiovascular:     Rate and Rhythm: Normal rate and  regular rhythm.     Pulses: Normal pulses.     Heart sounds: Normal heart sounds.  Pulmonary:     Effort: Pulmonary effort is normal.     Breath sounds: Normal breath sounds.  Abdominal:     Palpations: Abdomen is soft.     Tenderness: There is no abdominal tenderness.  Musculoskeletal:        General: Normal range of motion.     Cervical back: No tenderness.  Lymphadenopathy:     Cervical: No cervical adenopathy.  Skin:    General: Skin is warm and dry.     Capillary Refill: Capillary refill takes less than 2 seconds.     Comments: Positive onychomycosis of the first and fifth toes both feet  Neurological:     General: No focal deficit present.     Mental Status: She is alert and oriented to person, place, and time.  Psychiatric:        Mood and Affect: Mood normal.        Behavior: Behavior normal.      ASSESSMENT & PLAN: Problem List Items Addressed This Visit       Cardiovascular and Mediastinum   Essential hypertension    Well-controlled hypertension. Continue lisinopril 40 mg and metoprolol succinate 50 mg daily. BP Readings from Last 3 Encounters:  10/30/22 120/80  04/26/22 115/80  03/13/22 111/69        Relevant Orders   Comprehensive metabolic panel     Digestive   GERD (gastroesophageal reflux disease)    Stable and well-controlled. Takes Nexium 40 mg as needed.        Musculoskeletal and Integument   Onychomycosis    Several toenails. Needs podiatry evaluation.  Referral placed today.      Relevant Orders    Ambulatory referral to Podiatry     Other   Dyslipidemia    Stable.  Diet and nutrition discussed. Continue rosuvastatin 20 mg daily. The 10-year ASCVD risk score (Arnett DK, et al., 2019) is: 6.2%   Values used to calculate the score:     Age: 10 years     Sex: Female     Is Non-Hispanic African American: No     Diabetic: No     Tobacco smoker: No     Systolic Blood Pressure: 373 mmHg     Is BP treated: Yes     HDL Cholesterol: 45.8 mg/dL     Total Cholesterol: 153 mg/dL       Relevant Orders   Hemoglobin A1c   Lipid panel   Chronic bilateral low back pain without sciatica    Stable.  No concerns. May take Tylenol as needed for pain.      Prediabetes    Diet and nutrition discussed. Advised to decrease amount of daily carbohydrate intake and daily calories and increase amount of plant based protein in her diet.      Other Visit Diagnoses     Routine general medical examination at a health care facility    -  Primary   Need for hepatitis C screening test       Relevant Orders   Hepatitis C antibody screen   Screening for HIV (human immunodeficiency virus)       Relevant Orders   HIV antibody   Screening for deficiency anemia       Relevant Orders   CBC with Differential   Screening for lipoid disorders       Relevant Orders  Lipid panel   Screening for endocrine, metabolic and immunity disorder       Relevant Orders   Hemoglobin A1c      Modifiable risk factors discussed with patient. Anticipatory guidance according to age provided. The following topics were also discussed: Social Determinants of Health Smoking.  Non-smoker Diet and nutrition and need to decrease amount of daily carbohydrate intake and daily calories and increase amount of plant based protein in her diet Benefits of exercise Cancer screening and review of most recent mammogram in 2022 and colonoscopy in 2023 reports Vaccinations reviewed and recommendations Cardiovascular risk  assessment The 10-year ASCVD risk score (Arnett DK, et al., 2019) is: 6.2%   Values used to calculate the score:     Age: 36 years     Sex: Female     Is Non-Hispanic African American: No     Diabetic: No     Tobacco smoker: No     Systolic Blood Pressure: 992 mmHg     Is BP treated: Yes     HDL Cholesterol: 45.8 mg/dL     Total Cholesterol: 153 mg/dL Review of chronic medical problems under management Review of all medications Mental health including depression and anxiety Fall and accident prevention  Patient Instructions  Mantenimiento de Technical sales engineer en [place] Maintenance, Female Adoptar un estilo de vida saludable y recibir atencin preventiva son importantes para promover la salud y Musician. Consulte al mdico sobre: El esquema adecuado para hacerse pruebas y exmenes peridicos. Cosas que puede hacer por su cuenta para prevenir enfermedades y [place] sano. Qu debo saber sobre la dieta, el peso y el ejercicio? Consuma una dieta saludable  Consuma una dieta que incluya muchas verduras, frutas, productos lcteos con bajo contenido de [place] y Advertising account planner. No consuma muchos alimentos ricos en grasas slidas, azcares agregados o sodio. Mantenga un peso saludable El ndice de masa muscular [facility]) se [place] the [place] para identificar problemas de [place]. Proporciona una estimacin de la grasa corporal basndose en el peso y la altura. Su mdico puede ayudarle a Radiation protection practitioner [place] y a Scientist, forensic o Theatre manager un peso saludable. Haga ejercicio con regularidad Haga ejercicio con regularidad. Esta es una de las prcticas ms importantes que puede hacer por su salud. La [employer] de los adultos deben seguir estas pautas: Optometrist, al menos, 150 minutos de actividad fsica por semana. El ejercicio debe aumentar la frecuencia cardaca y Nature conservation officer transpirar (ejercicio de intensidad moderada). Hacer ejercicios de fortalecimiento por lo [employer] por semana. Agregue esto a su plan de  ejercicio de intensidad moderada. Pase menos tiempo sentada. Incluso la actividad fsica ligera puede ser beneficiosa. Controle sus niveles de colesterol y lpidos en la sangre Comience a realizarse anlisis de lpidos y Research officer, trade union en la sangre a los 45 aos y luego reptalos cada 5 aos. Hgase controlar los niveles de colesterol con mayor frecuencia si: Sus niveles de lpidos y colesterol son altos. Es mayor de 50 aos. Presenta un alto riesgo de padecer enfermedades cardacas. Qu debo saber sobre las pruebas de deteccin del cncer? Segn su historia clnica y sus antecedentes familiares, es posible que deba realizarse pruebas de deteccin del cncer en diferentes edades. Esto puede incluir pruebas de deteccin de lo siguiente: Cncer de mama. Cncer de cuello uterino. Cncer colorrectal. Cncer de piel. Cncer de pulmn. Qu debo saber sobre la enfermedad cardaca, la diabetes y la hipertensin arterial? Presin arterial y enfermedad cardaca La hipertensin arterial causa enfermedades cardacas y [place] de  accidente cerebrovascular. Es ms probable que esto se manifieste en las personas que tienen lecturas de presin arterial alta o tienen sobrepeso. Hgase controlar la presin arterial: Cada 3 a 5 aos si tiene entre 18 y 38 aos. Todos los aos si es mayor de 40 aos. Diabetes Realcese exmenes de deteccin de la diabetes con regularidad. Este anlisis revisa el nivel de azcar en la sangre en [place]. Hgase las pruebas de deteccin: Cada tres aos despus de los 60 aos de edad si tiene un peso normal y un bajo riesgo de padecer diabetes. Con ms frecuencia y a partir de [place] edad inferior si tiene sobrepeso o un alto riesgo de padecer diabetes. Qu debo saber sobre la prevencin de infecciones? Hepatitis B Si tiene un riesgo ms alto de contraer hepatitis B, debe someterse a un examen de deteccin de este virus. Hable con el mdico para averiguar si tiene riesgo de  contraer la infeccin por hepatitis B. Hepatitis C Se recomienda el anlisis a: [employer] 1945 y 1965. Todas las personas que tengan un riesgo de haber contrado hepatitis C. Enfermedades de transmisin sexual (ETS) Hgase las pruebas de Programme researcher, broadcasting/film/video de ITS, incluidas la gonorrea y la clamidia, si: Es sexualmente activa y es menor de 69 aos. Es mayor de 16 aos, y Investment banker, operational informa que corre riesgo de tener este tipo de infecciones. La actividad sexual ha cambiado desde que le hicieron la ltima prueba de deteccin y tiene un riesgo mayor de Best boy clamidia o Radio broadcast assistant. Pregntele al mdico si usted tiene riesgo. Pregntele al mdico si usted tiene un alto riesgo de Museum/gallery curator VIH. El mdico tambin puede recomendarle un medicamento recetado para ayudar a evitar la infeccin por el VIH. Si elige tomar medicamentos para prevenir el VIH, primero debe [employer] de deteccin del VIH. Luego debe hacerse anlisis cada 3 meses mientras est tomando los medicamentos. Embarazo Si est por dejar de Librarian, academic (fase premenopusica) y usted puede quedar [place], busque asesoramiento antes de [place]. Tome de 400 a 800 microgramos (mcg) de cido [employer] si [place]. Pida mtodos de control de la natalidad (anticonceptivos) si desea evitar un embarazo no deseado. Osteoporosis y [place] La osteoporosis es una enfermedad en la que los huesos pierden los minerales y la fuerza por el avance de la edad. El resultado pueden ser fracturas en los [place]. Si tiene 83 aos o ms, o si est en riesgo de sufrir osteoporosis y fracturas, pregunte a su mdico si debe: Hacerse pruebas de deteccin de prdida sea. Tomar un suplemento de calcio o de vitamina D para reducir el riesgo de fracturas. Recibir terapia de reemplazo hormonal (TRH) para tratar los sntomas de la menopausia. Siga estas indicaciones en su casa: Consumo de alcohol No beba alcohol si: Su  mdico le indica no hacerlo. Est embarazada, puede estar embarazada o est tratando de [place]. Si bebe alcohol: Limite la cantidad que bebe a lo siguiente: De 0 a 1 bebida por da. Sepa cunta cantidad de alcohol hay en las bebidas que toma. En los [place], una medida equivale a una botella de cerveza de 12 oz (355 ml), un vaso de vino de 5 oz (148 ml) o un vaso de una bebida alcohlica de alta graduacin de 1 oz (44 ml). Estilo de vida No consuma ningn producto que contenga nicotina o tabaco. Estos productos incluyen cigarrillos, tabaco para Higher education careers adviser y aparatos de vapeo, como los Psychologist, sport and exercise. Si necesita ayuda para  dejar de consumir estos productos, consulte al mdico. No consuma drogas. No comparta agujas. Solicite ayuda a su mdico si necesita apoyo o informacin para abandonar las drogas. Indicaciones generales Realcese los estudios de rutina de la salud, dentales y de Public librarian. [place]. Infrmele a su mdico si: Se siente deprimida con frecuencia. Alguna vez ha sido vctima de [place] o no se siente seguro en su casa. Resumen Adoptar un estilo de vida saludable y recibir atencin preventiva son importantes para promover la salud y Musician. Siga las instrucciones del mdico acerca de una dieta saludable, el ejercicio y la realizacin de pruebas o exmenes para Engineer, building services. Siga las instrucciones del mdico con respecto al control del colesterol y la presin arterial. Esta informacin no tiene Marine scientist el consejo del mdico. Asegrese de hacerle al mdico cualquier pregunta que tenga. Document Revised: 05/18/2021 Document Reviewed: 05/18/2021 Elsevier Patient Education  [place], MD [facility]

## 2022-10-30 NOTE — Assessment & Plan Note (Signed)
Diet and nutrition discussed.  Advised to decrease amount of daily carbohydrate intake and daily calories and increase amount of plant-based protein in her diet. 

## 2022-10-31 ENCOUNTER — Telehealth: Payer: Self-pay | Admitting: Emergency Medicine

## 2022-10-31 LAB — HIV ANTIBODY (ROUTINE TESTING W REFLEX): HIV 1&2 Ab, 4th Generation: NONREACTIVE

## 2022-10-31 LAB — HEPATITIS C ANTIBODY: Hepatitis C Ab: NONREACTIVE

## 2022-10-31 NOTE — Telephone Encounter (Signed)
Pt returned call to [facility] for results:  Provider's response to lab results are,  "Call patient please.  Normal labs.  Thanks."   Pt informed of provider's note of normal labs.  Pt to return in 6 months (04/2023)

## 2022-11-12 ENCOUNTER — Ambulatory Visit: Payer: Managed Care, Other (non HMO) | Admitting: Podiatry

## 2022-11-12 DIAGNOSIS — B351 Tinea unguium: Secondary | ICD-10-CM | POA: Diagnosis not present

## 2022-11-12 DIAGNOSIS — M79609 Pain in unspecified limb: Secondary | ICD-10-CM

## 2022-11-12 MED ORDER — CICLOPIROX 8 % EX SOLN: Freq: Every day | CUTANEOUS | 3 refills | Status: AC

## 2022-11-13 NOTE — Progress Notes (Signed)
  Subjective:  Patient ID: Emily [name], female    DOB: October 25, 1956,  MRN: [id number]  Chief Complaint  Patient presents with   Nail Problem    Bilateral hallux nail discoloration     65 y.o. female presents with the above complaint. History confirmed with patient.   Objective:  Physical Exam: warm, good capillary refill, no trophic changes or ulcerative lesions, normal DP and PT pulses, normal sensory exam, and dystrophy and onychomycosis with brown-yellow discoloration subungual debris bilateral 1 and fifth toe.  Assessment:   1. Onychomycosis   2. Pain due to onychomycosis of nail [B35.1, M79.609]      Plan:  Patient was evaluated and treated and all questions answered.  Discussed the etiology and treatment options for the condition in detail with the patient. Educated patient on the topical and oral treatment options for mycotic nails. Recommended debridement of the nails today. Sharp and mechanical debridement performed of all painful and mycotic nails today. Nails debrided in length and thickness using a nail nipper to level of comfort. Discussed treatment options including appropriate shoe gear. Follow up as needed for painful nails.  Rx Penlac sent to pharmacy.  We also discussed laser treatment if this does not improve, she will try the Penlac for 3 months and if it does not improve by that point she will call me for scheduling for laser treatment    Return if symptoms worsen or fail to improve.

## 2022-11-30 ENCOUNTER — Other Ambulatory Visit: Payer: Self-pay | Admitting: Emergency Medicine

## 2022-11-30 DIAGNOSIS — F418 Other specified anxiety disorders: Secondary | ICD-10-CM

## 2023-02-28 ENCOUNTER — Other Ambulatory Visit: Payer: Self-pay | Admitting: Emergency Medicine

## 2023-02-28 DIAGNOSIS — I10 Essential (primary) hypertension: Secondary | ICD-10-CM

## 2023-03-07 ENCOUNTER — Ambulatory Visit: Payer: Managed Care, Other (non HMO)

## 2023-03-07 ENCOUNTER — Encounter: Payer: Self-pay | Admitting: Emergency Medicine

## 2023-03-07 ENCOUNTER — Ambulatory Visit: Payer: Managed Care, Other (non HMO) | Admitting: Emergency Medicine

## 2023-03-07 VITALS — BP 124/82 | HR 75 | Temp 99.3°F | Ht 66.0 in | Wt 190.2 lb

## 2023-03-07 DIAGNOSIS — M25512 Pain in left shoulder: Secondary | ICD-10-CM

## 2023-03-07 DIAGNOSIS — M545 Low back pain, unspecified: Secondary | ICD-10-CM

## 2023-03-07 DIAGNOSIS — Z8739 Personal history of other diseases of the musculoskeletal system and connective tissue: Secondary | ICD-10-CM | POA: Diagnosis not present

## 2023-03-07 DIAGNOSIS — E785 Hyperlipidemia, unspecified: Secondary | ICD-10-CM | POA: Diagnosis not present

## 2023-03-07 DIAGNOSIS — I10 Essential (primary) hypertension: Secondary | ICD-10-CM | POA: Diagnosis not present

## 2023-03-07 DIAGNOSIS — G8929 Other chronic pain: Secondary | ICD-10-CM

## 2023-03-07 MED ORDER — ACETAMINOPHEN-CODEINE 300-30 MG PO TABS: 1.0000 | ORAL_TABLET | Freq: Four times a day (QID) | ORAL | 0 refills | Status: DC | PRN

## 2023-03-07 NOTE — Assessment & Plan Note (Signed)
Diet and nutrition discussed.  Continue rosuvastatin 20 mg daily. 

## 2023-03-07 NOTE — Assessment & Plan Note (Signed)
Active and affecting quality of life. Unremarkable x-ray Unremarkable physical exam Pain management discussed Recommend Tylenol for mild to moderate pain and Tylenol with codeine for moderate to severe pain May need orthopedic evaluation

## 2023-03-07 NOTE — Assessment & Plan Note (Signed)
Chronic lumbar pain. Unremarkable x-ray. No recent injuries. Pain management discussed. May need orthopedic evaluation.

## 2023-03-07 NOTE — Assessment & Plan Note (Signed)
Well-controlled hypertension. Continue lisinopril 40 mg daily, metoprolol succinate 50 mg daily, nifedipine 30 mg daily Cardiovascular risks associated with hypertension discussed. Dietary approaches to stop hypertension discussed.

## 2023-03-07 NOTE — Progress Notes (Signed)
Emily [name] 67 y.o.   Chief Complaint  Patient presents with   Medical Management of Chronic Issues    F/u appt, patient has a lot of joint pain, lower back pain     HISTORY OF PRESENT ILLNESS: This is a 67 y.o. female complaining of pain to left shoulder and lumbar area for the past 2 to 3 weeks Low-grade fever yesterday Joint pains No other complaints or medical concerns today.  HPI   Prior to Admission medications   Medication Sig Start Date End Date Taking? Authorizing Provider  ALPRAZolam Duanne Moron) 0.5 MG tablet TAKE 1 TABLET BY MOUTH EVERY DAY AS NEEDED FOR ANXIETY 12/01/22  Yes Kandiss Ihrig, Ines Bloomer, MD  Ascorbic Acid (VITAMIN C) 1000 MG tablet Take 1,000 mg by mouth daily.   Yes [provider]  CALCIUM PO Take 1 tablet by mouth daily.    Yes [provider]  Cholecalciferol (VITAMIN D3 PO) Take 125 mg by mouth daily.   Yes [provider]  ciclopirox (PENLAC) 8 % solution Apply topically at bedtime. Apply over nail and surrounding skin. Apply daily over previous coat. After seven (7) days, may remove with alcohol and continue cycle. 11/12/22  Yes McDonald, Stephan Minister, DPM  Coenzyme Q10 (CO Q-10 PO) Take 18 mg by mouth daily.   Yes [provider]  cyclobenzaprine (FLEXERIL) 5 MG tablet    Yes [provider]  esomeprazole (NEXIUM) 40 MG capsule Take 40 mg by mouth daily at 12 noon.   Yes [provider]  famotidine (PEPCID) 20 MG tablet Take 20 mg by mouth at bedtime.   Yes [provider]  lisinopril (ZESTRIL) 40 MG tablet TAKE 1 TABLET BY MOUTH EVERY DAY 02/28/23  Yes Darina Hartwell, Ines Bloomer, MD  metoprolol succinate (TOPROL-XL) 50 MG 24 hr tablet TAKE 1 TABLET BY MOUTH DAILY. TAKE WITH OR IMMEDIATELY FOLLOWING A MEAL. 10/11/22  Yes Jaquavious Mercer, Ines Bloomer, MD  Multiple Vitamin (MULTI-VITAMIN DAILY PO) Take by mouth daily. TAKE 1 TABLET DAILY   Yes [provider]  NIFEdipine (ADALAT CC) 30 MG 24 hr  tablet    Yes [provider]  rosuvastatin (CRESTOR) 20 MG tablet TAKE 1 TABLET BY MOUTH EVERY DAY 07/11/22  Yes Socorro Ebron, Ines Bloomer, MD  traMADol (ULTRAM) 50 MG tablet    Yes [provider]  Turmeric (QC TUMERIC COMPLEX PO) Take by mouth daily. NATURAL SUPPLEMENT   Yes [provider]    Allergies  Allergen Reactions   Ibuprofen    Aspirin Hives   Atorvastatin     Yellow fingernails   Nsaids Hives    Patient Active Problem List   Diagnosis Date Noted   Onychomycosis 10/30/2022   Prediabetes 02/12/2022   Hypertensive urgency 11/28/2021   History of arthritis 08/15/2020   History of gastroesophageal reflux (GERD) 08/15/2020   Chronic bilateral low back pain without sciatica 06/30/2019   Carotid artery disease ([place]) 12/19/2018   Bilateral carotid artery stenosis, R- 40-59%, L 1-39% 11/2017 03/25/2018   Dyslipidemia 03/25/2018   GERD (gastroesophageal reflux disease) 04/12/2016   Dyspepsia 11/02/2015   Chronic high back pain 09/24/2015   Essential hypertension 10/03/2012   Hyperlipidemia 10/03/2012   DYSPEPSIA&OTHER SPEC DISORDERS FUNCTION STOMACH 07/20/2009    Past Medical History:  Diagnosis Date   Abdominal pain, chronic, epigastric 11/02/2015   Started amitriptyline at hospitalization 04/13/2016 EGD x 2 negative Also w/ chronic chest wall and back pain Would try not to work up further  Anxiety    Arthritis    BACK,KNEES   Beta thalassemia minor    diagnosed by hematology Dr. Jennette Kettle, also with sickle cell trait   Blood transfusion without reported diagnosis    "LONG TIME AGO"   Chronic high back pain 09/24/2015   Colon polyps    GERD (gastroesophageal reflux disease)    Heart murmur    "SMALL DOESN'T BOTHER ME"   Hyperlipidemia    Hypertension    Sickle cell trait ([place])    diagnosed by hematology Dr. Lindi Adie, coexsisting with beta-thalassemia minor    Past Surgical History:  Procedure Laterality Date   ABDOMINAL HYSTERECTOMY      1998   COLONOSCOPY     ESOPHAGOGASTRODUODENOSCOPY     ESOPHAGOGASTRODUODENOSCOPY [name] 04/13/2016   Procedure: ESOPHAGOGASTRODUODENOSCOPY (EGD);  Surgeon: Gatha Mayer, MD;  Location: [facility];  Service: Endoscopy;  Laterality: [name];    Social History   Socioeconomic History   Marital status: Married    Spouse name: Not on file   Number of children: 3   Years of education: Not on file   Highest education level: Not on file  Occupational History   Not on file  Tobacco Use   Smoking status: Never    Passive exposure: Never   Smokeless tobacco: Never  Vaping Use   Vaping Use: Never used  Substance and Sexual Activity   Alcohol use: No    Alcohol/week: 0.0 standard drinks of alcohol   Drug use: No   Sexual activity: Yes    Comment: 1st intercourse- 36, partner-1, married- 9 yrs   Other Topics Concern   Not on file  Social History Narrative   Lives with husband and works at [employer] Strain: Not on file  Food Insecurity: Not on file  Transportation Needs: Not on file  Physical Activity: Not on file  Stress: Not on file  Social Connections: Not on file  Intimate Partner Violence: Not on file    Family History  Problem Relation Age of Onset   Aneurysm Mother    Diabetes Father    Colon polyps Brother    Colon cancer Brother    Liver cancer Brother    Colon cancer Nephew    Esophageal cancer Neg Hx    Stomach cancer Neg Hx    Pancreatic cancer Neg Hx    Rectal cancer Neg Hx      Review of Systems  Constitutional: Negative.  Negative for chills and fever.  HENT: Negative.  Negative for congestion and sore throat.   Respiratory: Negative.  Negative for cough and shortness of breath.   Cardiovascular: Negative.  Negative for chest pain and palpitations.  Gastrointestinal:  Negative for abdominal pain, diarrhea, nausea and vomiting.  Musculoskeletal:  Positive for back pain and joint pain (Left  shoulder).  Skin: Negative.  Negative for rash.  Neurological: Negative.  Negative for dizziness and headaches.  All other systems reviewed and are negative.   Vitals:   03/07/23 1411  BP: 124/82  Pulse: 75  Temp: 99.3 F (37.4 C)  SpO2: 92%    Physical Exam Vitals reviewed.  Constitutional:      Appearance: Normal appearance.  HENT:     Head: Normocephalic.  Eyes:     Extraocular Movements: Extraocular movements intact.     Pupils: Pupils are equal, round, and reactive to light.  Cardiovascular:     Rate and Rhythm: Normal rate and regular  rhythm.     Pulses: Normal pulses.     Heart sounds: Normal heart sounds.  Pulmonary:     Effort: Pulmonary effort is normal.     Breath sounds: Normal breath sounds.  Abdominal:     Palpations: Abdomen is soft.     Tenderness: There is no abdominal tenderness.  Musculoskeletal:     Cervical back: No tenderness.     Lumbar back: No spasms or bony tenderness. Decreased range of motion.     Comments: Left shoulder: Full range of motion but complaining of pain.  No localized tenderness or swelling.  Lymphadenopathy:     Cervical: No cervical adenopathy.  Skin:    General: Skin is warm and dry.     Capillary Refill: Capillary refill takes less than 2 seconds.  Neurological:     General: No focal deficit present.     Mental Status: She is alert and oriented to person, place, and time.  Psychiatric:        Mood and Affect: Mood normal.        Behavior: Behavior normal.    DG Lumbar Spine 2-3 Views  Result Date: 03/07/2023 CLINICAL DATA:  Low back and left shoulder pain EXAM: LUMBAR SPINE - 2-3 VIEW COMPARISON:  CT 11/22/2017 FINDINGS: Mild lumbar dextroscoliosis apex L2 without evident underlying vertebral anomaly. No significant spondylolisthesis. No fracture or dislocation. Mild narrowing of interspaces L1-L4 with small anterior endplate spurs. IMPRESSION: 1. No acute findings. 2. Mild lumbar dextroscoliosis and multilevel  degenerative change. Electronically Signed   By: Lucrezia Europe M.D.   On: 03/07/2023 15:14   DG Shoulder Left  Result Date: 03/07/2023 CLINICAL DATA:  Left shoulder pain EXAM: LEFT SHOULDER - 2+ VIEW COMPARISON:  None Available. FINDINGS: There is no evidence of fracture or dislocation. There is no evidence of arthropathy or other focal bone abnormality. Soft tissues are unremarkable. IMPRESSION: Negative. Electronically Signed   By: Lucrezia Europe M.D.   On: 03/07/2023 15:13     ASSESSMENT & PLAN: A total of 43 minutes was spent with the patient and counseling/coordination of care regarding preparing for this visit, review of most recent office visit notes, review of chronic medical conditions, pain management, review of all medications, review of x-ray reports done today, prognosis, documentation and need for follow-up.  Problem List Items Addressed This Visit       Cardiovascular and Mediastinum   Essential hypertension    Well-controlled hypertension. Continue lisinopril 40 mg daily, metoprolol succinate 50 mg daily, nifedipine 30 mg daily Cardiovascular risks associated with hypertension discussed. Dietary approaches to stop hypertension discussed.        Other   Dyslipidemia    Diet and nutrition discussed. Continue rosuvastatin 20 mg daily      Lumbar pain    Chronic lumbar pain. Unremarkable x-ray. No recent injuries. Pain management discussed. May need orthopedic evaluation.      Relevant Medications   acetaminophen-codeine (TYLENOL #3) 300-30 MG tablet   Other Relevant Orders   DG Lumbar Spine 2-3 Views (Completed)   History of arthritis   Acute pain of left shoulder - Primary    Active and affecting quality of life. Unremarkable x-ray Unremarkable physical exam Pain management discussed Recommend Tylenol for mild to moderate pain and Tylenol with codeine for moderate to severe pain May need orthopedic evaluation      Relevant Medications   acetaminophen-codeine  (TYLENOL #3) 300-30 MG tablet   Other Relevant Orders   DG Shoulder Left (  Completed)   Patient Instructions  Dolor en el hombro Shoulder Pain Muchas cosas pueden provocar dolor en el hombro, por ejemplo: Una lesin. Un movimiento del hombro que se repite [place] vez de la misma manera (uso excesivo). Dolor en las articulaciones (artritis). El dolor puede deberse a lo siguiente: Hinchazn e irritacin (inflamacin) de alguna parte del hombro. Una lesin en: La articulacin del hombro. Los tejidos que conectan el msculo al hueso (tendones). Los tejidos que [employer] s (ligamentos). [employer]. Siga estas indicaciones en su casa: Controle los cambios en sus sntomas. Informe a su mdico acerca de los cambios. Estas indicaciones pueden ayudarlo con Conservation officer, historic buildings. Si tiene un cabestrillo que se puede retirar: Radio broadcast assistant se lo haya indicado el mdico. Quteselo solamente como se lo haya indicado el mdico. Controle la piel alrededor del cabestrillo todos [place]. Comunquese con su mdico si ve algn problema. Afloje el cabestrillo si los dedos: [place]. Se adormecen. Se enfran. Mantenga el cabestrillo limpio. Si el cabestrillo no es impermeable: No deje que se moje. Qutese el cabestrillo para ducharse o baarse. Control del dolor, la rigidez y la hinchazn  Si se lo indican, aplique hielo sobre la zona dolorida. Ponga el hielo en una bolsa plstica. Coloque una toalla entre la piel y Therapist, nutritional. Aplique el hielo durante 20 minutos, 2 o 3 veces por da. Deje de aplicarse hielo si no ayuda a Best boy. Si la piel se le pone de color rojo brillante, quite el hielo de inmediato para evitar daos en la piel. El [place] de dao es mayor si no puede sentir dolor, Freight forwarder o fro. Apriete una pelota blanda o una almohadilla de goma tanto como sea posible. Esto impide que el hombro se hinche. Tambin ayuda a Veterinary surgeon. Indicaciones generales Use  los medicamentos de venta libre y los recetados solamente como se lo haya indicado el mdico. Concurra a todas las visitas de seguimiento. Esto lo ayudar a Dietitian tipo de problemas permanentes en el hombro. Comunquese con un mdico si: El [place] representative. Los medicamentos no Forensic psychologist. Siente un dolor nuevo en el brazo, la mano o los dedos. Se afloja el cabestrillo y el brazo, la mano o los dedos: [place]. Estn adormecidos. Estn hinchados. Solicite ayuda de inmediato si: El brazo, la mano o los dedos se tornan de color blanco o [place]. Esta informacin no tiene Marine scientist el consejo del mdico. Asegrese de hacerle al mdico cualquier pregunta que tenga. Document Revised: 08/14/2022 Document Reviewed: 08/14/2022 Elsevier Patient Education  [address], MD [facility]

## 2023-03-07 NOTE — Patient Instructions (Signed)
Dolor en el hombro Shoulder Pain Muchas cosas pueden provocar dolor en el hombro, por ejemplo: Una lesin. Un movimiento del hombro que se repite [place] vez de la misma manera (uso excesivo). Dolor en las articulaciones (artritis). El dolor puede deberse a lo siguiente: Hinchazn e irritacin (inflamacin) de alguna parte del hombro. Una lesin en: La articulacin del hombro. Los tejidos que conectan el msculo al hueso (tendones). Los tejidos que [employer] s (ligamentos). [employer]. Siga estas indicaciones en su casa: Controle los cambios en sus sntomas. Informe a su mdico acerca de los cambios. Estas indicaciones pueden ayudarlo con Conservation officer, historic buildings. Si tiene un cabestrillo que se puede retirar: Radio broadcast assistant se lo haya indicado el mdico. Quteselo solamente como se lo haya indicado el mdico. Controle la piel alrededor del cabestrillo todos [place]. Comunquese con su mdico si ve algn problema. Afloje el cabestrillo si los dedos: [place]. Se adormecen. Se enfran. Mantenga el cabestrillo limpio. Si el cabestrillo no es impermeable: No deje que se moje. Qutese el cabestrillo para ducharse o baarse. Control del dolor, la rigidez y la hinchazn  Si se lo indican, aplique hielo sobre la zona dolorida. Ponga el hielo en una bolsa plstica. Coloque una toalla entre la piel y Therapist, nutritional. Aplique el hielo durante 20 minutos, 2 o 3 veces por da. Deje de aplicarse hielo si no ayuda a Best boy. Si la piel se le pone de color rojo brillante, quite el hielo de inmediato para evitar daos en la piel. El [place] de dao es mayor si no puede sentir dolor, Freight forwarder o fro. Apriete una pelota blanda o una almohadilla de goma tanto como sea posible. Esto impide que el hombro se hinche. Tambin ayuda a Veterinary surgeon. Indicaciones generales Use los medicamentos de venta libre y los recetados solamente como se lo haya indicado el mdico. Concurra a todas  las visitas de seguimiento. Esto lo ayudar a Dietitian tipo de problemas permanentes en el hombro. Comunquese con un mdico si: El [place] representative. Los medicamentos no Forensic psychologist. Siente un dolor nuevo en el brazo, la mano o los dedos. Se afloja el cabestrillo y el brazo, la mano o los dedos: [place]. Estn adormecidos. Estn hinchados. Solicite ayuda de inmediato si: El brazo, la mano o los dedos se tornan de color blanco o [place]. Esta informacin no tiene Marine scientist el consejo del mdico. Asegrese de hacerle al mdico cualquier pregunta que tenga. Document Revised: 08/14/2022 Document Reviewed: 08/14/2022 Elsevier Patient Education  [place].

## 2023-03-08 ENCOUNTER — Telehealth: Payer: Self-pay | Admitting: *Deleted

## 2023-03-08 NOTE — Telephone Encounter (Signed)
Very sorry, as the "covering" doctor I would not be able to change or substitute a medication for this controlled substance, I can only do refills

## 2023-03-08 NOTE — Telephone Encounter (Signed)
Please send in refill request as Tylenol with Codeine is on back order.

## 2023-03-09 ENCOUNTER — Other Ambulatory Visit: Payer: Self-pay | Admitting: Emergency Medicine

## 2023-03-09 MED ORDER — TRAMADOL HCL 50 MG PO TABS: 50.0000 mg | ORAL_TABLET | Freq: Three times a day (TID) | ORAL | 1 refills | Status: AC | PRN

## 2023-03-09 NOTE — Telephone Encounter (Signed)
New prescription sent to pharmacy of record today.  Thanks.

## 2023-03-13 ENCOUNTER — Other Ambulatory Visit: Payer: Self-pay | Admitting: Emergency Medicine

## 2023-03-13 ENCOUNTER — Telehealth: Payer: Self-pay | Admitting: Emergency Medicine

## 2023-03-13 DIAGNOSIS — M25512 Pain in left shoulder: Secondary | ICD-10-CM

## 2023-03-13 DIAGNOSIS — M545 Low back pain, unspecified: Secondary | ICD-10-CM

## 2023-03-13 NOTE — Telephone Encounter (Signed)
Labs are normal.  Recommend orthopedic evaluation.  Referral placed today.  Results were also mailed.  Thanks.

## 2023-03-13 NOTE — Telephone Encounter (Signed)
Pt still having pain in arms, legs, and back. Pt would like Dr. Mitchel Honour to call her to go over her labs because she speak spanish.  Best call back # [phone]

## 2023-03-14 NOTE — Telephone Encounter (Signed)
Called patient w. Interpreter on the phone and left message for patient to call office to get lab results

## 2023-03-26 ENCOUNTER — Ambulatory Visit: Payer: Managed Care, Other (non HMO) | Admitting: Sports Medicine

## 2023-05-15 ENCOUNTER — Ambulatory Visit: Payer: Managed Care, Other (non HMO) | Admitting: Emergency Medicine

## 2023-05-21 ENCOUNTER — Ambulatory Visit: Payer: Managed Care, Other (non HMO) | Admitting: Emergency Medicine

## 2023-05-21 ENCOUNTER — Encounter: Payer: Self-pay | Admitting: Emergency Medicine

## 2023-05-21 VITALS — BP 128/82 | HR 65 | Temp 99.7°F | Ht 66.0 in | Wt 194.0 lb

## 2023-05-21 DIAGNOSIS — E785 Hyperlipidemia, unspecified: Secondary | ICD-10-CM | POA: Diagnosis not present

## 2023-05-21 DIAGNOSIS — R7303 Prediabetes: Secondary | ICD-10-CM | POA: Diagnosis not present

## 2023-05-21 DIAGNOSIS — Z13820 Encounter for screening for osteoporosis: Secondary | ICD-10-CM

## 2023-05-21 DIAGNOSIS — Z1231 Encounter for screening mammogram for malignant neoplasm of breast: Secondary | ICD-10-CM

## 2023-05-21 DIAGNOSIS — I779 Disorder of arteries and arterioles, unspecified: Secondary | ICD-10-CM

## 2023-05-21 DIAGNOSIS — I10 Essential (primary) hypertension: Secondary | ICD-10-CM | POA: Diagnosis not present

## 2023-05-21 DIAGNOSIS — K219 Gastro-esophageal reflux disease without esophagitis: Secondary | ICD-10-CM

## 2023-05-21 DIAGNOSIS — H9202 Otalgia, left ear: Secondary | ICD-10-CM | POA: Insufficient documentation

## 2023-05-21 DIAGNOSIS — H6692 Otitis media, unspecified, left ear: Secondary | ICD-10-CM | POA: Insufficient documentation

## 2023-05-21 LAB — LIPID PANEL
Cholesterol: 172 mg/dL (ref 0–200)
HDL: 58.4 mg/dL (ref >39.00)
LDL Cholesterol: 97 mg/dL (ref 0–99)
NonHDL: 114.02
Total CHOL/HDL Ratio: 3
Triglycerides: 83 mg/dL (ref 0.0–149.0)
VLDL: 16.6 mg/dL (ref 0.0–40.0)

## 2023-05-21 LAB — CBC WITH DIFFERENTIAL/PLATELET
Basophils Absolute: 0.1 10*3/uL (ref 0.0–0.1)
Basophils Relative: 1 % (ref 0.0–3.0)
Eosinophils Absolute: 0.2 10*3/uL (ref 0.0–0.7)
Eosinophils Relative: 3.9 % (ref 0.0–5.0)
HCT: 36.7 % (ref 36.0–46.0)
Hemoglobin: 11.9 g/dL — ABNORMAL LOW (ref 12.0–15.0)
Lymphocytes Relative: 36.2 % (ref 12.0–46.0)
Lymphs Abs: 2.1 10*3/uL (ref 0.7–4.0)
MCHC: 32.5 g/dL (ref 30.0–36.0)
MCV: 76.8 fl — ABNORMAL LOW (ref 78.0–100.0)
Monocytes Absolute: 0.4 10*3/uL (ref 0.1–1.0)
Monocytes Relative: 7.5 % (ref 3.0–12.0)
Neutro Abs: 3 10*3/uL (ref 1.4–7.7)
Neutrophils Relative %: 51.4 % (ref 43.0–77.0)
Platelets: 274 10*3/uL (ref 150.0–400.0)
RBC: 4.78 Mil/uL (ref 3.87–5.11)
RDW: 15 % (ref 11.5–15.5)
WBC: 5.8 10*3/uL (ref 4.0–10.5)

## 2023-05-21 LAB — COMPREHENSIVE METABOLIC PANEL
ALT: 22 U/L (ref 0–35)
AST: 22 U/L (ref 0–37)
Albumin: 4 g/dL (ref 3.5–5.2)
Alkaline Phosphatase: 94 U/L (ref 39–117)
BUN: 11 mg/dL (ref 6–23)
CO2: 29 mEq/L (ref 19–32)
Calcium: 9.7 mg/dL (ref 8.4–10.5)
Chloride: 103 mEq/L (ref 96–112)
Creatinine, Ser: 0.6 mg/dL (ref 0.40–1.20)
GFR: 94.01 mL/min (ref >60.00)
Glucose, Bld: 85 mg/dL (ref 70–99)
Potassium: 4 mEq/L (ref 3.5–5.1)
Sodium: 138 mEq/L (ref 135–145)
Total Bilirubin: 0.3 mg/dL (ref 0.2–1.2)
Total Protein: 7.5 g/dL (ref 6.0–8.3)

## 2023-05-21 LAB — VITAMIN D 25 HYDROXY (VIT D DEFICIENCY, FRACTURES): VITD: 47.5 ng/mL (ref 30.00–100.00)

## 2023-05-21 LAB — HEMOGLOBIN A1C: Hgb A1c MFr Bld: 5.8 % (ref 4.6–6.5)

## 2023-05-21 MED ORDER — HYDROCORTISONE-ACETIC ACID 1-2 % OT SOLN: 3.0000 [drp] | Freq: Three times a day (TID) | OTIC | 1 refills | Status: AC

## 2023-05-21 MED ORDER — AMOXICILLIN-POT CLAVULANATE 875-125 MG PO TABS: 1.0000 | ORAL_TABLET | Freq: Two times a day (BID) | ORAL | 0 refills | Status: AC

## 2023-05-21 NOTE — Assessment & Plan Note (Signed)
Pain management discussed Recommend Advil dual action as needed Start VoSol eardrops as prescribed

## 2023-05-21 NOTE — Assessment & Plan Note (Signed)
Stable.  Continues Nexium 40 mg as needed

## 2023-05-21 NOTE — Progress Notes (Signed)
Emily [name] 67 y.o.   Chief Complaint  Patient presents with   Ear Pain    Left ear pain. Pt states her throat has gotten a little better but still feels her left side of throat hurts. Has questions about a mammo.    HISTORY OF PRESENT ILLNESS: This is a 67 y.o. female complaining of persistent left ear pain.  Status post recent URI. Also here for follow-up of chronic medical conditions including hypertension, prediabetes, and dyslipidemia Requesting referrals for mammograms and osteoporosis screening Other complaints or medical concerns today.  HPI   Prior to Admission medications   Medication Sig Start Date End Date Taking? Authorizing Provider  acetic acid-hydrocortisone (VOSOL-HC) OTIC solution Place 3 drops into the left ear 3 (three) times daily. 05/21/23  Yes SagardiaEilleen Kempf, MD  amoxicillin-clavulanate (AUGMENTIN) 875-125 MG tablet Take 1 tablet by mouth 2 (two) times daily for 7 days. 05/21/23 05/28/23 Yes Ayodele Hartsock, Eilleen Kempf, MD  ALPRAZolam Prudy Feeler) 0.5 MG tablet TAKE 1 TABLET BY MOUTH EVERY DAY AS NEEDED FOR ANXIETY 12/01/22   Georgina Quint, MD  Ascorbic Acid (VITAMIN C) 1000 MG tablet Take 1,000 mg by mouth daily.    [provider]  CALCIUM PO Take 1 tablet by mouth daily.     [provider]  Cholecalciferol (VITAMIN D3 PO) Take 125 mg by mouth daily.    [provider]  ciclopirox (PENLAC) 8 % solution Apply topically at bedtime. Apply over nail and surrounding skin. Apply daily over previous coat. After seven (7) days, may remove with alcohol and continue cycle. 11/12/22   Edwin Cap, DPM  Coenzyme Q10 (CO Q-10 PO) Take 18 mg by mouth daily.    [provider]  cyclobenzaprine (FLEXERIL) 5 MG tablet     [provider]  esomeprazole (NEXIUM) 40 MG capsule Take 40 mg by mouth daily at 12 noon.    [provider]  famotidine (PEPCID) 20 MG tablet Take 20 mg by mouth at bedtime.    [provider]  lisinopril (ZESTRIL) 40 MG tablet TAKE 1 TABLET BY MOUTH EVERY DAY 02/28/23   Georgina Quint, MD  metoprolol succinate (TOPROL-XL) 50 MG 24 hr tablet TAKE 1 TABLET BY MOUTH DAILY. TAKE WITH OR IMMEDIATELY FOLLOWING A MEAL. 10/11/22   Georgina Quint, MD  Multiple Vitamin (MULTI-VITAMIN DAILY PO) Take by mouth daily. TAKE 1 TABLET DAILY    [provider]  NIFEdipine (ADALAT CC) 30 MG 24 hr tablet     [provider]  rosuvastatin (CRESTOR) 20 MG tablet TAKE 1 TABLET BY MOUTH EVERY DAY 07/11/22   Georgina Quint, MD  Turmeric (QC TUMERIC COMPLEX PO) Take by mouth daily. NATURAL SUPPLEMENT    [provider]    Allergies  Allergen Reactions   Ibuprofen    Aspirin Hives   Atorvastatin     Yellow fingernails   Nsaids Hives    Patient Active Problem List   Diagnosis Date Noted   Acute pain of left shoulder 03/07/2023   Onychomycosis 10/30/2022   Prediabetes 02/12/2022   Hypertensive urgency 11/28/2021   History of arthritis 08/15/2020   History of gastroesophageal reflux (GERD) 08/15/2020   Lumbar pain 06/30/2019   Carotid artery disease (HCC) 12/19/2018   Bilateral carotid artery stenosis, R- 40-59%, L 1-39% 11/2017 03/25/2018   Dyslipidemia 03/25/2018   GERD (gastroesophageal reflux disease) 04/12/2016   Dyspepsia 11/02/2015   Chronic high back pain 09/24/2015   Essential hypertension  10/03/2012   Hyperlipidemia 10/03/2012   DYSPEPSIA&OTHER SPEC DISORDERS FUNCTION STOMACH 07/20/2009    Past Medical History:  Diagnosis Date   Abdominal pain, chronic, epigastric 11/02/2015   Started amitriptyline at hospitalization 04/13/2016 EGD x 2 negative Also w/ chronic chest wall and back pain Would try not to work up further    Anxiety    Arthritis    BACK,KNEES   Beta thalassemia minor    diagnosed by hematology Dr. Dorann Lodge, also with sickle cell trait   Blood transfusion without reported diagnosis    "LONG TIME AGO"    Chronic high back pain 09/24/2015   Colon polyps    GERD (gastroesophageal reflux disease)    Heart murmur    "SMALL DOESN'T BOTHER ME"   Hyperlipidemia    Hypertension    Sickle cell trait (HCC)    diagnosed by hematology Dr. Pamelia Hoit, coexsisting with beta-thalassemia minor    Past Surgical History:  Procedure Laterality Date   ABDOMINAL HYSTERECTOMY     1998   COLONOSCOPY     ESOPHAGOGASTRODUODENOSCOPY     ESOPHAGOGASTRODUODENOSCOPY [name] 04/13/2016   Procedure: ESOPHAGOGASTRODUODENOSCOPY (EGD);  Surgeon: Iva Boop, MD;  Location: [facility];  Service: Endoscopy;  Laterality: [name];    Social History   Socioeconomic History   Marital status: Married    Spouse name: Not on file   Number of children: 3   Years of education: Not on file   Highest education level: Not on file  Occupational History   Not on file  Tobacco Use   Smoking status: Never    Passive exposure: Never   Smokeless tobacco: Never  Vaping Use   Vaping Use: Never used  Substance and Sexual Activity   Alcohol use: No    Alcohol/week: 0.0 standard drinks of alcohol   Drug use: No   Sexual activity: Yes    Comment: 1st intercourse- 20, partner-1, married- 42 yrs   Other Topics Concern   Not on file  Social History Narrative   Lives with husband and 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  Negative for chills  and fever.  HENT:  Positive for ear pain.   Respiratory: Negative.  Negative for cough and shortness of breath.   Cardiovascular: Negative.  Negative for chest pain and palpitations.  Gastrointestinal:  Negative for abdominal pain, diarrhea, nausea and vomiting.  Musculoskeletal:  Positive for back pain and joint pain.  Skin: Negative.  Negative for rash.  Neurological: Negative.  Negative for dizziness and headaches.  All other systems reviewed and are negative.   Vitals:   05/21/23 0951  BP: 128/82  Pulse: 65  Temp: 99.7 F (37.6 C)  SpO2: 96%    Physical Exam Vitals reviewed.  Constitutional:  Appearance: Normal appearance.  HENT:     Head: Normocephalic.     Right Ear: Tympanic membrane, ear canal and external ear normal.     Left Ear: Tympanic membrane is injected.     Mouth/Throat:     Mouth: Mucous membranes are moist.     Pharynx: Oropharynx is clear.  Eyes:     Extraocular Movements: Extraocular movements intact.     Pupils: Pupils are equal, round, and reactive to light.  Neck:     Comments: Positive left submandibular adenopathy Cardiovascular:     Rate and Rhythm: Normal rate and regular rhythm.     Pulses: Normal pulses.     Heart sounds: Normal heart sounds.  Pulmonary:     Effort: Pulmonary effort is normal.     Breath sounds: Normal breath sounds.  Skin:    General: Skin is warm and dry.  Neurological:     Mental Status: She is alert and oriented to person, place, and time.  Psychiatric:        Mood and Affect: Mood normal.        Behavior: Behavior normal.      ASSESSMENT & PLAN: A total of 44 minutes was spent with the patient and counseling/coordination of care regarding preparing for this visit, review of most recent office visit notes, review of most recent blood work results, review of multiple chronic medical conditions and their management, review of all medications, diagnosis of acute otitis media and need for antibiotics, prognosis,  documentation, and need for follow-up.  Problem List Items Addressed This Visit       Cardiovascular and Mediastinum   Essential hypertension - Primary    BP Readings from Last 3 Encounters:  05/21/23 128/82  03/07/23 124/82  10/30/22 120/80  Well-controlled hypertension Continue lisinopril 40 mg and metoprolol succinate 50 mg daily Cardiovascular risks associated with hypertension discussed Dietary approaches to stop hypertension discussed       Relevant Orders   CBC with Differential/Platelet   Comprehensive metabolic panel   VITAMIN D 25 Hydroxy (Vit-D Deficiency, Fractures)   Carotid artery disease (HCC)    Stable and asymptomatic        Digestive   GERD (gastroesophageal reflux disease)    Stable.  Continues Nexium 40 mg as needed        Nervous and Auditory   Acute otalgia, left    Pain management discussed Recommend Advil dual action as needed Start VoSol eardrops as prescribed      Relevant Medications   acetic acid-hydrocortisone (VOSOL-HC) OTIC solution   Left otitis media    Complication from recent URI Left submandibular/cervical adenopathy related to this Recommend to start Augmentin 875 mg twice a day for 7 days      Relevant Medications   amoxicillin-clavulanate (AUGMENTIN) 875-125 MG tablet     Other   Dyslipidemia    Stable Diet and nutrition discussed Continue rosuvastatin 20 mg daily Lipid profile done today      Relevant Orders   Lipid panel   Prediabetes    Diet and nutrition discussed Hemoglobin A1c done today Advised to decrease amount of daily carbohydrate intake and daily calories and increase amount of plant-based protein in her diet Benefits of exercise discussed      Relevant Orders   Hemoglobin A1c   Insulin, random   Other Visit Diagnoses     Screening mammogram for breast cancer       Relevant Orders   MM Digital Screening  Osteoporosis screening       Relevant Orders   HM DEXA SCAN (Completed)       Patient Instructions  Otitis media en los adultos Otitis Media, Adult  La otitis media es una afeccin que se caracteriza porque el odo medio est rojo e hinchado (inflamado) y lleno de lquido. El odo medio es la parte del odo que contiene los huesos de la audicin, as Neurosurgeon aire que ayuda a Corporate treasurer los sonidos al cerebro. Generalmente, la afeccin desaparece sin tratamiento. Cules son las causas? Esta afeccin es consecuencia de una obstruccin en la trompa de [place]. La trompa conecta el odo medio con la parte posterior de la [place]. Normalmente, permite que el aire entre en el [facility]. La causa de la obstruccin es el lquido o la hinchazn. Algunos de los problemas que pueden causar Neomia Dear obstruccin son los siguientes: Un resfro o infeccin que afecta la nariz, la boca o la garganta. Alergias. Un irritante, como el humo del tabaco. Adenoides que se han agrandado. Las adenoides son tejido blando ubicado en la parte posterior de la garganta, detrs de la nariz y Advice worker. Crecimiento o hinchazn en la parte superior de la garganta, justo detrs de la nariz (nasofaringe). Dao en el odo a causa de un cambio en la presin. Esto se denomina barotraumatismo. Qu incrementa el riesgo? Es ms probable que tenga esta afeccin si: Fuma o se expone al humo de tabaco. Tiene una abertura en la parte superior de la boca (hendidura del paladar). Tiene reflujo cido. Tiene problemas en el sistema de defensa del cuerpo (sistema inmunitario). Cules son los signos o sntomas? Los sntomas de esta afeccin incluyen: Dolor de odo. Grant Ruts. Problemas para or. Cansancio. Supuracin de lquido por el odo. Zumbidos en el odo. Cmo se trata? Esta afeccin puede desaparecer sin tratamiento en el transcurso de 3 a 5 das. Sin embargo, si la afeccin est causada por bacterias y no desaparece sin tratamiento, o si vuelve a aparecer ms de una vez, el mdico puede hacer lo  siguiente: Recetarle antibiticos. Administrarle analgsicos. Siga estas indicaciones en su casa: Use los medicamentos de venta libre y los recetados solamente como se lo haya indicado el mdico. Si le recetaron un antibitico, tmelo como se lo haya indicado el mdico. No deje de tomarlo aunque comience a sentirse mejor. Concurra a todas las visitas de seguimiento. Comunquese con un mdico si: Le sangra la nariz. Tiene un bulto en el cuello. No se siente mejor al cabo de 5 das. Empeora en lugar de mejorar. Solicite ayuda de inmediato si: Siente dolor que no se [employer]. Tiene hinchazn, enrojecimiento o dolor en el odo. Tiene rigidez en el cuello. No puede mover una parte de su rostro (parlisis). Nota que el hueso que se encuentra detrs de la oreja le duele al tocarlo. Siente un dolor de cabeza muy intenso. Resumen Otitis media significa que el odo medio est rojo, hinchado y lleno de lquido. Generalmente, esta afeccin desaparece sin tratamiento. Si el problema no desaparece, puede ser necesario Pensions consultant. Es posible que le den medicamentos para tratar la infeccin o para Corporate treasurer. Si le recetaron un antibitico, tmelo como se lo haya indicado el mdico. No deje de tomarlo aunque comience a sentirse mejor. Concurra a todas las visitas de seguimiento. Esta informacin no tiene Theme park manager el consejo del mdico. Asegrese de hacerle al mdico cualquier pregunta que tenga. Document Revised: 04/14/2021 Document Reviewed: 04/14/2021 Elsevier  Patient Education  2024 Elsevier Inc.     Edwina Barth, MD [facility]

## 2023-05-21 NOTE — Assessment & Plan Note (Signed)
Complication from recent URI Left submandibular/cervical adenopathy related to this Recommend to start Augmentin 875 mg twice a day for 7 days

## 2023-05-21 NOTE — Assessment & Plan Note (Signed)
Stable.  Diet and nutrition discussed. Continue rosuvastatin 20 mg daily. Lipid profile done today. 

## 2023-05-21 NOTE — Patient Instructions (Signed)
Otitis media en los adultos Otitis Media, Adult  La otitis media es una afeccin que se caracteriza porque el odo medio est rojo e hinchado (inflamado) y lleno de lquido. El odo medio es la parte del odo que contiene los huesos de la audicin, as Neurosurgeon aire que ayuda a Corporate treasurer los sonidos al cerebro. Generalmente, la afeccin desaparece sin tratamiento. Cules son las causas? Esta afeccin es consecuencia de una obstruccin en la trompa de [place]. La trompa conecta el odo medio con la parte posterior de la [place]. Normalmente, permite que el aire entre en el [facility]. La causa de la obstruccin es el lquido o la hinchazn. Algunos de los problemas que pueden causar Neomia Dear obstruccin son los siguientes: Un resfro o infeccin que afecta la nariz, la boca o la garganta. Alergias. Un irritante, como el humo del tabaco. Adenoides que se han agrandado. Las adenoides son tejido blando ubicado en la parte posterior de la garganta, detrs de la nariz y Advice worker. Crecimiento o hinchazn en la parte superior de la garganta, justo detrs de la nariz (nasofaringe). Dao en el odo a causa de un cambio en la presin. Esto se denomina barotraumatismo. Qu incrementa el riesgo? Es ms probable que tenga esta afeccin si: Fuma o se expone al humo de tabaco. Tiene una abertura en la parte superior de la boca (hendidura del paladar). Tiene reflujo cido. Tiene problemas en el sistema de defensa del cuerpo (sistema inmunitario). Cules son los signos o sntomas? Los sntomas de esta afeccin incluyen: Dolor de odo. Grant Ruts. Problemas para or. Cansancio. Supuracin de lquido por el odo. Zumbidos en el odo. Cmo se trata? Esta afeccin puede desaparecer sin tratamiento en el transcurso de 3 a 5 das. Sin embargo, si la afeccin est causada por bacterias y no desaparece sin tratamiento, o si vuelve a aparecer ms de una vez, el mdico puede hacer lo siguiente: Recetarle  antibiticos. Administrarle analgsicos. Siga estas indicaciones en su casa: Use los medicamentos de venta libre y los recetados solamente como se lo haya indicado el mdico. Si le recetaron un antibitico, tmelo como se lo haya indicado el mdico. No deje de tomarlo aunque comience a sentirse mejor. Concurra a todas las visitas de seguimiento. Comunquese con un mdico si: Le sangra la nariz. Tiene un bulto en el cuello. No se siente mejor al cabo de 5 das. Empeora en lugar de mejorar. Solicite ayuda de inmediato si: Siente dolor que no se [employer]. Tiene hinchazn, enrojecimiento o dolor en el odo. Tiene rigidez en el cuello. No puede mover una parte de su rostro (parlisis). Nota que el hueso que se encuentra detrs de la oreja le duele al tocarlo. Siente un dolor de cabeza muy intenso. Resumen Otitis media significa que el odo medio est rojo, hinchado y lleno de lquido. Generalmente, esta afeccin desaparece sin tratamiento. Si el problema no desaparece, puede ser necesario Pensions consultant. Es posible que le den medicamentos para tratar la infeccin o para Corporate treasurer. Si le recetaron un antibitico, tmelo como se lo haya indicado el mdico. No deje de tomarlo aunque comience a sentirse mejor. Concurra a todas las visitas de seguimiento. Esta informacin no tiene Theme park manager el consejo del mdico. Asegrese de hacerle al mdico cualquier pregunta que tenga. Document Revised: 04/14/2021 Document Reviewed: 04/14/2021 Elsevier Patient Education  2024 [employer].

## 2023-05-21 NOTE — Assessment & Plan Note (Signed)
Diet and nutrition discussed Hemoglobin A1c done today Advised to decrease amount of daily carbohydrate intake and daily calories and increase amount of plant-based protein in her diet Benefits of exercise discussed

## 2023-05-21 NOTE — Assessment & Plan Note (Signed)
Stable and asymptomatic  

## 2023-05-21 NOTE — Assessment & Plan Note (Signed)
BP Readings from Last 3 Encounters:  05/21/23 128/82  03/07/23 124/82  10/30/22 120/80  Well-controlled hypertension Continue lisinopril 40 mg and metoprolol succinate 50 mg daily Cardiovascular risks associated with hypertension discussed Dietary approaches to stop hypertension discussed

## 2023-05-22 LAB — INSULIN, RANDOM: Insulin: 11 u[IU]/mL

## 2023-06-11 ENCOUNTER — Ambulatory Visit
Admission: RE | Admit: 2023-06-11 | Discharge: 2023-06-11 | Disposition: A | Discharge status: Home / Self Care | Payer: Managed Care, Other (non HMO) | Source: Ambulatory Visit | Attending: Emergency Medicine

## 2023-06-11 DIAGNOSIS — Z1231 Encounter for screening mammogram for malignant neoplasm of breast: Secondary | ICD-10-CM

## 2023-06-13 ENCOUNTER — Ambulatory Visit: Payer: Managed Care, Other (non HMO) | Admitting: Sports Medicine

## 2023-06-13 ENCOUNTER — Encounter: Payer: Self-pay | Admitting: Sports Medicine

## 2023-06-13 DIAGNOSIS — M2142 Flat foot [pes planus] (acquired), left foot: Secondary | ICD-10-CM

## 2023-06-13 DIAGNOSIS — M2141 Flat foot [pes planus] (acquired), right foot: Secondary | ICD-10-CM

## 2023-06-13 DIAGNOSIS — G8929 Other chronic pain: Secondary | ICD-10-CM | POA: Diagnosis not present

## 2023-06-13 DIAGNOSIS — M12811 Other specific arthropathies, not elsewhere classified, right shoulder: Secondary | ICD-10-CM

## 2023-06-13 DIAGNOSIS — M25511 Pain in right shoulder: Secondary | ICD-10-CM

## 2023-06-13 DIAGNOSIS — M25512 Pain in left shoulder: Secondary | ICD-10-CM

## 2023-06-13 DIAGNOSIS — M19012 Primary osteoarthritis, left shoulder: Secondary | ICD-10-CM

## 2023-06-13 DIAGNOSIS — M25561 Pain in right knee: Secondary | ICD-10-CM

## 2023-06-13 MED ORDER — BUPIVACAINE HCL 0.25 % IJ SOLN
2.0000 mL | INTRAMUSCULAR | Status: AC | PRN
  Administered 2023-06-13: 2 mL via INTRA_ARTICULAR

## 2023-06-13 MED ORDER — METHYLPREDNISOLONE ACETATE 40 MG/ML IJ SUSP
40.0000 mg | INTRAMUSCULAR | Status: AC | PRN
  Administered 2023-06-13: 40 mg via INTRA_ARTICULAR

## 2023-06-13 MED ORDER — LIDOCAINE HCL 1 % IJ SOLN
2.0000 mL | INTRAMUSCULAR | Status: AC | PRN
  Administered 2023-06-13: 2 mL

## 2023-06-13 NOTE — Progress Notes (Signed)
Emily [name] - 67 y.o. female MRN [id number]  Date of birth: Jan 12, 1956  Office Visit Note: Visit Date: 06/13/2023 PCP: Georgina Quint, MD Referred by: Georgina Quint, *  Subjective: Chief Complaint  Patient presents with   pain, multiple sites   HPI: Emily [name] is a pleasant 67 y.o. female who presents today for L > R shoulder pain, left foot/arch pain. The use of an in-person Spanish interpreter was used through the entirety of the visit today.  Both of her shoulders have bothered her for the last month or more, the left is worse than the right.  Feels the pain over the anterior and lateral side.  She works for [employer] and does a lot of reaching which bothers her pain.  Denies any numbness or tingling.  Has pain over the left arch.  She states about 20 years ago she had a plantar fascia injection which helped relieve her pain.  Her pain today is more so in the arch as opposed to the heel.  No redness or swelling.  History of chronic right knee pain, recently has been doing okay but years ago she had an injection which helped.  She has an allergy to NSAIDs with GERD and possibly some increase in her blood pressure.  She is taking Tylenol and using topical creams.  She is not diabetic. Last A1c was 5.8 on 05/21/23.  Pertinent ROS were reviewed with the patient and found to be negative unless otherwise specified above in HPI.   Assessment & Plan: Visit Diagnoses:  1. Chronic pain of both shoulders   2. Rotator cuff arthropathy of both shoulders   3. Primary osteoarthritis, left shoulder   4. Pes planus of both feet   5. Chronic pain of right knee    Plan: She does have some mild arthritis about the left shoulder, but her exam is much more suggestive of rotator cuff versus subacromial bursitis like pain.  Discussed formal physical therapy, oral medications, home rehab and injection therapy.  Patient would like to proceed with corticosteroid  injections as she has had good relief with other ailments in the past.  Did proceed with bilateral subacromial joint injections, patient tolerated well.  She has pain over the arch of the left foot, she has no pain at the heel near the plantar fascia insertion.  I did fit her for insoles today to help support her arch.  She will also use topical Voltaren gel over this area.  May continue Tylenol and/or icing.  Her right knee was doing okay, we did not repeat imaging today.  I would like her to follow-up in about 1 month to see how the shoulders and the arch of the foot is doing.  If for some reason the right knee continues to bother her we may obtain new x-rays to further evaluate this.  Additional treatment considerations: Shockwave therapy to the plantar fascia and arch, trial of Celebrex although would need to clarify her allergy to ibuprofen  Follow-up: Return in about 1 month (around 07/13/2023) for b/l shoulders, left foot (30-min for poss inj).   Meds & Orders: No orders of the defined types were placed in this encounter.   Orders Placed This Encounter  Procedures   Large Joint Inj: bilateral subacromial bursa     Procedures: Large Joint Inj: bilateral subacromial bursa on 06/13/2023 3:25 PM Indications: pain Details: 22 G 1.5 in needle, posterior approach Medications (Right): 2 mL lidocaine 1 %; 2 mL bupivacaine  0.25 %; 40 mg methylPREDNISolone acetate 40 MG/ML Medications (Left): 2 mL lidocaine 1 %; 2 mL bupivacaine 0.25 %; 40 mg methylPREDNISolone acetate 40 MG/ML Outcome: tolerated well, no immediate complications  Subacromial Joint Injection, left Shoulder After discussion on risks/benefits/indications, informed verbal consent was obtained. A timeout was then performed. Patient was seated on table in exam room. The patient's shoulder was prepped with betadine and alcohol swabs and utilizing posterior approach a 22G, 1.5" needle was directed anteriorly and laterally into the patient's  subacromial space was injected with 2:2:1 mixture of lidocaine:bupivicaine:depomedrol with appreciation of free-flowing of the injectate into the bursal space. Patient tolerated the procedure well without immediate complications.   Subcromial Joint Injection, Right Shoulder After discussion on risks/benefits/indications, informed verbal consent was obtained. A timeout was then performed. Patient was seated on table in exam room. The patient's shoulder was prepped with betadine and alcohol swabs and utilizing posterior approach a 22G, 1.5" needle was directed anteriorly and laterally into the patient's subacromial space was injected with 2:2:1 mixture of lidocaine:bupivicaine:depomedrol with appreciation of free-flowing of the injectate into the bursal space. Patient tolerated the procedure well without immediate complications.  Procedure, treatment alternatives, risks and benefits explained, specific risks discussed. Consent was given by the patient. Immediately prior to procedure a time out was called to verify the correct patient, procedure, equipment, support staff and site/side marked as required. Patient was prepped and draped in the usual sterile fashion.          Clinical History: No specialty comments available.  She reports that she has never smoked. She has never been exposed to tobacco smoke. She has never used smokeless tobacco.  Recent Labs    10/30/22 1613 05/21/23 1102  HGBA1C 6.0 5.8    Objective:    Physical Exam  Gen: Well-appearing, in no acute distress; non-toxic CV: Well-perfused. Warm.  Resp: Breathing unlabored on room air; no wheezing. Psych: Fluid speech in conversation; appropriate affect; normal thought process Neuro: Sensation intact throughout. No gross coordination deficits.   Ortho Exam - Bilateral shoulders: + Pain at Codman's point.  No AC joint TTP.  There is full active and passive range of motion but pain with endrange abduction and external rotation.   Positive drop arm, positive empty can and pain with resisted ER.  Positive Hawkins impingement test bilaterally.  - Left foot: No redness or swelling, negative calcaneal heel squeeze.  There is pain over the medial arch and the plantar aspect of the foot near the medial band of the plantar fascia.  - Gait/Stance: Moderate to severe pes planovalgus that is flexible with flattening of the longitudinal arch upon standing.  Imaging:  3 views of the left shoulder or independently reviewed and interpreted by myself today.  AP, axial and scapular Y views of were reviewed and interpreted.  These show at least moderate glenohumeral joint arthritic change, there is mild AC joint arthropathy.  Humeral head is well located within the glenohumeral joint.  No acute fracture or other bony abnormality noted.  Narrative & Impression  CLINICAL DATA:  Left shoulder pain   EXAM: LEFT SHOULDER - 2+ VIEW   COMPARISON:  None Available.   FINDINGS: There is no evidence of fracture or dislocation. There is no evidence of arthropathy or other focal bone abnormality. Soft tissues are unremarkable.   IMPRESSION: Negative.     Electronically Signed   By: Corlis Leak M.D.   On: 03/07/2023 15:13   Narrative & Impression  CLINICAL DATA:  Low back and left shoulder pain   EXAM: LUMBAR SPINE - 2-3 VIEW   COMPARISON:  CT 11/22/2017   FINDINGS: Mild lumbar dextroscoliosis apex L2 without evident underlying vertebral anomaly. No significant spondylolisthesis. No fracture or dislocation. Mild narrowing of interspaces L1-L4 with small anterior endplate spurs.   IMPRESSION: 1. No acute findings. 2. Mild lumbar dextroscoliosis and multilevel degenerative change.     Electronically Signed   By: Corlis Leak M.D.   On: 03/07/2023 15:14    Past Medical/Family/Surgical/Social History: Medications & Allergies reviewed per EMR, new medications updated. Patient Active Problem List   Diagnosis Date Noted    Acute otalgia, left 05/21/2023   Left otitis media 05/21/2023   Acute pain of left shoulder 03/07/2023   Onychomycosis 10/30/2022   Prediabetes 02/12/2022   Hypertensive urgency 11/28/2021   History of arthritis 08/15/2020   History of gastroesophageal reflux (GERD) 08/15/2020   Lumbar pain 06/30/2019   Carotid artery disease (HCC) 12/19/2018   Bilateral carotid artery stenosis, R- 40-59%, L 1-39% 11/2017 03/25/2018   Dyslipidemia 03/25/2018   GERD (gastroesophageal reflux disease) 04/12/2016   Dyspepsia 11/02/2015   Chronic high back pain 09/24/2015   Essential hypertension 10/03/2012   Hyperlipidemia 10/03/2012   DYSPEPSIA&OTHER SPEC DISORDERS FUNCTION STOMACH 07/20/2009   Past Medical History:  Diagnosis Date   Abdominal pain, chronic, epigastric 11/02/2015   Started amitriptyline at hospitalization 04/13/2016 EGD x 2 negative Also w/ chronic chest wall and back pain Would try not to work up further    Anxiety    Arthritis    BACK,KNEES   Beta thalassemia minor    diagnosed by hematology Dr. Dorann Lodge, also with sickle cell trait   Blood transfusion without reported diagnosis    "LONG TIME AGO"   Chronic high back pain 09/24/2015   Colon polyps    GERD (gastroesophageal reflux disease)    Heart murmur    "SMALL DOESN'T BOTHER ME"   Hyperlipidemia    Hypertension    Sickle cell trait (HCC)    diagnosed by hematology Dr. Pamelia Hoit, coexsisting with beta-thalassemia minor   Family History  Problem Relation Age of Onset   Aneurysm Mother    Diabetes Father    Colon polyps Brother    Colon cancer Brother    Liver cancer Brother    Colon cancer Nephew    Esophageal cancer Neg Hx    Stomach cancer Neg Hx    Pancreatic cancer Neg Hx    Rectal cancer Neg Hx    Breast cancer Neg Hx    Past Surgical History:  Procedure Laterality Date   ABDOMINAL HYSTERECTOMY     1998   COLONOSCOPY     ESOPHAGOGASTRODUODENOSCOPY     ESOPHAGOGASTRODUODENOSCOPY [name] 04/13/2016    Procedure: ESOPHAGOGASTRODUODENOSCOPY (EGD);  Surgeon: Iva Boop, MD;  Location: [facility];  Service: Endoscopy;  Laterality: [name];   Social History   Occupational History   Not on file  Tobacco Use   Smoking status: Never    Passive exposure: Never   Smokeless tobacco: Never  Vaping Use   Vaping Use: Never used  Substance and Sexual Activity   Alcohol use: No    Alcohol/week: 0.0 standard drinks of alcohol   Drug use: No   Sexual activity: Yes    Comment: 1st intercourse- 71, partner-1, married- 42 yrs

## 2023-07-10 ENCOUNTER — Encounter: Payer: Self-pay | Admitting: Emergency Medicine

## 2023-07-10 ENCOUNTER — Ambulatory Visit (INDEPENDENT_AMBULATORY_CARE_PROVIDER_SITE_OTHER): Payer: Managed Care, Other (non HMO) | Admitting: Emergency Medicine

## 2023-07-10 VITALS — BP 138/82 | HR 70 | Temp 98.9°F | Ht 66.0 in | Wt 194.4 lb

## 2023-07-10 DIAGNOSIS — I10 Essential (primary) hypertension: Secondary | ICD-10-CM | POA: Diagnosis not present

## 2023-07-10 DIAGNOSIS — R7303 Prediabetes: Secondary | ICD-10-CM

## 2023-07-10 DIAGNOSIS — E785 Hyperlipidemia, unspecified: Secondary | ICD-10-CM | POA: Diagnosis not present

## 2023-07-10 NOTE — Assessment & Plan Note (Signed)
Diet and nutrition discussed Cardiovascular risks associated with diabetes discussed Advised to decrease amount of daily carbohydrate intake and daily calories and increase amount of plant-based protein in her diet

## 2023-07-10 NOTE — Patient Instructions (Signed)
Hipertensin en los adultos Hypertension, Adult El trmino hipertensin es otra forma de denominar a la presin arterial elevada. La presin arterial elevada fuerza al corazn a trabajar ms para bombear la sangre. Esto puede causar problemas con el paso del tiempo. Una lectura de presin arterial est compuesta por 2 nmeros. Hay un nmero superior (sistlico) sobre un nmero inferior (diastlico). Lo ideal es tener la presin arterial por debajo de 120/80. Cules son las causas? Se desconoce la causa de esta afeccin. Algunas otras afecciones pueden provocar presin arterial elevada. Qu incrementa el riesgo? Algunos factores del estilo de vida pueden hacer que tenga ms probabilidades de desarrollar presin arterial elevada: Fumar. No hacer la cantidad suficiente de actividad fsica o ejercicio. Tener sobrepeso. Consumir mucha grasa, azcar, caloras o sal (sodio) en su dieta. Beber alcohol en exceso. Otros factores de riesgo son los siguientes: Tener alguna de estas afecciones: Enfermedad cardaca. Diabetes. Colesterol alto. Enfermedad renal. Apnea obstructiva del sueo. Tener antecedentes familiares de presin arterial elevada y colesterol elevado. Edad. El riesgo aumenta con la edad. Estrs. Cules son los signos o sntomas? Es posible que la presin arterial alta no cause sntomas. La presin arterial muy alta (crisis hipertensiva) puede provocar: Dolor de cabeza. Latidos cardacos acelerados o irregulares (palpitaciones). Falta de aire. Hemorragia nasal. Vomitar o sentir ganas de vomitar (nuseas). Cambios en la forma de ver. Dolor muy intenso en el pecho. Sensacin de mareo. Convulsiones. Cmo se trata? Esta afeccin se trata haciendo cambios saludables en el estilo de vida, por ejemplo: Consumir alimentos saludables. Hacer ms ejercicio. Beber menos alcohol. El mdico puede recetarle medicamentos si los cambios en el estilo de vida no son lo suficientemente  eficaces y si: El nmero de arriba est por encima de 130. El nmero de abajo est por encima de 80. Su presin arterial personal ideal puede variar. Siga estas indicaciones en su casa: Comida y bebida  Si se lo dicen, siga el plan de alimentacin de DASH (Dietary Approaches to Stop Hypertension, Maneras de alimentarse para detener la hipertensin). Para seguir este plan: Llene la mitad del plato de cada comida con frutas y verduras. Llene un cuarto del plato de cada comida con cereales integrales. Los cereales integrales incluyen pasta integral, arroz integral y pan integral. Coma y beba productos lcteos con bajo contenido de grasa, como leche descremada o yogur bajo en grasas. Llene un cuarto del plato de cada comida con protenas bajas en grasa (magras). Las protenas bajas en grasa incluyen pescado, pollo sin piel, huevos, frijoles y tofu. Evite consumir carne grasa, carne curada y procesada, o pollo con piel. Evite consumir alimentos prehechos o procesados. Limite la cantidad de sal en su dieta a menos de 1500 mg por da. No beba alcohol si: El mdico le indica que no lo haga. Est embarazada, puede estar embarazada o est tratando de quedar embarazada. Si bebe alcohol: Limite la cantidad que bebe a lo siguiente: De 0 a 1 medida por da para las mujeres. De 0 a 2 medidas por da para los hombres. Sepa cunta cantidad de alcohol hay en las bebidas que toma. En los [place], una medida equivale a una botella de cerveza de 12 oz (355 ml), un vaso de vino de 5 oz (148 ml) o un vaso de una bebida alcohlica de alta graduacin de 1 oz (44 ml). Estilo de vida  Trabaje con su mdico para mantenerse en un peso saludable o para perder peso. Pregntele a su mdico cul es el peso recomendable para   usted. Realice al menos 30 minutos de ejercicio que haga que se acelere su corazn (ejercicio aerbico) la mayora de los das de la semana. Estos pueden incluir caminar, nadar o andar en  bicicleta. Realice al menos 30 minutos de ejercicio que fortalezca sus msculos (ejercicios de resistencia) al menos 3 das a la semana. Estos pueden incluir levantar pesas o hacer Pilates. No fume ni consuma ningn producto que contenga nicotina o tabaco. Si necesita ayuda para dejar de consumir estos productos, consulte al mdico. Controle su presin arterial en su casa tal como le indic el mdico. Concurra a todas las visitas de seguimiento. Medicamentos Use los medicamentos de venta libre y los recetados solamente como se lo haya indicado el mdico. Siga cuidadosamente las indicaciones. No omita las dosis de medicamentos para la presin arterial. Los medicamentos pierden eficacia si omite dosis. El hecho de omitir las dosis tambin aumenta el riesgo de otros problemas. Pregntele a su mdico a qu efectos secundarios o reacciones a los medicamentos debe prestar atencin. Comunquese con un mdico si: Piensa que tiene una reaccin a los medicamentos que est tomando. Tiene dolores de cabeza frecuentes. Siente mareos. Tiene hinchazn en los tobillos. Tiene problemas de visin. Solicite ayuda de inmediato si: Siente un dolor de cabeza muy intenso. Empieza a sentirse desorientado (confundido). Se siente dbil o adormecido. Siente que va a desmayarse. Tiene un dolor muy intenso en: Pecho. Vientre (abdomen). Vomita ms de una vez. Tiene dificultad para respirar. Estos sntomas pueden indicar una emergencia. Solicite ayuda de inmediato. Llame al 911. No espere a ver si los sntomas desaparecen. No conduzca por sus propios medios hasta el hospital. Resumen El trmino hipertensin es otra forma de denominar a la presin arterial elevada. La presin arterial elevada fuerza al corazn a trabajar ms para bombear la sangre. Para la mayora de las personas, una presin arterial normal es menor que 120/80. Las decisiones saludables pueden ayudarle a disminuir su presin arterial. Si no puede  bajar su presin arterial mediante decisiones saludables, es posible que deba tomar medicamentos. Esta informacin no tiene como fin reemplazar el consejo del mdico. Asegrese de hacerle al mdico cualquier pregunta que tenga. Document Revised: 10/19/2021 Document Reviewed: 10/19/2021 Elsevier Patient Education  2024 [employer]  

## 2023-07-10 NOTE — Assessment & Plan Note (Signed)
Diet and nutrition discussed Continue rosuvastatin 20 mg daily The 10-year ASCVD risk score (Arnett DK, et al., 2019) is: 7.8%   Values used to calculate the score:     Age: 67 years     Sex: Female     Is Non-Hispanic African American: No     Diabetic: No     Tobacco smoker: No     Systolic Blood Pressure: 138 mmHg     Is BP treated: Yes     HDL Cholesterol: 58.4 mg/dL     Total Cholesterol: 172 mg/dL

## 2023-07-10 NOTE — Progress Notes (Signed)
Emily [name] 67 y.o.   Chief Complaint  Patient presents with   Hypertension    Patient states her BP has been elevated     HISTORY OF PRESENT ILLNESS: This is a 67 y.o. female complaining of elevated blood pressure readings when she eats too much sugar. No other complaints or medical concerns today Presently on lisinopril and metoprolol succinate BP Readings from Last 3 Encounters:  07/10/23 138/82  05/21/23 128/82  03/07/23 124/82     Hypertension Pertinent negatives include no chest pain, headaches, palpitations or shortness of breath.     Prior to Admission medications   Medication Sig Start Date End Date Taking? Authorizing Provider  acetic acid-hydrocortisone (VOSOL-HC) OTIC solution Place 3 drops into the left ear 3 (three) times daily. 05/21/23  Yes Cuinn Westerhold, Eilleen Kempf, MD  ALPRAZolam Prudy Feeler) 0.5 MG tablet TAKE 1 TABLET BY MOUTH EVERY DAY AS NEEDED FOR ANXIETY 12/01/22  Yes Ledger Heindl, Eilleen Kempf, MD  Ascorbic Acid (VITAMIN C) 1000 MG tablet Take 1,000 mg by mouth daily.   Yes [provider]  CALCIUM PO Take 1 tablet by mouth daily.    Yes [provider]  Cholecalciferol (VITAMIN D3 PO) Take 125 mg by mouth daily.   Yes [provider]  ciclopirox (PENLAC) 8 % solution Apply topically at bedtime. Apply over nail and surrounding skin. Apply daily over previous coat. After seven (7) days, may remove with alcohol and continue cycle. 11/12/22  Yes McDonald, Rachelle Hora, DPM  Coenzyme Q10 (CO Q-10 PO) Take 18 mg by mouth daily.   Yes [provider]  esomeprazole (NEXIUM) 40 MG capsule Take 40 mg by mouth daily at 12 noon.   Yes [provider]  lisinopril (ZESTRIL) 40 MG tablet TAKE 1 TABLET BY MOUTH EVERY DAY 02/28/23  Yes Orvetta Danielski, Eilleen Kempf, MD  metoprolol succinate (TOPROL-XL) 50 MG 24 hr tablet TAKE 1 TABLET BY MOUTH DAILY. TAKE WITH OR IMMEDIATELY FOLLOWING A MEAL. 10/11/22  Yes Bryannah Boston, Eilleen Kempf, MD  Multiple  Vitamin (MULTI-VITAMIN DAILY PO) Take by mouth daily. TAKE 1 TABLET DAILY   Yes [provider]  NIFEdipine (ADALAT CC) 30 MG 24 hr tablet    Yes [provider]  rosuvastatin (CRESTOR) 20 MG tablet TAKE 1 TABLET BY MOUTH EVERY DAY 07/11/22  Yes Nyliah Nierenberg, Eilleen Kempf, MD  Turmeric (QC TUMERIC COMPLEX PO) Take by mouth daily. NATURAL SUPPLEMENT   Yes [provider]  cyclobenzaprine (FLEXERIL) 5 MG tablet     [provider]  famotidine (PEPCID) 20 MG tablet Take 20 mg by mouth at bedtime. Patient not taking: Reported on 07/10/2023    [provider]    Allergies  Allergen Reactions   Ibuprofen    Aspirin Hives   Atorvastatin     Yellow fingernails   Nsaids Hives    Patient Active Problem List   Diagnosis Date Noted   Acute otalgia, left 05/21/2023   Left otitis media 05/21/2023   Acute pain of left shoulder 03/07/2023   Onychomycosis 10/30/2022   Prediabetes 02/12/2022   Hypertensive urgency 11/28/2021   History of arthritis 08/15/2020   History of gastroesophageal reflux (GERD) 08/15/2020   Lumbar pain 06/30/2019   Carotid artery disease (HCC) 12/19/2018   Bilateral carotid artery stenosis, R- 40-59%, L 1-39% 11/2017 03/25/2018   Dyslipidemia 03/25/2018   GERD (gastroesophageal reflux disease) 04/12/2016   Dyspepsia 11/02/2015   Chronic high back pain 09/24/2015   Essential hypertension 10/03/2012   Hyperlipidemia 10/03/2012  DYSPEPSIA&OTHER SPEC DISORDERS FUNCTION STOMACH 07/20/2009    Past Medical History:  Diagnosis Date   Abdominal pain, chronic, epigastric 11/02/2015   Started amitriptyline at hospitalization 04/13/2016 EGD x 2 negative Also w/ chronic chest wall and back pain Would try not to work up further    Anxiety    Arthritis    BACK,KNEES   Beta thalassemia minor    diagnosed by hematology Dr. Dorann Lodge, also with sickle cell trait   Blood transfusion without reported diagnosis    "LONG TIME AGO"   Chronic  high back pain 09/24/2015   Colon polyps    GERD (gastroesophageal reflux disease)    Heart murmur    "SMALL DOESN'T BOTHER ME"   Hyperlipidemia    Hypertension    Sickle cell trait (HCC)    diagnosed by hematology Dr. Pamelia Hoit, coexsisting with beta-thalassemia minor    Past Surgical History:  Procedure Laterality Date   ABDOMINAL HYSTERECTOMY     1998   COLONOSCOPY     ESOPHAGOGASTRODUODENOSCOPY     ESOPHAGOGASTRODUODENOSCOPY [name] 04/13/2016   Procedure: ESOPHAGOGASTRODUODENOSCOPY (EGD);  Surgeon: Iva Boop, MD;  Location: [facility];  Service: Endoscopy;  Laterality: [name];    Social History   Socioeconomic History   Marital status: Married    Spouse name: Not on file   Number of children: 3   Years of education: Not on file   Highest education level: Not on file  Occupational History   Not on file  Tobacco Use   Smoking status: Never    Passive exposure: Never   Smokeless tobacco: Never  Vaping Use   Vaping status: Never Used  Substance and Sexual Activity   Alcohol use: No    Alcohol/week: 0.0 standard drinks of alcohol   Drug use: No   Sexual activity: Yes    Comment: 1st intercourse- 89, partner-1, married- 42 yrs   Other Topics Concern   Not on file  Social History Narrative   Lives with husband and works at [employer] investment banker Strain: Not on file  Food Insecurity: Not on file  Transportation Needs: Not on file  Physical Activity: Not on file  Stress: Not on file  Social Connections: Not on file  Intimate Partner Violence: Not on file    Family History  Problem Relation Age of Onset   Aneurysm Mother    Diabetes Father    Colon polyps Brother    Colon cancer Brother    Liver cancer Brother    Colon cancer Nephew    Esophageal cancer Neg Hx    Stomach cancer Neg Hx    Pancreatic cancer Neg Hx    Rectal cancer Neg Hx    Breast cancer Neg Hx      Review of Systems  Constitutional: Negative.   Negative for chills and fever.  HENT: Negative.  Negative for congestion and sore throat.   Respiratory: Negative.  Negative for cough and shortness of breath.   Cardiovascular: Negative.  Negative for chest pain and palpitations.  Gastrointestinal:  Negative for abdominal pain, diarrhea, nausea and vomiting.  Genitourinary: Negative.  Negative for dysuria and hematuria.  Skin: Negative.  Negative for rash.  Neurological: Negative.  Negative for dizziness and headaches.  All other systems reviewed and are negative.   Vitals:   07/10/23 1337  BP: 138/82  Pulse: 70  Temp: 98.9 F (37.2 C)  SpO2: 95%    Physical Exam Vitals reviewed.  Constitutional:      Appearance: Normal appearance.  HENT:     Head: Normocephalic.  Eyes:     Extraocular Movements: Extraocular movements intact.     Pupils: Pupils are equal, round, and reactive to light.  Cardiovascular:     Rate and Rhythm: Normal rate and regular rhythm.     Pulses: Normal pulses.     Heart sounds: Normal heart sounds.  Pulmonary:     Effort: Pulmonary effort is normal.     Breath sounds: Normal breath sounds.  Musculoskeletal:     Cervical back: No tenderness.  Lymphadenopathy:     Cervical: No cervical adenopathy.  Skin:    General: Skin is warm and dry.  Neurological:     Mental Status: She is alert and oriented to person, place, and time.  Psychiatric:        Mood and Affect: Mood normal.        Behavior: Behavior normal.      ASSESSMENT & PLAN: A total of 46 minutes was spent with the patient and counseling/coordination of care regarding preparing for this visit, review of most recent office visit notes, review of multiple chronic medical conditions under management, review of most recent blood work results, review of all medications, cardiovascular risks associated with hypertension and dyslipidemia, education on nutrition, prognosis, review of health maintenance items, documentation and need for  follow-up.  Problem List Items Addressed This Visit       Cardiovascular and Mediastinum   Essential hypertension - Primary    Well-controlled hypertension today. Continue lisinopril 40 mg and metoprolol succinate 50 mg daily Also taking nifedipine 30 mg daily Diet and nutrition discussed Advised to the crease amount of daily carbohydrate intake and daily calories and increase amount of plant-based protein in her diet Cardiovascular risk associated with hypertension discussed        Other   Dyslipidemia    Diet and nutrition discussed Continue rosuvastatin 20 mg daily The 10-year ASCVD risk score (Arnett DK, et al., 2019) is: 7.8%   Values used to calculate the score:     Age: 45 years     Sex: Female     Is Non-Hispanic African American: No     Diabetic: No     Tobacco smoker: No     Systolic Blood Pressure: 138 mmHg     Is BP treated: Yes     HDL Cholesterol: 58.4 mg/dL     Total Cholesterol: 172 mg/dL       Prediabetes    Diet and nutrition discussed Cardiovascular risks associated with diabetes discussed Advised to decrease amount of daily carbohydrate intake and daily calories and increase amount of plant-based protein in her diet      Patient Instructions  Hipertensin en los adultos Hypertension, Adult El trmino hipertensin es otra forma de denominar a la presin arterial elevada. La presin arterial elevada fuerza al corazn a trabajar ms para bombear la sangre. Esto puede causar problemas con el paso del [place]. Una lectura de presin arterial est compuesta por 2 nmeros. Hay un nmero superior (sistlico) sobre un nmero inferior (diastlico). Lo ideal es tener la presin arterial por debajo de 120/80. Cules son las causas? Se desconoce la causa de esta afeccin. Algunas otras afecciones pueden provocar presin arterial elevada. Qu incrementa el riesgo? Algunos factores del estilo de vida pueden hacer que tenga ms probabilidades de desarrollar  presin arterial elevada: Fumar. No hacer la cantidad suficiente de actividad fsica o ejercicio. Tener sobrepeso.  Consumir mucha grasa, azcar, caloras o sal (sodio) en su dieta. Beber alcohol en exceso. Otros factores de riesgo son los siguientes: Tener alguna de estas afecciones: Enfermedad cardaca. Diabetes. Colesterol alto. Enfermedad renal. Apnea obstructiva del sueo. Tener antecedentes familiares de presin arterial elevada y colesterol elevado. Edad. El riesgo aumenta con la edad. Estrs. Cules son los signos o sntomas? Es posible que la presin arterial alta no cause sntomas. La presin arterial muy alta (crisis hipertensiva) puede provocar: Dolor de cabeza. Latidos cardacos acelerados o irregulares (palpitaciones). Falta de aire. Hemorragia nasal. Vomitar o sentir ganas de vomitar (nuseas). Cambios en la forma de ver. Dolor muy intenso en el pecho. Sensacin de [employer]. Convulsiones. Cmo se trata? Esta afeccin se trata haciendo cambios saludables en el estilo de vida, por ejemplo: Consumir alimentos saludables. Hacer ms ejercicio. Beber menos alcohol. El mdico puede recetarle medicamentos si los cambios en el estilo de vida no son lo suficientemente eficaces y si: El nmero de arriba est por encima de 130. El nmero de abajo est por encima de 80. Su presin arterial personal ideal puede variar. Siga estas indicaciones en su casa: Comida y bebida  Si se lo dicen, siga el plan de alimentacin de DASH (Dietary Approaches to Stop Hypertension, Maneras de alimentarse para detener la hipertensin). Para seguir este plan: Llene la mitad del plato de cada comida con frutas y verduras. Llene un cuarto del plato de cada comida con cereales integrales. Los cereales integrales incluyen pasta integral, arroz integral y pan integral. Coma y beba productos lcteos con bajo contenido de grasa, como leche descremada o yogur bajo en grasas. Llene un cuarto del plato  de cada comida con protenas bajas en grasa (magras). Las protenas bajas en grasa incluyen pescado, pollo sin piel, huevos, frijoles y tofu. Evite consumir carne grasa, carne curada y procesada, o pollo con piel. Evite consumir alimentos prehechos o procesados. Limite la cantidad de sal en su dieta a menos de 1500 mg por da. No beba alcohol si: El mdico le indica que no lo haga. Est embarazada, puede estar embarazada o est tratando de [place]. Si bebe alcohol: Limite la cantidad que bebe a lo siguiente: De 0 a 1 medida por da para las mujeres. De 0 a 2 medidas por da para los hombres. Sepa cunta cantidad de alcohol hay en las bebidas que toma. En los [address], una medida equivale a una botella de cerveza de 12 oz (355 ml), un vaso de vino de 5 oz (148 ml) o un vaso de una bebida alcohlica de alta graduacin de 1 oz (44 ml). Estilo de vida  Trabaje con su mdico para mantenerse en un peso saludable o para perder peso. Pregntele a su mdico cul es el peso recomendable para usted. Realice al menos 30 minutos de ejercicio que haga que se acelere su corazn (ejercicio Magazine features editor) la [employer] de la [place]. Estos pueden incluir caminar, nadar o andar en bicicleta. Realice al menos 30 minutos de ejercicio que fortalezca sus msculos (ejercicios de resistencia) al menos 3 das a la Onset. Estos pueden incluir levantar pesas o hacer Pilates. No fume ni consuma ningn producto que contenga nicotina o tabaco. Si necesita ayuda para dejar de consumir estos productos, consulte al mdico. Controle su presin arterial en su casa tal como le indic el mdico. Concurra a todas las visitas de seguimiento. Medicamentos Use los medicamentos de venta libre y los recetados solamente como se lo haya indicado el mdico. Siga cuidadosamente  las indicaciones. No omita las dosis de medicamentos para la presin arterial. Los medicamentos pierden eficacia si omite dosis. El hecho de  omitir las dosis tambin [place] el riesgo de otros problemas. Pregntele a su mdico a qu efectos secundarios o reacciones a los Museum/gallery curator. Comunquese con un mdico si: Piensa que tiene [place] reaccin a los medicamentos que est tomando. Tiene dolores de cabeza frecuentes. Siente mareos. Tiene hinchazn en los tobillos. Tiene problemas de visin. Solicite ayuda de inmediato si: Siente un dolor de cabeza muy intenso. Empieza a sentirse desorientado (confundido). Se siente dbil o adormecido. Siente que va a desmayarse. Tiene un dolor muy intenso en: Pecho. Vientre (abdomen). Vomita ms de una vez. Tiene dificultad para respirar. Estos sntomas pueden Customer service manager. Solicite ayuda de inmediato. Llame al 911. No espere a ver si los sntomas desaparecen. No conduzca por sus propios medios [employer]. Resumen El trmino hipertensin es otra forma de denominar a la presin arterial elevada. La presin arterial elevada fuerza al corazn a trabajar ms para bombear la sangre. Para la [employer], una presin arterial normal es menor que 120/80. Las decisiones saludables pueden ayudarle a disminuir su presin arterial. Si no puede bajar su presin arterial mediante decisiones saludables, es posible que deba tomar medicamentos. Esta informacin no tiene Theme park manager el consejo del mdico. Asegrese de hacerle al mdico cualquier pregunta que tenga. Document Revised: 10/19/2021 Document Reviewed: 10/19/2021 Elsevier Patient Education  2024 Elsevier Inc.     Edwina Barth, MD [facility]

## 2023-07-10 NOTE — Assessment & Plan Note (Signed)
Well-controlled hypertension today. Continue lisinopril 40 mg and metoprolol succinate 50 mg daily Also taking nifedipine 30 mg daily Diet and nutrition discussed Advised to the crease amount of daily carbohydrate intake and daily calories and increase amount of plant-based protein in her diet Cardiovascular risk associated with hypertension discussed

## 2023-07-11 ENCOUNTER — Ambulatory Visit: Payer: Managed Care, Other (non HMO) | Admitting: Sports Medicine

## 2023-07-25 ENCOUNTER — Encounter: Payer: Self-pay | Admitting: Sports Medicine

## 2023-07-25 ENCOUNTER — Ambulatory Visit: Payer: Managed Care, Other (non HMO) | Admitting: Sports Medicine

## 2023-07-25 ENCOUNTER — Other Ambulatory Visit (INDEPENDENT_AMBULATORY_CARE_PROVIDER_SITE_OTHER): Payer: Managed Care, Other (non HMO)

## 2023-07-25 DIAGNOSIS — M25572 Pain in left ankle and joints of left foot: Secondary | ICD-10-CM

## 2023-07-25 DIAGNOSIS — M2142 Flat foot [pes planus] (acquired), left foot: Secondary | ICD-10-CM | POA: Diagnosis not present

## 2023-07-25 DIAGNOSIS — M25512 Pain in left shoulder: Secondary | ICD-10-CM

## 2023-07-25 DIAGNOSIS — M25511 Pain in right shoulder: Secondary | ICD-10-CM

## 2023-07-25 DIAGNOSIS — M2141 Flat foot [pes planus] (acquired), right foot: Secondary | ICD-10-CM | POA: Diagnosis not present

## 2023-07-25 DIAGNOSIS — M722 Plantar fascial fibromatosis: Secondary | ICD-10-CM

## 2023-07-25 DIAGNOSIS — G8929 Other chronic pain: Secondary | ICD-10-CM

## 2023-07-25 MED ORDER — CELECOXIB 100 MG PO CAPS: 100.0000 mg | ORAL_CAPSULE | Freq: Two times a day (BID) | ORAL | 0 refills | Status: AC

## 2023-07-25 NOTE — Progress Notes (Signed)
Bil foot pain.  L foot is worse than R foot.  She has had pain going on one month now.  She is using voltaren gel for the pain it is helping a little.  Walking is the most painful.

## 2023-07-25 NOTE — Progress Notes (Signed)
Office Visit Note   Patient: Emily [name]           Date of Birth: [birth year]           MRN: [id number] Visit Date: 07/25/2023              Requested by: Georgina Quint, MD [address] [place],  [place] [address] PCP: Georgina Quint, MD   Assessment & Plan: Visit Diagnoses:  1. Plantar fasciitis of left foot   2. Pes planus of both feet   3. Chronic pain of both shoulders     Plan: Given improvement in patient's shoulder pain, patient would likely benefit further from enrolling in formal physical therapy.  Patient was referred to physical therapy and will have follow-up in 6 weeks to reevaluate shoulder in the clinic.  His x-rays were reviewed and physical exam is suggestive of plantar fasciitis of the left foot. Patient would also benefit from formal physical therapy for the plantar fasciitis.  Patient was advised to continue wearing the orthotics as she has been.  Will also recommend anti-inflammatory medication at this time while patient is enrolling in physical therapy to decrease the inflammation and allow her to benefit fully from PT.  Will do Celebrex 2 times daily for the next month.  Patient to follow-up in approximately 6 weeks.  If there is no improvement in patient's plantar fasciitis at that time can consider injection versus shockwave therapy.  Follow-Up Instructions: No follow-ups on file.   Orders:  Orders Placed This Encounter  Procedures   XR Ankle Complete Left   Ambulatory referral to Physical Therapy   Meds ordered this encounter  Medications   celecoxib (CELEBREX) 100 MG capsule    Sig: Take 1 capsule (100 mg total) by mouth 2 (two) times daily.    Dispense:  60 capsule    Refill:  0    Allergy she reports was GERD for ibuprofen      Procedures: No procedures performed   Clinical Data: No additional findings.   Subjective: Chief Complaint  Patient presents with   Left Foot - Pain   Right Foot - Pain    Patient  is presenting today from follow-up from previous appointment.  Patient was seen approximately month and a half ago.  Patient had bilateral shoulder injections at that time.  Patient states that she feels like her shoulders are getting a little bit better and almost 60% better but she still notes some pain whenever she is doing anything overhead.  Patient also notes some pain when the weather is cold.  Patient has not been doing any exercises or physical therapy at home.  Patient also notes that her left foot has been bothering her to the point where she can barely stand.  Patient states that she stands on it for long periods of time.  Patient was put in orthotics during previous visit and states that those helped slightly but the pain is still persistent.  Patient states that the pain is located near the heel and travels to the midfoot on the plantar side.  Patient is standing on her feet all day while she is at work.  Patient denies any numbness tingling or other toes.  Patient denies any trauma to the area.  Patient has no other concerns at this time.    Review of Systems   Objective: Vital Signs: There were no vitals taken for this visit.  Physical Exam   Ankle/Foot, left: TTP  noted at the calcaneal insertion. No visible erythema, swelling, ecchymosis, or bony deformity. Notable pes planus and inversion. Transverse arch grossly intact; Normal eversion/inversion stress testing. Range of motion is full in all directions. Strength is 5/5 in all directions. No tenderness at the insertion/body/myotendinous junction of the Achilles tendon; No peroneal tendon tenderness or subluxation; No tenderness on posterior aspects of lateral and medial malleolus; Stable lateral and medial ligaments; Talar dome nontender; There is plantar calcaneal tenderness; No tenderness over the navicular prominence or base of the 5th MT; No tenderness over cuboid; No tenderness at the distal metatarsals; Able to walk 4 steps  with difficulty.   Provocative Testing:   - Anterior Drawer: NEG  - Talar Tilt: NEG  - Tib/Fib Squeeze Test: NEG; Calcaneal Squeeze Test: NEG    Ortho Exam  Specialty Comments:  No specialty comments available.  Imaging: XR Ankle Complete Left  Result Date: 07/25/2023 No acute fracture or displacement of the distal tibia or fibula.  Talus shows no acute fracture or dislocation.  Normal alignment of the talus with tibia and fibula.  Calcaneal spur noted on the calcaneus on the inferior aspect of the heel bone.  There is some mild prominence/bony changes of the posterior superior aspect of the calcaneus, can be consistent with early Haglund's deformity.    PMFS History: Patient Active Problem List   Diagnosis Date Noted   Onychomycosis 10/30/2022   Prediabetes 02/12/2022   Hypertensive urgency 11/28/2021   History of arthritis 08/15/2020   History of gastroesophageal reflux (GERD) 08/15/2020   Carotid artery disease (HCC) 12/19/2018   Bilateral carotid artery stenosis, R- 40-59%, L 1-39% 11/2017 03/25/2018   Dyslipidemia 03/25/2018   GERD (gastroesophageal reflux disease) 04/12/2016   Chronic high back pain 09/24/2015   Essential hypertension 10/03/2012   Hyperlipidemia 10/03/2012   DYSPEPSIA&OTHER SPEC DISORDERS FUNCTION STOMACH 07/20/2009   Past Medical History:  Diagnosis Date   Abdominal pain, chronic, epigastric 11/02/2015   Started amitriptyline at hospitalization 04/13/2016 EGD x 2 negative Also w/ chronic chest wall and back pain Would try not to work up further    Anxiety    Arthritis    BACK,KNEES   Beta thalassemia minor    diagnosed by hematology Dr. Dorann Lodge, also with sickle cell trait   Blood transfusion without reported diagnosis    "LONG TIME AGO"   Chronic high back pain 09/24/2015   Colon polyps    GERD (gastroesophageal reflux disease)    Heart murmur    "SMALL DOESN'T BOTHER ME"   Hyperlipidemia    Hypertension    Sickle cell trait (HCC)     diagnosed by hematology Dr. Pamelia Hoit, coexsisting with beta-thalassemia minor    Family History  Problem Relation Age of Onset   Aneurysm Mother    Diabetes Father    Colon polyps Brother    Colon cancer Brother    Liver cancer Brother    Colon cancer Nephew    Esophageal cancer Neg Hx    Stomach cancer Neg Hx    Pancreatic cancer Neg Hx    Rectal cancer Neg Hx    Breast cancer Neg Hx     Past Surgical History:  Procedure Laterality Date   ABDOMINAL HYSTERECTOMY     1998   COLONOSCOPY     ESOPHAGOGASTRODUODENOSCOPY     ESOPHAGOGASTRODUODENOSCOPY [name] 04/13/2016   Procedure: ESOPHAGOGASTRODUODENOSCOPY (EGD);  Surgeon: Iva Boop, MD;  Location: [facility];  Service: Endoscopy;  Laterality: [name];   Social History  Occupational History   Not on file  Tobacco Use   Smoking status: Never    Passive exposure: Never   Smokeless tobacco: Never  Vaping Use   Vaping status: Never Used  Substance and Sexual Activity   Alcohol use: No    Alcohol/week: 0.0 standard drinks of alcohol   Drug use: No   Sexual activity: Yes    Comment: 1st intercourse- 62, partner-1, married- 42 yrs

## 2023-08-25 ENCOUNTER — Other Ambulatory Visit: Payer: Self-pay | Admitting: Emergency Medicine

## 2023-08-25 DIAGNOSIS — E785 Hyperlipidemia, unspecified: Secondary | ICD-10-CM

## 2023-08-26 ENCOUNTER — Other Ambulatory Visit: Payer: Self-pay | Admitting: Emergency Medicine

## 2023-08-26 DIAGNOSIS — F418 Other specified anxiety disorders: Secondary | ICD-10-CM

## 2023-08-28 ENCOUNTER — Ambulatory Visit: Payer: Managed Care, Other (non HMO) | Admitting: Family Medicine

## 2023-08-28 ENCOUNTER — Other Ambulatory Visit: Payer: Self-pay | Admitting: Emergency Medicine

## 2023-08-28 DIAGNOSIS — I10 Essential (primary) hypertension: Secondary | ICD-10-CM

## 2023-09-09 ENCOUNTER — Telehealth: Payer: Self-pay | Admitting: Sports Medicine

## 2023-09-09 ENCOUNTER — Ambulatory Visit: Payer: Managed Care, Other (non HMO) | Admitting: Sports Medicine

## 2023-09-09 NOTE — Telephone Encounter (Signed)
Patient called asked if she can get a Rx for Prednisone sent to her pharmacy. Patient uses [facility] on [address]. The number to contact patient is [phone]

## 2023-09-10 ENCOUNTER — Other Ambulatory Visit: Payer: Self-pay | Admitting: Sports Medicine

## 2023-09-10 ENCOUNTER — Ambulatory Visit: Payer: Managed Care, Other (non HMO) | Admitting: Sports Medicine

## 2023-09-13 ENCOUNTER — Other Ambulatory Visit: Payer: Self-pay | Admitting: Orthopaedic Surgery

## 2023-09-13 ENCOUNTER — Telehealth: Payer: Self-pay | Admitting: Sports Medicine

## 2023-09-13 MED ORDER — PREDNISONE 5 MG PO TABS: 5.0000 mg | ORAL_TABLET | Freq: Two times a day (BID) | ORAL | 0 refills | Status: DC

## 2023-09-13 NOTE — Telephone Encounter (Signed)
Spoke with patient's daughter about her Prednisone prescription which was sent in today with approval by Dr. Ophelia Charter. Advised regarding the risk of worsening diabetes as she is pre-diabetic and risk of damage to the hip joint.

## 2023-09-13 NOTE — Telephone Encounter (Signed)
error 

## 2023-09-23 ENCOUNTER — Ambulatory Visit: Payer: Managed Care, Other (non HMO) | Admitting: Sports Medicine

## 2023-09-24 ENCOUNTER — Ambulatory Visit: Payer: Managed Care, Other (non HMO) | Admitting: Emergency Medicine

## 2023-10-03 ENCOUNTER — Encounter: Payer: Self-pay | Admitting: Emergency Medicine

## 2023-10-03 ENCOUNTER — Ambulatory Visit: Payer: Managed Care, Other (non HMO) | Admitting: Emergency Medicine

## 2023-10-03 VITALS — BP 130/72 | HR 70 | Temp 99.2°F | Ht 66.0 in | Wt 191.4 lb

## 2023-10-03 DIAGNOSIS — R7303 Prediabetes: Secondary | ICD-10-CM | POA: Diagnosis not present

## 2023-10-03 DIAGNOSIS — K219 Gastro-esophageal reflux disease without esophagitis: Secondary | ICD-10-CM

## 2023-10-03 DIAGNOSIS — E785 Hyperlipidemia, unspecified: Secondary | ICD-10-CM | POA: Diagnosis not present

## 2023-10-03 DIAGNOSIS — I10 Essential (primary) hypertension: Secondary | ICD-10-CM | POA: Diagnosis not present

## 2023-10-03 LAB — COMPREHENSIVE METABOLIC PANEL
ALT: 19 U/L (ref 0–35)
AST: 17 U/L (ref 0–37)
Albumin: 4 g/dL (ref 3.5–5.2)
Alkaline Phosphatase: 102 U/L (ref 39–117)
BUN: 14 mg/dL (ref 6–23)
CO2: 30 meq/L (ref 19–32)
Calcium: 9.8 mg/dL (ref 8.4–10.5)
Chloride: 105 meq/L (ref 96–112)
Creatinine, Ser: 0.66 mg/dL (ref 0.40–1.20)
GFR: 91.64 mL/min (ref >60.00)
Glucose, Bld: 88 mg/dL (ref 70–99)
Potassium: 3.9 meq/L (ref 3.5–5.1)
Sodium: 140 meq/L (ref 135–145)
Total Bilirubin: 0.5 mg/dL (ref 0.2–1.2)
Total Protein: 7 g/dL (ref 6.0–8.3)

## 2023-10-03 LAB — CBC WITH DIFFERENTIAL/PLATELET
Basophils Absolute: 0.1 10*3/uL (ref 0.0–0.1)
Basophils Relative: 1.1 % (ref 0.0–3.0)
Eosinophils Absolute: 0.3 10*3/uL (ref 0.0–0.7)
Eosinophils Relative: 4.6 % (ref 0.0–5.0)
HCT: 37.9 % (ref 36.0–46.0)
Hemoglobin: 12 g/dL (ref 12.0–15.0)
Lymphocytes Relative: 36 % (ref 12.0–46.0)
Lymphs Abs: 2.2 10*3/uL (ref 0.7–4.0)
MCHC: 31.8 g/dL (ref 30.0–36.0)
MCV: 78.3 fL (ref 78.0–100.0)
Monocytes Absolute: 0.4 10*3/uL (ref 0.1–1.0)
Monocytes Relative: 7.3 % (ref 3.0–12.0)
Neutro Abs: 3.1 10*3/uL (ref 1.4–7.7)
Neutrophils Relative %: 51 % (ref 43.0–77.0)
Platelets: 267 10*3/uL (ref 150.0–400.0)
RBC: 4.84 Mil/uL (ref 3.87–5.11)
RDW: 14.5 % (ref 11.5–15.5)
WBC: 6.1 10*3/uL (ref 4.0–10.5)

## 2023-10-03 LAB — LIPID PANEL
Cholesterol: 199 mg/dL (ref 0–200)
HDL: 52.7 mg/dL (ref >39.00)
LDL Cholesterol: 126 mg/dL — ABNORMAL HIGH (ref 0–99)
NonHDL: 145.9
Total CHOL/HDL Ratio: 4
Triglycerides: 99 mg/dL (ref 0.0–149.0)
VLDL: 19.8 mg/dL (ref 0.0–40.0)

## 2023-10-03 LAB — HEMOGLOBIN A1C: Hgb A1c MFr Bld: 5.7 % (ref 4.6–6.5)

## 2023-10-03 MED ORDER — METOPROLOL SUCCINATE ER 25 MG PO TB24
25.0000 mg | ORAL_TABLET | Freq: Every day | ORAL | 3 refills | Status: DC
Start: 2023-10-03 — End: 2024-08-18

## 2023-10-03 NOTE — Progress Notes (Signed)
Emily [name] 67 y.o.   Chief Complaint  Patient presents with   Medical Management of Chronic Issues    f/u appt,     HISTORY OF PRESENT ILLNESS: This is a 67 y.o. female A1A here for 32-month follow-up on chronic medical conditions including hypertension States for the past month she has noticed low blood pressure readings at home.  Symptomatic. Decreased amount of metoprolol succinate to half a tablet with improvement Also complaining of itchy skin when consuming greasy foods. No other complaints or medical concerns today. BP Readings from Last 3 Encounters:  07/10/23 138/82  05/21/23 128/82  03/07/23 124/82     HPI   Prior to Admission medications   Medication Sig Start Date End Date Taking? Authorizing Provider  acetic acid-hydrocortisone (VOSOL-HC) OTIC solution Place 3 drops into the left ear 3 (three) times daily. 05/21/23  Yes Bobie Kistler, Eilleen Kempf, MD  ALPRAZolam Prudy Feeler) 0.5 MG tablet TAKE 1 TABLET BY MOUTH EVERY DAY AS NEEDED FOR ANXIETY 08/27/23  Yes Laporcha Marchesi, Eilleen Kempf, MD  Ascorbic Acid (VITAMIN C) 1000 MG tablet Take 1,000 mg by mouth daily.   Yes [provider]  CALCIUM PO Take 1 tablet by mouth daily.    Yes [provider]  Cholecalciferol (VITAMIN D3 PO) Take 125 mg by mouth daily.   Yes [provider]  ciclopirox (PENLAC) 8 % solution Apply topically at bedtime. Apply over nail and surrounding skin. Apply daily over previous coat. After seven (7) days, may remove with alcohol and continue cycle. 11/12/22  Yes McDonald, Rachelle Hora, DPM  Coenzyme Q10 (CO Q-10 PO) Take 18 mg by mouth daily.   Yes [provider]  cyclobenzaprine (FLEXERIL) 5 MG tablet    Yes [provider]  esomeprazole (NEXIUM) 40 MG capsule Take 40 mg by mouth daily at 12 noon.   Yes [provider]  lisinopril (ZESTRIL) 40 MG tablet TAKE 1 TABLET BY MOUTH EVERY DAY 02/28/23  Yes Lola Lofaro, Eilleen Kempf, MD  metoprolol succinate  (TOPROL-XL) 50 MG 24 hr tablet TAKE 1 TABLET BY MOUTH EVERY DAY WITH OR IMMEDIATELY FOLLOWING A MEAL 08/28/23  Yes Kani Jobson, Eilleen Kempf, MD  Multiple Vitamin (MULTI-VITAMIN DAILY PO) Take by mouth daily. TAKE 1 TABLET DAILY   Yes [provider]  NIFEdipine (ADALAT CC) 30 MG 24 hr tablet    Yes [provider]  predniSONE (DELTASONE) 5 MG tablet Take 1 tablet (5 mg total) by mouth 2 (two) times daily with a meal. 09/13/23  Yes Eldred Manges, MD  rosuvastatin (CRESTOR) 20 MG tablet TAKE 1 TABLET BY MOUTH EVERY DAY 08/25/23  Yes Yariana Hoaglund, Eilleen Kempf, MD  Turmeric (QC TUMERIC COMPLEX PO) Take by mouth daily. NATURAL SUPPLEMENT   Yes [provider]  famotidine (PEPCID) 20 MG tablet Take 20 mg by mouth at bedtime. Patient not taking: Reported on 07/10/2023    [provider]    Allergies  Allergen Reactions   Ibuprofen    Aspirin Hives   Atorvastatin     Yellow fingernails   Nsaids Hives    Patient Active Problem List   Diagnosis Date Noted   Onychomycosis 10/30/2022   Prediabetes 02/12/2022   Hypertensive urgency 11/28/2021   History of arthritis 08/15/2020   History of gastroesophageal reflux (GERD) 08/15/2020   Carotid artery disease (HCC) 12/19/2018   Bilateral carotid artery stenosis, R- 40-59%, L 1-39% 11/2017 03/25/2018   Dyslipidemia 03/25/2018   GERD (gastroesophageal reflux disease) 04/12/2016   Chronic  high back pain 09/24/2015   Essential hypertension 10/03/2012   Hyperlipidemia 10/03/2012   DYSPEPSIA&OTHER SPEC DISORDERS FUNCTION STOMACH 07/20/2009    Past Medical History:  Diagnosis Date   Abdominal pain, chronic, epigastric 11/02/2015   Started amitriptyline at hospitalization 04/13/2016 EGD x 2 negative Also w/ chronic chest wall and back pain Would try not to work up further    Anxiety    Arthritis    BACK,KNEES   Beta thalassemia minor    diagnosed by hematology Dr. Dorann Lodge, also with sickle cell trait   Blood transfusion  without reported diagnosis    "LONG TIME AGO"   Chronic high back pain 09/24/2015   Colon polyps    GERD (gastroesophageal reflux disease)    Heart murmur    "SMALL DOESN'T BOTHER ME"   Hyperlipidemia    Hypertension    Sickle cell trait (HCC)    diagnosed by hematology Dr. Pamelia Hoit, coexsisting with beta-thalassemia minor    Past Surgical History:  Procedure Laterality Date   ABDOMINAL HYSTERECTOMY     1998   COLONOSCOPY     ESOPHAGOGASTRODUODENOSCOPY     ESOPHAGOGASTRODUODENOSCOPY [name] 04/13/2016   Procedure: ESOPHAGOGASTRODUODENOSCOPY (EGD);  Surgeon: Iva Boop, MD;  Location: [facility];  Service: Endoscopy;  Laterality: [name];    Social History   Socioeconomic History   Marital status: Married    Spouse name: Not on file   Number of children: 3   Years of education: Not on file   Highest education level: Not on file  Occupational History   Not on file  Tobacco Use   Smoking status: Never    Passive exposure: Never   Smokeless tobacco: Never  Vaping Use   Vaping status: Never Used  Substance and Sexual Activity   Alcohol use: No    Alcohol/week: 0.0 standard drinks of alcohol   Drug use: No   Sexual activity: Yes    Comment: 1st intercourse- 64, partner-1, married- 42 yrs   Other Topics Concern   Not on file  Social History Narrative   Lives with husband and works at [employer] of Corporate investment banker Strain: Not on file  Food Insecurity: Not on file  Transportation Needs: Not on file  Physical Activity: Not on file  Stress: Not on file  Social Connections: Not on file  Intimate Partner Violence: Not on file    Family History  Problem Relation Age of Onset   Aneurysm Mother    Diabetes Father    Colon polyps Brother    Colon cancer Brother    Liver cancer Brother    Colon cancer Nephew    Esophageal cancer Neg Hx    Stomach cancer Neg Hx    Pancreatic cancer Neg Hx    Rectal cancer Neg Hx    Breast cancer Neg  Hx      Review of Systems  Constitutional: Negative.   HENT: Negative.  Negative for congestion and sore throat.   Respiratory: Negative.  Negative for cough and shortness of breath.   Cardiovascular: Negative.  Negative for chest pain and palpitations.  Gastrointestinal:  Negative for abdominal pain, diarrhea, nausea and vomiting.  Genitourinary: Negative.  Negative for dysuria.  Musculoskeletal: Negative.   Skin: Negative.  Negative for rash.  Neurological: Negative.  Negative for dizziness and headaches.  All other systems reviewed and are negative.   Today's Vitals   10/03/23 1312  BP: 130/72  Pulse: 70  Temp: 99.2  F (37.3 C)  TempSrc: Oral  SpO2: 93%  Weight: 191 lb 6 oz (86.8 kg)  Height: 5\' 6"  (1.676 m)   Body mass index is 30.89 kg/m.   Physical Exam Vitals reviewed.  Constitutional:      Appearance: Normal appearance.  HENT:     Head: Normocephalic.     Mouth/Throat:     Mouth: Mucous membranes are moist.     Pharynx: Oropharynx is clear.  Eyes:     Extraocular Movements: Extraocular movements intact.     Pupils: Pupils are equal, round, and reactive to light.  Cardiovascular:     Rate and Rhythm: Normal rate and regular rhythm.     Pulses: Normal pulses.     Heart sounds: Normal heart sounds.  Pulmonary:     Effort: Pulmonary effort is normal.     Breath sounds: Normal breath sounds.  Abdominal:     Palpations: Abdomen is soft.     Tenderness: There is no abdominal tenderness.  Musculoskeletal:     Cervical back: No tenderness.  Lymphadenopathy:     Cervical: No cervical adenopathy.  Skin:    General: Skin is warm and dry.     Capillary Refill: Capillary refill takes less than 2 seconds.  Neurological:     General: No focal deficit present.     Mental Status: She is alert and oriented to person, place, and time.  Psychiatric:        Mood and Affect: Mood normal.        Behavior: Behavior normal.   Results for orders placed or performed in  visit on 10/03/23 (from the past 24 hour(s))  CBC with Differential/Platelet     Status: None   Collection Time: 10/03/23  1:42 PM  Result Value Ref Range   WBC 6.1 4.0 - 10.5 K/uL   RBC 4.84 3.87 - 5.11 Mil/uL   Hemoglobin 12.0 12.0 - 15.0 g/dL   HCT 16.1 09.6 - 04.5 %   MCV 78.3 78.0 - 100.0 fl   MCHC 31.8 30.0 - 36.0 g/dL   RDW 40.9 81.1 - 91.4 %   Platelets 267.0 150.0 - 400.0 K/uL   Neutrophils Relative % 51.0 43.0 - 77.0 %   Lymphocytes Relative 36.0 12.0 - 46.0 %   Monocytes Relative 7.3 3.0 - 12.0 %   Eosinophils Relative 4.6 0.0 - 5.0 %   Basophils Relative 1.1 0.0 - 3.0 %   Neutro Abs 3.1 1.4 - 7.7 K/uL   Lymphs Abs 2.2 0.7 - 4.0 K/uL   Monocytes Absolute 0.4 0.1 - 1.0 K/uL   Eosinophils Absolute 0.3 0.0 - 0.7 K/uL   Basophils Absolute 0.1 0.0 - 0.1 K/uL  Comprehensive metabolic panel     Status: None   Collection Time: 10/03/23  1:42 PM  Result Value Ref Range   Sodium 140 135 - 145 mEq/L   Potassium 3.9 3.5 - 5.1 mEq/L   Chloride 105 96 - 112 mEq/L   CO2 30 19 - 32 mEq/L   Glucose, Bld 88 70 - 99 mg/dL   BUN 14 6 - 23 mg/dL   Creatinine, Ser 7.82 0.40 - 1.20 mg/dL   Total Bilirubin 0.5 0.2 - 1.2 mg/dL   Alkaline Phosphatase 102 39 - 117 U/L   AST 17 0 - 37 U/L   ALT 19 0 - 35 U/L   Total Protein 7.0 6.0 - 8.3 g/dL   Albumin 4.0 3.5 - 5.2 g/dL   GFR 95.62 >13.08 mL/min  Calcium 9.8 8.4 - 10.5 mg/dL  Hemoglobin Z6X     Status: None   Collection Time: 10/03/23  1:42 PM  Result Value Ref Range   Hgb A1c MFr Bld 5.7 4.6 - 6.5 %  Lipid panel     Status: Abnormal   Collection Time: 10/03/23  1:42 PM  Result Value Ref Range   Cholesterol 199 0 - 200 mg/dL   Triglycerides 09.6 0.0 - 149.0 mg/dL   HDL 04.54 >09.81 mg/dL   VLDL 19.1 0.0 - 47.8 mg/dL   LDL Cholesterol 295 (H) 0 - 99 mg/dL   Total CHOL/HDL Ratio 4    NonHDL 145.90      ASSESSMENT & PLAN: A total of 45 minutes was spent with the patient and counseling/coordination of care regarding preparing  for this visit, review of most recent office visit notes, review of multiple chronic medical conditions under management, review of most recent blood work results including interpretation of today's hemoglobin A1c, review of all medications and changes made, cardiovascular risks associated with hypertension, review of health maintenance items, education and nutrition, prognosis, documentation and need for follow-up  Problem List Items Addressed This Visit       Cardiovascular and Mediastinum   Essential hypertension - Primary    Well-controlled hypertension However due to low blood pressure episodes at home recommend to lower dose of metoprolol succinate to 25 mg daily and continue lisinopril 40 mg daily Cardiovascular risks associated with hypertension discussed. Dietary approaches to stop hypertension discussed. 5 follow-up in 6 months.      Relevant Medications   metoprolol succinate (TOPROL-XL) 25 MG 24 hr tablet   Other Relevant Orders   CBC with Differential/Platelet   Comprehensive metabolic panel   Hemoglobin A1c   Lipid panel     Digestive   GERD (gastroesophageal reflux disease)    Well-controlled on Nexium 40 mg daily        Other   Dyslipidemia    Chronic stable condition Diet and nutrition discussed Has developed allergies to greasy foods Continue rosuvastatin 20 mg daily The 10-year ASCVD risk score (Arnett DK, et al., 2019) is: 7.8%   Values used to calculate the score:     Age: 65 years     Sex: Female     Is Non-Hispanic African American: No     Diabetic: No     Tobacco smoker: No     Systolic Blood Pressure: 130 mmHg     Is BP treated: Yes     HDL Cholesterol: 58.4 mg/dL     Total Cholesterol: 172 mg/dL       Relevant Orders   CBC with Differential/Platelet   Comprehensive metabolic panel   Hemoglobin A1c   Lipid panel   Prediabetes    Diet and nutrition discussed Benefits of exercise discussed Blood work including hemoglobin A1c done today.       Relevant Orders   Hemoglobin A1c   Patient Instructions  Mantenimiento de la salud despus de los 65 aos de edad Health Maintenance After Age 22 Despus de los 65 aos de edad, corre un riesgo mayor de Film/video editor enfermedades e infecciones a Air cabin crew, como tambin de sufrir lesiones por cadas. Las cadas son la causa principal de las fracturas de huesos y lesiones en la cabeza de personas mayores de 65 aos de edad. Recibir cuidados preventivos de forma regular puede ayudarlo a mantenerse saludable y en buen [place]. Los cuidados preventivos incluyen realizarse anlisis de forma regular y  realizar Architectural technologist de vida segn las recomendaciones del mdico. Converse con el mdico sobre lo siguiente: Las pruebas de deteccin y los anlisis que debe International aid/development worker. Una prueba de deteccin es un estudio que se para Engineer, manufacturing la presencia de una enfermedad cuando no tiene sntomas. Un plan de dieta y ejercicios adecuado para usted. Qu debo saber sobre las pruebas de deteccin y los anlisis para prevenir cadas? Realizarse pruebas de deteccin y [employer] es la mejor manera de Engineer, manufacturing un problema de salud de forma temprana. El diagnstico y tratamiento tempranos le brindan la mejor oportunidad de Chief Operating Officer las afecciones mdicas que son comunes despus de los 65 aos de edad. Ciertas afecciones y elecciones de estilo de vida pueden hacer que sea ms propenso a sufrir Engineer, site. El mdico puede recomendarle lo siguiente: Controles regulares de la visin. Una visin deficiente y afecciones como las cataratas pueden hacer que sea ms propenso a sufrir Engineer, site. Si [place] lentes, asegrese de obtener una receta actualizada si su visin cambia. Revisin de medicamentos. Revise regularmente con el mdico todos los medicamentos que toma, incluidos los medicamentos de [place]. Consulte al [employer] efectos secundarios que pueden hacer que sea ms propenso a sufrir Engineer, site. Informe al  mdico si alguno de los medicamentos que toma lo hace sentir mareado o somnoliento. Controles de fuerza y equilibrio. El mdico puede recomendar ciertos estudios para controlar su fuerza y equilibrio al estar de pie, al caminar o al cambiar de posicin. Examen de los pies. El dolor y Materials engineer en los pies, como tambin no utilizar el calzado [place], pueden hacer que sea ms propenso a sufrir Engineer, site. Pruebas de deteccin, que incluyen las siguientes: Pruebas de deteccin para la osteoporosis. La osteoporosis es una afeccin que hace que los huesos se tornen ms dbiles y se quiebren con ms facilidad. Pruebas de deteccin para la presin arterial. Los cambios en la presin arterial y los medicamentos para Chief Operating Officer la presin arterial pueden hacerlo sentir mareado. Prueba de deteccin de la depresin. Es ms probable que sufra una cada si tiene miedo a caerse, se siente deprimido o se siente incapaz de Probation officer. Prueba de deteccin de consumo de alcohol. Beber demasiado alcohol puede afectar su equilibrio y puede hacer que sea ms propenso a sufrir Engineer, site. Siga estas indicaciones en su casa: Estilo de vida No beba alcohol si: Su mdico le indica no hacerlo. Si bebe alcohol: Limite la cantidad que bebe a lo siguiente: De 0 a 1 medida por da para las mujeres. De 0 a 2 medidas por da para los hombres. Sepa cunta cantidad de alcohol hay en las bebidas que toma. En los [address], una medida equivale a una botella de cerveza de 12 oz (355 ml), un vaso de vino de 5 oz (148 ml) o un vaso de una bebida alcohlica de alta graduacin de 1 oz (44 ml). No consuma ningn producto que contenga nicotina o tabaco. Estos productos incluyen cigarrillos, tabaco para Theatre manager y aparatos de vapeo, como los Administrator, Civil Service. Si necesita ayuda para dejar de consumir estos productos, consulte al [employer]. Actividad  Siga un programa de ejercicio regular para  mantenerse en forma. Esto lo ayudar a Radio producer equilibrio. Consulte al mdico qu tipos de ejercicios son adecuados para usted. Si necesita un bastn o un andador, selo segn las recomendaciones del mdico. Utilice calzado con buen apoyo y suela antideslizante. Seguridad  Retire los [employer] puedan causar tropiezos  tales como alfombras, cables u obstculos. Instale equipos de seguridad, como barras para sostn en los baos y barandas de seguridad en las escaleras. Mantenga las habitaciones y los pasillos bien iluminados. Indicaciones generales Hable con el mdico sobre sus riesgos de sufrir una cada. Infrmele a su mdico si: Se cae. Asegrese de informarle a su mdico acerca de todas las cadas, incluso aquellas que parecen ser [employer]. Se siente mareado, cansado (tiene fatiga) o siente que pierde el equilibrio. Use los medicamentos de venta libre y los recetados solamente como se lo haya indicado el mdico. Estos incluyen suplementos. Siga una dieta sana y [place] un peso saludable. Una dieta saludable incluye productos lcteos descremados, carnes bajas en contenido de grasa ([place]), fibra de granos enteros, frijoles y [place] frutas y verduras. Mantngase al da con las vacunas. Realcese los estudios de rutina de la salud, dentales y de Wellsite geologist. Resumen Tener un estilo de vida saludable y recibir cuidados preventivos pueden ayudar a Research scientist (physical sciences) salud y el bienestar despus de los 65 aos de [place]. Realizarse pruebas de deteccin y [employer] es la mejor manera de Engineer, manufacturing un problema de salud de forma temprana y [place] a Automotive engineer una cada. El diagnstico y tratamiento tempranos le brindan la mejor oportunidad de Chief Operating Officer las afecciones mdicas ms comunes en las personas mayores de 65 aos de edad. Las cadas son la causa principal de las fracturas de huesos y lesiones en la cabeza de personas mayores de 65 aos de edad. Tome precauciones para evitar una cada en su casa. Trabaje  con el mdico para saber qu cambios que puede hacer para mejorar su salud y [place], y para prevenir las cadas. Esta informacin no tiene Theme park manager el consejo del mdico. Asegrese de hacerle al mdico cualquier pregunta que tenga. Document Revised: 05/17/2021 Document Reviewed: 05/17/2021 Elsevier Patient Education  2024 Elsevier Inc.     Edwina Barth, MD  Primary Care at [facility]

## 2023-10-03 NOTE — Assessment & Plan Note (Signed)
Well-controlled on Nexium 40 mg daily. 

## 2023-10-03 NOTE — Patient Instructions (Signed)
Mantenimiento de la salud despus de los 65 aos de edad Health Maintenance After Age 66 Despus de los 65 aos de edad, corre un riesgo mayor de padecer ciertas enfermedades e infecciones a largo plazo, como tambin de sufrir lesiones por cadas. Las cadas son la causa principal de las fracturas de huesos y lesiones en la cabeza de personas mayores de 65 aos de edad. Recibir cuidados preventivos de forma regular puede ayudarlo a mantenerse saludable y en buen estado. Los cuidados preventivos incluyen realizarse anlisis de forma regular y realizar cambios en el estilo de vida segn las recomendaciones del mdico. Converse con el mdico sobre lo siguiente: Las pruebas de deteccin y los anlisis que debe realizarse. Una prueba de deteccin es un estudio que se para detectar la presencia de una enfermedad cuando no tiene sntomas. Un plan de dieta y ejercicios adecuado para usted. Qu debo saber sobre las pruebas de deteccin y los anlisis para prevenir cadas? Realizarse pruebas de deteccin y anlisis es la mejor manera de detectar un problema de salud de forma temprana. El diagnstico y tratamiento tempranos le brindan la mejor oportunidad de controlar las afecciones mdicas que son comunes despus de los 65 aos de edad. Ciertas afecciones y elecciones de estilo de vida pueden hacer que sea ms propenso a sufrir una cada. El mdico puede recomendarle lo siguiente: Controles regulares de la visin. Una visin deficiente y afecciones como las cataratas pueden hacer que sea ms propenso a sufrir una cada. Si usa lentes, asegrese de obtener una receta actualizada si su visin cambia. Revisin de medicamentos. Revise regularmente con el mdico todos los medicamentos que toma, incluidos los medicamentos de venta libre. Consulte al mdico sobre los efectos secundarios que pueden hacer que sea ms propenso a sufrir una cada. Informe al mdico si alguno de los medicamentos que toma lo hace sentir mareado o  somnoliento. Controles de fuerza y equilibrio. El mdico puede recomendar ciertos estudios para controlar su fuerza y equilibrio al estar de pie, al caminar o al cambiar de posicin. Examen de los pies. El dolor y el adormecimiento en los pies, como tambin no utilizar el calzado adecuado, pueden hacer que sea ms propenso a sufrir una cada. Pruebas de deteccin, que incluyen las siguientes: Pruebas de deteccin para la osteoporosis. La osteoporosis es una afeccin que hace que los huesos se tornen ms dbiles y se quiebren con ms facilidad. Pruebas de deteccin para la presin arterial. Los cambios en la presin arterial y los medicamentos para controlar la presin arterial pueden hacerlo sentir mareado. Prueba de deteccin de la depresin. Es ms probable que sufra una cada si tiene miedo a caerse, se siente deprimido o se siente incapaz de realizar actividades que sola hacer. Prueba de deteccin de consumo de alcohol. Beber demasiado alcohol puede afectar su equilibrio y puede hacer que sea ms propenso a sufrir una cada. Siga estas indicaciones en su casa: Estilo de vida No beba alcohol si: Su mdico le indica no hacerlo. Si bebe alcohol: Limite la cantidad que bebe a lo siguiente: De 0 a 1 medida por da para las mujeres. De 0 a 2 medidas por da para los hombres. Sepa cunta cantidad de alcohol hay en las bebidas que toma. En los [place], una medida equivale a una botella de cerveza de 12 oz (355 ml), un vaso de vino de 5 oz (148 ml) o un vaso de una bebida alcohlica de alta graduacin de 1 oz (44 ml). No consuma ningn producto que   contenga nicotina o tabaco. Estos productos incluyen cigarrillos, tabaco para mascar y aparatos de vapeo, como los cigarrillos electrnicos. Si necesita ayuda para dejar de consumir estos productos, consulte al mdico. Actividad  Siga un programa de ejercicio regular para mantenerse en forma. Esto lo ayudar a mantener el equilibrio. Consulte al  mdico qu tipos de ejercicios son adecuados para usted. Si necesita un bastn o un andador, selo segn las recomendaciones del mdico. Utilice calzado con buen apoyo y suela antideslizante. Seguridad  Retire los objetos que puedan causar tropiezos tales como alfombras, cables u obstculos. Instale equipos de seguridad, como barras para sostn en los baos y barandas de seguridad en las escaleras. Mantenga las habitaciones y los pasillos bien iluminados. Indicaciones generales Hable con el mdico sobre sus riesgos de sufrir una cada. Infrmele a su mdico si: Se cae. Asegrese de informarle a su mdico acerca de todas las cadas, incluso aquellas que parecen ser menores. Se siente mareado, cansado (tiene fatiga) o siente que pierde el equilibrio. Use los medicamentos de venta libre y los recetados solamente como se lo haya indicado el mdico. Estos incluyen suplementos. Siga una dieta sana y mantenga un peso saludable. Una dieta saludable incluye productos lcteos descremados, carnes bajas en contenido de grasa (magras), fibra de granos enteros, frijoles y muchas frutas y verduras. Mantngase al da con las vacunas. Realcese los estudios de rutina de la salud, dentales y de la vista. Resumen Tener un estilo de vida saludable y recibir cuidados preventivos pueden ayudar a promover la salud y el bienestar despus de los 65 aos de edad. Realizarse pruebas de deteccin y anlisis es la mejor manera de detectar un problema de salud de forma temprana y ayudarlo a evitar una cada. El diagnstico y tratamiento tempranos le brindan la mejor oportunidad de controlar las afecciones mdicas ms comunes en las personas mayores de 65 aos de edad. Las cadas son la causa principal de las fracturas de huesos y lesiones en la cabeza de personas mayores de 65 aos de edad. Tome precauciones para evitar una cada en su casa. Trabaje con el mdico para saber qu cambios que puede hacer para mejorar su salud y  bienestar, y para prevenir las cadas. Esta informacin no tiene como fin reemplazar el consejo del mdico. Asegrese de hacerle al mdico cualquier pregunta que tenga. Document Revised: 05/17/2021 Document Reviewed: 05/17/2021 Elsevier Patient Education  2024 Elsevier Inc.  

## 2023-10-03 NOTE — Assessment & Plan Note (Signed)
Well-controlled hypertension However due to low blood pressure episodes at home recommend to lower dose of metoprolol succinate to 25 mg daily and continue lisinopril 40 mg daily Cardiovascular risks associated with hypertension discussed. Dietary approaches to stop hypertension discussed. 5 follow-up in 6 months.

## 2023-10-03 NOTE — Assessment & Plan Note (Signed)
Diet and nutrition discussed Benefits of exercise discussed Blood work including hemoglobin A1c done today.

## 2023-10-03 NOTE — Assessment & Plan Note (Signed)
Chronic stable condition Diet and nutrition discussed Has developed allergies to greasy foods Continue rosuvastatin 20 mg daily The 10-year ASCVD risk score (Arnett DK, et al., 2019) is: 7.8%   Values used to calculate the score:     Age: 67 years     Sex: Female     Is Non-Hispanic African American: No     Diabetic: No     Tobacco smoker: No     Systolic Blood Pressure: 130 mmHg     Is BP treated: Yes     HDL Cholesterol: 58.4 mg/dL     Total Cholesterol: 172 mg/dL

## 2023-12-09 ENCOUNTER — Ambulatory Visit
Admission: RE | Admit: 2023-12-09 | Discharge: 2023-12-09 | Disposition: A | Discharge status: Home / Self Care | Payer: Managed Care, Other (non HMO) | Source: Ambulatory Visit | Attending: Emergency Medicine | Admitting: Emergency Medicine

## 2023-12-10 ENCOUNTER — Other Ambulatory Visit: Payer: Self-pay | Admitting: Emergency Medicine

## 2023-12-10 DIAGNOSIS — M810 Age-related osteoporosis without current pathological fracture: Secondary | ICD-10-CM | POA: Insufficient documentation

## 2023-12-10 MED ORDER — ALENDRONATE SODIUM 70 MG PO TABS
70.0000 mg | ORAL_TABLET | ORAL | 11 refills | Status: AC
Start: 2023-12-10 — End: ?

## 2023-12-12 ENCOUNTER — Telehealth: Payer: Self-pay

## 2023-12-12 NOTE — Telephone Encounter (Signed)
Copied from CRM [id number]. Topic: Clinical - Lab/Test Results >> Dec 12, 2023  9:44 AM Elizebeth Brooking wrote: Reason for CRM: Patient called in wanting lab results she also would like to know why was a medication prescribed to her she has request a call back

## 2023-12-13 NOTE — Telephone Encounter (Signed)
LVM for patient to call back regarding her questions and lab results

## 2023-12-28 ENCOUNTER — Other Ambulatory Visit: Payer: Self-pay | Admitting: Emergency Medicine

## 2024-02-14 ENCOUNTER — Emergency Department (HOSPITAL_COMMUNITY): Payer: Managed Care, Other (non HMO)

## 2024-02-14 ENCOUNTER — Other Ambulatory Visit: Payer: Self-pay

## 2024-02-14 ENCOUNTER — Emergency Department (HOSPITAL_COMMUNITY)
Admission: EM | Admit: 2024-02-14 | Discharge: 2024-02-14 | Disposition: A | Discharge status: Home / Self Care | Payer: Managed Care, Other (non HMO) | Attending: Emergency Medicine | Admitting: Emergency Medicine

## 2024-02-14 DIAGNOSIS — I10 Essential (primary) hypertension: Secondary | ICD-10-CM | POA: Diagnosis not present

## 2024-02-14 DIAGNOSIS — R079 Chest pain, unspecified: Secondary | ICD-10-CM | POA: Diagnosis present

## 2024-02-14 DIAGNOSIS — R0602 Shortness of breath: Secondary | ICD-10-CM | POA: Diagnosis not present

## 2024-02-14 DIAGNOSIS — I2510 Atherosclerotic heart disease of native coronary artery without angina pectoris: Secondary | ICD-10-CM | POA: Diagnosis not present

## 2024-02-14 DIAGNOSIS — Z79899 Other long term (current) drug therapy: Secondary | ICD-10-CM | POA: Insufficient documentation

## 2024-02-14 DIAGNOSIS — R0789 Other chest pain: Secondary | ICD-10-CM | POA: Diagnosis not present

## 2024-02-14 LAB — BASIC METABOLIC PANEL
Anion gap: 8 (ref 5–15)
BUN: 18 mg/dL (ref 8–23)
CO2: 23 mmol/L (ref 22–32)
Calcium: 9.4 mg/dL (ref 8.9–10.3)
Chloride: 108 mmol/L (ref 98–111)
Creatinine, Ser: 0.6 mg/dL (ref 0.44–1.00)
GFR, Estimated: >60 mL/min (ref >60)
Glucose, Bld: 78 mg/dL (ref 70–99)
Potassium: 3.8 mmol/L (ref 3.5–5.1)
Sodium: 139 mmol/L (ref 135–145)

## 2024-02-14 LAB — CBC
HCT: 35.7 % — ABNORMAL LOW (ref 36.0–46.0)
Hemoglobin: 11.6 g/dL — ABNORMAL LOW (ref 12.0–15.0)
MCH: 24.8 pg — ABNORMAL LOW (ref 26.0–34.0)
MCHC: 32.5 g/dL (ref 30.0–36.0)
MCV: 76.3 fL — ABNORMAL LOW (ref 80.0–100.0)
Platelets: 269 10*3/uL (ref 150–400)
RBC: 4.68 MIL/uL (ref 3.87–5.11)
RDW: 14.9 % (ref 11.5–15.5)
WBC: 7.8 10*3/uL (ref 4.0–10.5)
nRBC: 0 % (ref 0.0–0.2)

## 2024-02-14 LAB — TROPONIN I (HIGH SENSITIVITY)
Troponin I (High Sensitivity): 3 ng/L (ref <18)
Troponin I (High Sensitivity): 3 ng/L (ref <18)

## 2024-02-14 LAB — BRAIN NATRIURETIC PEPTIDE: B Natriuretic Peptide: 97.6 pg/mL (ref 0.0–100.0)

## 2024-02-14 LAB — D-DIMER, QUANTITATIVE: D-Dimer, Quant: 0.74 ug{FEU}/mL — ABNORMAL HIGH (ref 0.00–0.50)

## 2024-02-14 MED ORDER — ACETAMINOPHEN 500 MG PO TABS
1000.0000 mg | ORAL_TABLET | Freq: Once | ORAL | Status: DC
  Filled 2024-02-14: qty 2

## 2024-02-14 MED ORDER — FUROSEMIDE 20 MG PO TABS: 20.0000 mg | ORAL_TABLET | Freq: Every day | ORAL | 0 refills | Status: DC

## 2024-02-14 MED ORDER — IOHEXOL 350 MG/ML SOLN
75.0000 mL | Freq: Once | INTRAVENOUS | Status: AC | PRN
  Administered 2024-02-14: 75 mL via INTRAVENOUS

## 2024-02-14 NOTE — ED Provider Notes (Signed)
  Physical Exam  BP 129/82   Pulse 60   Temp 98.4 F (36.9 C) (Oral)   Resp 16   Ht 5\' 6"  (1.676 m)   Wt 83.9 kg   SpO2 94%   BMI 29.86 kg/m   Physical Exam Vitals and nursing note reviewed.  Constitutional:      General: She is not in acute distress.    Appearance: She is not toxic-appearing.  HENT:     Head: Normocephalic and atraumatic.  Eyes:     General: No scleral icterus.    Conjunctiva/sclera: Conjunctivae normal.  Cardiovascular:     Rate and Rhythm: Normal rate and regular rhythm.     Pulses: Normal pulses.     Heart sounds: Normal heart sounds.  Pulmonary:     Effort: Pulmonary effort is normal. No respiratory distress.     Breath sounds: Normal breath sounds.  Abdominal:     General: Abdomen is flat. Bowel sounds are normal.     Palpations: Abdomen is soft.     Tenderness: There is no abdominal tenderness.  Musculoskeletal:     Right lower leg: No edema.     Left lower leg: No edema.  Skin:    General: Skin is warm and dry.     Findings: No lesion.  Neurological:     General: No focal deficit present.     Mental Status: She is alert and oriented to person, place, and time. Mental status is at baseline.     Procedures  Procedures  ED Course / MDM   Clinical Course as of 02/14/24 1624  Fri Feb 14, 2024  1530 RDW: 14.9 [OZ]    Clinical Course User Index [OZ] Smitty Knudsen, PA-C   Medical Decision Making Amount and/or Complexity of Data Reviewed Labs: ordered. Decision-making details documented in ED Course. Radiology: ordered.  Risk OTC drugs. Prescription drug management.   Patient was received in handoff from prior ED PA.  Patient has past medical history of hypertension, hyperlipidemia.  She is coming in with several days of intermittent burning chest pain.  She is not currently having chest pain.  She is hemodynamically stable. Please see prior note for further HPI.  Patient has elevated D-dimer thus dispo was pending repeat troponin  as well as CT PE.   CT scan shows vascular congestion and mild pleural effusion.  Discussed all findings and imaging with patient. Discussed case with attending provider who agrees to plan.   Reassessment of patient she continues to be chest pain-free.  I have ambulated her with pulse ox.  She had no respiratory distress, shortness of breath or hypoxia with ambulation.  I did discuss all of her imaging results with her.  I added on BNP which was normal.  She has no sign of fluid overload on exam.  Given reassuring workup feel that stable for follow-up.  I will route to cardiology given imaging results.  Patient family member in room agreed to plan.       [name] 02/14/24 2322    Lorre Nick, MD 02/18/24 [phone]

## 2024-02-14 NOTE — ED Triage Notes (Addendum)
Pt arrived via POV. C/o intermittent stinging central chest pain that radiates to back that began last night. Mild SOB.  Pt reports no previous cardiac hx

## 2024-02-14 NOTE — Discharge Instructions (Addendum)
You were seen in emergency room today for your chest pain.  I have sent a referral to cardiology they should call you to schedule appointment for follow-up.  Your CT scan today shows no blood clot.  It does show vascular congestion and a small amount of fluid around your lungs which is likely contributing to your symptoms.  Please check blood pressure at home as discussed you can write down blood pressure readings and discuss with cardiology / primary care as well.   I have sent Lasix to take as needed for lower extremity swelling or shortness of breath. You will need to follow up to have labs reached with either cardiology or PCP next week.   Return to emergency room with chest pain or shortness of breath, new or worsening symptoms.

## 2024-02-14 NOTE — ED Provider Notes (Signed)
Emily [name] Provider Note   CSN: [id number] Arrival date & time: 02/14/24  1244     History Chief Complaint  Patient presents with  . Chest Pain    Emily [name] is a 68 y.o. female.  Patient past history significant for hypertension, hypercholesterolemia, coronary artery disease presents the emergency department with concerns of chest pain.  Endorsing intermittent chest pain that is a sticking sensation in the central chest with occasional radiation towards the back.  States that this started last night.  Endorses mild shortness of breath but denies any nausea, vomiting, diaphoresis.  No prior cardiac history as far she is aware.  Not on blood thinners.  States that last night that she took a dose of her cholesterol medicine and Xanax does help to alleviate her symptoms.   Chest Pain      Home Medications Prior to Admission medications   Medication Sig Start Date End Date Taking? Authorizing Provider  acetic acid-hydrocortisone (VOSOL-HC) OTIC solution Place 3 drops into the left ear 3 (three) times daily. 05/21/23   Georgina Quint, MD  alendronate (FOSAMAX) 70 MG tablet Take 1 tablet (70 mg total) by mouth every 7 (seven) days. Take with a full glass of water on an empty stomach. 12/10/23   Georgina Quint, MD  ALPRAZolam Prudy Feeler) 0.5 MG tablet TAKE 1 TABLET BY MOUTH EVERY DAY AS NEEDED FOR ANXIETY 08/27/23   Georgina Quint, MD  Ascorbic Acid (VITAMIN C) 1000 MG tablet Take 1,000 mg by mouth daily.    [provider]  CALCIUM PO Take 1 tablet by mouth daily.     [provider]  Cholecalciferol (VITAMIN D3 PO) Take 125 mg by mouth daily.    [provider]  ciclopirox (PENLAC) 8 % solution Apply topically at bedtime. Apply over nail and surrounding skin. Apply daily over previous coat. After seven (7) days, may remove with alcohol and continue cycle. 11/12/22   Edwin Cap, DPM   Coenzyme Q10 (CO Q-10 PO) Take 18 mg by mouth daily.    [provider]  cyclobenzaprine (FLEXERIL) 5 MG tablet     [provider]  esomeprazole (NEXIUM) 40 MG capsule Take 40 mg by mouth daily at 12 noon.    [provider]  famotidine (PEPCID) 20 MG tablet Take 20 mg by mouth at bedtime. Patient not taking: Reported on 07/10/2023    [provider]  lisinopril (ZESTRIL) 40 MG tablet TAKE 1 TABLET BY MOUTH EVERY DAY 02/28/23   Georgina Quint, MD  metoprolol succinate (TOPROL-XL) 25 MG 24 hr tablet Take 1 tablet (25 mg total) by mouth daily. 10/03/23   Georgina Quint, MD  Multiple Vitamin (MULTI-VITAMIN DAILY PO) Take by mouth daily. TAKE 1 TABLET DAILY    [provider]  NIFEdipine (ADALAT CC) 30 MG 24 hr tablet     [provider]  predniSONE (DELTASONE) 5 MG tablet Take 1 tablet (5 mg total) by mouth 2 (two) times daily with a meal. 09/13/23   Eldred Manges, MD  rosuvastatin (CRESTOR) 20 MG tablet TAKE 1 TABLET BY MOUTH EVERY DAY 08/25/23   Georgina Quint, MD  Turmeric (QC TUMERIC COMPLEX PO) Take by mouth daily. NATURAL SUPPLEMENT    [provider]      Allergies    Ibuprofen, Aspirin, Atorvastatin, and Nsaids    Review of Systems   Review of Systems  Cardiovascular:  Positive for  chest pain.  All other systems reviewed and are negative.   Physical Exam Updated Vital Signs BP 129/82   Pulse 60   Temp 98.4 F (36.9 C) (Oral)   Resp 16   Ht 5\' 6"  (1.676 m)   Wt 83.9 kg   SpO2 94%   BMI 29.86 kg/m  Physical Exam Vitals and nursing note reviewed.  Constitutional:      General: She is not in acute distress.    Appearance: She is well-developed.  HENT:     Head: Normocephalic and atraumatic.  Eyes:     Conjunctiva/sclera: Conjunctivae normal.  Cardiovascular:     Rate and Rhythm: Normal rate and regular rhythm.     Heart sounds: No murmur heard.    Comments: Reproducible chest wall  tenderness to palpation.  No audible heart murmur or abnormal lung sounds. Pulmonary:     Effort: Pulmonary effort is normal. No respiratory distress.     Breath sounds: Normal breath sounds.  Abdominal:     Palpations: Abdomen is soft.     Tenderness: There is no abdominal tenderness.  Musculoskeletal:        General: No swelling.       Arms:     Cervical back: Neck supple.     Comments: Reproducible chest wall tenderness to palpation over the left upper chest wall towards the mid-sternal area.  No audible heart murmur or abnormal lung sounds.    Skin:    General: Skin is warm and dry.     Capillary Refill: Capillary refill takes less than 2 seconds.  Neurological:     Mental Status: She is alert.  Psychiatric:        Mood and Affect: Mood normal.     ED Results / Procedures / Treatments   Labs (all labs ordered are listed, but only abnormal results are displayed) Labs Reviewed  CBC - Abnormal; Notable for the following components:      Result Value   Hemoglobin 11.6 (*)    HCT 35.7 (*)    MCV 76.3 (*)    MCH 24.8 (*)    All other components within normal limits  D-DIMER, QUANTITATIVE - Abnormal; Notable for the following components:   D-Dimer, Quant 0.74 (*)    All other components within normal limits  BASIC METABOLIC PANEL  TROPONIN I (HIGH SENSITIVITY)  TROPONIN I (HIGH SENSITIVITY)    EKG EKG Interpretation Date/Time:  Friday February 14 2024 12:58:11 EST Ventricular Rate:  79 PR Interval:  206 QRS Duration:  94 QT Interval:  380 QTC Calculation: 436 R Axis:   53  Text Interpretation: Sinus rhythm LAE, consider biatrial enlargement Confirmed by Coralee Pesa [id number]) on 02/14/2024 2:28:03 PM  Radiology DG Chest Port 1 View Result Date: 02/14/2024 CLINICAL DATA:  Central chest pain.  Mild shortness of breath. EXAM: PORTABLE CHEST 1 VIEW COMPARISON:  04/25/2022. FINDINGS: Low lung volume. There are mildly increased interstitial markings, which are  nonspecific but appear grossly similar to the prior study, considering the low lung volume. No frank pulmonary edema. Bilateral lung fields are clear. Bilateral costophrenic angles are clear. Stable cardio-mediastinal silhouette. No acute osseous abnormalities. The soft tissues are within normal limits. IMPRESSION: No active disease. Electronically Signed   By: Jules Schick M.D.   On: 02/14/2024 14:04    Procedures Procedures    Medications Ordered in ED Medications - No data to display  ED Course/ Medical Decision Making/ A&P Clinical Course as of 02/14/24 1530  Fri Feb 14, 2024  1530 RDW: 14.9 [OZ]    Clinical Course User Index [OZ] Smitty Knudsen, PA-C                                 Medical Decision Making Amount and/or Complexity of Data Reviewed Labs: ordered. Radiology: ordered.   This patient presents to the ED for concern of chest pain.  Differential diagnosis includes COVID-19, pneumonia, PE, aortic dissection   Lab Tests:  I Ordered, and personally interpreted labs.  The pertinent results include: CBC unremarkable, D-dimer elevated at 0.74 which is positive even with age-adjusted and, troponin 3, BMP unremarkable   Imaging Studies ordered:  I ordered imaging studies including chest x-ray I independently visualized and interpreted imaging which showed no acute cardiopulmonary process seen I agree with the radiologist interpretation   Problem List / ED Course:  Patient with past history significant for hypertension, hypercholesterolemia, CAD presents to ED with concerns of chest pain.  Endorses waking up last night with centralized chest pain with radiation towards her upper back.  This has been intermittent and not persistent.  Denies any specific worsening with activity or movement.  Mild shortness of breath but denies any hemoptysis.  No prior history of PE or DVT.  Not on blood thinners. On exam, no wheezing, rales, or rhonchi.  No murmur, rubs, or gallops.   No notable lower extremity edema.  She denies a hemoptysis or prior PE or DVT.  Not on blood thinners at this time.  Given patient's age, will obtain cardiac workup and add on D-dimer to rule out possibility of PE as patient is unable to use PERC criteria. Patient CBC is unremarkable.  D-dimer elevated at 0.74.  Discussed with patient findings of elevated D-dimer requiring CT angio chest to rule out PE.  Although not ideal study, will also be able to decipher there is any evidence of dissection.  Doubtful given patient's symptoms appear to be mild in nature.  Patient currently is pain-free with normal blood pressure.  3:18 PM Care of Emily [name] transferred to PA Barrett and Dr. Freida Busman at the end of my shift as the patient will require reassessment once labs/imaging have resulted. Patient presentation, ED course, and plan of care discussed with review of all pertinent labs and imaging. Please see his/her note for further details regarding further ED course and disposition. Plan at time of handoff is disposition per results of CT angio chest. Symptoms appear to be consistent with chest wall tenderness/costochondritis but will need to r/o PE with elevated dimer. This may be altered or completely changed at the discretion of the oncoming team pending results of further workup.   Final Clinical Impression(s) / ED Diagnoses Final diagnoses:  None    Rx / DC Orders ED Discharge Orders     None         Smitty Knudsen, PA-C 02/14/24 1518    Horton, Clabe Seal, DO 02/14/24 1537

## 2024-02-18 ENCOUNTER — Telehealth: Payer: Self-pay

## 2024-02-18 NOTE — Transitions of Care (Post Inpatient/ED Visit) (Signed)
   02/18/2024  Name: Graysen Depaula MRN: [id number] DOB: [birth year]  Today's TOC FU Call Status: Today's TOC FU Call Status:: Unsuccessful Call (1st Attempt) Unsuccessful Call (1st Attempt) Date: 02/18/24  Attempted to reach the patient regarding the most recent Inpatient/ED visit.  Follow Up Plan: Additional outreach attempts will be made to reach the patient to complete the Transitions of Care (Post Inpatient/ED visit) call.   Signature Delsa Grana, RMA

## 2024-02-20 ENCOUNTER — Telehealth: Payer: Self-pay | Admitting: Emergency Medicine

## 2024-02-20 ENCOUNTER — Ambulatory Visit: Payer: Self-pay | Admitting: Emergency Medicine

## 2024-02-20 ENCOUNTER — Encounter: Payer: Self-pay | Admitting: Radiology

## 2024-02-20 NOTE — Telephone Encounter (Signed)
 Copied from CRM [id number]. Topic: Clinical - Red Word Triage >> Feb 20, 2024  3:24 PM Emily [name] wrote: Red Word that prompted transfer to Nurse Triage: Chest pains   Chief Complaint: Chest pain Symptoms: Left sided chest pain  Frequency: Intermittent  Pertinent Negatives: Patient denies any other symptom  Disposition: [] ED /[] Urgent Care (no appt availability in office) / [x] Appointment(In office/virtual)/ []  [facility]/ [] Home Care/ [] Refused Recommended Disposition /[] [facility]/ []  Follow-up with PCP Additional Notes: Patient reports that she was seen in the ER on 02/14/24 for chest pain. She states that she was referred to a cardiologist but has not heard from them yet. Patient reports she is still intermittently experiencing mild left sided chest pain. Appointment made for the patient on Tuesday for a follow-up.    Reason for Disposition  [1] Chest pain(s) lasting a few seconds AND [2] persists > 3 days  Answer Assessment - Initial Assessment Questions 1. LOCATION: "Where does it hurt?"       Left side 2. RADIATION: "Does the pain go anywhere else?" (e.g., into neck, jaw, arms, back)     No 3. ONSET: "When did the chest pain begin?" (Minutes, hours or days)      Today 4. PATTERN: "Does the pain come and go, or has it been constant since it started?"  "Does it get worse with exertion?"      Intermittent  5. DURATION: "How long does it last" (e.g., seconds, minutes, hours)     "Just a little bit" 6. SEVERITY: "How bad is the pain?"  (e.g., Scale 1-10; mild, moderate, or severe)    - MILD (1-3): doesn't interfere with normal activities     - MODERATE (4-7): interferes with normal activities or awakens from sleep    - SEVERE (8-10): excruciating pain, unable to do any normal activities       Mild 7. CARDIAC RISK FACTORS: "Do you have any history of heart problems or risk factors for heart disease?" (e.g., angina, prior heart attack; diabetes, high blood  pressure, high cholesterol, smoker, or strong family history of heart disease)     Carotid artery disease  8. PULMONARY RISK FACTORS: "Do you have any history of lung disease?"  (e.g., blood clots in lung, asthma, emphysema, birth control pills)     No 9. CAUSE: "What do you think is causing the chest pain?"     Unsure  10. OTHER SYMPTOMS: "Do you have any other symptoms?" (e.g., dizziness, nausea, vomiting, sweating, fever, difficulty breathing, cough)       No  Protocols used: Chest Pain-A-AH

## 2024-02-20 NOTE — Telephone Encounter (Signed)
 Copied from CRM [id number]. Topic: Referral - Status >> Feb 20, 2024  3:22 PM Emily [name] wrote: Reason for CRM: Patient is requesting a update on her referral as she is still having chest pains. Patient transferred to nurse triage.

## 2024-02-25 ENCOUNTER — Inpatient Hospital Stay: Payer: Managed Care, Other (non HMO) | Admitting: Emergency Medicine

## 2024-03-03 ENCOUNTER — Encounter: Payer: Self-pay | Admitting: Emergency Medicine

## 2024-03-03 ENCOUNTER — Ambulatory Visit: Admitting: Emergency Medicine

## 2024-03-03 ENCOUNTER — Ambulatory Visit (INDEPENDENT_AMBULATORY_CARE_PROVIDER_SITE_OTHER)

## 2024-03-03 VITALS — BP 122/78 | HR 80 | Temp 98.9°F | Ht 66.0 in | Wt 186.0 lb

## 2024-03-03 DIAGNOSIS — R0989 Other specified symptoms and signs involving the circulatory and respiratory systems: Secondary | ICD-10-CM | POA: Diagnosis not present

## 2024-03-03 DIAGNOSIS — J988 Other specified respiratory disorders: Secondary | ICD-10-CM | POA: Diagnosis not present

## 2024-03-03 DIAGNOSIS — B9789 Other viral agents as the cause of diseases classified elsewhere: Secondary | ICD-10-CM | POA: Diagnosis not present

## 2024-03-03 DIAGNOSIS — I10 Essential (primary) hypertension: Secondary | ICD-10-CM | POA: Diagnosis not present

## 2024-03-03 DIAGNOSIS — E785 Hyperlipidemia, unspecified: Secondary | ICD-10-CM

## 2024-03-03 DIAGNOSIS — R7303 Prediabetes: Secondary | ICD-10-CM

## 2024-03-03 NOTE — Assessment & Plan Note (Signed)
 Chronic stable condition Diet and nutrition discussed Has developed allergies to greasy foods Continue rosuvastatin 20 mg daily

## 2024-03-03 NOTE — Assessment & Plan Note (Signed)
Diet and nutrition discussed Benefits of exercise discussed

## 2024-03-03 NOTE — Patient Instructions (Signed)
Infeccin respiratoria viral Viral Respiratory Infection Una infeccin respiratoria viral es una enfermedad que afecta las partes del cuerpo que utilizamos para Industrial/product designer. Estas incluyen los pulmones, la nariz y Administrator. Es causada por un germen llamado virus. Algunos ejemplos de este tipo de infeccin son los siguientes: Un resfro. La gripe (influenza). Una infeccin por el virus respiratorio sincicial (VRS). Cules son las causas? La causa de esta afeccin es un virus. Se transmite de [place] persona a otra. Puede contraer el virus si: Inhala gotitas de una persona que est enferma. Tiene contacto con personas que estn enfermas. Toca mucosidad u otro lquido de una persona que est enferma. Cules son los signos o sntomas? Los sntomas de esta afeccin incluyen: Secrecin o congestin nasal. Dolor de garganta. Tos. Falta de aire. Dificultad para respirar. Lquido verde o [employer]. Otros sntomas pueden incluir: Fiebre. Sudoracin o escalofros. Cansancio (fatiga). Dolores musculares. Dolor de [place]. Cmo se trata? El tratamiento de esta afeccin puede incluir: Medicamentos para tratar los virus. Medicamentos que facilitan la respiracin. Medicamentos que se pulverizan dentro de la Clinical cytogeneticist. Paracetamol o antiinflamatorios no esteroideos (AINE), como ibuprofeno, para tratar [employer]. Siga estas instrucciones en su casa: Control del dolor y la congestin Use los medicamentos de venta libre y los recetados solamente como se lo haya indicado el mdico. Si le duele la garganta, haga grgaras de agua con sal. Haga esto entre 3 o 4 veces por da, segn sea necesario. Para preparar agua con sal, disuelva de  a 1 cucharadita (de 3 a 6 g) de sal en 1 taza (237 ml) de agua tibia. Asegrese de que se disuelva toda la sal. Use gotas para la nariz hechas con agua salada. Estas ayudan con la secrecin (congestin). Tambin ayudan a Chartered loss adjuster piel alrededor de Chief Financial Officer. Tome 2 cucharaditas (10 ml) de miel a la hora de acostarse para disminuir la tos por la noche. No d miel a nios menores de 1 ao. Beba suficiente lquido para Radio producer pis (la orina) de color amarillo plido. Instrucciones generales  Descanse todo lo que pueda. No beba alcohol. No fume ni consuma ningn producto que contenga nicotina o tabaco. Si necesita ayuda para dejar de fumar, consulte al mdico. Concurra a todas las visitas de seguimiento. Cmo se evita?     Colquese la vacuna antigripal todos los [place]. Pregntele al mdico cundo debe aplicarse la vacuna contra la gripe. No permita que otras personas contraigan sus grmenes. Si est enfermo: Lvese las manos frecuentemente con agua y [place]. Lvese las manos despus de toser o Engineering geologist. Lvese las manos durante al menos 20 segundos. Use un desinfectante para manos si no dispone de [place] y [place]. Cbrase la boca al toser. Cbrase la nariz y la boca cuando estornude. No comparta vasos ni utensilios para comer. Limpie los objetos usados comnmente con frecuencia. Limpie las superficies que se tocan comnmente. Lanny Hurst en su casa y no concurra al Aleen Campi ni a la escuela. Evite el contacto con personas que estn enfermas durante la temporada de resfro y gripe. Esta es en otoo e invierno. Solicite ayuda si: Los sntomas duran [address] o ms. Los sntomas empeoran con [employer]. Repentinamente, siente un dolor muy intenso en el rostro o la frente. Se hinchan mucho algunas partes de la mandbula o del cuello. Le falta el aire. Solicite ayuda de inmediato si: Electronics engineer u opresin en el pecho. Tiene dificultad para respirar. Se siente mareado o como si fuera a desmayarse.  Sigue vomitando y [place]. Se siente confundido. Estos sntomas pueden Customer service manager. Solicite ayuda de inmediato. Comunquese con el servicio de emergencias de su localidad (911 en los [place]). No espere a ver si los sntomas  desaparecen. No conduzca por sus propios medios [employer] hospital. Resumen Una infeccin respiratoria viral es una enfermedad que afecta las partes del cuerpo que utilizamos para Industrial/product designer. Entre los ejemplos de esta enfermedad, se incluyen el resfro, la gripe y la infeccin por el virus respiratorio sincicial (VRS). La infeccin puede causar secrecin nasal, tos, dolor de garganta y [place]. Siga las indicaciones del mdico acerca de tomar medicamentos, beber gran cantidad de lquido, lavarse las manos, Lawyer en casa y Automotive engineer el contacto con personas enfermas. Esta informacin no tiene Theme park manager el consejo del mdico. Asegrese de hacerle al mdico cualquier pregunta que tenga. Document Revised: 04/14/2021 Document Reviewed: 04/14/2021 Elsevier Patient Education  2024 [employer].

## 2024-03-03 NOTE — Assessment & Plan Note (Signed)
 BP Readings from Last 3 Encounters:  03/03/24 122/78  02/14/24 (!) 169/82  10/03/23 130/72  Well-controlled hypertension Continue lisinopril 40 mg and metoprolol succinate 25 mg daily Cardiovascular risks associated with hypertension discussed Diet and nutrition discussed

## 2024-03-03 NOTE — Progress Notes (Signed)
 Emily [name] 68 y.o.   Chief Complaint  Patient presents with   Cough    Patient states having low grade fever, cough, mucus, sore throat. Patient states it start Friday. Emily [name] did not get tested     HISTORY OF PRESENT ILLNESS: This is a 68 y.o. female complaining of flulike symptoms that started last Friday, 4 days ago. Today feels about 50% better. Seen in the emergency department on 02/14/2024 for nonspecific chest pain Assessment and plan as follows: Patient was received in handoff from prior ED PA.  Patient has past medical history of hypertension, hyperlipidemia.  Emily [name] is coming in with several days of intermittent burning chest pain.  Emily [name] is not currently having chest pain.  Emily [name] is hemodynamically stable. Please see prior note for further HPI.   Patient has elevated D-dimer thus dispo was pending repeat troponin as well as CT PE.    CT scan shows vascular congestion and mild pleural effusion.  Discussed all findings and imaging with patient. Discussed case with attending provider who agrees to plan.    Reassessment of patient Emily [name] continues to be chest pain-free.  I have ambulated Emily [name] with pulse ox.  Emily [name] had no respiratory distress, shortness of breath or hypoxia with ambulation.  I did discuss all of Emily [name] imaging results with Emily [name].  I added on BNP which was normal.  Emily [name] has no sign of fluid overload on exam.  Given reassuring workup feel that stable for follow-up.  I will route to cardiology given imaging results.  Patient family member in room agreed to plan.   Emily [name] 02/14/24 2322  Chest CT impression as follows: IMPRESSION: 1. No pulmonary emboli. 2. Mild bilateral lower lobe and minimal bilateral upper lobe dependent ground-glass interstitial opacities. This is most likely due to mild pulmonary edema. 3. Mild cardiomegaly. 4. Borderline dilated central pulmonary arteries, which can be seen with pulmonary arterial hypertension. 5. Moderate-sized hiatal  hernia. 6.  Calcific coronary artery and aortic atherosclerosis.   Aortic Atherosclerosis (ICD10-I70.0).     Electronically Signed   By: Beckie Salts M.D.   On: 02/14/2024 17:28  Cough Associated symptoms include a fever and a sore throat. Pertinent negatives include no chest pain, chills, headaches, rash or shortness of breath.     Prior to Admission medications   Medication Sig Start Date End Date Taking? Authorizing Provider  alendronate (FOSAMAX) 70 MG tablet Take 1 tablet (70 mg total) by mouth every 7 (seven) days. Take with a full glass of water on an empty stomach. 12/10/23  Yes Deretha Ertle, Eilleen Kempf, MD  ALPRAZolam Prudy Feeler) 0.5 MG tablet TAKE 1 TABLET BY MOUTH EVERY DAY AS NEEDED FOR ANXIETY 08/27/23  Yes Zoei Amison, Eilleen Kempf, MD  Ascorbic Acid (VITAMIN C) 1000 MG tablet Take 1,000 mg by mouth daily.   Yes [provider]  CALCIUM PO Take 1 tablet by mouth daily.    Yes [provider]  Cholecalciferol (VITAMIN D3 PO) Take 125 mg by mouth daily.   Yes [provider]  ciclopirox (PENLAC) 8 % solution Apply topically at bedtime. Apply over nail and surrounding skin. Apply daily over previous coat. After seven (7) days, may remove with alcohol and continue cycle. 11/12/22  Yes McDonald, Rachelle Hora, DPM  Coenzyme Q10 (CO Q-10 PO) Take 18 mg by mouth daily.   Yes [provider]  cyclobenzaprine (FLEXERIL) 5 MG tablet    Yes [provider]  esomeprazole (NEXIUM) 40 MG capsule Take 40  mg by mouth daily at 12 noon.   Yes [provider]  furosemide (LASIX) 20 MG tablet Take 1 tablet (20 mg total) by mouth daily. 02/14/24  Yes Barrett, Jamie N, PA-C  lisinopril (ZESTRIL) 40 MG tablet TAKE 1 TABLET BY MOUTH EVERY DAY 02/28/23  Yes Kandon Hosking, Eilleen Kempf, MD  metoprolol succinate (TOPROL-XL) 25 MG 24 hr tablet Take 1 tablet (25 mg total) by mouth daily. 10/03/23  Yes Dahmir Epperly, Eilleen Kempf, MD  Multiple Vitamin (MULTI-VITAMIN DAILY PO) Take by  mouth daily. TAKE 1 TABLET DAILY   Yes [provider]  NIFEdipine (ADALAT CC) 30 MG 24 hr tablet    Yes [provider]  rosuvastatin (CRESTOR) 20 MG tablet TAKE 1 TABLET BY MOUTH EVERY DAY 08/25/23  Yes Tasheba Henson, Eilleen Kempf, MD  Turmeric (QC TUMERIC COMPLEX PO) Take by mouth daily. NATURAL SUPPLEMENT   Yes [provider]  acetic acid-hydrocortisone (VOSOL-HC) OTIC solution Place 3 drops into the left ear 3 (three) times daily. Patient not taking: Reported on 03/03/2024 05/21/23   Georgina Quint, MD  famotidine (PEPCID) 20 MG tablet Take 20 mg by mouth at bedtime. Patient not taking: Reported on 03/03/2024    [provider]  predniSONE (DELTASONE) 5 MG tablet Take 1 tablet (5 mg total) by mouth 2 (two) times daily with a meal. Patient not taking: Reported on 03/03/2024 09/13/23   Eldred Manges, MD    Allergies  Allergen Reactions   Ibuprofen    Aspirin Hives   Atorvastatin     Yellow fingernails   Nsaids Hives    Patient Active Problem List   Diagnosis Date Noted   Age-related osteoporosis without current pathological fracture 12/10/2023   Onychomycosis 10/30/2022   Prediabetes 02/12/2022   Hypertensive urgency 11/28/2021   History of arthritis 08/15/2020   History of gastroesophageal reflux (GERD) 08/15/2020   Carotid artery disease (HCC) 12/19/2018   Bilateral carotid artery stenosis, R- 40-59%, L 1-39% 11/2017 03/25/2018   Dyslipidemia 03/25/2018   GERD (gastroesophageal reflux disease) 04/12/2016   Chronic high back pain 09/24/2015   Essential hypertension 10/03/2012   Hyperlipidemia 10/03/2012   DYSPEPSIA&OTHER SPEC DISORDERS FUNCTION STOMACH 07/20/2009    Past Medical History:  Diagnosis Date   Abdominal pain, chronic, epigastric 11/02/2015   Started amitriptyline at hospitalization 04/13/2016 EGD x 2 negative Also w/ chronic chest wall and back pain Would try not to work up further    Anxiety    Arthritis    BACK,KNEES   Beta  thalassemia minor    diagnosed by hematology Dr. Dorann Lodge, also with sickle cell trait   Blood transfusion without reported diagnosis    "LONG TIME AGO"   Chronic high back pain 09/24/2015   Colon polyps    GERD (gastroesophageal reflux disease)    Heart murmur    "SMALL DOESN'T BOTHER ME"   Hyperlipidemia    Hypertension    Sickle cell trait (HCC)    diagnosed by hematology Dr. Pamelia Hoit, coexsisting with beta-thalassemia minor    Past Surgical History:  Procedure Laterality Date   ABDOMINAL HYSTERECTOMY     1998   COLONOSCOPY     ESOPHAGOGASTRODUODENOSCOPY     ESOPHAGOGASTRODUODENOSCOPY [name] 04/13/2016   Procedure: ESOPHAGOGASTRODUODENOSCOPY (EGD);  Surgeon: Iva Boop, MD;  Location: [facility];  Service: Endoscopy;  Laterality: [name];    Social History   Socioeconomic History   Marital status: Married    Spouse name: Not on file   Number of children: 3  Years of education: Not on file   Highest education level: Not on file  Occupational History   Not on file  Tobacco Use   Smoking status: Never    Passive exposure: Never   Smokeless tobacco: Never  Vaping Use   Vaping status: Never Used  Substance and Sexual Activity   Alcohol use: No    Alcohol/week: 0.0 standard drinks of alcohol   Drug use: No   Sexual activity: Yes    Comment: 1st intercourse- 69, partner-1, married- 42 yrs   Other Topics Concern   Not on file  Social History Narrative   Lives with husband and works at [employer]   Social Drivers of Corporate investment banker Strain: Not on file  Food Insecurity: Not on file  Transportation Needs: Not on file  Physical Activity: Not on file  Stress: Not on file  Social Connections: Not on file  Intimate Partner Violence: Not on file    Family History  Problem Relation Age of Onset   Aneurysm Mother    Diabetes Father    Colon polyps Brother    Colon cancer Brother    Liver cancer Brother    Colon cancer Nephew    Esophageal cancer Neg  Hx    Stomach cancer Neg Hx    Pancreatic cancer Neg Hx    Rectal cancer Neg Hx    Breast cancer Neg Hx      Review of Systems  Constitutional:  Positive for fever. Negative for chills and malaise/fatigue.  HENT:  Positive for congestion and sore throat.   Respiratory:  Positive for cough. Negative for sputum production and shortness of breath.   Cardiovascular: Negative.  Negative for chest pain and palpitations.  Gastrointestinal:  Negative for abdominal pain, diarrhea, nausea and vomiting.  Genitourinary: Negative.  Negative for dysuria and hematuria.  Skin:  Negative for rash.  Neurological:  Negative for dizziness and headaches.    Vitals:   03/03/24 0908  BP: 122/78  Pulse: 80  Temp: 98.9 F (37.2 C)  SpO2: 96%    Physical Exam Vitals reviewed.  Constitutional:      Appearance: Normal appearance.  HENT:     Head: Normocephalic.     Mouth/Throat:     Mouth: Mucous membranes are moist.     Pharynx: Oropharynx is clear.  Eyes:     Extraocular Movements: Extraocular movements intact.     Pupils: Pupils are equal, round, and reactive to light.  Cardiovascular:     Rate and Rhythm: Normal rate and regular rhythm.     Pulses: Normal pulses.     Heart sounds: Normal heart sounds.  Pulmonary:     Effort: Pulmonary effort is normal.     Breath sounds: Normal breath sounds.  Musculoskeletal:     Cervical back: No tenderness.  Lymphadenopathy:     Cervical: No cervical adenopathy.  Skin:    General: Skin is warm and dry.     Capillary Refill: Capillary refill takes less than 2 seconds.  Neurological:     Mental Status: Emily [name] is alert and oriented to person, place, and time.  Psychiatric:        Mood and Affect: Mood normal.        Behavior: Behavior normal.      ASSESSMENT & PLAN: A total of 44 minutes was spent with the patient and counseling/coordination of care regarding preparing for this visit, review of most recent office visit notes, review of multiple  chronic  medical conditions and their management, review of most recent emergency department visit notes, review of most recent CT scan of chest report, review of all medications, review of most recent bloodwork results, review of health maintenance items, education on nutrition, prognosis, documentation, and need for follow up.   Problem List Items Addressed This Visit       Cardiovascular and Mediastinum   Essential hypertension   BP Readings from Last 3 Encounters:  03/03/24 122/78  02/14/24 (!) 169/82  10/03/23 130/72  Well-controlled hypertension Continue lisinopril 40 mg and metoprolol succinate 25 mg daily Cardiovascular risks associated with hypertension discussed Diet and nutrition discussed         Respiratory   Viral respiratory infection - Primary   Clinically stable.  Running its course without complications Afebrile.  No pneumonia. Chest x-ray repeated today.  Report reviewed. Symptom management discussed      Relevant Orders   DG Chest 2 View   Chest congestion   Recommend over-the-counter Mucinex DM Chest x-ray today. Recently diagnosed with vascular congestion on chest x-ray raising possibility of CHF Has appointment to see cardiologist in 2 weeks Continue furosemide 20 mg daily      Relevant Orders   DG Chest 2 View     Other   Dyslipidemia   Chronic stable condition Diet and nutrition discussed Has developed allergies to greasy foods Continue rosuvastatin 20 mg daily      Prediabetes   Diet and nutrition discussed Benefits of exercise discussed         Patient Instructions  Infeccin respiratoria viral Viral Respiratory Infection Una infeccin respiratoria viral es una enfermedad que afecta las partes del cuerpo que utilizamos para Industrial/product designer. Estas incluyen los pulmones, la nariz y Administrator. Es causada por un germen llamado virus. Algunos ejemplos de este tipo de infeccin son los siguientes: Un resfro. La gripe (influenza). Una  infeccin por el virus respiratorio sincicial (VRS). Cules son las causas? La causa de esta afeccin es un virus. Se transmite de [place] persona a otra. Puede contraer el virus si: Inhala gotitas de una persona que est enferma. Tiene contacto con personas que estn enfermas. Toca mucosidad u otro lquido de una persona que est enferma. Cules son los signos o sntomas? Los sntomas de esta afeccin incluyen: Secrecin o congestin nasal. Dolor de garganta. Tos. Falta de aire. Dificultad para respirar. Lquido verde o [employer]. Otros sntomas pueden incluir: Fiebre. Sudoracin o escalofros. Cansancio (fatiga). Dolores musculares. Dolor de [place]. Cmo se trata? El tratamiento de esta afeccin puede incluir: Medicamentos para tratar los virus. Medicamentos que facilitan la respiracin. Medicamentos que se pulverizan dentro de la Clinical cytogeneticist. Paracetamol o antiinflamatorios no esteroideos (AINE), como ibuprofeno, para tratar [employer]. Siga estas instrucciones en su casa: Control del dolor y la congestin Use los medicamentos de venta libre y los recetados solamente como se lo haya indicado el mdico. Si le duele la garganta, haga grgaras de agua con sal. Haga esto entre 3 o 4 veces por da, segn sea necesario. Para preparar agua con sal, disuelva de  a 1 cucharadita (de 3 a 6 g) de sal en 1 taza (237 ml) de agua tibia. Asegrese de que se disuelva toda la sal. Use gotas para la nariz hechas con agua salada. Estas ayudan con la secrecin (congestin). Tambin ayudan a Chartered loss adjuster piel alrededor de Architectural technologist. Tome 2 cucharaditas (10 ml) de miel a la hora de acostarse para disminuir la tos por la noche. No  d miel a nios menores de 1 ao. Beba suficiente lquido para Radio producer pis (la orina) de color amarillo plido. Instrucciones generales  Descanse todo lo que pueda. No beba alcohol. No fume ni consuma ningn producto que contenga nicotina o tabaco. Si necesita  ayuda para dejar de fumar, consulte al mdico. Concurra a todas las visitas de seguimiento. Cmo se evita?     Colquese la vacuna antigripal todos los [place]. Pregntele al mdico cundo debe aplicarse la vacuna contra la gripe. No permita que otras personas contraigan sus grmenes. Si est enfermo: Lvese las manos frecuentemente con agua y [place]. Lvese las manos despus de toser o Engineering geologist. Lvese las manos durante al menos 20 segundos. Use un desinfectante para manos si no dispone de [place] y [place]. Cbrase la boca al toser. Cbrase la nariz y la boca cuando estornude. No comparta vasos ni utensilios para comer. Limpie los objetos usados comnmente con frecuencia. Limpie las superficies que se tocan comnmente. Lanny Hurst en su casa y no concurra al Aleen Campi ni a la escuela. Evite el contacto con personas que estn enfermas durante la temporada de resfro y gripe. Esta es en otoo e invierno. Solicite ayuda si: Los sntomas duran [address] o ms. Los sntomas empeoran con [employer]. Repentinamente, siente un dolor muy intenso en el rostro o la frente. Se hinchan mucho algunas partes de la mandbula o del cuello. Le falta el aire. Solicite ayuda de inmediato si: Electronics engineer u opresin en el pecho. Tiene dificultad para respirar. Se siente mareado o como si fuera a desmayarse. Sigue vomitando y [place]. Se siente confundido. Estos sntomas pueden Customer service manager. Solicite ayuda de inmediato. Comunquese con el servicio de emergencias de su localidad (911 en los [place]). No espere a ver si los sntomas desaparecen. No conduzca por sus propios medios [employer] hospital. Resumen Una infeccin respiratoria viral es una enfermedad que afecta las partes del cuerpo que utilizamos para Industrial/product designer. Entre los ejemplos de esta enfermedad, se incluyen el resfro, la gripe y la infeccin por el virus respiratorio sincicial (VRS). La infeccin puede causar secrecin nasal, tos, dolor de  garganta y [place]. Siga las indicaciones del mdico acerca de tomar medicamentos, beber gran cantidad de lquido, lavarse las manos, Lawyer en casa y Automotive engineer el contacto con personas enfermas. Esta informacin no tiene Theme park manager el consejo del mdico. Asegrese de hacerle al mdico cualquier pregunta que tenga. Document Revised: 04/14/2021 Document Reviewed: 04/14/2021 Elsevier Patient Education  2024 Elsevier Inc.    Edwina Barth, MD [facility] at [facility]

## 2024-03-03 NOTE — Assessment & Plan Note (Signed)
 Recommend over-the-counter Mucinex DM Chest x-ray today. Recently diagnosed with vascular congestion on chest x-ray raising possibility of CHF Has appointment to see cardiologist in 2 weeks Continue furosemide 20 mg daily

## 2024-03-03 NOTE — Assessment & Plan Note (Addendum)
 Clinically stable.  Running its course without complications Afebrile.  No pneumonia. Chest x-ray repeated today.  Report reviewed. Symptom management discussed

## 2024-03-06 ENCOUNTER — Telehealth: Payer: Self-pay

## 2024-03-06 NOTE — Telephone Encounter (Signed)
 Copied from CRM [id number]. Topic: Clinical - Lab/Test Results >> Mar 06, 2024  9:39 AM Emily [name] wrote: Reason for CRM: Patient called in wanting to know the results of her xrays, is requesting a callback about this issue

## 2024-03-06 NOTE — Telephone Encounter (Signed)
 Spoke with patient and interpreter she understood xray results and had no further questions

## 2024-03-06 NOTE — Telephone Encounter (Signed)
 Call patient please.  Chest x-ray looks pretty good.  No pneumonia and no fluid collection.  No findings of CHF either.  No concerns based on this x-ray.  Thanks.

## 2024-03-12 ENCOUNTER — Encounter: Payer: Self-pay | Admitting: Emergency Medicine

## 2024-03-12 ENCOUNTER — Ambulatory Visit: Attending: Emergency Medicine | Admitting: Emergency Medicine

## 2024-03-12 VITALS — BP 122/72 | HR 63 | Ht 66.0 in | Wt 186.0 lb

## 2024-03-12 DIAGNOSIS — I2510 Atherosclerotic heart disease of native coronary artery without angina pectoris: Secondary | ICD-10-CM | POA: Diagnosis not present

## 2024-03-12 DIAGNOSIS — I10 Essential (primary) hypertension: Secondary | ICD-10-CM | POA: Diagnosis not present

## 2024-03-12 DIAGNOSIS — R0602 Shortness of breath: Secondary | ICD-10-CM

## 2024-03-12 DIAGNOSIS — R072 Precordial pain: Secondary | ICD-10-CM | POA: Diagnosis not present

## 2024-03-12 DIAGNOSIS — E785 Hyperlipidemia, unspecified: Secondary | ICD-10-CM

## 2024-03-12 LAB — CBC

## 2024-03-12 MED ORDER — FUROSEMIDE 20 MG PO TABS: 20.0000 mg | ORAL_TABLET | Freq: Every day | ORAL | 1 refills | Status: AC

## 2024-03-12 NOTE — Patient Instructions (Addendum)
 Medication Instructions:  NO CHANGES   Lab Work: THS, MAGNESIUM, CBC, CMET, AND LIPID PANEL  Testing/Procedures: Your physician has requested that you have an ECHOCARDIOGRAM. Echocardiography is a painless test that uses sound waves to create images of your heart. It provides your doctor with information about the size and shape of your heart and how well your heart's chambers and valves are working. This procedure takes approximately one hour. There are no restrictions for this procedure. Please do NOT wear cologne, perfume, aftershave, or lotions (deodorant is allowed). Please arrive 15 minutes prior to your appointment time.  Please note: We ask at that you not bring children with you during ultrasound (echo/ vascular) testing. Due to room size and safety concerns, children are not allowed in the ultrasound rooms during exams. Our front office staff cannot provide observation of children in our lobby area while testing is being conducted. An adult accompanying a patient to their appointment will only be allowed in the ultrasound room at the discretion of the ultrasound technician under special circumstances. We apologize for any inconvenience.   Your physician has requested that you have a lexiscan myoview. For further information please visit [email or web address] Please follow instruction sheet, as given.    Follow-Up: At [facility], you and your health needs are our priority.  As part of our continuing mission to provide you with exceptional heart care, we have created designated Provider Care Teams.  These Care Teams include your primary Cardiologist (physician) and Advanced Practice Providers (APPs -  Physician Assistants and Nurse Practitioners) who all work together to provide you with the care you need, when you need it.   Your next appointment:   6 week(s)  Provider:   Rollene Rotunda, MD  or [facility]  Other Instructions:

## 2024-03-12 NOTE — Progress Notes (Signed)
 Cardiology Office Note:    Date:  03/12/2024  ID:  Emily, [name] 18-Nov-1956, MRN [id number] PCP: Georgina Quint, MD  [facility] Providers Cardiologist:  Rollene Rotunda, MD       Patient Profile:      Chief Complaint: F/u ED visit for chest pain   History of Present Illness:  Emily [name] is a 68 y.o. female with visit-pertinent history of hypertension, hyperlipidemia, coronary artery calcification, aortic atherosclerosis, carotid artery disease  She established with cardiology service in 2016 for chest pain.  She had a negative stress test at that time.  She had hypertension but seemed to be sensitive to medication.  She was last seen in office on 11/28/2021.  She was doing well at the time.  She was noted to be previously seen in the ED for hypertensive urgency.  Her blood pressure was well-controlled in clinic, she was told to keep a blood pressure diary and follow-up as needed.  She was recently seen in the ED on 02/14/2024 for chest pains with associated shortness of breath.  Chest x-ray showed no active disease.  High-sensitivity troponins x 2 were negative.  BNP was 97.6.  D-dimer was slightly elevated at 7.04.  CT angio chest PE was ordered showing no pulmonary emboli, mild bilateral lower lobe and minimal bilateral upper lobe dependent groundglass interstitial arthrocentesis, likely due to pulmonary edema, mild cardiomegaly, borderline dilated central pulmonary arteries which can be seen with pulmonary arterial hypertension, coronary artery and aortic atherosclerosis.  She was started on furosemide 20 mg daily.   Discussed the use of AI scribe software for clinical note transcription with the patient, who gave verbal consent to proceed.  Emily [name] is a 68 year old female who presents with chest pain.  She began to experience chest pains beginning on the day of her emergency department visit. The pain was located on the left side of her  chest and sometimes radiated to her back. The chest pain lasted just a few seconds and quickly resolved.  She also notes that her chest pain was accompanied by some mild shortness of breath.  Her shortness of breath has improved however still continues very mildly.  The chest pain had reoccurred for 2 days after her ED visit and has since resolved.  She also noted palpitations that began a week before the emergency visit, initially frequent but had subsided 2 days after her ED visit around the same time her chest pain resolved.   Her LDL cholesterol is elevated at 126 mg/dL, above the target of less than 70 mg/dL. She is prescribed rosuvastatin but has not been taking it consistently for the past three months because she thought her cholesterol level was normal.   Today she is without any exertional angina, dyspnea at rest, leg swelling, orthopnea, PND, lightheadedness, dizziness, syncope, presyncope, claudication.    Review of systems:  Please see the history of present illness. All other systems are reviewed and otherwise negative.     Home Medications:    Current Meds  Medication Sig   acetic acid-hydrocortisone (VOSOL-HC) OTIC solution Place 3 drops into the left ear 3 (three) times daily.   alendronate (FOSAMAX) 70 MG tablet Take 1 tablet (70 mg total) by mouth every 7 (seven) days. Take with a full glass of water on an empty stomach.   ALPRAZolam (XANAX) 0.5 MG tablet TAKE 1 TABLET BY MOUTH EVERY DAY AS NEEDED FOR ANXIETY   Ascorbic Acid (VITAMIN C) 1000  MG tablet Take 1,000 mg by mouth daily.   CALCIUM PO Take 1 tablet by mouth daily.    Cholecalciferol (VITAMIN D3 PO) Take 125 mg by mouth daily.   ciclopirox (PENLAC) 8 % solution Apply topically at bedtime. Apply over nail and surrounding skin. Apply daily over previous coat. After seven (7) days, may remove with alcohol and continue cycle.   Coenzyme Q10 (CO Q-10 PO) Take 18 mg by mouth daily.   esomeprazole (NEXIUM) 40 MG capsule  Take 40 mg by mouth daily at 12 noon.   lisinopril (ZESTRIL) 40 MG tablet TAKE 1 TABLET BY MOUTH EVERY DAY   metoprolol succinate (TOPROL-XL) 25 MG 24 hr tablet Take 1 tablet (25 mg total) by mouth daily.   NIFEdipine (ADALAT CC) 30 MG 24 hr tablet    predniSONE (DELTASONE) 5 MG tablet Take 1 tablet (5 mg total) by mouth 2 (two) times daily with a meal.   rosuvastatin (CRESTOR) 20 MG tablet TAKE 1 TABLET BY MOUTH EVERY DAY   Turmeric (QC TUMERIC COMPLEX PO) Take by mouth daily. NATURAL SUPPLEMENT   [DISCONTINUED] cyclobenzaprine (FLEXERIL) 5 MG tablet    [DISCONTINUED] famotidine (PEPCID) 20 MG tablet Take 20 mg by mouth at bedtime.   [DISCONTINUED] furosemide (LASIX) 20 MG tablet Take 1 tablet (20 mg total) by mouth daily.   [DISCONTINUED] Multiple Vitamin (MULTI-VITAMIN DAILY PO) Take by mouth daily. TAKE 1 TABLET DAILY   Studies Reviewed:       CT angio chest PE 02/14/2024 1. No pulmonary emboli. 2. Mild bilateral lower lobe and minimal bilateral upper lobe dependent ground-glass interstitial opacities. This is most likely due to mild pulmonary edema. 3. Mild cardiomegaly. 4. Borderline dilated central pulmonary arteries, which can be seen with pulmonary arterial hypertension. 5. Moderate-sized hiatal hernia. 6.  Calcific coronary artery and aortic atherosclerosis. Risk Assessment/Calculations:             Physical Exam:   VS:  BP 122/72 (BP Location: Left Arm, Patient Position: Sitting, Cuff Size: Normal)   Pulse 63   Ht 5\' 6"  (1.676 m)   Wt 186 lb (84.4 kg)   BMI 30.02 kg/m    Wt Readings from Last 3 Encounters:  03/12/24 186 lb (84.4 kg)  03/03/24 186 lb (84.4 kg)  02/14/24 185 lb (83.9 kg)    GEN: Well nourished, well developed in no acute distress NECK: No JVD; No carotid bruits CARDIAC: RRR, no murmurs, rubs, gallops RESPIRATORY:  Clear to auscultation without rales, wheezing or rhonchi  ABDOMEN: Soft, non-tender, non-distended EXTREMITIES:  No edema; No acute  deformity      Assessment and Plan:  Chest pain / Shortness of breath  CT angio chest PE 01/2024 showed no PE, mild bilateral lower lobe and minimal bilateral upper lobe dependent groundglass interstitial opacities, likely due to pulmonary edema.  BNP was 97.6.   Repeat chest x-ray 02/2024 showed no active cardiopulmonary disease and no pleural effusion. Prior Myoview 12/2018 showed normal low risk study with mild apical thinning but no ischemia Her chest pain was left-sided, intermittent, lasted few seconds at a time, with radiation to the back.  Chest pain was associated with mild SOB and palpitations.  The CP and palpitations lasted approximately 2 days after her ED visit for total length of 3 days and have since completely resolved.  Her SOB improved however she still has some mild SOB on exertion.  She is without any exertional angina, leg swelling, orthopnea, PND. -Today she is without any  anginal symptoms. She is euvolemic and well compensated on exam.  No CP going on for 3 weeks. -Her EKG today shows no acute ischemic changes -Her symptoms drastically improving with addition of Lasix and noted to have prior G1DD on echocardiogram in 2020 makes her symptoms concerning for possible diastolic heart failure exacerbation -Ordered echocardiogram to reevaluate LV function -Ordered Lexiscan Myoview for further ischemic evaluation -TSH, Mg, CBC, CMET for evaluation of palpitations.  Will defer cardiac monitor at this time -Will refill and continue Lasix at 20 mg daily  Coronary artery calcification / Hyperlipidemia, LDL goal <70 CT angio chest 01/2024 showed calcific coronary artery and aortic atherosclerosis LDL 126, HDL 52, TG 99 on 14/7829 -Lexiscan Myoview ordered today as noted above for further ischemic evaluation -She notes she has been inconsistently taking her rosuvastatin over the past 3 months -Will consider adjusting Crestor dose based after taking consistently for 8 weeks -Consider  repeating during follow-up visit -Continue rosuvastatin 20 mg daily  Hypertension Blood pressure today 122/72 and under excellent control -Continue lisinopril 40 mg daily, metoprolol XL 25 mg daily, nifedipine 30 mg daily         Informed Consent   Shared Decision Making/Informed Consent The risks [chest pain, shortness of breath, cardiac arrhythmias, dizziness, blood pressure fluctuations, myocardial infarction, stroke/transient ischemic attack, nausea, vomiting, allergic reaction, radiation exposure, metallic taste sensation and life-threatening complications (estimated to be 1 in 10,000)], benefits (risk stratification, diagnosing coronary artery disease, treatment guidance) and alternatives of a nuclear stress test were discussed in detail with Ms. Rosezetta Schlatter and she agrees to proceed.     Dispo:  Return in about 6 weeks (around 04/23/2024).  Signed, Denyce Robert, NP

## 2024-03-13 ENCOUNTER — Encounter (HOSPITAL_COMMUNITY): Payer: Self-pay

## 2024-03-13 LAB — CBC
Hematocrit: 39.2 % (ref 34.0–46.6)
Hemoglobin: 12.3 g/dL (ref 11.1–15.9)
MCH: 24.4 pg — ABNORMAL LOW (ref 26.6–33.0)
MCHC: 31.4 g/dL — ABNORMAL LOW (ref 31.5–35.7)
MCV: 78 fL — ABNORMAL LOW (ref 79–97)
Platelets: 298 10*3/uL (ref 150–450)
RBC: 5.05 x10E6/uL (ref 3.77–5.28)
RDW: 14.4 % (ref 11.7–15.4)
WBC: 5.9 10*3/uL (ref 3.4–10.8)

## 2024-03-13 LAB — LIPID PANEL
Chol/HDL Ratio: 3.6 ratio (ref 0.0–4.4)
Cholesterol, Total: 198 mg/dL (ref 100–199)
HDL: 55 mg/dL (ref >39)
LDL Chol Calc (NIH): 128 mg/dL — ABNORMAL HIGH (ref 0–99)
Triglycerides: 85 mg/dL (ref 0–149)
VLDL Cholesterol Cal: 15 mg/dL (ref 5–40)

## 2024-03-13 LAB — COMPREHENSIVE METABOLIC PANEL
ALT: 22 IU/L (ref 0–32)
AST: 19 IU/L (ref 0–40)
Albumin: 4.2 g/dL (ref 3.9–4.9)
Alkaline Phosphatase: 131 IU/L — ABNORMAL HIGH (ref 44–121)
BUN/Creatinine Ratio: 19 (ref 12–28)
BUN: 12 mg/dL (ref 8–27)
Bilirubin Total: 0.3 mg/dL (ref 0.0–1.2)
CO2: 25 mmol/L (ref 20–29)
Calcium: 10.2 mg/dL (ref 8.7–10.3)
Chloride: 106 mmol/L (ref 96–106)
Creatinine, Ser: 0.63 mg/dL (ref 0.57–1.00)
Globulin, Total: 3.2 g/dL (ref 1.5–4.5)
Glucose: 92 mg/dL (ref 70–99)
Potassium: 4.5 mmol/L (ref 3.5–5.2)
Sodium: 142 mmol/L (ref 134–144)
Total Protein: 7.4 g/dL (ref 6.0–8.5)
eGFR: 98 mL/min/{1.73_m2} (ref >59)

## 2024-03-13 LAB — TSH: TSH: 1.12 u[IU]/mL (ref 0.450–4.500)

## 2024-03-13 LAB — MAGNESIUM: Magnesium: 2.4 mg/dL — ABNORMAL HIGH (ref 1.6–2.3)

## 2024-03-16 ENCOUNTER — Telehealth: Payer: Self-pay

## 2024-03-16 NOTE — Telephone Encounter (Addendum)
 Called patient regarding results. Left message for patient to call office.----- Message from [facility] Crystal W sent at 03/13/2024  8:16 AM EDT ----- Results.

## 2024-03-17 ENCOUNTER — Ambulatory Visit (HOSPITAL_COMMUNITY)

## 2024-03-17 NOTE — Addendum Note (Signed)
 Addended by: Raelyn Number on: 03/17/2024 08:36 AM   Modules accepted: Orders

## 2024-03-30 ENCOUNTER — Ambulatory Visit: Admitting: Emergency Medicine

## 2024-04-07 ENCOUNTER — Telehealth: Payer: Self-pay

## 2024-04-07 NOTE — Telephone Encounter (Addendum)
 Letter mailed to patient 04/07/2024----- Message from [facility] Crystal W sent at 03/13/2024  8:16 AM EDT ----- Results.

## 2024-04-09 ENCOUNTER — Ambulatory Visit (HOSPITAL_COMMUNITY): Attending: Cardiology

## 2024-04-09 DIAGNOSIS — I10 Essential (primary) hypertension: Secondary | ICD-10-CM | POA: Insufficient documentation

## 2024-04-09 DIAGNOSIS — R072 Precordial pain: Secondary | ICD-10-CM | POA: Insufficient documentation

## 2024-04-09 DIAGNOSIS — R0602 Shortness of breath: Secondary | ICD-10-CM | POA: Diagnosis present

## 2024-04-09 LAB — ECHOCARDIOGRAM COMPLETE
Area-P 1/2: 2.99 cm2
S' Lateral: 3.2 cm

## 2024-04-22 ENCOUNTER — Encounter: Payer: Self-pay | Admitting: Cardiology

## 2024-04-22 NOTE — Progress Notes (Deleted)
 Cardiology Office Note:   Date:  04/23/2024  ID:  Emily [name], Emily [name] 08-Oct-1956, MRN [id number] PCP: Elvira Hammersmith, MD  [facility] Providers Cardiologist:  Eilleen Grates, MD {  History of Present Illness:   Emily [name] is a 68 y.o. female who was seen for evaluation of chest pain.    Lexiscan was negative for ischemia in Jan 2020.    She had chest pain in Feb 2025.  She had a CT with no PE. There was evidence of coronary calcium .   Echo demonstrated a normal EF of 60 - 65%.  There were no valvular abnormalities.        ***  ROS: As stated in the HPI and negative for all other systems.  Studies Reviewed:    EKG:       ***  Risk Assessment/Calculations:   {Does this patient have ATRIAL FIBRILLATION?:[phone]}          Physical Exam:   VS:  There were no vitals taken for this visit.   Wt Readings from Last 3 Encounters:  04/23/24 180 lb (81.6 kg)  03/12/24 186 lb (84.4 kg)  03/03/24 186 lb (84.4 kg)     GEN: Well nourished, well developed in no acute distress NECK: No JVD; No carotid bruits CARDIAC: ***RR, *** murmurs, rubs, gallops RESPIRATORY:  Clear to auscultation without rales, wheezing or rhonchi  ABDOMEN: Soft, non-tender, non-distended EXTREMITIES:  No edema; No deformity   ASSESSMENT AND PLAN:   Chest pain / Shortness of breath :  ***   CT angio chest PE 01/2024 showed no PE, mild bilateral lower lobe and minimal bilateral upper lobe dependent groundglass interstitial opacities, likely due to pulmonary edema.  BNP was 97.6.   Repeat chest x-ray 02/2024 showed no active cardiopulmonary disease and no pleural effusion. Prior Myoview  12/2018 showed normal low risk study with mild apical thinning but no ischemia Her chest pain was left-sided, intermittent, lasted few seconds at a time, with radiation to the back.  Chest pain was associated with mild SOB and palpitations.  The CP and palpitations lasted approximately 2 days after her  ED visit for total length of 3 days and have since completely resolved.  Her SOB improved however she still has some mild SOB on exertion.  She is without any exertional angina, leg swelling, orthopnea, PND. -Today she is without any anginal symptoms. She is euvolemic and well compensated on exam.  No CP going on for 3 weeks. -Her EKG today shows no acute ischemic changes -Her symptoms drastically improving with addition of Lasix  and noted to have prior G1DD on echocardiogram in 2020 makes her symptoms concerning for possible diastolic heart failure exacerbation -Ordered echocardiogram to reevaluate LV function -Ordered Lexiscan Myoview  for further ischemic evaluation -TSH, Mg, CBC, CMET for evaluation of palpitations.  Will defer cardiac monitor at this time -Will refill and continue Lasix  at 20 mg daily   Coronary artery calcification :  ***  / Hyperlipidemia, LDL goal <70 CT angio chest 01/2024 showed calcific coronary artery and aortic atherosclerosis LDL 126, HDL 52, TG 99 on 47/8295 -Lexiscan Myoview  ordered today as noted above for further ischemic evaluation -She notes she has been inconsistently taking her rosuvastatin  over the past 3 months -Will consider adjusting Crestor  dose based after taking consistently for 8 weeks -Consider repeating during follow-up visit -Continue rosuvastatin  20 mg daily   Hypertension:  *** Blood pressure today 122/72 and under excellent control -Continue lisinopril  40 mg daily, metoprolol   XL 25 mg daily, nifedipine  30 mg daily         Follow up ***  Signed, Eilleen Grates, MD

## 2024-04-23 ENCOUNTER — Encounter: Payer: Self-pay | Admitting: Emergency Medicine

## 2024-04-23 ENCOUNTER — Ambulatory Visit: Admitting: Emergency Medicine

## 2024-04-23 ENCOUNTER — Ambulatory Visit

## 2024-04-23 ENCOUNTER — Ambulatory Visit: Admitting: Cardiology

## 2024-04-23 VITALS — BP 114/74 | HR 69 | Temp 97.9°F | Ht 66.0 in | Wt 180.0 lb

## 2024-04-23 DIAGNOSIS — M25571 Pain in right ankle and joints of right foot: Secondary | ICD-10-CM | POA: Diagnosis not present

## 2024-04-23 DIAGNOSIS — R931 Abnormal findings on diagnostic imaging of heart and coronary circulation: Secondary | ICD-10-CM

## 2024-04-23 DIAGNOSIS — I872 Venous insufficiency (chronic) (peripheral): Secondary | ICD-10-CM | POA: Diagnosis not present

## 2024-04-23 DIAGNOSIS — I10 Essential (primary) hypertension: Secondary | ICD-10-CM

## 2024-04-23 DIAGNOSIS — R072 Precordial pain: Secondary | ICD-10-CM

## 2024-04-23 NOTE — Assessment & Plan Note (Signed)
 Active and contributing to right ankle pain No signs of cellulitis No signs of DVT Leg elevation recommended Pain management discussed Compression socks recommended

## 2024-04-23 NOTE — Patient Instructions (Addendum)
 Please Dolor en el tobillo Ankle Pain La articulacin del tobillo lo ayuda a pararse sobre la pierna y Retail buyer. El dolor en el tobillo puede ocurrir en cualquiera de los lados o en la parte posterior del tobillo. Puede sentir dolor en uno o en ambos tobillos. Puede sentir un dolor agudo que causa sensacin de ardor, o puede ser un dolor sordo y [place]. Puede haber sensibilidad al tacto, rigidez, enrojecimiento o sensacin de calor alrededor del tobillo. El dolor en el tobillo puede tener muchas causas. Estas incluyen una lesin en la zona y el uso excesivo del tobillo. Siga estas indicaciones en su casa: Actividad Ponga el tobillo en reposo como se lo haya indicado el mdico. Evite hacer cosas que le causen dolor en el tobillo. No apoye el peso del cuerpo Intel extremidad [employer] que lo autorice el mdico. Use las [employer] como se lo haya indicado el mdico. Pregntele al mdico cundo puede volver a conducir vehculos si tiene un dispositivo ortopdico en el tobillo. Haga los ejercicios como se lo haya indicado el mdico. Si tiene un dispositivo ortopdico extrable: Use el dispositivo ortopdico como se lo haya indicado el mdico. Quteselo solamente como se lo haya indicado el mdico. Controle todos los das la piel alrededor del dispositivo ortopdico. Informe al mdico acerca de cualquier inquietud. Afljelo si los dedos de los pies se le adormecen, siente hormigueos o se le enfran y se tornan de Research officer, trade union. Mantenga limpio el dispositivo ortopdico. Si el dispositivo ortopdico no es impermeable: No deje que se moje. Cbralo con un envoltorio hermtico cuando tome un bao de inmersin o una ducha. Si tiene una venda elstica:  Qutesela para baarse. Intente no mover mucho el tobillo. Mueva los dedos de los pies de vez en cuando. Esto ayuda a evitar la hinchazn. Acomode la venda si la siente demasiado ajustada. Afloje la venda si siente hormigueos en el pie, o si  el pie se le entumece o si se le enfra y se torna de [employer]. Control del dolor, la rigidez y la hinchazn  Si se lo indican, aplique hielo sobre la zona dolorida. Si tiene un dispositivo ortopdico desmontable o una venda elstica, quteselos como se lo haya indicado el mdico. Ponga el hielo en una bolsa plstica. Coloque una toalla entre la piel y la bolsa. Aplique el hielo durante 20 minutos, 2 a 3 veces por da. Si la piel se le pone de color rojo brillante, retire el hielo de inmediato para evitar daos en la piel. El Felt de dao es mayor si no puede sentir dolor, Airline pilot o fro. Mueva los dedos del pie con frecuencia para reducir la rigidez y la hinchazn. Cuando est sentado o acostado, levante (eleve) el tobillo por encima del nivel del corazn. Indicaciones generales Use los medicamentos de venta libre y los recetados solamente como se lo haya indicado el mdico. Para ayudarlos a usted y a Chief Strategy Officer, anote lo siguiente: Con qu frecuencia le duele el tobillo. Dnde siente el dolor. Cmo se siente el dolor. Si le indican que use un determinado zapato o plantilla, asegrese de usarlo correctamente y durante el tiempo que le indiquen. Comunquese con un mdico si: El Product/process development scientist. El dolor no mejora con medicamentos. Tiene fiebre o escalofros. Tiene ms dificultad para caminar. Aparecen nuevos sntomas. El pie, la pierna, los dedos del pie o el tobillo presentan hormigueos, se le adormecen, se le hinchan, o se le enfran y se tornan de Research officer, trade union.  Esta informacin no tiene Theme park manager el consejo del mdico. Asegrese de hacerle al mdico cualquier pregunta que tenga. Document Revised: 12/19/2022 Document Reviewed: 12/19/2022 Elsevier Patient Education  2024 [employer].

## 2024-04-23 NOTE — Assessment & Plan Note (Signed)
 Multifactorial. Recommend x-ray today.  Will review images when ready. Osteoarthritis contributing as well as chronic venous insufficiency symptoms. Pain management discussed. Unable to take NSAIDs.  Opiates make her very lightheaded and dizzy Recommend to take Tylenol  650 mg as needed for pain

## 2024-04-23 NOTE — Progress Notes (Signed)
 Emily [name] 68 y.o.   Chief Complaint  Patient presents with   Leg Pain    Patient states it has been hurting for 4 days. The pain is around her right ankle area.     HISTORY OF PRESENT ILLNESS: This is a 68 y.o. female complaining of pain to the right ankle area for the past several days Patient has history of osteoarthritis and chronic venous insufficiency Tylenol  helps with the pain. No other complaints or medical concerns today.   Leg Pain      Prior to Admission medications   Medication Sig Start Date End Date Taking? Authorizing Provider  acetic acid -hydrocortisone  (VOSOL -HC) OTIC solution Place 3 drops into the left ear 3 (three) times daily. 05/21/23  Yes Wendal Wilkie, Isidro Margo, MD  alendronate  (FOSAMAX ) 70 MG tablet Take 1 tablet (70 mg total) by mouth every 7 (seven) days. Take with a full glass of water on an empty stomach. 12/10/23  Yes Erlene Devita, Isidro Margo, MD  ALPRAZolam  (XANAX ) 0.5 MG tablet TAKE 1 TABLET BY MOUTH EVERY DAY AS NEEDED FOR ANXIETY 08/27/23  Yes Renaud Celli, Isidro Margo, MD  Ascorbic Acid (VITAMIN C) 1000 MG tablet Take 1,000 mg by mouth daily.   Yes [provider]  CALCIUM  PO Take 1 tablet by mouth daily.    Yes [provider]  Cholecalciferol (VITAMIN D3 PO) Take 125 mg by mouth daily.   Yes [provider]  ciclopirox  (PENLAC ) 8 % solution Apply topically at bedtime. Apply over nail and surrounding skin. Apply daily over previous coat. After seven (7) days, may remove with alcohol and continue cycle. 11/12/22  Yes McDonald, Olive Better, DPM  Coenzyme Q10 (CO Q-10 PO) Take 18 mg by mouth daily.   Yes [provider]  esomeprazole  (NEXIUM ) 40 MG capsule Take 40 mg by mouth daily at 12 noon.   Yes [provider]  furosemide  (LASIX ) 20 MG tablet Take 1 tablet (20 mg total) by mouth daily. 03/12/24  Yes Fountain, Madison L, NP  lisinopril  (ZESTRIL ) 40 MG tablet TAKE 1 TABLET BY MOUTH EVERY DAY 02/28/23  Yes  Carmina Walle, Isidro Margo, MD  metoprolol  succinate (TOPROL -XL) 25 MG 24 hr tablet Take 1 tablet (25 mg total) by mouth daily. 10/03/23  Yes Kileen Lange, Isidro Margo, MD  NIFEdipine  (ADALAT  CC) 30 MG 24 hr tablet    Yes [provider]  predniSONE  (DELTASONE ) 5 MG tablet Take 1 tablet (5 mg total) by mouth 2 (two) times daily with a meal. 09/13/23  Yes Adah Acron, MD  rosuvastatin  (CRESTOR ) 20 MG tablet TAKE 1 TABLET BY MOUTH EVERY DAY 08/25/23  Yes Kaydee Magel, Isidro Margo, MD  Turmeric (QC TUMERIC COMPLEX PO) Take by mouth daily. NATURAL SUPPLEMENT   Yes [provider]    Allergies  Allergen Reactions   Ibuprofen    Aspirin Hives   Atorvastatin      Yellow fingernails   Nsaids Hives    Patient Active Problem List   Diagnosis Date Noted   Age-related osteoporosis without current pathological fracture 12/10/2023   Onychomycosis 10/30/2022   Prediabetes 02/12/2022   Hypertensive urgency 11/28/2021   History of arthritis 08/15/2020   History of gastroesophageal reflux (GERD) 08/15/2020   Carotid artery disease (HCC) 12/19/2018   Bilateral carotid artery stenosis, R- 40-59%, L 1-39% 11/2017 03/25/2018   Dyslipidemia 03/25/2018   GERD (gastroesophageal reflux disease) 04/12/2016   Chronic high back pain 09/24/2015   Essential hypertension 10/03/2012   Hyperlipidemia 10/03/2012   DYSPEPSIA&OTHER SPEC  DISORDERS FUNCTION STOMACH 07/20/2009    Past Medical History:  Diagnosis Date   Abdominal pain, chronic, epigastric 11/02/2015   Started amitriptyline  at hospitalization 04/13/2016 EGD x 2 negative Also w/ chronic chest wall and back pain Would try not to work up further    Anxiety    Arthritis    BACK,KNEES   Beta thalassemia minor    diagnosed by hematology Dr. Wynona Hedger, also with sickle cell trait   Blood transfusion without reported diagnosis    "LONG TIME AGO"   Chronic high back pain 09/24/2015   Colon polyps    GERD (gastroesophageal reflux disease)     Hyperlipidemia    Hypertension    Sickle cell trait (HCC)    diagnosed by hematology Dr. Lee Public, coexsisting with beta-thalassemia minor    Past Surgical History:  Procedure Laterality Date   ABDOMINAL HYSTERECTOMY     1998   COLONOSCOPY     ESOPHAGOGASTRODUODENOSCOPY     ESOPHAGOGASTRODUODENOSCOPY [name] 04/13/2016   Procedure: ESOPHAGOGASTRODUODENOSCOPY (EGD);  Surgeon: Kenney Peacemaker, MD;  Location: [facility];  Service: Endoscopy;  Laterality: [name];    Social History   Socioeconomic History   Marital status: Married    Spouse name: Not on file   Number of children: 3   Years of education: Not on file   Highest education level: Not on file  Occupational History   Not on file  Tobacco Use   Smoking status: Never    Passive exposure: Never   Smokeless tobacco: Never  Vaping Use   Vaping status: Never Used  Substance and Sexual Activity   Alcohol use: No    Alcohol/week: 0.0 standard drinks of alcohol   Drug use: No   Sexual activity: Yes    Comment: 1st intercourse- 63, partner-1, married- 42 yrs   Other Topics Concern   Not on file  Social History Narrative   Lives with husband and works at [employer]   Social Drivers of Corporate investment banker Strain: Not on file  Food Insecurity: Not on file  Transportation Needs: Not on file  Physical Activity: Not on file  Stress: Not on file  Social Connections: Not on file  Intimate Partner Violence: Not on file    Family History  Problem Relation Age of Onset   Aneurysm Mother    Diabetes Father    Colon polyps Brother    Colon cancer Brother    Liver cancer Brother    Colon cancer Nephew    Esophageal cancer Neg Hx    Stomach cancer Neg Hx    Pancreatic cancer Neg Hx    Rectal cancer Neg Hx    Breast cancer Neg Hx      Review of Systems  Constitutional: Negative.  Negative for chills and fever.  HENT: Negative.  Negative for congestion and sore throat.   Respiratory: Negative.  Negative for cough and  shortness of breath.   Cardiovascular: Negative.  Negative for chest pain and palpitations.  Gastrointestinal:  Negative for abdominal pain, diarrhea, nausea and vomiting.  Genitourinary: Negative.  Negative for dysuria and hematuria.  Musculoskeletal:  Positive for joint pain.  Skin: Negative.  Negative for rash.  Neurological:  Negative for dizziness and headaches.  All other systems reviewed and are negative.   Vitals:   04/23/24 1052  BP: 114/74  Pulse: 69  Temp: 97.9 F (36.6 C)  SpO2: 96%    Physical Exam Vitals reviewed.  Constitutional:  Appearance: Normal appearance.  HENT:     Head: Normocephalic.  Eyes:     Extraocular Movements: Extraocular movements intact.  Cardiovascular:     Rate and Rhythm: Normal rate.     Pulses: Normal pulses.  Pulmonary:     Effort: Pulmonary effort is normal.  Musculoskeletal:     Cervical back: No tenderness.     Comments: Right lower extremity: Varicosities noted.  No significant edema.  No signs of cellulitis. Tender right ankle with mild swelling.  Excellent peripheral pulses and capillary refill.  Good distal sensation.  Lymphadenopathy:     Cervical: No cervical adenopathy.  Skin:    General: Skin is warm and dry.     Capillary Refill: Capillary refill takes less than 2 seconds.  Neurological:     General: No focal deficit present.     Mental Status: She is alert and oriented to person, place, and time.  Psychiatric:        Mood and Affect: Mood normal.        Behavior: Behavior normal.    DG Ankle Complete Right Result Date: 04/23/2024 CLINICAL DATA:  Right ankle pain. EXAM: RIGHT ANKLE - COMPLETE 3+ VIEW COMPARISON:  None Available. FINDINGS: There is diffuse osteopenia of the visualized osseous structures. No acute fracture or dislocation. No aggressive osseous lesion. Ankle mortise appears intact. Mild diffuse arthritis of imaged joints. Calcaneal spur noted along the Plantar aponeurosis attachment site. No focal  soft tissue swelling. No radiopaque foreign bodies. IMPRESSION: No acute osseous abnormality of the right ankle joint. Electronically Signed   By: Beula Brunswick M.D.   On: 04/23/2024 11:26     ASSESSMENT & PLAN: A total of 30 minutes was spent with the patient and counseling/coordination of care regarding preparing for this visit, review of most recent office visit notes, review of multiple chronic medical conditions under management, review of all medications, differential diagnosis of right ankle pain, pain management, review of x-ray images done today, prognosis, documentation, and need for follow-up.  Problem List Items Addressed This Visit       Cardiovascular and Mediastinum   Chronic venous insufficiency   Active and contributing to right ankle pain No signs of cellulitis No signs of DVT Leg elevation recommended Pain management discussed Compression socks recommended        Other   Acute right ankle pain - Primary   Multifactorial. Recommend x-ray today.  Will review images when ready. Osteoarthritis contributing as well as chronic venous insufficiency symptoms. Pain management discussed. Unable to take NSAIDs.  Opiates make her very lightheaded and dizzy Recommend to take Tylenol  650 mg as needed for pain      Relevant Orders   DG Ankle Complete Right (Completed)   Patient Instructions  Please Dolor en el tobillo Ankle Pain La articulacin del tobillo lo ayuda a pararse sobre la pierna y Retail buyer. El dolor en el tobillo puede ocurrir en cualquiera de los lados o en la parte posterior del tobillo. Puede sentir dolor en uno o en ambos tobillos. Puede sentir un dolor agudo que causa sensacin de ardor, o puede ser un dolor sordo y [place]. Puede haber sensibilidad al tacto, rigidez, enrojecimiento o sensacin de calor alrededor del tobillo. El dolor en el tobillo puede tener muchas causas. Estas incluyen una lesin en la zona y el uso excesivo del  tobillo. Siga estas indicaciones en su casa: Actividad Ponga el tobillo en reposo como se lo haya indicado el mdico. Brewing technologist  cosas que le causen dolor en el tobillo. No apoye el peso del cuerpo Intel extremidad [employer] que lo autorice el mdico. Use las [employer] como se lo haya indicado el mdico. Pregntele al mdico cundo puede volver a conducir vehculos si tiene un dispositivo ortopdico en el tobillo. Haga los ejercicios como se lo haya indicado el mdico. Si tiene un dispositivo ortopdico extrable: Use el dispositivo ortopdico como se lo haya indicado el mdico. Quteselo solamente como se lo haya indicado el mdico. Controle todos los das la piel alrededor del dispositivo ortopdico. Informe al mdico acerca de cualquier inquietud. Afljelo si los dedos de los pies se le adormecen, siente hormigueos o se le enfran y se tornan de Research officer, trade union. Mantenga limpio el dispositivo ortopdico. Si el dispositivo ortopdico no es impermeable: No deje que se moje. Cbralo con un envoltorio hermtico cuando tome un bao de inmersin o una ducha. Si tiene una venda elstica:  Qutesela para baarse. Intente no mover mucho el tobillo. Mueva los dedos de los pies de vez en cuando. Esto ayuda a evitar la hinchazn. Acomode la venda si la siente demasiado ajustada. Afloje la venda si siente hormigueos en el pie, o si el pie se le entumece o si se le enfra y se torna de [employer]. Control del dolor, la rigidez y la hinchazn  Si se lo indican, aplique hielo sobre la zona dolorida. Si tiene un dispositivo ortopdico desmontable o una venda elstica, quteselos como se lo haya indicado el mdico. Ponga el hielo en una bolsa plstica. Coloque una toalla entre la piel y la bolsa. Aplique el hielo durante 20 minutos, 2 a 3 veces por da. Si la piel se le pone de color rojo brillante, retire el hielo de inmediato para evitar daos en la piel. El [place] de dao es mayor si no puede  sentir dolor, Airline pilot o fro. Mueva los dedos del pie con frecuencia para reducir la rigidez y la hinchazn. Cuando est sentado o acostado, levante (eleve) el tobillo por encima del nivel del corazn. Indicaciones generales Use los medicamentos de venta libre y los recetados solamente como se lo haya indicado el mdico. Para ayudarlos a usted y a Chief Strategy Officer, anote lo siguiente: Con qu frecuencia le duele el tobillo. Dnde siente el dolor. Cmo se siente el dolor. Si le indican que use un determinado zapato o plantilla, asegrese de usarlo correctamente y durante el tiempo que le indiquen. Comunquese con un mdico si: El Product/process development scientist. El dolor no mejora con medicamentos. Tiene fiebre o escalofros. Tiene ms dificultad para caminar. Aparecen nuevos sntomas. El pie, la pierna, los dedos del pie o el tobillo presentan hormigueos, se le adormecen, se le hinchan, o se le enfran y se tornan de Research officer, trade union. Esta informacin no tiene Theme park manager el consejo del mdico. Asegrese de hacerle al mdico cualquier pregunta que tenga. Document Revised: 12/19/2022 Document Reviewed: 12/19/2022 Elsevier Patient Education  2024 Elsevier Inc.    Maryagnes Small, MD [facility]

## 2024-05-09 ENCOUNTER — Other Ambulatory Visit: Payer: Self-pay | Admitting: Emergency Medicine

## 2024-05-09 DIAGNOSIS — I10 Essential (primary) hypertension: Secondary | ICD-10-CM

## 2024-06-03 DIAGNOSIS — I2510 Atherosclerotic heart disease of native coronary artery without angina pectoris: Secondary | ICD-10-CM | POA: Insufficient documentation

## 2024-06-03 DIAGNOSIS — R072 Precordial pain: Secondary | ICD-10-CM | POA: Insufficient documentation

## 2024-06-03 NOTE — Progress Notes (Signed)
 Cardiology Office Note:   Date:  06/04/2024  ID:  [name], [name] 1956-09-17, MRN [id number] PCP: Elvira Hammersmith, MD  [facility] Providers Cardiologist:  Eilleen Grates, MD {  History of Present Illness:   Emily [name] is a 68 y.o. female with chest pain.  I have not seen her since 2018.  She was seen in the office in March 2025.  She had chest pain and SOB.  She had a CT in Feb without PE.  Echo in April demonstrated a normal EF.   There were no significant valvular abnormalities.  She did have coronary artery  calcium .   She was going to have perfusion study.  However, does not look like this happened.  She did have some improvement in her shortness of breath noted after taking Lasix .  She is thought to have some diastolic dysfunction.  She presents for follow-up.  Her insurance would not approve the stress test that was ordered.  However, since her presentation she has not had any further symptoms.  She denies any ongoing chest discomfort, neck or arm discomfort.  She has had no new shortness of breath, PND or orthopnea.  She has had no palpitations other than an event yesterday when she got lightheaded with a lower blood pressure and heart rate went up.  She actually did some salt loading and some hydration and she felt better.  She has not had that since then or before that.  She has a very physical job she says and she is not able to bring on any symptoms.  ROS: As stated in the HPI and negative for all other systems.  Studies Reviewed:    EKG:   EKG Interpretation Date/Time:  Thursday June 04 2024 14:12:22 EDT Ventricular Rate:  70 PR Interval:  206 QRS Duration:  82 QT Interval:  394 QTC Calculation: 425 R Axis:   -4  Text Interpretation: Normal sinus rhythm Poor anterior R wave progression. When compared with ECG of 12-Mar-2024 08:30, No significant change was found Confirmed by Eilleen Grates ([id number]) on 06/04/2024 2:15:31 PM    Risk  Assessment/Calculations:              Physical Exam:   VS:  BP 126/86   Pulse 70   Ht 5' 6 (1.676 m)   Wt 182 lb 12.8 oz (82.9 kg)   SpO2 97%   BMI 29.50 kg/m    Wt Readings from Last 3 Encounters:  06/04/24 182 lb 12.8 oz (82.9 kg)  04/23/24 180 lb (81.6 kg)  03/12/24 186 lb (84.4 kg)     GEN: Well nourished, well developed in no acute distress NECK: No JVD; No carotid bruits CARDIAC: RRR, no murmurs, rubs, gallops RESPIRATORY:  Clear to auscultation without rales, wheezing or rhonchi  ABDOMEN: Soft, non-tender, non-distended EXTREMITIES:  No edema; No deformity   ASSESSMENT AND PLAN:   Chest pain / Shortness of breath : Patient is no longer having any chest pain or shortness of breath.  She has about continuing to take Lasix  and I said she could try this as needed.  She does have some coronary calcium  and we had a long conversation about this.  In the absence of any further symptoms then I do not think further testing is indicated particularly since she has a very active lifestyle.  However, if she has chest discomfort or shortness of breath in the future she will let me know and we will do further testing.  We talked at great length about primary risk reduction to include diet and exercise.  Coronary artery calcification : As above no further testing but we will pursue aggressive risk reduction.  Dyslipidemia: I will increase her Crestor  to 40 mg daily.  Her LDL was 128 and I think the goal should be at least in the 70s.  We will check a lipid profile in 3 months.    Hypertension: Her blood pressure is at target.  No change in therapy.   Follow up with me in 1 year  Signed, Eilleen Grates, MD

## 2024-06-04 ENCOUNTER — Ambulatory Visit: Attending: Cardiology | Admitting: Cardiology

## 2024-06-04 ENCOUNTER — Encounter: Payer: Self-pay | Admitting: Cardiology

## 2024-06-04 VITALS — BP 126/86 | HR 70 | Ht 66.0 in | Wt 182.8 lb

## 2024-06-04 DIAGNOSIS — E785 Hyperlipidemia, unspecified: Secondary | ICD-10-CM | POA: Diagnosis not present

## 2024-06-04 DIAGNOSIS — R072 Precordial pain: Secondary | ICD-10-CM | POA: Diagnosis not present

## 2024-06-04 DIAGNOSIS — I10 Essential (primary) hypertension: Secondary | ICD-10-CM

## 2024-06-04 DIAGNOSIS — I2510 Atherosclerotic heart disease of native coronary artery without angina pectoris: Secondary | ICD-10-CM | POA: Diagnosis not present

## 2024-06-04 MED ORDER — ROSUVASTATIN CALCIUM 40 MG PO TABS: 40.0000 mg | ORAL_TABLET | Freq: Every day | ORAL | 3 refills | Status: DC

## 2024-06-04 NOTE — Patient Instructions (Addendum)
 Medication Instructions:  Increase Crestor  to 40 mg once daily  *If you need a refill on your cardiac medications before your next appointment, please call your pharmacy*  Lab Work: Fasting lipid panel in 3 months If you have labs (blood work) drawn today and your tests are completely normal, you will receive your results only by: MyChart Message (if you have MyChart) OR A paper copy in the mail If you have any lab test that is abnormal or we need to change your treatment, we will call you to review the results.  Testing/Procedures: NONE  Follow-Up: At [facility], you and your health needs are our priority.  As part of our continuing mission to provide you with exceptional heart care, our providers are all part of one team.  This team includes your primary Cardiologist (physician) and Advanced Practice Providers or APPs (Physician Assistants and Nurse Practitioners) who all work together to provide you with the care you need, when you need it.  Your next appointment:   1 year  Provider:   Lavonne Prairie, MD  We recommend signing up for the patient portal called MyChart.  Sign up information is provided on this After Visit Summary.  MyChart is used to connect with patients for Virtual Visits (Telemedicine).  Patients are able to view lab/test results, encounter notes, upcoming appointments, etc.  Non-urgent messages can be sent to your provider as well.   To learn more about what you can do with MyChart, go to [email or web address].

## 2024-07-24 ENCOUNTER — Ambulatory Visit: Payer: Self-pay

## 2024-07-24 NOTE — Telephone Encounter (Signed)
 FYI Only or Action Required?: FYI only for provider.  Patient was last seen in primary care on 04/23/2024 by Purcell Emil Schanz, MD.  Called Nurse Triage reporting Pain.  Symptoms began several days ago.  Interventions attempted: OTC medications: tylenol  .  Symptoms are: unchanged.  Triage Disposition: See PCP When Office is Open (Within 3 Days)  Patient/caregiver understands and will follow disposition?: Yes   Using [facility] ID [phone].       Copied from CRM [id number]. Topic: Clinical - Red Word Triage >> Jul 24, 2024  3:44 PM Deleta RAMAN wrote: Red Word that prompted transfer to Nurse Triage: patient has pain in her arm , leg, and back. As well as hemoglobin is low was told by cardiologist Reason for Disposition  [1] MODERATE pain (e.g., interferes with normal activities) AND [2] present > 3 days  Answer Assessment - Initial Assessment Questions 1. ONSET: When did the muscle aches or body pains start?      A couple days 2. LOCATION: What part of your body is hurting? (e.g., entire body, arms, legs)      Arms, legs and back- entire body 3. SEVERITY: How bad is the pain? (Scale 1-10; or mild, moderate, severe)     8/10 but just got home from work 4. CAUSE: What do you think is causing the pains?     unknown 5. FEVER: Do you have a fever? If Yes, ask: What is your temperature, how was it measured, and  when did it start?      no 6. OTHER SYMPTOMS: Do you have any other symptoms? (e.g., chest pain, cold or flu symptoms, rash, weakness, weight loss)     Tired all the times 8. TRAVEL: Have you traveled out of the country in the last month? (e.g., exposures, travel history)     no  Protocols used: Muscle Aches and Body Pain-A-AH

## 2024-07-27 ENCOUNTER — Ambulatory Visit: Admitting: Emergency Medicine

## 2024-08-03 ENCOUNTER — Ambulatory Visit: Admitting: Emergency Medicine

## 2024-08-18 ENCOUNTER — Encounter: Payer: Self-pay | Admitting: Emergency Medicine

## 2024-08-18 ENCOUNTER — Ambulatory Visit: Admitting: Emergency Medicine

## 2024-08-18 VITALS — BP 124/78 | HR 57 | Temp 98.8°F | Ht 66.0 in | Wt 184.0 lb

## 2024-08-18 DIAGNOSIS — M549 Dorsalgia, unspecified: Secondary | ICD-10-CM

## 2024-08-18 DIAGNOSIS — M810 Age-related osteoporosis without current pathological fracture: Secondary | ICD-10-CM

## 2024-08-18 DIAGNOSIS — R7303 Prediabetes: Secondary | ICD-10-CM

## 2024-08-18 DIAGNOSIS — K219 Gastro-esophageal reflux disease without esophagitis: Secondary | ICD-10-CM

## 2024-08-18 DIAGNOSIS — L659 Nonscarring hair loss, unspecified: Secondary | ICD-10-CM | POA: Insufficient documentation

## 2024-08-18 DIAGNOSIS — G894 Chronic pain syndrome: Secondary | ICD-10-CM | POA: Diagnosis not present

## 2024-08-18 DIAGNOSIS — E785 Hyperlipidemia, unspecified: Secondary | ICD-10-CM

## 2024-08-18 DIAGNOSIS — I10 Essential (primary) hypertension: Secondary | ICD-10-CM

## 2024-08-18 DIAGNOSIS — G8929 Other chronic pain: Secondary | ICD-10-CM

## 2024-08-18 DIAGNOSIS — D649 Anemia, unspecified: Secondary | ICD-10-CM | POA: Diagnosis not present

## 2024-08-18 LAB — COMPREHENSIVE METABOLIC PANEL WITH GFR
ALT: 24 U/L (ref 0–35)
AST: 21 U/L (ref 0–37)
Albumin: 4.1 g/dL (ref 3.5–5.2)
Alkaline Phosphatase: 98 U/L (ref 39–117)
BUN: 15 mg/dL (ref 6–23)
CO2: 30 meq/L (ref 19–32)
Calcium: 9.8 mg/dL (ref 8.4–10.5)
Chloride: 103 meq/L (ref 96–112)
Creatinine, Ser: 0.59 mg/dL (ref 0.40–1.20)
GFR: 93.57 mL/min (ref >60.00)
Glucose, Bld: 100 mg/dL — ABNORMAL HIGH (ref 70–99)
Potassium: 3.8 meq/L (ref 3.5–5.1)
Sodium: 141 meq/L (ref 135–145)
Total Bilirubin: 0.3 mg/dL (ref 0.2–1.2)
Total Protein: 7.7 g/dL (ref 6.0–8.3)

## 2024-08-18 LAB — HEMOGLOBIN A1C: Hgb A1c MFr Bld: 6.2 % (ref 4.6–6.5)

## 2024-08-18 LAB — LIPID PANEL
Cholesterol: 168 mg/dL (ref 0–200)
HDL: 53.6 mg/dL (ref >39.00)
LDL Cholesterol: 86 mg/dL (ref 0–99)
NonHDL: 113.94
Total CHOL/HDL Ratio: 3
Triglycerides: 142 mg/dL (ref 0.0–149.0)
VLDL: 28.4 mg/dL (ref 0.0–40.0)

## 2024-08-18 LAB — FERRITIN: Ferritin: 18 ng/mL (ref 10.0–291.0)

## 2024-08-18 LAB — CBC WITH DIFFERENTIAL/PLATELET
Basophils Absolute: 0.1 K/uL (ref 0.0–0.1)
Basophils Relative: 1.1 % (ref 0.0–3.0)
Eosinophils Absolute: 0.3 K/uL (ref 0.0–0.7)
Eosinophils Relative: 4.7 % (ref 0.0–5.0)
HCT: 39 % (ref 36.0–46.0)
Hemoglobin: 12.6 g/dL (ref 12.0–15.0)
Lymphocytes Relative: 31.6 % (ref 12.0–46.0)
Lymphs Abs: 2 K/uL (ref 0.7–4.0)
MCHC: 32.2 g/dL (ref 30.0–36.0)
MCV: 75.2 fl — ABNORMAL LOW (ref 78.0–100.0)
Monocytes Absolute: 0.5 K/uL (ref 0.1–1.0)
Monocytes Relative: 7.7 % (ref 3.0–12.0)
Neutro Abs: 3.5 K/uL (ref 1.4–7.7)
Neutrophils Relative %: 54.9 % (ref 43.0–77.0)
Platelets: 277 K/uL (ref 150.0–400.0)
RBC: 5.19 Mil/uL — ABNORMAL HIGH (ref 3.87–5.11)
RDW: 15.4 % (ref 11.5–15.5)
WBC: 6.4 K/uL (ref 4.0–10.5)

## 2024-08-18 LAB — VITAMIN B12: Vitamin B-12: >1500 pg/mL — ABNORMAL HIGH (ref 211–911)

## 2024-08-18 LAB — VITAMIN D 25 HYDROXY (VIT D DEFICIENCY, FRACTURES): VITD: 38.89 ng/mL (ref 30.00–100.00)

## 2024-08-18 LAB — SEDIMENTATION RATE: Sed Rate: 26 mm/h (ref 0–30)

## 2024-08-18 LAB — TSH: TSH: 1.09 u[IU]/mL (ref 0.35–5.50)

## 2024-08-18 LAB — FOLATE: Folate: 16.8 ng/mL (ref >5.9)

## 2024-08-18 MED ORDER — ROSUVASTATIN CALCIUM 40 MG PO TABS: 40.0000 mg | ORAL_TABLET | Freq: Every day | ORAL | 3 refills | Status: AC

## 2024-08-18 MED ORDER — DULOXETINE HCL 30 MG PO CPEP: 30.0000 mg | ORAL_CAPSULE | Freq: Every day | ORAL | 3 refills | Status: DC

## 2024-08-18 MED ORDER — LISINOPRIL 40 MG PO TABS: 40.0000 mg | ORAL_TABLET | Freq: Every day | ORAL | 3 refills | Status: AC

## 2024-08-18 MED ORDER — METOPROLOL SUCCINATE ER 25 MG PO TB24: 25.0000 mg | ORAL_TABLET | Freq: Every day | ORAL | 3 refills | Status: AC

## 2024-08-18 NOTE — Assessment & Plan Note (Signed)
 Stable and asymptomatic

## 2024-08-18 NOTE — Assessment & Plan Note (Signed)
 Chronic stable condition Diet and nutrition discussed Has developed allergies to greasy foods Continue rosuvastatin 20 mg daily

## 2024-08-18 NOTE — Assessment & Plan Note (Signed)
 Chronic pain.  Old injury. Recommend Tylenol  and or Advil for pain as needed May need orthopedic referral

## 2024-08-18 NOTE — Assessment & Plan Note (Signed)
Diet and nutrition discussed Benefits of exercise discussed

## 2024-08-18 NOTE — Assessment & Plan Note (Signed)
 Neuropathic pain component to it. Recommend trial of Cymbalta  30 mg daily for 30 days May need referral to pain management clinic Diffuse soft tissue tenderness on physical examination No active synovitis on joints May have fibromyalgia

## 2024-08-18 NOTE — Patient Instructions (Signed)
 Mantenimiento de la salud despus de los 65 aos de edad Health Maintenance After Age 67 Despus de los 65 aos de edad, corre un riesgo mayor de Film/video editor enfermedades e infecciones a largo plazo, como tambin de sufrir lesiones por cadas. Las cadas son la causa principal de las fracturas de huesos y lesiones en la cabeza de personas mayores de 65 aos de edad. Recibir cuidados preventivos de forma regular puede ayudarlo a mantenerse saludable y en buen [place]. Los cuidados preventivos incluyen realizarse anlisis de forma regular y Forensic psychologist en el estilo de vida segn las recomendaciones del mdico. Converse con el mdico sobre lo siguiente: Las pruebas de deteccin y los anlisis que debe International aid/development worker. Una prueba de deteccin es un estudio que se para Engineer, manufacturing la presencia de una enfermedad cuando no tiene sntomas. Un plan de dieta y ejercicios adecuado para usted. Qu debo saber sobre las pruebas de deteccin y los anlisis para prevenir cadas? Realizarse pruebas de deteccin y [employer] es la mejor manera de Engineer, manufacturing un problema de salud de forma temprana. El diagnstico y tratamiento tempranos le brindan la mejor oportunidad de Chief Operating Officer las afecciones mdicas que son comunes despus de los 65 aos de edad. Ciertas afecciones y elecciones de estilo de vida pueden hacer que sea ms propenso a sufrir Engineer, site. El mdico puede recomendarle lo siguiente: Controles regulares de la visin. Una visin deficiente y afecciones como las cataratas pueden hacer que sea ms propenso a sufrir Engineer, site. Si usa  lentes, asegrese de obtener una receta actualizada si su visin cambia. Revisin de medicamentos. Revise regularmente con el mdico todos los medicamentos que toma, incluidos los medicamentos de [place]. Consulte al [employer] efectos secundarios que pueden hacer que sea ms propenso a sufrir Engineer, site. Informe al mdico si alguno de los medicamentos que toma lo hace sentir mareado o  somnoliento. Controles de fuerza y equilibrio. El mdico puede recomendar ciertos estudios para controlar su fuerza y equilibrio al estar de pie, al caminar o al cambiar de posicin. Examen de los pies. El dolor y Materials engineer en los pies, como tambin no utilizar el calzado adecuado, pueden hacer que sea ms propenso a sufrir Engineer, site. Pruebas de deteccin, que incluyen las siguientes: Pruebas de deteccin para la osteoporosis. La osteoporosis es una afeccin que hace que los huesos se tornen ms dbiles y se quiebren con ms facilidad. Pruebas de deteccin para la presin arterial. Los cambios en la presin arterial y los medicamentos para Chief Operating Officer la presin arterial pueden hacerlo sentir mareado. Prueba de deteccin de la depresin. Es ms probable que sufra una cada si tiene miedo a caerse, se siente deprimido o se siente incapaz de Probation officer. Prueba de deteccin de consumo de alcohol. Beber demasiado alcohol puede afectar su equilibrio y puede hacer que sea ms propenso a sufrir Engineer, site. Siga estas indicaciones en su casa: Estilo de vida No beba alcohol si: Su mdico le indica no hacerlo. Si bebe alcohol: Limite la cantidad que bebe a lo siguiente: De 0 a 1 medida por da para las mujeres. De 0 a 2 medidas por da para los hombres. Sepa cunta cantidad de alcohol hay en las bebidas que toma. En los [address], una medida equivale a una botella de cerveza de 12 oz (355 ml), un vaso de vino de 5 oz (148 ml) o un vaso de una bebida alcohlica de alta graduacin de 1 oz (44 ml). No consuma ningn producto que  contenga nicotina o tabaco. Estos productos incluyen cigarrillos, tabaco para mascar y aparatos de vapeo, como los cigarrillos electrnicos. Si necesita ayuda para dejar de consumir estos productos, consulte al [employer]. Actividad  Siga un programa de ejercicio regular para mantenerse en forma. Esto lo ayudar a Radio producer equilibrio. Consulte al  mdico qu tipos de ejercicios son adecuados para usted. Si necesita un bastn o un andador, selo segn las recomendaciones del mdico. Utilice calzado con buen apoyo y suela antideslizante. Seguridad  Retire los [employer] puedan causar tropiezos tales como alfombras, cables u obstculos. Instale equipos de seguridad, como barras para sostn en los baos y barandas de seguridad en las escaleras. Mantenga las habitaciones y los pasillos bien iluminados. Indicaciones generales Hable con el mdico sobre sus riesgos de sufrir una cada. Infrmele a su mdico si: Se cae. Asegrese de informarle a su mdico acerca de todas las cadas, incluso aquellas que parecen ser [employer]. Se siente mareado, cansado (tiene fatiga) o siente que pierde el equilibrio. Use los medicamentos de venta libre y los recetados solamente como se lo haya indicado el mdico. Estos incluyen suplementos. Siga una dieta sana y [place] un peso saludable. Una dieta saludable incluye productos lcteos descremados, carnes bajas en contenido de grasa ([place]), fibra de granos enteros, frijoles y [place] frutas y verduras. Mantngase al da con las vacunas. Realcese los estudios de rutina de la salud, dentales y de Wellsite geologist. Resumen Tener un estilo de vida saludable y recibir cuidados preventivos pueden ayudar a Research scientist (physical sciences) salud y el bienestar despus de los 65 aos de [place]. Realizarse pruebas de deteccin y anlisis es la mejor manera de Engineer, manufacturing un problema de salud de forma temprana y ayudarlo a Automotive engineer una cada. El diagnstico y tratamiento tempranos le brindan la mejor oportunidad de Chief Operating Officer las afecciones mdicas ms comunes en las personas mayores de 65 aos de edad. Las cadas son la causa principal de las fracturas de huesos y lesiones en la cabeza de personas mayores de 65 aos de edad. Tome precauciones para evitar una cada en su casa. Trabaje con el mdico para saber qu cambios que puede hacer para mejorar su salud y  [place], y para prevenir las cadas. Esta informacin no tiene Theme park manager el consejo del mdico. Asegrese de hacerle al mdico cualquier pregunta que tenga. Document Revised: 05/17/2021 Document Reviewed: 05/17/2021 Elsevier Patient Education  2024 [employer].

## 2024-08-18 NOTE — Progress Notes (Signed)
 Emily [name] 68 y.o.   Chief Complaint  Patient presents with   Back Pain    Patient here for back pain. She states it was first her mid and lower back now she states everything hurts. States has been hurting for about 2 months. She is also concerned about her hair has been falling out more.     HISTORY OF PRESENT ILLNESS: This is a 68 y.o. female complaining of several things: 1.  Hair loss for 1 month 2.  Chronic anemia 3.  Chronic pain all over mostly on her back 4.  History of hypertension 5.  History of dyslipidemia and prediabetes 6.  History of osteoporosis  Back Pain Pertinent negatives include no abdominal pain, chest pain, dysuria, fever, headaches or weight loss.     Prior to Admission medications   Medication Sig Start Date End Date Taking? Authorizing Provider  acetic acid -hydrocortisone  (VOSOL -HC) OTIC solution Place 3 drops into the left ear 3 (three) times daily. 05/21/23  Yes Alyiah Ulloa, Emil Schanz, MD  alendronate  (FOSAMAX ) 70 MG tablet Take 1 tablet (70 mg total) by mouth every 7 (seven) days. Take with a full glass of water on an empty stomach. 12/10/23  Yes Addisynn Vassell, Emil Schanz, MD  ALPRAZolam  (XANAX ) 0.5 MG tablet TAKE 1 TABLET BY MOUTH EVERY DAY AS NEEDED FOR ANXIETY 08/27/23  Yes Nyima Vanacker, Emil Schanz, MD  Ascorbic Acid (VITAMIN C) 1000 MG tablet Take 1,000 mg by mouth daily.   Yes [provider]  CALCIUM  PO Take 1 tablet by mouth daily.    Yes [provider]  Cholecalciferol (VITAMIN D3 PO) Take 125 mg by mouth daily.   Yes [provider]  ciclopirox  (PENLAC ) 8 % solution Apply topically at bedtime. Apply over nail and surrounding skin. Apply daily over previous coat. After seven (7) days, may remove with alcohol and continue cycle. 11/12/22  Yes McDonald, Juliene SAUNDERS, DPM  Coenzyme Q10 (CO Q-10 PO) Take 18 mg by mouth daily.   Yes [provider]  esomeprazole  (NEXIUM ) 40 MG capsule Take 40 mg by mouth daily at 12  noon.   Yes [provider]  furosemide  (LASIX ) 20 MG tablet Take 1 tablet (20 mg total) by mouth daily. 03/12/24  Yes Fountain, Madison L, NP  lisinopril  (ZESTRIL ) 40 MG tablet TAKE 1 TABLET BY MOUTH EVERY DAY 05/09/24  Yes Lark Runk, Emil Schanz, MD  metoprolol  succinate (TOPROL -XL) 25 MG 24 hr tablet Take 1 tablet (25 mg total) by mouth daily. 10/03/23  Yes Ralynn San, Emil Schanz, MD  rosuvastatin  (CRESTOR ) 40 MG tablet Take 1 tablet (40 mg total) by mouth daily. 06/04/24  Yes Lavona Agent, MD  Turmeric (QC TUMERIC COMPLEX PO) Take by mouth daily. NATURAL SUPPLEMENT   Yes [provider]  NIFEdipine  (ADALAT  CC) 30 MG 24 hr tablet     [provider]  predniSONE  (DELTASONE ) 5 MG tablet Take 1 tablet (5 mg total) by mouth 2 (two) times daily with a meal. Patient not taking: Reported on 08/18/2024 09/13/23   Barbarann Oneil BROCKS, MD    Allergies  Allergen Reactions   Ibuprofen    Aspirin Hives   Atorvastatin      Yellow fingernails   Nsaids Hives    Patient Active Problem List   Diagnosis Date Noted   Coronary artery calcification 06/03/2024   Chronic venous insufficiency 04/23/2024   Age-related osteoporosis without current pathological fracture 12/10/2023   Onychomycosis 10/30/2022   Prediabetes 02/12/2022   Hypertensive urgency 11/28/2021  History of arthritis 08/15/2020   History of gastroesophageal reflux (GERD) 08/15/2020   Carotid artery disease (HCC) 12/19/2018   Bilateral carotid artery stenosis, R- 40-59%, L 1-39% 11/2017 03/25/2018   Dyslipidemia 03/25/2018   GERD (gastroesophageal reflux disease) 04/12/2016   Chronic high back pain 09/24/2015   Essential hypertension 10/03/2012   Hyperlipidemia 10/03/2012   DYSPEPSIA&OTHER SPEC DISORDERS FUNCTION STOMACH 07/20/2009    Past Medical History:  Diagnosis Date   Abdominal pain, chronic, epigastric 11/02/2015   Started amitriptyline  at hospitalization 04/13/2016 EGD x 2 negative Also w/ chronic chest  wall and back pain Would try not to work up further    Anxiety    Arthritis    BACK,KNEES   Beta thalassemia minor    diagnosed by hematology Dr. ALONSO Chad, also with sickle cell trait   Blood transfusion without reported diagnosis    LONG TIME AGO   Chronic high back pain 09/24/2015   Colon polyps    GERD (gastroesophageal reflux disease)    Hyperlipidemia    Hypertension    Sickle cell trait (HCC)    diagnosed by hematology Dr. Chad, coexsisting with beta-thalassemia minor    Past Surgical History:  Procedure Laterality Date   ABDOMINAL HYSTERECTOMY     1998   COLONOSCOPY     ESOPHAGOGASTRODUODENOSCOPY     ESOPHAGOGASTRODUODENOSCOPY [name] 04/13/2016   Procedure: ESOPHAGOGASTRODUODENOSCOPY (EGD);  Surgeon: Lupita FORBES Commander, MD;  Location: [facility];  Service: Endoscopy;  Laterality: [name];    Social History   Socioeconomic History   Marital status: Married    Spouse name: Not on file   Number of children: 3   Years of education: Not on file   Highest education level: Not on file  Occupational History   Not on file  Tobacco Use   Smoking status: Never    Passive exposure: Never   Smokeless tobacco: Never  Vaping Use   Vaping status: Never Used  Substance and Sexual Activity   Alcohol use: No    Alcohol/week: 0.0 standard drinks of alcohol   Drug use: No   Sexual activity: Yes    Comment: 1st intercourse- 64, partner-1, married- 42 yrs   Other Topics Concern   Not on file  Social History Narrative   Lives with husband and works at [employer]   Social Drivers of Corporate investment banker Strain: Not on file  Food Insecurity: Not on file  Transportation Needs: Not on file  Physical Activity: Not on file  Stress: Not on file  Social Connections: Not on file  Intimate Partner Violence: Not on file    Family History  Problem Relation Age of Onset   Aneurysm Mother    Diabetes Father    Colon polyps Brother    Colon cancer Brother    Liver cancer  Brother    Colon cancer Nephew    Esophageal cancer Neg Hx    Stomach cancer Neg Hx    Pancreatic cancer Neg Hx    Rectal cancer Neg Hx    Breast cancer Neg Hx      Review of Systems  Constitutional:  Negative for chills, fever, malaise/fatigue and weight loss.  HENT: Negative.  Negative for congestion and sore throat.   Respiratory: Negative.  Negative for cough and shortness of breath.   Cardiovascular: Negative.  Negative for chest pain and palpitations.  Gastrointestinal:  Negative for abdominal pain, diarrhea, nausea and vomiting.  Genitourinary: Negative.  Negative for dysuria and hematuria.  Musculoskeletal:  Positive for back pain and joint pain.  Skin: Negative.  Negative for rash.  Neurological: Negative.  Negative for dizziness and headaches.  All other systems reviewed and are negative.   Today's Vitals   08/18/24 1356  BP: 124/78  Pulse: (!) 57  Temp: 98.8 F (37.1 C)  TempSrc: Oral  SpO2: 96%  Weight: 184 lb (83.5 kg)  Height: 5' 6 (1.676 m)   Body mass index is 29.7 kg/m.   Physical Exam Vitals reviewed.  Constitutional:      Appearance: Normal appearance.  HENT:     Head: Normocephalic.     Mouth/Throat:     Mouth: Mucous membranes are moist.     Pharynx: Oropharynx is clear.  Eyes:     Extraocular Movements: Extraocular movements intact.     Conjunctiva/sclera: Conjunctivae normal.     Pupils: Pupils are equal, round, and reactive to light.  Cardiovascular:     Rate and Rhythm: Normal rate and regular rhythm.     Pulses: Normal pulses.     Heart sounds: Normal heart sounds.  Pulmonary:     Effort: Pulmonary effort is normal.     Breath sounds: Normal breath sounds.  Abdominal:     Palpations: Abdomen is soft.     Tenderness: There is no abdominal tenderness.  Musculoskeletal:     Cervical back: No tenderness.  Lymphadenopathy:     Cervical: No cervical adenopathy.  Skin:    General: Skin is warm and dry.     Capillary Refill:  Capillary refill takes less than 2 seconds.  Neurological:     General: No focal deficit present.     Mental Status: She is alert and oriented to person, place, and time.  Psychiatric:        Mood and Affect: Mood normal.        Behavior: Behavior normal.      ASSESSMENT & PLAN: A total of 42 minutes was spent with the patient and counseling/coordination of care regarding preparing for this visit, review of most recent office visit notes, review of multiple chronic medical conditions and their management, pain management, review of all medications, review of most recent bloodwork results, review of health maintenance items, education on nutrition, prognosis, documentation, and need for follow up.   Problem List Items Addressed This Visit       Cardiovascular and Mediastinum   Essential hypertension - Primary   BP Readings from Last 3 Encounters:  08/18/24 124/78  06/04/24 126/86  04/23/24 114/74  Well-controlled hypertension Continue lisinopril  40 mg and metoprolol  succinate 25 mg daily Cardiovascular risks associated with hypertension discussed Diet and nutrition discussed       Relevant Medications   metoprolol  succinate (TOPROL -XL) 25 MG 24 hr tablet   rosuvastatin  (CRESTOR ) 40 MG tablet   lisinopril  (ZESTRIL ) 40 MG tablet   Other Relevant Orders   Comprehensive metabolic panel with GFR     Digestive   GERD (gastroesophageal reflux disease)   Well-controlled on Nexium  40 mg daily        Musculoskeletal and Integument   Age-related osteoporosis without current pathological fracture   Stable.  Tolerating alendronate  70 mg milligrams weekly        Other   Chronic high back pain   Chronic pain.  Old injury. Recommend Tylenol  and or Advil for pain as needed May need orthopedic referral      Relevant Medications   DULoxetine  (CYMBALTA ) 30 MG capsule   Dyslipidemia   Chronic stable  condition Diet and nutrition discussed Has developed allergies to greasy  foods Continue rosuvastatin  20 mg daily      Relevant Medications   rosuvastatin  (CRESTOR ) 40 MG tablet   Other Relevant Orders   Lipid panel   Prediabetes   Diet and nutrition discussed Benefits of exercise discussed        Relevant Orders   Hemoglobin A1c   Chronic anemia   Recommend anemia workup today Asymptomatic.      Relevant Orders   Vitamin B12   Folate   Iron and TIBC   Ferritin   CBC with Differential/Platelet   Hair loss   Multifactorial.  Differential diagnosis discussed May be related to hair coloring agent. States last time scalp was itching for a while      Relevant Orders   TSH   VITAMIN D  25 Hydroxy (Vit-D Deficiency, Fractures)   Chronic pain syndrome   Neuropathic pain component to it. Recommend trial of Cymbalta  30 mg daily for 30 days May need referral to pain management clinic Diffuse soft tissue tenderness on physical examination No active synovitis on joints May have fibromyalgia       Relevant Medications   DULoxetine  (CYMBALTA ) 30 MG capsule   Other Relevant Orders   ANA,IFA RA Diag Pnl w/rflx Tit/Patn   Sedimentation rate   Patient Instructions  Mantenimiento de la salud despus de los 65 aos de edad Health Maintenance After Age 43 Despus de los 65 aos de edad, corre un riesgo mayor de Film/video editor enfermedades e infecciones a largo plazo, como tambin de sufrir lesiones por cadas. Las cadas son la causa principal de las fracturas de huesos y lesiones en la cabeza de personas mayores de 65 aos de edad. Recibir cuidados preventivos de forma regular puede ayudarlo a mantenerse saludable y en buen [place]. Los cuidados preventivos incluyen realizarse anlisis de forma regular y Forensic psychologist en el estilo de vida segn las recomendaciones del mdico. Converse con el mdico sobre lo siguiente: Las pruebas de deteccin y los anlisis que debe International aid/development worker. Una prueba de deteccin es un estudio que se para Engineer, manufacturing la presencia de  una enfermedad cuando no tiene sntomas. Un plan de dieta y ejercicios adecuado para usted. Qu debo saber sobre las pruebas de deteccin y los anlisis para prevenir cadas? Realizarse pruebas de deteccin y [employer] es la mejor manera de Engineer, manufacturing un problema de salud de forma temprana. El diagnstico y tratamiento tempranos le brindan la mejor oportunidad de Chief Operating Officer las afecciones mdicas que son comunes despus de los 65 aos de edad. Ciertas afecciones y elecciones de estilo de vida pueden hacer que sea ms propenso a sufrir Engineer, site. El mdico puede recomendarle lo siguiente: Controles regulares de la visin. Una visin deficiente y afecciones como las cataratas pueden hacer que sea ms propenso a sufrir Engineer, site. Si usa  lentes, asegrese de obtener una receta actualizada si su visin cambia. Revisin de medicamentos. Revise regularmente con el mdico todos los medicamentos que toma, incluidos los medicamentos de [place]. Consulte al [employer] efectos secundarios que pueden hacer que sea ms propenso a sufrir Engineer, site. Informe al mdico si alguno de los medicamentos que toma lo hace sentir mareado o somnoliento. Controles de fuerza y equilibrio. El mdico puede recomendar ciertos estudios para controlar su fuerza y equilibrio al estar de pie, al caminar o al cambiar de posicin. Examen de los pies. El dolor y Materials engineer en los pies, como tambin no utilizar el calzado adecuado,  pueden hacer que sea ms propenso a sufrir Engineer, site. Pruebas de deteccin, que incluyen las siguientes: Pruebas de deteccin para la osteoporosis. La osteoporosis es una afeccin que hace que los huesos se tornen ms dbiles y se quiebren con ms facilidad. Pruebas de deteccin para la presin arterial. Los cambios en la presin arterial y los medicamentos para Chief Operating Officer la presin arterial pueden hacerlo sentir mareado. Prueba de deteccin de la depresin. Es ms probable que sufra una cada si tiene  miedo a caerse, se siente deprimido o se siente incapaz de Probation officer. Prueba de deteccin de consumo de alcohol. Beber demasiado alcohol puede afectar su equilibrio y puede hacer que sea ms propenso a sufrir Engineer, site. Siga estas indicaciones en su casa: Estilo de vida No beba alcohol si: Su mdico le indica no hacerlo. Si bebe alcohol: Limite la cantidad que bebe a lo siguiente: De 0 a 1 medida por da para las mujeres. De 0 a 2 medidas por da para los hombres. Sepa cunta cantidad de alcohol hay en las bebidas que toma. En los [address], una medida equivale a una botella de cerveza de 12 oz (355 ml), un vaso de vino de 5 oz (148 ml) o un vaso de una bebida alcohlica de alta graduacin de 1 oz (44 ml). No consuma ningn producto que contenga nicotina o tabaco. Estos productos incluyen cigarrillos, tabaco para Theatre manager y aparatos de vapeo, como los cigarrillos electrnicos. Si necesita ayuda para dejar de consumir estos productos, consulte al [employer]. Actividad  Siga un programa de ejercicio regular para mantenerse en forma. Esto lo ayudar a Radio producer equilibrio. Consulte al mdico qu tipos de ejercicios son adecuados para usted. Si necesita un bastn o un andador, selo segn las recomendaciones del mdico. Utilice calzado con buen apoyo y suela antideslizante. Seguridad  Retire los [employer] puedan causar tropiezos tales como alfombras, cables u obstculos. Instale equipos de seguridad, como barras para sostn en los baos y barandas de seguridad en las escaleras. Mantenga las habitaciones y los pasillos bien iluminados. Indicaciones generales Hable con el mdico sobre sus riesgos de sufrir una cada. Infrmele a su mdico si: Se cae. Asegrese de informarle a su mdico acerca de todas las cadas, incluso aquellas que parecen ser [employer]. Se siente mareado, cansado (tiene fatiga) o siente que pierde el equilibrio. Use los medicamentos de venta libre  y los recetados solamente como se lo haya indicado el mdico. Estos incluyen suplementos. Siga una dieta sana y [place] un peso saludable. Una dieta saludable incluye productos lcteos descremados, carnes bajas en contenido de grasa ([place]), fibra de granos enteros, frijoles y [place] frutas y verduras. Mantngase al da con las vacunas. Realcese los estudios de rutina de la salud, dentales y de Wellsite geologist. Resumen Tener un estilo de vida saludable y recibir cuidados preventivos pueden ayudar a Research scientist (physical sciences) salud y el bienestar despus de los 65 aos de [place]. Realizarse pruebas de deteccin y anlisis es la mejor manera de Engineer, manufacturing un problema de salud de forma temprana y ayudarlo a Automotive engineer una cada. El diagnstico y tratamiento tempranos le brindan la mejor oportunidad de Chief Operating Officer las afecciones mdicas ms comunes en las personas mayores de 65 aos de edad. Las cadas son la causa principal de las fracturas de huesos y lesiones en la cabeza de personas mayores de 65 aos de edad. Tome precauciones para evitar una cada en su casa. Trabaje con el mdico para saber qu cambios que puede hacer para  mejorar su salud y [place], y para prevenir las cadas. Esta informacin no tiene Theme park manager el consejo del mdico. Asegrese de hacerle al mdico cualquier pregunta que tenga. Document Revised: 05/17/2021 Document Reviewed: 05/17/2021 Elsevier Patient Education  2024 Elsevier Inc.      Emil Schaumann, MD [facility]

## 2024-08-18 NOTE — Assessment & Plan Note (Addendum)
 BP Readings from Last 3 Encounters:  08/18/24 124/78  06/04/24 126/86  04/23/24 114/74  Well-controlled hypertension Continue lisinopril  40 mg and metoprolol  succinate 25 mg daily Cardiovascular risks associated with hypertension discussed Diet and nutrition discussed

## 2024-08-18 NOTE — Assessment & Plan Note (Signed)
 Multifactorial.  Differential diagnosis discussed May be related to hair coloring agent. States last time scalp was itching for a while

## 2024-08-18 NOTE — Assessment & Plan Note (Signed)
 Recommend anemia workup today Asymptomatic.

## 2024-08-18 NOTE — Assessment & Plan Note (Signed)
Well-controlled on Nexium 40 mg daily. 

## 2024-08-18 NOTE — Assessment & Plan Note (Signed)
 Stable.  Tolerating alendronate  70 mg milligrams weekly

## 2024-08-19 ENCOUNTER — Telehealth: Payer: Self-pay

## 2024-08-19 NOTE — Telephone Encounter (Signed)
 Copied from CRM [id number]. Topic: Clinical - Lab/Test Results >> Aug 19, 2024 11:16 AM Adelita E wrote: Reason for CRM: *Interpreter used for this call* Patient would like a phone call when lab results from yesterday 8/26 are in.

## 2024-08-20 LAB — ANA,IFA RA DIAG PNL W/RFLX TIT/PATN
Anti Nuclear Antibody (ANA): POSITIVE — AB
Cyclic Citrullin Peptide Ab: <16 U
Rheumatoid fact SerPl-aCnc: <10 [IU]/mL (ref <14)

## 2024-08-20 LAB — ANTI-NUCLEAR AB-TITER (ANA TITER)
ANA TITER: 1:40 {titer} — ABNORMAL HIGH
ANA Titer 1: 1:40 {titer} — ABNORMAL HIGH

## 2024-08-20 LAB — IRON, TOTAL/TOTAL IRON BINDING CAP
%SAT: 16 % (ref 16–45)
Iron: 51 ug/dL (ref 45–160)
TIBC: 317 ug/dL (ref 250–450)

## 2024-08-21 ENCOUNTER — Ambulatory Visit: Payer: Self-pay | Admitting: Emergency Medicine

## 2024-08-21 DIAGNOSIS — M790 Rheumatism, unspecified: Secondary | ICD-10-CM

## 2024-08-21 DIAGNOSIS — R768 Other specified abnormal immunological findings in serum: Secondary | ICD-10-CM

## 2024-08-21 NOTE — Telephone Encounter (Signed)
 Returned patients call with interpreter and LVM for patient to call back regarding her questions

## 2024-08-27 ENCOUNTER — Telehealth: Payer: Self-pay

## 2024-08-27 NOTE — Telephone Encounter (Signed)
 Copied from CRM #[id number]. Topic: Clinical - Lab/Test Results >> Aug 26, 2024  4:29 PM Jasmin G wrote: Reason for CRM: Pt requested a call back to discuss recent test results, please call her back at [phone] to discuss, pt needs a Spanish interpreter.

## 2024-08-28 NOTE — Telephone Encounter (Signed)
 Returned patients call. LVM with interpreter for patient to call back regarding her questions

## 2024-09-11 ENCOUNTER — Other Ambulatory Visit: Payer: Self-pay | Admitting: Emergency Medicine

## 2024-09-14 ENCOUNTER — Ambulatory Visit: Payer: Self-pay

## 2024-09-14 NOTE — Telephone Encounter (Signed)
  Spanish Interpreter utilized during this interaction: Patient is advised that with severe pain in the left side of her chest under her left breast, radiating to the back that is worsening and hurts with a deep breath---she is advised ER & patient states she will go  FYI Only or Action Required?: Action required by provider: request for appointment and update on patient condition.  Patient was last seen in primary care on 08/18/2024 by [place] Emil Schanz, MD.  Called Nurse Triage reporting Chest Pain and Back Pain.  Symptoms began 3 weeks ago.  Interventions attempted: Rest, hydration, or home remedies.  Symptoms are: gradually worsening.  Triage Disposition: Go to ED Now (or PCP Triage)  Patient/caregiver understands and will follow disposition?: Unsure                   Copied from CRM #[id number]. Topic: Clinical - Red Word Triage >> Sep 14, 2024  1:39 PM Rea ORN wrote: Red Word that prompted transfer to Nurse Triage: Pt having back pain that is radiating to her front for the past 3 weeks. The pain is getting worse. Reason for Disposition  Taking a deep breath makes pain worse  Answer Assessment - Initial Assessment Questions Just below left breast Three weeks ago it started ---patient fell three weeks ago --walking and tripped and fell on abdomen Patient states pain is getting worse and more often & it hurts to take a deep breath Patient states the pain is a 9 out of 10 at this time Patient is advised that at this time, having severe chest pain that is worsening, and having pain when taking a deep breath--it is recommended that she goes to the Emergency Room. Patient wants an appointment Thursday afternoon with her PCP.  Patient is advised that at this time the ER is recommended for her symptoms. She states okay and she is also advised that if anything worsens 911  Patient states okay. Spanish Interpreter used during this interaction.   1. LOCATION: Where  does it hurt?       Below left breast 2. RADIATION: Does the pain go anywhere else? (e.g., into neck, jaw, arms, back)     Around below left breast 3. ONSET: When did the chest pain begin? (Minutes, hours or days)      Three weeks ago 4. PATTERN: Does the pain come and go, or has it been constant since it started?  Does it get worse with exertion?      Comes and goes but sometimes worse 5. DURATION: How long does it last (e.g., seconds, minutes, hours)     ---- 6. SEVERITY: How bad is the pain?  (e.g., Scale 1-10; mild, moderate, or severe)     9 or so 7. CARDIAC RISK FACTORS: Do you have any history of heart problems or risk factors for heart disease? (e.g., angina, prior heart attack; diabetes, high blood pressure, high cholesterol, smoker, or strong family history of heart disease)     ----- 8. PULMONARY RISK FACTORS: Do you have any history of lung disease?  (e.g., blood clots in lung, asthma, emphysema, birth control pills)     ----- 9. CAUSE: What do you think is causing the chest pain?     Unsure=---fell three weeks ago 10. OTHER SYMPTOMS: Do you have any other symptoms? (e.g., dizziness, nausea, vomiting, sweating, fever, difficulty breathing, cough)  Protocols used: Chest Pain-A-AH

## 2024-09-14 NOTE — Telephone Encounter (Signed)
 Called [facility] to advise them of patient's current symptoms

## 2024-09-21 ENCOUNTER — Ambulatory Visit: Payer: Self-pay | Admitting: Emergency Medicine

## 2024-09-21 ENCOUNTER — Ambulatory Visit: Admitting: Emergency Medicine

## 2024-09-21 ENCOUNTER — Encounter: Payer: Self-pay | Admitting: Emergency Medicine

## 2024-09-21 ENCOUNTER — Ambulatory Visit (INDEPENDENT_AMBULATORY_CARE_PROVIDER_SITE_OTHER)

## 2024-09-21 VITALS — BP 128/76 | HR 55 | Temp 98.3°F | Ht 66.0 in | Wt 184.0 lb

## 2024-09-21 DIAGNOSIS — J22 Unspecified acute lower respiratory infection: Secondary | ICD-10-CM

## 2024-09-21 DIAGNOSIS — E785 Hyperlipidemia, unspecified: Secondary | ICD-10-CM

## 2024-09-21 DIAGNOSIS — R7303 Prediabetes: Secondary | ICD-10-CM

## 2024-09-21 DIAGNOSIS — I10 Essential (primary) hypertension: Secondary | ICD-10-CM | POA: Diagnosis not present

## 2024-09-21 DIAGNOSIS — R079 Chest pain, unspecified: Secondary | ICD-10-CM

## 2024-09-21 DIAGNOSIS — R6889 Other general symptoms and signs: Secondary | ICD-10-CM | POA: Insufficient documentation

## 2024-09-21 DIAGNOSIS — K219 Gastro-esophageal reflux disease without esophagitis: Secondary | ICD-10-CM

## 2024-09-21 DIAGNOSIS — R051 Acute cough: Secondary | ICD-10-CM | POA: Insufficient documentation

## 2024-09-21 MED ORDER — BENZONATATE 200 MG PO CAPS: 200.0000 mg | ORAL_CAPSULE | Freq: Two times a day (BID) | ORAL | 0 refills | Status: DC | PRN

## 2024-09-21 MED ORDER — AZITHROMYCIN 250 MG PO TABS: ORAL_TABLET | ORAL | 0 refills | Status: DC

## 2024-09-21 NOTE — Assessment & Plan Note (Signed)
 BP Readings from Last 3 Encounters:  09/21/24 128/76  08/18/24 124/78  06/04/24 126/86  Well-controlled hypertension Continue lisinopril  40 mg, metoprolol  succinate 25 mg

## 2024-09-21 NOTE — Assessment & Plan Note (Signed)
 Since onset of respiratory infection Clinically stable.  Differential diagnosis discussed May have pleurisy Recommend Tylenol  and or Advil as needed

## 2024-09-21 NOTE — Assessment & Plan Note (Signed)
Diet and nutrition discussed Benefits of exercise discussed

## 2024-09-21 NOTE — Assessment & Plan Note (Signed)
 Chronic stable condition Diet and nutrition discussed Has developed allergies to greasy foods Continue rosuvastatin 20 mg daily

## 2024-09-21 NOTE — Assessment & Plan Note (Signed)
 Cough management discussed Recommend over-the-counter Mucinex DM and cough drops Tessalon  200 mg 3 times daily

## 2024-09-21 NOTE — Assessment & Plan Note (Signed)
 Symptom management discussed Advised to stay well-hydrated and rest

## 2024-09-21 NOTE — Progress Notes (Signed)
 Emily [name] 68 y.o.   Chief Complaint  Patient presents with   Chest Pain   Cough    Productive cough w green sputum x20 days, denies sore throat    HISTORY OF PRESENT ILLNESS: This is a 68 y.o. female complaining of productive cough with flulike symptoms started about 3 weeks ago Also complaining of lower sternal sharp pain for the same amount of time Cough is productive of yellow-green sputum Denies difficulty breathing.  Denies fever or chills. Some congestion and generalized achiness No other associated symptoms No other complaints or medical concerns today.   Chest Pain  Associated symptoms include a cough and sputum production. Pertinent negatives include no abdominal pain, dizziness, fever, headaches, nausea, palpitations, shortness of breath or vomiting.  Cough Associated symptoms include chest pain. Pertinent negatives include no chills, fever, headaches, rash or shortness of breath.     Prior to Admission medications   Medication Sig Start Date End Date Taking? Authorizing Provider  acetic acid -hydrocortisone  (VOSOL -HC) OTIC solution Place 3 drops into the left ear 3 (three) times daily. 05/21/23  Yes Reeya Bound, Emil Schanz, MD  alendronate  (FOSAMAX ) 70 MG tablet Take 1 tablet (70 mg total) by mouth every 7 (seven) days. Take with a full glass of water on an empty stomach. 12/10/23  Yes Hodaya Curto, Emil Schanz, MD  ALPRAZolam  (XANAX ) 0.5 MG tablet TAKE 1 TABLET BY MOUTH EVERY DAY AS NEEDED FOR ANXIETY 08/27/23  Yes Wilmoth Rasnic, Emil Schanz, MD  Ascorbic Acid (VITAMIN C) 1000 MG tablet Take 1,000 mg by mouth daily.   Yes [provider]  CALCIUM  PO Take 1 tablet by mouth daily.    Yes [provider]  Cholecalciferol (VITAMIN D3 PO) Take 125 mg by mouth daily.   Yes [provider]  ciclopirox  (PENLAC ) 8 % solution Apply topically at bedtime. Apply over nail and surrounding skin. Apply daily over previous coat. After seven (7) days, may remove  with alcohol and continue cycle. 11/12/22  Yes McDonald, Juliene SAUNDERS, DPM  Coenzyme Q10 (CO Q-10 PO) Take 18 mg by mouth daily.   Yes [provider]  DULoxetine  (CYMBALTA ) 30 MG capsule TAKE 1 CAPSULE BY MOUTH EVERY DAY 09/11/24  Yes Colonel Krauser, Emil Schanz, MD  esomeprazole  (NEXIUM ) 40 MG capsule Take 40 mg by mouth daily at 12 noon.   Yes [provider]  furosemide  (LASIX ) 20 MG tablet Take 1 tablet (20 mg total) by mouth daily. 03/12/24  Yes Fountain, Madison L, NP  lisinopril  (ZESTRIL ) 40 MG tablet Take 1 tablet (40 mg total) by mouth daily. 08/18/24  Yes Angeliah Wisdom, Emil Schanz, MD  metoprolol  succinate (TOPROL -XL) 25 MG 24 hr tablet Take 1 tablet (25 mg total) by mouth daily. 08/18/24  Yes Eura Mccauslin, Emil Schanz, MD  rosuvastatin  (CRESTOR ) 40 MG tablet Take 1 tablet (40 mg total) by mouth daily. 08/18/24  Yes Denora Wysocki, Emil Schanz, MD  Turmeric (QC TUMERIC COMPLEX PO) Take by mouth daily. NATURAL SUPPLEMENT   Yes [provider]  NIFEdipine  (ADALAT  CC) 30 MG 24 hr tablet     [provider]    Allergies  Allergen Reactions   Ibuprofen    Aspirin Hives   Atorvastatin      Yellow fingernails   Nsaids Hives    Patient Active Problem List   Diagnosis Date Noted   Chronic anemia 08/18/2024   Hair loss 08/18/2024   Chronic pain syndrome 08/18/2024   Coronary artery calcification 06/03/2024   Chronic venous insufficiency 04/23/2024   Age-related  osteoporosis without current pathological fracture 12/10/2023   Prediabetes 02/12/2022   History of arthritis 08/15/2020   History of gastroesophageal reflux (GERD) 08/15/2020   Carotid artery disease 12/19/2018   Bilateral carotid artery stenosis, R- 40-59%, L 1-39% 11/2017 03/25/2018   Dyslipidemia 03/25/2018   GERD (gastroesophageal reflux disease) 04/12/2016   Chronic high back pain 09/24/2015   Essential hypertension 10/03/2012    Past Medical History:  Diagnosis Date   Abdominal pain, chronic, epigastric  11/02/2015   Started amitriptyline  at hospitalization 04/13/2016 EGD x 2 negative Also w/ chronic chest wall and back pain Would try not to work up further    Anxiety    Arthritis    BACK,KNEES   Beta thalassemia minor    diagnosed by hematology Dr. ALONSO Chad, also with sickle cell trait   Blood transfusion without reported diagnosis    LONG TIME AGO   Chronic high back pain 09/24/2015   Colon polyps    GERD (gastroesophageal reflux disease)    Hyperlipidemia    Hypertension    Sickle cell trait    diagnosed by hematology Dr. Chad, coexsisting with beta-thalassemia minor    Past Surgical History:  Procedure Laterality Date   ABDOMINAL HYSTERECTOMY     1998   COLONOSCOPY     ESOPHAGOGASTRODUODENOSCOPY     ESOPHAGOGASTRODUODENOSCOPY [name] 04/13/2016   Procedure: ESOPHAGOGASTRODUODENOSCOPY (EGD);  Surgeon: Lupita FORBES Commander, MD;  Location: [facility];  Service: Endoscopy;  Laterality: [name];    Social History   Socioeconomic History   Marital status: Married    Spouse name: Not on file   Number of children: 3   Years of education: Not on file   Highest education level: Not on file  Occupational History   Not on file  Tobacco Use   Smoking status: Never    Passive exposure: Never   Smokeless tobacco: Never  Vaping Use   Vaping status: Never Used  Substance and Sexual Activity   Alcohol use: No    Alcohol/week: 0.0 standard drinks of alcohol   Drug use: No   Sexual activity: Yes    Comment: 1st intercourse- 83, partner-1, married- 42 yrs   Other Topics Concern   Not on file  Social History Narrative   Lives with husband and works at [employer]   Social Drivers of Corporate investment banker Strain: Not on file  Food Insecurity: Not on file  Transportation Needs: Not on file  Physical Activity: Not on file  Stress: Not on file  Social Connections: Not on file  Intimate Partner Violence: Not on file    Family History  Problem Relation Age of Onset   Aneurysm  Mother    Diabetes Father    Colon polyps Brother    Colon cancer Brother    Liver cancer Brother    Colon cancer Nephew    Esophageal cancer Neg Hx    Stomach cancer Neg Hx    Pancreatic cancer Neg Hx    Rectal cancer Neg Hx    Breast cancer Neg Hx      Review of Systems  Constitutional: Negative.  Negative for chills and fever.  HENT:  Positive for congestion.   Respiratory:  Positive for cough and sputum production. Negative for shortness of breath.   Cardiovascular:  Positive for chest pain. Negative for palpitations.  Gastrointestinal:  Negative for abdominal pain, diarrhea, nausea and vomiting.  Genitourinary: Negative.  Negative for dysuria and hematuria.  Skin: Negative.  Negative for rash.  Neurological: Negative.  Negative for dizziness and headaches.  All other systems reviewed and are negative.   Vitals:   09/21/24 1442  BP: 128/76  Pulse: (!) 55  Temp: 98.3 F (36.8 C)  SpO2: 99%    Physical Exam Vitals reviewed.  Constitutional:      Appearance: Normal appearance.  HENT:     Head: Normocephalic.     Mouth/Throat:     Mouth: Mucous membranes are moist.     Pharynx: Oropharynx is clear.  Eyes:     Extraocular Movements: Extraocular movements intact.     Conjunctiva/sclera: Conjunctivae normal.  Cardiovascular:     Rate and Rhythm: Normal rate and regular rhythm.     Pulses: Normal pulses.     Heart sounds: Normal heart sounds.  Pulmonary:     Effort: Pulmonary effort is normal.     Breath sounds: Normal breath sounds.  Musculoskeletal:     Cervical back: No tenderness.  Lymphadenopathy:     Cervical: No cervical adenopathy.  Skin:    General: Skin is warm and dry.  Neurological:     General: No focal deficit present.     Mental Status: She is alert and oriented to person, place, and time.  Psychiatric:        Mood and Affect: Mood normal.        Behavior: Behavior normal.    EKG: Sinus bradycardia with ventricular rate of 56/min.  No  acute ischemic changes.  Otherwise normal EKG.  DG Chest 2 View Result Date: 09/21/2024 CLINICAL DATA:  Productive cough.  Concern for pneumonia. EXAM: CHEST - 2 VIEW COMPARISON:  Chest radiograph dated 03/03/2024. FINDINGS: No focal consolidation, pleural effusion or pneumothorax. Background of emphysema. Stable mild cardiomegaly. Hiatal hernia. No acute osseous pathology. IMPRESSION: 1. No active cardiopulmonary disease. 2. Emphysema.  Two Electronically Signed   By: Vanetta Chou M.D.   On: 09/21/2024 15:37     ASSESSMENT & PLAN: A total of 40 minutes was spent with the patient and counseling/coordination of care regarding preparing for this visit, review of most recent office visit notes, review of multiple chronic medical conditions and their management, differential diagnosis of chest pain, review of chest x-ray images done today, review of all medications, review of most recent bloodwork results, review of health maintenance items, education on nutrition, prognosis, documentation, and need for follow up.   Problem List Items Addressed This Visit       Cardiovascular and Mediastinum   Essential hypertension   BP Readings from Last 3 Encounters:  09/21/24 128/76  08/18/24 124/78  06/04/24 126/86  Well-controlled hypertension Continue lisinopril  40 mg, metoprolol  succinate 25 mg         Respiratory   Lower respiratory infection - Primary   Clinically stable.  No red flag signs or symptoms Unremarkable exam Recommend chest x-ray today.  Will review images when available Recommend daily azithromycin  for 5 days Symptom management discussed Advised to rest and stay well-hydrated ED precautions given Advised to contact the office if no better or worse during the next several days      Relevant Medications   azithromycin  (ZITHROMAX ) 250 MG tablet   Other Relevant Orders   DG Chest 2 View (Completed)     Digestive   GERD (gastroesophageal reflux disease)   Well-controlled  on Nexium  40 mg daily         Other   Nonspecific chest pain   Since onset of respiratory infection Clinically stable.  Differential  diagnosis discussed May have pleurisy Recommend Tylenol  and or Advil as needed      Relevant Orders   EKG 12-Lead   Dyslipidemia   Chronic stable condition Diet and nutrition discussed Has developed allergies to greasy foods Continue rosuvastatin  20 mg daily      Prediabetes   Diet and nutrition discussed Benefits of exercise discussed        Acute cough   Cough management discussed Recommend over-the-counter Mucinex DM and cough drops Tessalon  200 mg 3 times daily      Relevant Medications   benzonatate  (TESSALON ) 200 MG capsule   Flu-like symptoms   Symptom management discussed Advised to stay well-hydrated and rest      Patient Instructions  Bronquitis aguda en los adultos Acute Bronchitis, Adult  La bronquitis aguda es la inflamacin repentina de las vas areas (bronquios) de los pulmones. Esta afeccin puede dificultar la respiracin. En los adultos, la bronquitis aguda generalmente desaparece en 2 semanas. La tos provocada por la bronquitis puede durar hasta 3 semanas. El hbito de fumar, las Environmental consultant y el asma pueden empeorar esta afeccin. Cules son las causas? Los microbios que causan el resfro y la gripe (virus). La causa ms frecuente de esta afeccin es el virus que provoca el resfro comn. Bacterias. Sustancias que molestan (irritan) los pulmones, lo que incluye: Humo de cigarrillos y otros productos de tabaco. Polvo y polen. Vapores de productos qumicos, gases o combustible quemado. Contaminacin del aire interior o exterior. Qu incrementa el riesgo? El sistema de defensa del cuerpo debilitado. Este tambin se denomina sistema inmunitario. Cualquier afeccin que afecte a los pulmones y la respiracin, como el asma. Cules son los signos o sntomas? Tos. Despedir ignacia mucosidad transparente, amarilla o  verde al toser. Emitir sonidos de silbidos agudos al respirar, ms a menudo al exhalar (sibilancias). Secrecin o congestin nasal. Exceso de mucosidad en los pulmones (congestin torcica). Falta de aire. Dolores [employer] cuerpo. Dolor de Advertising copywriter. Cmo se trata? La bronquitis aguda puede desaparecer con [employer], sin tratamiento. Su mdico puede recomendarle lo siguiente: Beba ms lquidos. Esto ayudar a diluir la mucosidad de modo que sea ms fcil expectorarla. Usar un dispositivo que Education administrator en los pulmones (inhalador). Utilizar un humidificador o vaporizador. Estas son mquinas que agregan agua al aire. Esto ayuda con la tos y con la respiracin deficiente. Tomar un medicamento que diluya la mucosidad y ayude a eliminarla de los pulmones. Tomar un medicamento que prevenga o detenga la tos. No es frecuente tomar un antibitico para esta afeccin. Siga estas indicaciones en su casa:  Use los medicamentos de venta libre y los recetados solamente como se lo haya indicado el mdico. Use un Armed forces operational officer, un humidificador o un vaporizador tal como se lo haya indicado el mdico. [employer] cucharaditas (10 ml) de miel a la hora de [place]. Esto ayuda a disminuir la tos por la noche. Beba suficiente lquido para Radio producer pis (la orina) de color amarillo plido. No fume ni consuma ningn producto que contenga nicotina o tabaco. Si necesita ayuda para dejar de fumar, consulte al mdico. Descanse lo suficiente. Regrese a sus actividades normales cuando el [employer] diga que es [place]. Concurra a todas las visitas de seguimiento. Cmo se previene?  Lvese las manos frecuentemente con agua y jabn durante al menos 20 segundos. Use un desinfectante para manos si no dispone de agua y jabn. Evite el contacto con personas que tienen sntomas de resfro. Trate de no llevarse las manos  a la boca, la [employer]. Evite inhalar humo o vapores qumicos. Recuerde aplicarse la  vacuna contra la gripe todos los [place]. Comunquese con un mdico si: Los sntomas no mejoran en el trmino de 2 semanas. Tiene dificultad para expulsar la mucosidad al toser. La tos lo mantiene despierto por la noche. Tiene fiebre. Solicite ayuda de inmediato si: Tose y [employer]. Siente dolor en el pecho. Sufre un episodio muy intenso de falta de aire. Se desmaya o se siente como si se fuera a desmayar. Tiene un dolor de cabeza muy intenso. La fiebre o los escalofros empeoran. Estos sntomas pueden Customer service manager. Solicite ayuda de inmediato. Comunquese con el servicio de emergencias de su localidad (911 en los [place]). No espere a ver si los sntomas desaparecen. No conduzca por sus propios medios [employer]. Resumen La bronquitis aguda es la inflamacin repentina de las vas areas (bronquios) de los pulmones. En los adultos, la bronquitis aguda generalmente desaparece en 2 semanas. Beba ms lquidos. Esto ayudar a diluir la mucosidad de modo que sea ms fcil expectorarla. Use los medicamentos de venta libre y los recetados solamente como se lo haya indicado el mdico. Comunquese con un mdico si los sntomas no mejoran despus de 2 semanas de [place]. Esta informacin no tiene Theme park manager el consejo del mdico. Asegrese de hacerle al mdico cualquier pregunta que tenga. Document Revised: 05/01/2021 Document Reviewed: 05/01/2021 Elsevier Patient Education  2024 Elsevier Inc.    Emil Schaumann, MD [facility]

## 2024-09-21 NOTE — Assessment & Plan Note (Signed)
 Clinically stable.  No red flag signs or symptoms Unremarkable exam Recommend chest x-ray today.  Will review images when available Recommend daily azithromycin  for 5 days Symptom management discussed Advised to rest and stay well-hydrated ED precautions given Advised to contact the office if no better or worse during the next several days

## 2024-09-21 NOTE — Assessment & Plan Note (Signed)
Well-controlled on Nexium 40 mg daily. 

## 2024-09-21 NOTE — Patient Instructions (Signed)
Bronquitis aguda en los adultos Acute Bronchitis, Adult  La bronquitis aguda es la inflamacin repentina de las vas areas (bronquios) de los pulmones. Esta afeccin puede dificultar la respiracin. En los adultos, la bronquitis aguda generalmente desaparece en 2 semanas. La tos provocada por la bronquitis puede durar hasta 3 semanas. El hbito de fumar, las Environmental consultant y el asma pueden empeorar esta afeccin. Cules son las causas? Los microbios que causan el resfro y la gripe (virus). La causa ms frecuente de esta afeccin es el virus que provoca el resfro comn. Bacterias. Sustancias que molestan (irritan) los pulmones, lo que incluye: Humo de cigarrillos y otros productos de tabaco. Polvo y polen. Vapores de productos qumicos, gases o combustible quemado. Contaminacin del aire interior o exterior. Qu incrementa el riesgo? El sistema de defensa del cuerpo debilitado. Este tambin se denomina sistema inmunitario. Cualquier afeccin que afecte a los pulmones y la respiracin, como el asma. Cules son los signos o sntomas? Tos. Despedir Neomia Dear mucosidad transparente, amarilla o verde al toser. Emitir sonidos de silbidos agudos al respirar, ms a menudo al exhalar (sibilancias). Secrecin o congestin nasal. Exceso de mucosidad en los pulmones (congestin torcica). Falta de aire. Dolores [employer] cuerpo. Dolor de Advertising copywriter. Cmo se trata? La bronquitis aguda puede desaparecer con [employer], sin tratamiento. Su mdico puede recomendarle lo siguiente: Beba ms lquidos. Esto ayudar a diluir la mucosidad de modo que sea ms fcil expectorarla. Usar un dispositivo que Education administrator en los pulmones (inhalador). Utilizar un humidificador o vaporizador. Estas son mquinas que agregan agua al aire. Esto ayuda con la tos y con la respiracin deficiente. Tomar un medicamento que diluya la mucosidad y ayude a eliminarla de los pulmones. Tomar un medicamento que prevenga o detenga la  tos. No es frecuente tomar un antibitico para esta afeccin. Siga estas indicaciones en su casa:  Use los medicamentos de venta libre y los recetados solamente como se lo haya indicado el mdico. Use un Armed forces operational officer, un humidificador o un vaporizador tal como se lo haya indicado el mdico. [employer] cucharaditas (10 ml) de miel a la hora de [place]. Esto ayuda a disminuir la tos por la noche. Beba suficiente lquido para Radio producer pis (la orina) de color amarillo plido. No fume ni consuma ningn producto que contenga nicotina o tabaco. Si necesita ayuda para dejar de fumar, consulte al mdico. Descanse lo suficiente. Regrese a sus actividades normales cuando el [employer] diga que es [place]. Concurra a todas las visitas de seguimiento. Cmo se previene?  Lvese las manos frecuentemente con agua y jabn durante al menos 20 segundos. Use un desinfectante para manos si no dispone de [place] y [place]. Evite el contacto con personas que tienen sntomas de resfro. Trate de no llevarse las manos a la boca, la nariz o los ojos. Evite inhalar humo o vapores qumicos. Recuerde aplicarse la vacuna contra la gripe todos los [place]. Comunquese con un mdico si: Los sntomas no mejoran en el trmino de 2 semanas. Tiene dificultad para expulsar la mucosidad al toser. La tos lo mantiene despierto por la noche. Tiene fiebre. Solicite ayuda de inmediato si: Tose y [employer]. Siente dolor en el pecho. Sufre un episodio muy intenso de falta de aire. Se desmaya o se siente como si se fuera a desmayar. Tiene un dolor de cabeza muy intenso. La fiebre o los escalofros empeoran. Estos sntomas pueden Customer service manager. Solicite ayuda de inmediato. Comunquese con el servicio de emergencias de su localidad (911 en los  Estados [place]). No espere a ver si los sntomas desaparecen. No conduzca por sus propios medios [employer]. Resumen La bronquitis aguda es la inflamacin repentina de las vas  areas (bronquios) de los pulmones. En los adultos, la bronquitis aguda generalmente desaparece en 2 semanas. Beba ms lquidos. Esto ayudar a diluir la mucosidad de modo que sea ms fcil expectorarla. Use los medicamentos de venta libre y los recetados solamente como se lo haya indicado el mdico. Comunquese con un mdico si los sntomas no mejoran despus de 2 semanas de [place]. Esta informacin no tiene Theme park manager el consejo del mdico. Asegrese de hacerle al mdico cualquier pregunta que tenga. Document Revised: 05/01/2021 Document Reviewed: 05/01/2021 Elsevier Patient Education  2024 [employer].

## 2024-09-23 NOTE — Telephone Encounter (Signed)
 Copied from CRM [id number]. Topic: Clinical - Lab/Test Results >> Sep 23, 2024  2:39 PM Anairis L wrote: Reason for CRM: Patient is calling wanting to know her lung test results she took Monday.   Please give patient a call.  Spanish

## 2024-10-15 ENCOUNTER — Other Ambulatory Visit: Payer: Self-pay | Admitting: Emergency Medicine

## 2024-10-15 DIAGNOSIS — I10 Essential (primary) hypertension: Secondary | ICD-10-CM

## 2024-10-15 NOTE — Telephone Encounter (Signed)
 Copied from CRM [id number]. Topic: Clinical - Medication Refill >> Oct 15, 2024 11:09 AM Shereese L wrote: Medication: metoprolol  succinate (TOPROL -XL) 25 MG 24 hr tablet  Has the patient contacted their pharmacy? Yes (Agent: If no, request that the patient contact the pharmacy for the refill. If patient does not wish to contact the pharmacy document the reason why and proceed with request.) (Agent: If yes, when and what did the pharmacy advise?)  This is the patient's preferred pharmacy:  [id number] [address] [facility] - [address] [place] [address] Phone: [phone] Fax: [phone]    Is this the correct pharmacy for this prescription? Yes If no, delete pharmacy and type the correct one.   Has the prescription been filled recently? Yes  Is the patient out of the medication? Yes  Has the patient been seen for an appointment in the last year OR does the patient have an upcoming appointment? Yes  Can we respond through MyChart? Yes  Agent: Please be advised that Rx refills may take up to 3 business days. We ask that you follow-up with your pharmacy.

## 2024-10-15 NOTE — Telephone Encounter (Signed)
 Copied from CRM [id number]. Topic: Clinical - Medication Question >> Oct 15, 2024 11:05 AM Emily [name] wrote: Reason for CRM: Patient called in and stated that she needs a refill on medication metoprolol  succinate (TOPROL -XL) 25 MG 24 hr tablet.. She stated that 25mg  wasn't enough to get her pressure under control so she started taking 50mg . Now she is running low and needs a refill on the medication and she had a 90 supply

## 2024-10-20 ENCOUNTER — Ambulatory Visit: Admitting: Emergency Medicine

## 2024-10-20 ENCOUNTER — Encounter: Payer: Self-pay | Admitting: Emergency Medicine

## 2024-10-20 VITALS — BP 130/82 | HR 70 | Temp 98.9°F | Ht 66.0 in | Wt 188.4 lb

## 2024-10-20 DIAGNOSIS — E785 Hyperlipidemia, unspecified: Secondary | ICD-10-CM

## 2024-10-20 DIAGNOSIS — K2100 Gastro-esophageal reflux disease with esophagitis, without bleeding: Secondary | ICD-10-CM | POA: Diagnosis not present

## 2024-10-20 DIAGNOSIS — R7303 Prediabetes: Secondary | ICD-10-CM

## 2024-10-20 DIAGNOSIS — I10 Essential (primary) hypertension: Secondary | ICD-10-CM

## 2024-10-20 DIAGNOSIS — G894 Chronic pain syndrome: Secondary | ICD-10-CM

## 2024-10-20 DIAGNOSIS — M810 Age-related osteoporosis without current pathological fracture: Secondary | ICD-10-CM

## 2024-10-20 LAB — LIPID PANEL
Cholesterol: 165 mg/dL (ref 0–200)
HDL: 54.6 mg/dL (ref >39.00)
LDL Cholesterol: 90 mg/dL (ref 0–99)
NonHDL: 110.12
Total CHOL/HDL Ratio: 3
Triglycerides: 99 mg/dL (ref 0.0–149.0)
VLDL: 19.8 mg/dL (ref 0.0–40.0)

## 2024-10-20 MED ORDER — METOPROLOL SUCCINATE ER 50 MG PO TB24: 50.0000 mg | ORAL_TABLET | Freq: Every day | ORAL | 3 refills | Status: AC

## 2024-10-20 NOTE — Assessment & Plan Note (Signed)
 Chronic stable condition Diet and nutrition discussed Has developed allergies to greasy foods Continue rosuvastatin 20 mg daily

## 2024-10-20 NOTE — Assessment & Plan Note (Signed)
 Persistent symptoms despite PPI therapy GI referral placed today

## 2024-10-20 NOTE — Assessment & Plan Note (Signed)
Diet and nutrition discussed Benefits of exercise discussed

## 2024-10-20 NOTE — Assessment & Plan Note (Addendum)
 BP Readings from Last 3 Encounters:  10/20/24 130/82  09/21/24 128/76  08/18/24 124/78  Well-controlled hypertension Continue lisinopril  40 mg, metoprolol  succinate 50 mg Cardiovascular risks associated with hypertension discussed Continue monitoring blood pressure readings at home for the next couple weeks and contact the office if numbers persistently abnormal

## 2024-10-20 NOTE — Assessment & Plan Note (Signed)
 Stable.  Tolerating alendronate  70 mg milligrams weekly

## 2024-10-20 NOTE — Patient Instructions (Signed)
 Mantenimiento de la salud despus de los 65 aos de edad Health Maintenance After Age 67 Despus de los 65 aos de edad, corre un riesgo mayor de Film/video editor enfermedades e infecciones a largo plazo, como tambin de sufrir lesiones por cadas. Las cadas son la causa principal de las fracturas de huesos y lesiones en la cabeza de personas mayores de 65 aos de edad. Recibir cuidados preventivos de forma regular puede ayudarlo a mantenerse saludable y en buen [place]. Los cuidados preventivos incluyen realizarse anlisis de forma regular y Forensic psychologist en el estilo de vida segn las recomendaciones del mdico. Converse con el mdico sobre lo siguiente: Las pruebas de deteccin y los anlisis que debe International aid/development worker. Una prueba de deteccin es un estudio que se para Engineer, manufacturing la presencia de una enfermedad cuando no tiene sntomas. Un plan de dieta y ejercicios adecuado para usted. Qu debo saber sobre las pruebas de deteccin y los anlisis para prevenir cadas? Realizarse pruebas de deteccin y [employer] es la mejor manera de Engineer, manufacturing un problema de salud de forma temprana. El diagnstico y tratamiento tempranos le brindan la mejor oportunidad de Chief Operating Officer las afecciones mdicas que son comunes despus de los 65 aos de edad. Ciertas afecciones y elecciones de estilo de vida pueden hacer que sea ms propenso a sufrir Engineer, site. El mdico puede recomendarle lo siguiente: Controles regulares de la visin. Una visin deficiente y afecciones como las cataratas pueden hacer que sea ms propenso a sufrir Engineer, site. Si usa  lentes, asegrese de obtener una receta actualizada si su visin cambia. Revisin de medicamentos. Revise regularmente con el mdico todos los medicamentos que toma, incluidos los medicamentos de [place]. Consulte al [employer] efectos secundarios que pueden hacer que sea ms propenso a sufrir Engineer, site. Informe al mdico si alguno de los medicamentos que toma lo hace sentir mareado o  somnoliento. Controles de fuerza y equilibrio. El mdico puede recomendar ciertos estudios para controlar su fuerza y equilibrio al estar de pie, al caminar o al cambiar de posicin. Examen de los pies. El dolor y Materials engineer en los pies, como tambin no utilizar el calzado adecuado, pueden hacer que sea ms propenso a sufrir Engineer, site. Pruebas de deteccin, que incluyen las siguientes: Pruebas de deteccin para la osteoporosis. La osteoporosis es una afeccin que hace que los huesos se tornen ms dbiles y se quiebren con ms facilidad. Pruebas de deteccin para la presin arterial. Los cambios en la presin arterial y los medicamentos para Chief Operating Officer la presin arterial pueden hacerlo sentir mareado. Prueba de deteccin de la depresin. Es ms probable que sufra una cada si tiene miedo a caerse, se siente deprimido o se siente incapaz de Probation officer. Prueba de deteccin de consumo de alcohol. Beber demasiado alcohol puede afectar su equilibrio y puede hacer que sea ms propenso a sufrir Engineer, site. Siga estas indicaciones en su casa: Estilo de vida No beba alcohol si: Su mdico le indica no hacerlo. Si bebe alcohol: Limite la cantidad que bebe a lo siguiente: De 0 a 1 medida por da para las mujeres. De 0 a 2 medidas por da para los hombres. Sepa cunta cantidad de alcohol hay en las bebidas que toma. En los [address], una medida equivale a una botella de cerveza de 12 oz (355 ml), un vaso de vino de 5 oz (148 ml) o un vaso de una bebida alcohlica de alta graduacin de 1 oz (44 ml). No consuma ningn producto que  contenga nicotina o tabaco. Estos productos incluyen cigarrillos, tabaco para mascar y aparatos de vapeo, como los cigarrillos electrnicos. Si necesita ayuda para dejar de consumir estos productos, consulte al [employer]. Actividad  Siga un programa de ejercicio regular para mantenerse en forma. Esto lo ayudar a Radio producer equilibrio. Consulte al  mdico qu tipos de ejercicios son adecuados para usted. Si necesita un bastn o un andador, selo segn las recomendaciones del mdico. Utilice calzado con buen apoyo y suela antideslizante. Seguridad  Retire los [employer] puedan causar tropiezos tales como alfombras, cables u obstculos. Instale equipos de seguridad, como barras para sostn en los baos y barandas de seguridad en las escaleras. Mantenga las habitaciones y los pasillos bien iluminados. Indicaciones generales Hable con el mdico sobre sus riesgos de sufrir una cada. Infrmele a su mdico si: Se cae. Asegrese de informarle a su mdico acerca de todas las cadas, incluso aquellas que parecen ser [employer]. Se siente mareado, cansado (tiene fatiga) o siente que pierde el equilibrio. Use los medicamentos de venta libre y los recetados solamente como se lo haya indicado el mdico. Estos incluyen suplementos. Siga una dieta sana y [place] un peso saludable. Una dieta saludable incluye productos lcteos descremados, carnes bajas en contenido de grasa ([place]), fibra de granos enteros, frijoles y [place] frutas y verduras. Mantngase al da con las vacunas. Realcese los estudios de rutina de la salud, dentales y de Wellsite geologist. Resumen Tener un estilo de vida saludable y recibir cuidados preventivos pueden ayudar a Research scientist (physical sciences) salud y el bienestar despus de los 65 aos de [place]. Realizarse pruebas de deteccin y anlisis es la mejor manera de Engineer, manufacturing un problema de salud de forma temprana y ayudarlo a Automotive engineer una cada. El diagnstico y tratamiento tempranos le brindan la mejor oportunidad de Chief Operating Officer las afecciones mdicas ms comunes en las personas mayores de 65 aos de edad. Las cadas son la causa principal de las fracturas de huesos y lesiones en la cabeza de personas mayores de 65 aos de edad. Tome precauciones para evitar una cada en su casa. Trabaje con el mdico para saber qu cambios que puede hacer para mejorar su salud y  [place], y para prevenir las cadas. Esta informacin no tiene Theme park manager el consejo del mdico. Asegrese de hacerle al mdico cualquier pregunta que tenga. Document Revised: 05/17/2021 Document Reviewed: 05/17/2021 Elsevier Patient Education  2024 [employer].

## 2024-10-20 NOTE — Assessment & Plan Note (Signed)
 Clinically stable Cymbalta  helping Continue 30 mg daily

## 2024-10-20 NOTE — Progress Notes (Signed)
 Emily [name] 68 y.o.   Chief Complaint  Patient presents with   Follow-up    States metoprolol  25mg  isnt helping much any more. Stated she started doing double dose of 25 and now needs a refill. Asking for the 50mg  to be sent in since she is out of medications now.     HISTORY OF PRESENT ILLNESS: This is a 68 y.o. female here for follow-up of hypertension. Back to taking 50 mg of metoprolol  succinate.  Needs a new prescription History of dyslipidemia.  Needs lipid panel done today Sees cardiologist on a regular basis History of GERD.  Having persistent symptoms despite PPI treatment.  Requesting GI referral No other complaints or medical concerns today. BP Readings from Last 3 Encounters:  09/21/24 128/76  08/18/24 124/78  06/04/24 126/86     HPI   Prior to Admission medications   Medication Sig Start Date End Date Taking? Authorizing Provider  acetic acid -hydrocortisone  (VOSOL -HC) OTIC solution Place 3 drops into the left ear 3 (three) times daily. 05/21/23  Yes Lakota Markgraf, Emil Schanz, MD  alendronate  (FOSAMAX ) 70 MG tablet Take 1 tablet (70 mg total) by mouth every 7 (seven) days. Take with a full glass of water on an empty stomach. 12/10/23  Yes Jeryl Wilbourn, Emil Schanz, MD  ALPRAZolam  (XANAX ) 0.5 MG tablet TAKE 1 TABLET BY MOUTH EVERY DAY AS NEEDED FOR ANXIETY 08/27/23  Yes Damariz Paganelli, Emil Schanz, MD  Ascorbic Acid (VITAMIN C) 1000 MG tablet Take 1,000 mg by mouth daily.   Yes [provider]  azithromycin  (ZITHROMAX ) 250 MG tablet Sig as indicated 09/21/24  Yes Raine Elsass, Emil Schanz, MD  benzonatate  (TESSALON ) 200 MG capsule Take 1 capsule (200 mg total) by mouth 2 (two) times daily as needed for cough. 09/21/24  Yes Purcell Emil Schanz, MD  CALCIUM  PO Take 1 tablet by mouth daily.    Yes [provider]  Cholecalciferol (VITAMIN D3 PO) Take 125 mg by mouth daily.   Yes [provider]  ciclopirox  (PENLAC ) 8 % solution Apply topically at bedtime.  Apply over nail and surrounding skin. Apply daily over previous coat. After seven (7) days, may remove with alcohol and continue cycle. 11/12/22  Yes McDonald, Juliene SAUNDERS, DPM  Coenzyme Q10 (CO Q-10 PO) Take 18 mg by mouth daily.   Yes [provider]  DULoxetine  (CYMBALTA ) 30 MG capsule TAKE 1 CAPSULE BY MOUTH EVERY DAY 09/11/24  Yes Velda Wendt, Emil Schanz, MD  esomeprazole  (NEXIUM ) 40 MG capsule Take 40 mg by mouth daily at 12 noon.   Yes [provider]  furosemide  (LASIX ) 20 MG tablet Take 1 tablet (20 mg total) by mouth daily. 03/12/24  Yes Fountain, Madison L, NP  lisinopril  (ZESTRIL ) 40 MG tablet Take 1 tablet (40 mg total) by mouth daily. 08/18/24  Yes Markevion Lattin, Emil Schanz, MD  metoprolol  succinate (TOPROL -XL) 25 MG 24 hr tablet Take 1 tablet (25 mg total) by mouth daily. 08/18/24  Yes Abiha Lukehart, Emil Schanz, MD  rosuvastatin  (CRESTOR ) 40 MG tablet Take 1 tablet (40 mg total) by mouth daily. 08/18/24  Yes Aneesha Holloran, Emil Schanz, MD  Turmeric (QC TUMERIC COMPLEX PO) Take by mouth daily. NATURAL SUPPLEMENT   Yes [provider]  NIFEdipine  (ADALAT  CC) 30 MG 24 hr tablet     [provider]    Allergies  Allergen Reactions   Ibuprofen    Aspirin Hives   Atorvastatin      Yellow fingernails   Nsaids Hives    Patient Active Problem  List   Diagnosis Date Noted   Lower respiratory infection 09/21/2024   Acute cough 09/21/2024   Flu-like symptoms 09/21/2024   Chronic anemia 08/18/2024   Chronic pain syndrome 08/18/2024   Coronary artery calcification 06/03/2024   Chronic venous insufficiency 04/23/2024   Age-related osteoporosis without current pathological fracture 12/10/2023   Prediabetes 02/12/2022   History of arthritis 08/15/2020   History of gastroesophageal reflux (GERD) 08/15/2020   Carotid artery disease 12/19/2018   Bilateral carotid artery stenosis, R- 40-59%, L 1-39% 11/2017 03/25/2018   Dyslipidemia 03/25/2018   GERD (gastroesophageal reflux  disease) 04/12/2016   Chronic high back pain 09/24/2015   Nonspecific chest pain 08/11/2014   Essential hypertension 10/03/2012    Past Medical History:  Diagnosis Date   Abdominal pain, chronic, epigastric 11/02/2015   Started amitriptyline  at hospitalization 04/13/2016 EGD x 2 negative Also w/ chronic chest wall and back pain Would try not to work up further    Anxiety    Arthritis    BACK,KNEES   Beta thalassemia minor    diagnosed by hematology Dr. ALONSO Chad, also with sickle cell trait   Blood transfusion without reported diagnosis    LONG TIME AGO   Chronic high back pain 09/24/2015   Colon polyps    GERD (gastroesophageal reflux disease)    Hyperlipidemia    Hypertension    Sickle cell trait    diagnosed by hematology Dr. Chad, coexsisting with beta-thalassemia minor    Past Surgical History:  Procedure Laterality Date   ABDOMINAL HYSTERECTOMY     1998   COLONOSCOPY     ESOPHAGOGASTRODUODENOSCOPY     ESOPHAGOGASTRODUODENOSCOPY [name] 04/13/2016   Procedure: ESOPHAGOGASTRODUODENOSCOPY (EGD);  Surgeon: Lupita FORBES Commander, MD;  Location: [facility];  Service: Endoscopy;  Laterality: [name];    Social History   Socioeconomic History   Marital status: Married    Spouse name: Not on file   Number of children: 3   Years of education: Not on file   Highest education level: Not on file  Occupational History   Not on file  Tobacco Use   Smoking status: Never    Passive exposure: Never   Smokeless tobacco: Never  Vaping Use   Vaping status: Never Used  Substance and Sexual Activity   Alcohol use: No    Alcohol/week: 0.0 standard drinks of alcohol   Drug use: No   Sexual activity: Yes    Comment: 1st intercourse- 36, partner-1, married- 42 yrs   Other Topics Concern   Not on file  Social History Narrative   Lives with husband and works at [employer]   Social Drivers of Corporate Investment Banker Strain: Not on file  Food Insecurity: Not on file  Transportation  Needs: Not on file  Physical Activity: Not on file  Stress: Not on file  Social Connections: Not on file  Intimate Partner Violence: Not on file    Family History  Problem Relation Age of Onset   Aneurysm Mother    Diabetes Father    Colon polyps Brother    Colon cancer Brother    Liver cancer Brother    Colon cancer Nephew    Esophageal cancer Neg Hx    Stomach cancer Neg Hx    Pancreatic cancer Neg Hx    Rectal cancer Neg Hx    Breast cancer Neg Hx      Review of Systems  Constitutional: Negative.  Negative for chills and fever.  HENT: Negative.  Negative  for congestion and sore throat.   Respiratory: Negative.  Negative for cough and shortness of breath.   Cardiovascular: Negative.  Negative for chest pain and palpitations.  Gastrointestinal:  Positive for heartburn and nausea. Negative for abdominal pain, blood in stool, melena and vomiting.  Genitourinary: Negative.  Negative for dysuria and hematuria.  Skin: Negative.  Negative for rash.  Neurological:  Negative for dizziness and headaches.  All other systems reviewed and are negative.   Today's Vitals   10/20/24 1403  BP: 130/82  Pulse: 70  Temp: 98.9 F (37.2 C)  TempSrc: Oral  SpO2: 96%  Weight: 188 lb 6.4 oz (85.5 kg)  Height: 5' 6 (1.676 m)   Body mass index is 30.41 kg/m.   Physical Exam Vitals reviewed.  Constitutional:      Appearance: Normal appearance.  HENT:     Head: Normocephalic.     Mouth/Throat:     Mouth: Mucous membranes are moist.     Pharynx: Oropharynx is clear.  Eyes:     Extraocular Movements: Extraocular movements intact.     Pupils: Pupils are equal, round, and reactive to light.  Cardiovascular:     Rate and Rhythm: Normal rate and regular rhythm.     Heart sounds: Murmur heard.  Pulmonary:     Effort: Pulmonary effort is normal.     Breath sounds: Normal breath sounds.  Abdominal:     Palpations: Abdomen is soft.     Tenderness: There is no abdominal tenderness.   Musculoskeletal:     Cervical back: No tenderness.  Lymphadenopathy:     Cervical: No cervical adenopathy.  Skin:    General: Skin is warm and dry.     Capillary Refill: Capillary refill takes less than 2 seconds.  Neurological:     General: No focal deficit present.     Mental Status: She is alert and oriented to person, place, and time.  Psychiatric:        Mood and Affect: Mood normal.        Behavior: Behavior normal.      ASSESSMENT & PLAN: A total of 43 minutes was spent with the patient and counseling/coordination of care regarding preparing for this visit, review of most recent office visit notes, review of multiple chronic medical conditions and their management, review of all medications and changes made, review of most recent bloodwork results, review of health maintenance items, education on nutrition, prognosis, documentation, and need for follow up.   Problem List Items Addressed This Visit       Cardiovascular and Mediastinum   Essential hypertension - Primary   BP Readings from Last 3 Encounters:  10/20/24 130/82  09/21/24 128/76  08/18/24 124/78  Well-controlled hypertension Continue lisinopril  40 mg, metoprolol  succinate 50 mg Cardiovascular risks associated with hypertension discussed Continue monitoring blood pressure readings at home for the next couple weeks and contact the office if numbers persistently abnormal         Relevant Medications   metoprolol  succinate (TOPROL -XL) 50 MG 24 hr tablet     Digestive   GERD (gastroesophageal reflux disease)   Persistent symptoms despite PPI therapy GI referral placed today      Relevant Orders   Ambulatory referral to Gastroenterology     Musculoskeletal and Integument   Age-related osteoporosis without current pathological fracture   Stable.  Tolerating alendronate  70 mg milligrams weekly        Other   Dyslipidemia   Chronic stable condition Diet and  nutrition discussed Has developed  allergies to greasy foods Continue rosuvastatin  20 mg daily      Relevant Orders   Lipid panel (Completed)   Prediabetes   Diet and nutrition discussed Benefits of exercise discussed        Chronic pain syndrome   Clinically stable Cymbalta  helping Continue 30 mg daily      Patient Instructions  Mantenimiento de la salud despus de los 65 aos de edad Health Maintenance After Age 77 Despus de los 65 aos de edad, corre un riesgo mayor de film/video editor enfermedades e infecciones a largo plazo, como tambin de sufrir lesiones por cadas. Las cadas son la causa principal de las fracturas de huesos y lesiones en la cabeza de personas mayores de 65 aos de edad. Recibir cuidados preventivos de forma regular puede ayudarlo a mantenerse saludable y en [facility]. Los cuidados preventivos incluyen realizarse anlisis de forma regular y forensic psychologist en el estilo de vida segn las recomendaciones del mdico. Converse con el mdico sobre lo siguiente: Las pruebas de deteccin y los anlisis que debe international aid/development worker. Una prueba de deteccin es un estudio que se para engineer, manufacturing la presencia de una enfermedad cuando no tiene sntomas. Un plan de dieta y ejercicios adecuado para usted. Qu debo saber sobre las pruebas de deteccin y los anlisis para prevenir cadas? Realizarse pruebas de deteccin y ar[employer] corporation es la mejor manera de engineer, manufacturing un problema de salud de forma temprana. El diagnstico y tratamiento tempranos le brindan la mejor oportunidad de chief operating officer las afecciones mdicas que son comunes despus de los 65 aos de edad. Ciertas afecciones y elecciones de estilo de vida pueden hacer que sea ms propenso a sufrir engineer, site. El mdico puede recomendarle lo siguiente: Controles regulares de la visin. Una visin deficiente y afecciones como las cataratas pueden hacer que sea ms propenso a sufrir engineer, site. Si usa  lentes, asegrese de obtener una receta actualizada si su visin  cambia. Revisin de medicamentos. Revise regularmente con el mdico todos los medicamentos que toma, incluidos los medicamentos de [place]. Consulte al enterprise products efectos secundarios que pueden hacer que sea ms propenso a sufrir engineer, site. Informe al mdico si alguno de los medicamentos que toma lo hace sentir mareado o somnoliento. Controles de fuerza y equilibrio. El mdico puede recomendar ciertos estudios para controlar su fuerza y equilibrio al estar de pie, al caminar o al cambiar de posicin. Examen de los pies. El dolor y materials engineer en los pies, como tambin no utilizar el calzado adecuado, pueden hacer que sea ms propenso a sufrir engineer, site. Pruebas de deteccin, que incluyen las siguientes: Pruebas de deteccin para la osteoporosis. La osteoporosis es una afeccin que hace que los huesos se tornen ms dbiles y se quiebren con ms facilidad. Pruebas de deteccin para la presin arterial. Los cambios en la presin arterial y los medicamentos para chief operating officer la presin arterial pueden hacerlo sentir mareado. Prueba de deteccin de la depresin. Es ms probable que sufra una cada si tiene miedo a caerse, se siente deprimido o se siente incapaz de probation officer. Prueba de deteccin de consumo de alcohol. Beber demasiado alcohol puede afectar su equilibrio y puede hacer que sea ms propenso a sufrir engineer, site. Siga estas indicaciones en su casa: Estilo de vida No beba alcohol si: Su mdico le indica no hacerlo. Si bebe alcohol: Limite la cantidad que bebe a lo siguiente: De 0 a 1 medida por da para las mujeres.  De 0 a 2 medidas por da para los hombres. Sepa cunta cantidad de alcohol hay en las bebidas que toma. En los [address], una medida equivale a una botella de cerveza de 12 oz (355 ml), un vaso de vino de 5 oz (148 ml) o un vaso de una bebida alcohlica de alta graduacin de 1 oz (44 ml). No consuma ningn producto que contenga nicotina o  tabaco. Estos productos incluyen cigarrillos, tabaco para theatre manager y aparatos de vapeo, como los cigarrillos electrnicos. Si necesita ayuda para dejar de consumir estos productos, consulte al american express. Actividad  Siga un programa de ejercicio regular para mantenerse en forma. Esto lo ayudar a radio producer equilibrio. Consulte al mdico qu tipos de ejercicios son adecuados para usted. Si necesita un bastn o un andador, selo segn las recomendaciones del mdico. Utilice calzado con buen apoyo y suela antideslizante. Seguridad  Retire los autonation puedan causar tropiezos tales como alfombras, cables u obstculos. Instale equipos de seguridad, como barras para sostn en los baos y barandas de seguridad en las escaleras. Mantenga las habitaciones y los pasillos bien iluminados. Indicaciones generales Hable con el mdico sobre sus riesgos de sufrir una cada. Infrmele a su mdico si: Se cae. Asegrese de informarle a su mdico acerca de todas las cadas, incluso aquellas que parecen ser liberty global. Se siente mareado, cansado (tiene fatiga) o siente que pierde el equilibrio. Use los medicamentos de venta libre y los recetados solamente como se lo haya indicado el mdico. Estos incluyen suplementos. Siga una dieta sana y [place] un peso saludable. Una dieta saludable incluye productos lcteos descremados, carnes bajas en contenido de grasa ([place]), fibra de granos enteros, frijoles y [place] frutas y verduras. Mantngase al da con las vacunas. Realcese los estudios de rutina de la salud, dentales y de wellsite geologist. Resumen Tener un estilo de vida saludable y recibir cuidados preventivos pueden ayudar a research scientist (physical sciences) salud y el bienestar despus de los 65 aos de [place]. Realizarse pruebas de deteccin y anlisis es la mejor manera de engineer, manufacturing un problema de salud de forma temprana y ayudarlo a automotive engineer una cada. El diagnstico y tratamiento tempranos le brindan la mejor oportunidad de chief operating officer las  afecciones mdicas ms comunes en las personas mayores de 65 aos de edad. Las cadas son la causa principal de las fracturas de huesos y lesiones en la cabeza de personas mayores de 65 aos de edad. Tome precauciones para evitar una cada en su casa. Trabaje con el mdico para saber qu cambios que puede hacer para mejorar su salud y [place], y para prevenir las cadas. Esta informacin no tiene theme park manager el consejo del mdico. Asegrese de hacerle al mdico cualquier pregunta que tenga. Document Revised: 05/17/2021 Document Reviewed: 05/17/2021 Elsevier Patient Education  2024 Elsevier Inc.     Emil Schaumann, MD [facility]

## 2024-10-29 ENCOUNTER — Telehealth: Payer: Self-pay

## 2024-10-29 NOTE — Telephone Encounter (Signed)
 Copied from CRM #[id number]. Topic: Clinical - Lab/Test Results >> Oct 29, 2024  2:14 PM Dedra B wrote: Reason for CRM: Pt calling regarding lab results. Pls call pt. >> Oct 29, 2024  2:32 PM Dedra B wrote: Call interpreted by: [facility] ID# [phone]

## 2024-10-29 NOTE — Telephone Encounter (Signed)
 Results have not be seen by provider will call back once they have

## 2024-11-03 ENCOUNTER — Telehealth: Payer: Self-pay | Admitting: Cardiology

## 2024-11-03 NOTE — Telephone Encounter (Signed)
 Unable to leave message to call back, will try again at later time.

## 2024-11-03 NOTE — Telephone Encounter (Signed)
Patient called to follow-up on test results. 

## 2024-11-12 ENCOUNTER — Ambulatory Visit: Payer: Self-pay

## 2024-11-12 NOTE — Telephone Encounter (Signed)
 FYI Only or Action Required?: Action required by provider: request for appointment.  Patient was last seen in primary care on 10/20/2024 by Purcell Emil Schanz, MD.  Called Nurse Triage reporting Dysuria.  Symptoms began several days ago.  Interventions attempted: Rest, hydration, or home remedies.  Symptoms are: gradually worsening.  Triage Disposition: See HCP Within 4 Hours (Or PCP Triage)  Patient/caregiver understands and will follow disposition?: No, wishes to speak with PCP   Copied from CRM #[id number]. Topic: Clinical - Red Word Triage >> Nov 12, 2024 12:24 PM Alfonso ORN wrote: Red Word that prompted transfer to Nurse Triage: urine foamy and pain with urination Reason for Disposition  Side (flank) or lower back pain present  Answer Assessment - Initial Assessment Questions Spanish interpreter assisted.   No appointments are available in clinic until next week, offered appointment at alternate office today but she refuses, refusing urgent care. She would to hear back from pcp today for work in visit or if ok to wait for next available appointment next week. Please follow up    1. SYMPTOM: What's the main symptom you're concerned about? (e.g., frequency, incontinence)     Foamy urine and pain 2. ONSET: When did the  dysuria  start?     One week  3. PAIN: Is there any pain? If Yes, ask: How bad is it? (Scale: 1-10; mild, moderate, severe)     Dysuria-mild 4. CAUSE: What do you think is causing the symptoms?     uti 5. OTHER SYMPTOMS: Do you have any other symptoms? (e.g., blood in urine, fever, flank pain, pain with urination)     Flank pain-mild 6. PREGNANCY: Is there any chance you are pregnant? When was your last menstrual period?  Protocols used: Urinary Symptoms-A-AH

## 2024-11-12 NOTE — Telephone Encounter (Signed)
 Pt has been scheduled on the 25th

## 2024-11-16 NOTE — Telephone Encounter (Signed)
 Unable to determine what results pt was calling about.  No reason testing ordered from this office.  Pt is scheduled to be seen 11/25 by Dr migual Jose Sagardia.

## 2024-11-17 ENCOUNTER — Encounter: Payer: Self-pay | Admitting: Emergency Medicine

## 2024-11-17 ENCOUNTER — Ambulatory Visit: Admitting: Emergency Medicine

## 2024-11-17 VITALS — BP 120/82 | HR 73 | Temp 98.5°F | Ht 66.0 in | Wt 189.0 lb

## 2024-11-17 DIAGNOSIS — R7303 Prediabetes: Secondary | ICD-10-CM | POA: Diagnosis not present

## 2024-11-17 DIAGNOSIS — R82998 Other abnormal findings in urine: Secondary | ICD-10-CM | POA: Diagnosis not present

## 2024-11-17 LAB — URINALYSIS
Bilirubin Urine: NEGATIVE
Hgb urine dipstick: NEGATIVE
Leukocytes,Ua: NEGATIVE
Nitrite: NEGATIVE
Specific Gravity, Urine: 1.02 (ref 1.000–1.030)
Total Protein, Urine: NEGATIVE
Urine Glucose: NEGATIVE
Urobilinogen, UA: 0.2 (ref 0.0–1.0)
pH: 6 (ref 5.0–8.0)

## 2024-11-17 LAB — COMPREHENSIVE METABOLIC PANEL WITH GFR
ALT: 17 U/L (ref 0–35)
AST: 16 U/L (ref 0–37)
Albumin: 4 g/dL (ref 3.5–5.2)
Alkaline Phosphatase: 98 U/L (ref 39–117)
BUN: 16 mg/dL (ref 6–23)
CO2: 32 meq/L (ref 19–32)
Calcium: 10 mg/dL (ref 8.4–10.5)
Chloride: 108 meq/L (ref 96–112)
Creatinine, Ser: 0.59 mg/dL (ref 0.40–1.20)
GFR: 93.4 mL/min (ref >60.00)
Glucose, Bld: 100 mg/dL — ABNORMAL HIGH (ref 70–99)
Potassium: 4.2 meq/L (ref 3.5–5.1)
Sodium: 141 meq/L (ref 135–145)
Total Bilirubin: 0.3 mg/dL (ref 0.2–1.2)
Total Protein: 7.1 g/dL (ref 6.0–8.3)

## 2024-11-17 LAB — CBC WITH DIFFERENTIAL/PLATELET
Basophils Absolute: 0.1 K/uL (ref 0.0–0.1)
Basophils Relative: 0.7 % (ref 0.0–3.0)
Eosinophils Absolute: 0.3 K/uL (ref 0.0–0.7)
Eosinophils Relative: 4.8 % (ref 0.0–5.0)
HCT: 36.2 % (ref 36.0–46.0)
Hemoglobin: 11.8 g/dL — ABNORMAL LOW (ref 12.0–15.0)
Lymphocytes Relative: 33.9 % (ref 12.0–46.0)
Lymphs Abs: 2.4 K/uL (ref 0.7–4.0)
MCHC: 32.6 g/dL (ref 30.0–36.0)
MCV: 75.9 fl — ABNORMAL LOW (ref 78.0–100.0)
Monocytes Absolute: 0.6 K/uL (ref 0.1–1.0)
Monocytes Relative: 8 % (ref 3.0–12.0)
Neutro Abs: 3.8 K/uL (ref 1.4–7.7)
Neutrophils Relative %: 52.6 % (ref 43.0–77.0)
Platelets: 252 K/uL (ref 150.0–400.0)
RBC: 4.77 Mil/uL (ref 3.87–5.11)
RDW: 16 % — ABNORMAL HIGH (ref 11.5–15.5)
WBC: 7.2 K/uL (ref 4.0–10.5)

## 2024-11-17 NOTE — Assessment & Plan Note (Signed)
 Clinically stable.  No red flag signs or symptoms Recent life insurance health evaluation showed protein in the urine Was advised to follow-up with PCP Recommend blood work today and urinalysis Diet and nutrition discussed Follow-up in 3 months

## 2024-11-17 NOTE — Progress Notes (Signed)
 Emily [name] 68 y.o.   Chief Complaint  Patient presents with   Urinary Tract Infection    Patient stats that started about 2-3 days ago     HISTORY OF PRESENT ILLNESS: This is a 68 y.o. female complaining of foaming the urine that started 2 to 3 days ago Denies dysuria or hematuria No other associated symptoms No other complaints or medical concerns today.  Urinary Tract Infection  Pertinent negatives include no chills, flank pain, frequency, hematuria, nausea or vomiting.     Prior to Admission medications   Medication Sig Start Date End Date Taking? Authorizing Provider  acetic acid -hydrocortisone  (VOSOL -HC) OTIC solution Place 3 drops into the left ear 3 (three) times daily. 05/21/23   Bobbe Quilter Jose, MD  alendronate  (FOSAMAX ) 70 MG tablet Take 1 tablet (70 mg total) by mouth every 7 (seven) days. Take with a full glass of water on an empty stomach. 12/10/23   Purcell Emil Schanz, MD  ALPRAZolam  (XANAX ) 0.5 MG tablet TAKE 1 TABLET BY MOUTH EVERY DAY AS NEEDED FOR ANXIETY 08/27/23   Purcell Emil Schanz, MD  Ascorbic Acid (VITAMIN C) 1000 MG tablet Take 1,000 mg by mouth daily.    [provider]  azithromycin  (ZITHROMAX ) 250 MG tablet Sig as indicated 09/21/24   Emmory Solivan Jose, MD  benzonatate  (TESSALON ) 200 MG capsule Take 1 capsule (200 mg total) by mouth 2 (two) times daily as needed for cough. 09/21/24   Purcell Emil Schanz, MD  CALCIUM  PO Take 1 tablet by mouth daily.     [provider]  Cholecalciferol (VITAMIN D3 PO) Take 125 mg by mouth daily.    [provider]  ciclopirox  (PENLAC ) 8 % solution Apply topically at bedtime. Apply over nail and surrounding skin. Apply daily over previous coat. After seven (7) days, may remove with alcohol and continue cycle. 11/12/22   Silva Juliene SAUNDERS, DPM  Coenzyme Q10 (CO Q-10 PO) Take 18 mg by mouth daily.    [provider]  DULoxetine  (CYMBALTA ) 30 MG capsule TAKE 1 CAPSULE  BY MOUTH EVERY DAY 09/11/24   Ziaire Hagos Jose, MD  esomeprazole  (NEXIUM ) 40 MG capsule Take 40 mg by mouth daily at 12 noon.    [provider]  furosemide  (LASIX ) 20 MG tablet Take 1 tablet (20 mg total) by mouth daily. 03/12/24   Rana Lum CROME, NP  lisinopril  (ZESTRIL ) 40 MG tablet Take 1 tablet (40 mg total) by mouth daily. 08/18/24   Purcell Emil Schanz, MD  metoprolol  succinate (TOPROL -XL) 25 MG 24 hr tablet Take 1 tablet (25 mg total) by mouth daily. 08/18/24   Purcell Emil Schanz, MD  metoprolol  succinate (TOPROL -XL) 50 MG 24 hr tablet Take 1 tablet (50 mg total) by mouth daily. Take with or immediately following a meal. 10/20/24   Mahira Petras, Emil Schanz, MD  rosuvastatin  (CRESTOR ) 40 MG tablet Take 1 tablet (40 mg total) by mouth daily. 08/18/24   Purcell Emil Schanz, MD  Turmeric (QC TUMERIC COMPLEX PO) Take by mouth daily. NATURAL SUPPLEMENT    [provider]    Allergies  Allergen Reactions   Ibuprofen    Aspirin Hives   Atorvastatin      Yellow fingernails   Nsaids Hives    Patient Active Problem List   Diagnosis Date Noted   Chronic anemia 08/18/2024   Chronic pain syndrome 08/18/2024   Coronary artery calcification 06/03/2024   Chronic venous insufficiency 04/23/2024   Age-related osteoporosis without current pathological fracture 12/10/2023  Prediabetes 02/12/2022   History of arthritis 08/15/2020   History of gastroesophageal reflux (GERD) 08/15/2020   Carotid artery disease 12/19/2018   Bilateral carotid artery stenosis, R- 40-59%, L 1-39% 11/2017 03/25/2018   Dyslipidemia 03/25/2018   GERD (gastroesophageal reflux disease) 04/12/2016   Chronic high back pain 09/24/2015   Essential hypertension 10/03/2012    Past Medical History:  Diagnosis Date   Abdominal pain, chronic, epigastric 11/02/2015   Started amitriptyline  at hospitalization 04/13/2016 EGD x 2 negative Also w/ chronic chest wall and back pain Would try not to work up  further    Anxiety    Arthritis    BACK,KNEES   Beta thalassemia minor    diagnosed by hematology Dr. ALONSO Chad, also with sickle cell trait   Blood transfusion without reported diagnosis    LONG TIME AGO   Chronic high back pain 09/24/2015   Colon polyps    GERD (gastroesophageal reflux disease)    Hyperlipidemia    Hypertension    Sickle cell trait    diagnosed by hematology Dr. Chad, coexsisting with beta-thalassemia minor    Past Surgical History:  Procedure Laterality Date   ABDOMINAL HYSTERECTOMY     1998   COLONOSCOPY     ESOPHAGOGASTRODUODENOSCOPY     ESOPHAGOGASTRODUODENOSCOPY [name] 04/13/2016   Procedure: ESOPHAGOGASTRODUODENOSCOPY (EGD);  Surgeon: Lupita FORBES Commander, MD;  Location: [facility];  Service: Endoscopy;  Laterality: [name];    Social History   Socioeconomic History   Marital status: Married    Spouse name: Not on file   Number of children: 3   Years of education: Not on file   Highest education level: Not on file  Occupational History   Not on file  Tobacco Use   Smoking status: Never    Passive exposure: Never   Smokeless tobacco: Never  Vaping Use   Vaping status: Never Used  Substance and Sexual Activity   Alcohol use: No    Alcohol/week: 0.0 standard drinks of alcohol   Drug use: No   Sexual activity: Yes    Comment: 1st intercourse- 58, partner-1, married- 42 yrs   Other Topics Concern   Not on file  Social History Narrative   Lives with husband and works at [employer]   Social Drivers of Corporate Investment Banker Strain: Not on file  Food Insecurity: Not on file  Transportation Needs: Not on file  Physical Activity: Not on file  Stress: Not on file  Social Connections: Not on file  Intimate Partner Violence: Not on file    Family History  Problem Relation Age of Onset   Aneurysm Mother    Diabetes Father    Colon polyps Brother    Colon cancer Brother    Liver cancer Brother    Colon cancer Nephew    Esophageal cancer  Neg Hx    Stomach cancer Neg Hx    Pancreatic cancer Neg Hx    Rectal cancer Neg Hx    Breast cancer Neg Hx      Review of Systems  Constitutional: Negative.  Negative for chills and fever.  HENT: Negative.  Negative for congestion and sore throat.   Respiratory: Negative.  Negative for cough and shortness of breath.   Cardiovascular: Negative.  Negative for chest pain and palpitations.  Gastrointestinal:  Negative for abdominal pain, nausea and vomiting.  Genitourinary:  Negative for dysuria, flank pain, frequency and hematuria.  Skin: Negative.  Negative for rash.  Neurological:  Negative for dizziness  and headaches.    Today's Vitals   11/17/24 1342  BP: 120/82  Pulse: 73  Temp: 98.5 F (36.9 C)  TempSrc: Oral  SpO2: 96%  Weight: 189 lb (85.7 kg)  Height: 5' 6 (1.676 m)   Body mass index is 30.51 kg/m.   Physical Exam Vitals reviewed.  Constitutional:      Appearance: Normal appearance.  HENT:     Head: Normocephalic.     Mouth/Throat:     Mouth: Mucous membranes are moist.     Pharynx: Oropharynx is clear.  Eyes:     Extraocular Movements: Extraocular movements intact.     Pupils: Pupils are equal, round, and reactive to light.  Cardiovascular:     Rate and Rhythm: Normal rate and regular rhythm.     Pulses: Normal pulses.     Heart sounds: Normal heart sounds.  Pulmonary:     Effort: Pulmonary effort is normal.     Breath sounds: Normal breath sounds.  Abdominal:     Tenderness: There is no abdominal tenderness. There is no right CVA tenderness or left CVA tenderness.  Musculoskeletal:     Cervical back: No tenderness.  Lymphadenopathy:     Cervical: No cervical adenopathy.  Skin:    General: Skin is warm and dry.  Neurological:     General: No focal deficit present.     Mental Status: She is alert and oriented to person, place, and time.  Psychiatric:        Mood and Affect: Mood normal.        Behavior: Behavior normal.      ASSESSMENT &  PLAN: Problem List Items Addressed This Visit       Other   Prediabetes   Diet and nutrition discussed Benefits of exercise discussed        Relevant Orders   Hemoglobin A1c   Foamy urine - Primary   Clinically stable.  No red flag signs or symptoms Recent life insurance health evaluation showed protein in the urine Was advised to follow-up with PCP Recommend blood work today and urinalysis Diet and nutrition discussed Follow-up in 3 months      Relevant Orders   Urine Culture   Microalbumin / creatinine urine ratio   Urinalysis   Comprehensive metabolic panel with GFR   CBC with Differential/Platelet   Patient Instructions  Mantenimiento de la salud despus de los 65 aos de edad Health Maintenance After Age 47 Despus de los 65 aos de edad, corre un riesgo mayor de film/video editor enfermedades e infecciones a largo plazo, como tambin de sufrir lesiones por cadas. Las cadas son la causa principal de las fracturas de huesos y lesiones en la cabeza de personas mayores de 65 aos de edad. Recibir cuidados preventivos de forma regular puede ayudarlo a mantenerse saludable y en buen [place]. Los cuidados preventivos incluyen realizarse anlisis de forma regular y forensic psychologist en el estilo de vida segn las recomendaciones del mdico. Converse con el mdico sobre lo siguiente: Las pruebas de deteccin y los anlisis que debe international aid/development worker. Una prueba de deteccin es un estudio que se para engineer, manufacturing la presencia de una enfermedad cuando no tiene sntomas. Un plan de dieta y ejercicios adecuado para usted. Qu debo saber sobre las pruebas de deteccin y los anlisis para prevenir cadas? Realizarse pruebas de deteccin y [employer] es la mejor manera de engineer, manufacturing un problema de salud de forma temprana. El diagnstico y tratamiento tempranos le brindan la mejor oportunidad de  controlar las afecciones mdicas que son comunes despus de los 65 aos de edad. Ciertas afecciones y elecciones  de estilo de vida pueden hacer que sea ms propenso a sufrir engineer, site. El mdico puede recomendarle lo siguiente: Controles regulares de la visin. Una visin deficiente y afecciones como las cataratas pueden hacer que sea ms propenso a sufrir engineer, site. Si usa  lentes, asegrese de obtener una receta actualizada si su visin cambia. Revisin de medicamentos. Revise regularmente con el mdico todos los medicamentos que toma, incluidos los medicamentos de [place]. Consulte al enterprise products efectos secundarios que pueden hacer que sea ms propenso a sufrir engineer, site. Informe al mdico si alguno de los medicamentos que toma lo hace sentir mareado o somnoliento. Controles de fuerza y equilibrio. El mdico puede recomendar ciertos estudios para controlar su fuerza y equilibrio al estar de pie, al caminar o al cambiar de posicin. Examen de los pies. El dolor y materials engineer en los pies, como tambin no utilizar el calzado adecuado, pueden hacer que sea ms propenso a sufrir engineer, site. Pruebas de deteccin, que incluyen las siguientes: Pruebas de deteccin para la osteoporosis. La osteoporosis es una afeccin que hace que los huesos se tornen ms dbiles y se quiebren con ms facilidad. Pruebas de deteccin para la presin arterial. Los cambios en la presin arterial y los medicamentos para chief operating officer la presin arterial pueden hacerlo sentir mareado. Prueba de deteccin de la depresin. Es ms probable que sufra una cada si tiene miedo a caerse, se siente deprimido o se siente incapaz de probation officer. Prueba de deteccin de consumo de alcohol. Beber demasiado alcohol puede afectar su equilibrio y puede hacer que sea ms propenso a sufrir engineer, site. Siga estas indicaciones en su casa: Estilo de vida No beba alcohol si: Su mdico le indica no hacerlo. Si bebe alcohol: Limite la cantidad que bebe a lo siguiente: De 0 a 1 medida por da para las mujeres. De 0 a 2 medidas  por da para los hombres. Sepa cunta cantidad de alcohol hay en las bebidas que toma. En los [address], una medida equivale a una botella de cerveza de 12 oz (355 ml), un vaso de vino de 5 oz (148 ml) o un vaso de una bebida alcohlica de alta graduacin de 1 oz (44 ml). No consuma ningn producto que contenga nicotina o tabaco. Estos productos incluyen cigarrillos, tabaco para theatre manager y aparatos de vapeo, como los cigarrillos electrnicos. Si necesita ayuda para dejar de consumir estos productos, consulte al american express. Actividad  Siga un programa de ejercicio regular para mantenerse en forma. Esto lo ayudar a radio producer equilibrio. Consulte al mdico qu tipos de ejercicios son adecuados para usted. Si necesita un bastn o un andador, selo segn las recomendaciones del mdico. Utilice calzado con buen apoyo y suela antideslizante. Seguridad  Retire los autonation puedan causar tropiezos tales como alfombras, cables u obstculos. Instale equipos de seguridad, como barras para sostn en los baos y barandas de seguridad en las escaleras. Mantenga las habitaciones y los pasillos bien iluminados. Indicaciones generales Hable con el mdico sobre sus riesgos de sufrir una cada. Infrmele a su mdico si: Se cae. Asegrese de informarle a su mdico acerca de todas las cadas, incluso aquellas que parecen ser liberty global. Se siente mareado, cansado (tiene fatiga) o siente que pierde el equilibrio. Use los medicamentos de venta libre y los recetados solamente como se lo haya indicado el mdico. Estos incluyen suplementos. Siga  una dieta sana y [place] un peso saludable. Una dieta saludable incluye productos lcteos descremados, carnes bajas en contenido de grasa ([place]), fibra de granos enteros, frijoles y [place] frutas y verduras. Mantngase al da con las vacunas. Realcese los estudios de rutina de la salud, dentales y de wellsite geologist. Resumen Tener un estilo de vida saludable y recibir cuidados  preventivos pueden ayudar a research scientist (physical sciences) salud y el bienestar despus de los 65 aos de [place]. Realizarse pruebas de deteccin y anlisis es la mejor manera de engineer, manufacturing un problema de salud de forma temprana y ayudarlo a automotive engineer una cada. El diagnstico y tratamiento tempranos le brindan la mejor oportunidad de chief operating officer las afecciones mdicas ms comunes en las personas mayores de 65 aos de edad. Las cadas son la causa principal de las fracturas de huesos y lesiones en la cabeza de personas mayores de 65 aos de edad. Tome precauciones para evitar una cada en su casa. Trabaje con el mdico para saber qu cambios que puede hacer para mejorar su salud y [place], y para prevenir las cadas. Esta informacin no tiene theme park manager el consejo del mdico. Asegrese de hacerle al mdico cualquier pregunta que tenga. Document Revised: 05/17/2021 Document Reviewed: 05/17/2021 Elsevier Patient Education  2024 Elsevier Inc.     Emil Schaumann, MD [facility]

## 2024-11-17 NOTE — Assessment & Plan Note (Signed)
Diet and nutrition discussed Benefits of exercise discussed

## 2024-11-17 NOTE — Patient Instructions (Signed)
 Mantenimiento de la salud despus de los 65 aos de edad Health Maintenance After Age 67 Despus de los 65 aos de edad, corre un riesgo mayor de Film/video editor enfermedades e infecciones a largo plazo, como tambin de sufrir lesiones por cadas. Las cadas son la causa principal de las fracturas de huesos y lesiones en la cabeza de personas mayores de 65 aos de edad. Recibir cuidados preventivos de forma regular puede ayudarlo a mantenerse saludable y en buen [place]. Los cuidados preventivos incluyen realizarse anlisis de forma regular y Forensic psychologist en el estilo de vida segn las recomendaciones del mdico. Converse con el mdico sobre lo siguiente: Las pruebas de deteccin y los anlisis que debe International aid/development worker. Una prueba de deteccin es un estudio que se para Engineer, manufacturing la presencia de una enfermedad cuando no tiene sntomas. Un plan de dieta y ejercicios adecuado para usted. Qu debo saber sobre las pruebas de deteccin y los anlisis para prevenir cadas? Realizarse pruebas de deteccin y [employer] es la mejor manera de Engineer, manufacturing un problema de salud de forma temprana. El diagnstico y tratamiento tempranos le brindan la mejor oportunidad de Chief Operating Officer las afecciones mdicas que son comunes despus de los 65 aos de edad. Ciertas afecciones y elecciones de estilo de vida pueden hacer que sea ms propenso a sufrir Engineer, site. El mdico puede recomendarle lo siguiente: Controles regulares de la visin. Una visin deficiente y afecciones como las cataratas pueden hacer que sea ms propenso a sufrir Engineer, site. Si usa  lentes, asegrese de obtener una receta actualizada si su visin cambia. Revisin de medicamentos. Revise regularmente con el mdico todos los medicamentos que toma, incluidos los medicamentos de [place]. Consulte al [employer] efectos secundarios que pueden hacer que sea ms propenso a sufrir Engineer, site. Informe al mdico si alguno de los medicamentos que toma lo hace sentir mareado o  somnoliento. Controles de fuerza y equilibrio. El mdico puede recomendar ciertos estudios para controlar su fuerza y equilibrio al estar de pie, al caminar o al cambiar de posicin. Examen de los pies. El dolor y Materials engineer en los pies, como tambin no utilizar el calzado adecuado, pueden hacer que sea ms propenso a sufrir Engineer, site. Pruebas de deteccin, que incluyen las siguientes: Pruebas de deteccin para la osteoporosis. La osteoporosis es una afeccin que hace que los huesos se tornen ms dbiles y se quiebren con ms facilidad. Pruebas de deteccin para la presin arterial. Los cambios en la presin arterial y los medicamentos para Chief Operating Officer la presin arterial pueden hacerlo sentir mareado. Prueba de deteccin de la depresin. Es ms probable que sufra una cada si tiene miedo a caerse, se siente deprimido o se siente incapaz de Probation officer. Prueba de deteccin de consumo de alcohol. Beber demasiado alcohol puede afectar su equilibrio y puede hacer que sea ms propenso a sufrir Engineer, site. Siga estas indicaciones en su casa: Estilo de vida No beba alcohol si: Su mdico le indica no hacerlo. Si bebe alcohol: Limite la cantidad que bebe a lo siguiente: De 0 a 1 medida por da para las mujeres. De 0 a 2 medidas por da para los hombres. Sepa cunta cantidad de alcohol hay en las bebidas que toma. En los [address], una medida equivale a una botella de cerveza de 12 oz (355 ml), un vaso de vino de 5 oz (148 ml) o un vaso de una bebida alcohlica de alta graduacin de 1 oz (44 ml). No consuma ningn producto que  contenga nicotina o tabaco. Estos productos incluyen cigarrillos, tabaco para mascar y aparatos de vapeo, como los cigarrillos electrnicos. Si necesita ayuda para dejar de consumir estos productos, consulte al [employer]. Actividad  Siga un programa de ejercicio regular para mantenerse en forma. Esto lo ayudar a Radio producer equilibrio. Consulte al  mdico qu tipos de ejercicios son adecuados para usted. Si necesita un bastn o un andador, selo segn las recomendaciones del mdico. Utilice calzado con buen apoyo y suela antideslizante. Seguridad  Retire los [employer] puedan causar tropiezos tales como alfombras, cables u obstculos. Instale equipos de seguridad, como barras para sostn en los baos y barandas de seguridad en las escaleras. Mantenga las habitaciones y los pasillos bien iluminados. Indicaciones generales Hable con el mdico sobre sus riesgos de sufrir una cada. Infrmele a su mdico si: Se cae. Asegrese de informarle a su mdico acerca de todas las cadas, incluso aquellas que parecen ser [employer]. Se siente mareado, cansado (tiene fatiga) o siente que pierde el equilibrio. Use los medicamentos de venta libre y los recetados solamente como se lo haya indicado el mdico. Estos incluyen suplementos. Siga una dieta sana y [place] un peso saludable. Una dieta saludable incluye productos lcteos descremados, carnes bajas en contenido de grasa ([place]), fibra de granos enteros, frijoles y [place] frutas y verduras. Mantngase al da con las vacunas. Realcese los estudios de rutina de la salud, dentales y de Wellsite geologist. Resumen Tener un estilo de vida saludable y recibir cuidados preventivos pueden ayudar a Research scientist (physical sciences) salud y el bienestar despus de los 65 aos de [place]. Realizarse pruebas de deteccin y anlisis es la mejor manera de Engineer, manufacturing un problema de salud de forma temprana y ayudarlo a Automotive engineer una cada. El diagnstico y tratamiento tempranos le brindan la mejor oportunidad de Chief Operating Officer las afecciones mdicas ms comunes en las personas mayores de 65 aos de edad. Las cadas son la causa principal de las fracturas de huesos y lesiones en la cabeza de personas mayores de 65 aos de edad. Tome precauciones para evitar una cada en su casa. Trabaje con el mdico para saber qu cambios que puede hacer para mejorar su salud y  [place], y para prevenir las cadas. Esta informacin no tiene Theme park manager el consejo del mdico. Asegrese de hacerle al mdico cualquier pregunta que tenga. Document Revised: 05/17/2021 Document Reviewed: 05/17/2021 Elsevier Patient Education  2024 [employer].

## 2024-11-18 ENCOUNTER — Ambulatory Visit: Payer: Self-pay | Admitting: Emergency Medicine

## 2024-11-18 LAB — MICROALBUMIN / CREATININE URINE RATIO
Creatinine,U: 121.3 mg/dL
Microalb Creat Ratio: 14.6 mg/g (ref 0.0–30.0)
Microalb, Ur: 1.8 mg/dL (ref 0.0–1.9)

## 2024-11-18 LAB — URINE CULTURE: Result:: NO GROWTH

## 2024-11-26 ENCOUNTER — Telehealth: Payer: Self-pay

## 2024-11-26 DIAGNOSIS — R82998 Other abnormal findings in urine: Secondary | ICD-10-CM

## 2024-11-26 NOTE — Telephone Encounter (Signed)
 Copied from CRM #[id number]. Topic: Referral - Request for Referral >> Nov 26, 2024  2:23 PM Alfonso ORN wrote: Did the patient discuss referral with their provider in the last year? No, due to recent lab results ordered by pcp   Appointment offered? No  Type of order/referral and detailed reason for visit: nephrologist   Preference of office, provider, location: none  If referral order, have you been seen by this specialty before? No   Can we respond through MyChart? Yes

## 2024-11-27 NOTE — Telephone Encounter (Signed)
 Referral has been placed.

## 2024-11-27 NOTE — Telephone Encounter (Signed)
 Please advise

## 2024-12-01 ENCOUNTER — Telehealth: Payer: Self-pay | Admitting: Cardiology

## 2024-12-01 NOTE — Telephone Encounter (Signed)
 Pt c/o of Chest Pain: STAT if active (IN THIS MOMENT) CP, including tightness, pressure, jaw pain, shoulder/upper arm/back pain, SOB, nausea, and vomiting.  1. Are you having CP right now (tightness, pressure, or discomfort)? Discomfort   2. Are you experiencing any other symptoms (ex. SOB, nausea, vomiting, sweating)? SOB a little   3. How long have you been experiencing CP? Unknown   4. Is your CP continuous or coming and going? Coming and going   5. Have you taken Nitroglycerin ? Unknown   6. If CP returns before callback, please consider calling 911. ?

## 2024-12-01 NOTE — Telephone Encounter (Signed)
 Left message for patient to callback using interpreter service (Agent: [place], [place] [id number]).   From Dr. Denver last office visit note on 06/04/24: In the absence of any further symptoms then I do not think further testing is indicated particularly since she has a very active lifestyle. However, if she has chest discomfort or shortness of breath in the future she will let me know and we will do further testing.

## 2024-12-08 NOTE — Telephone Encounter (Signed)
 Left voice message with [employer] [phone]) to call back 12/16

## 2024-12-10 NOTE — Telephone Encounter (Signed)
 Left voice message with [employer] [phone]) to call back 12/18

## 2024-12-18 NOTE — Telephone Encounter (Signed)
 Attempted to contact pt multiple times. Will send letter asking pt to call out office

## 2025-01-18 NOTE — Progress Notes (Unsigned)
 " Cardiology Office Note:   Date:  01/19/2025  ID:  Emily [name], Emily [name] [birth year], MRN [id number] PCP: Purcell Emil Schanz, MD  [facility] Providers Cardiologist:  Lynwood Schilling, MD {  History of Present Illness:   Emily [name] is a 69 y.o. female  with chest pain.    She was seen in the office in June 2025.  She had chest pain and SOB.  She had a CT in Feb 2025 without PE.  Echo in April demonstrated a normal EF.   There were no significant valvular abnormalities.  She did have coronary artery  calcium .   She was going to have perfusion study.  However, this did not happen.  She did have some improvement in her shortness of breath noted after taking Lasix .  She is thought to have some diastolic dysfunction.   She presents for follow-up.  She presents because she said we wanted her to have repeat labs after increasing her Crestor .  It sounds like she has been compliant with that.  She thought she might be having some memory issues.  She also describes some shortness of breath.  She has not been in the emergency room since several months before I saw her last year.  She has not had any hospitalizations.  This entire visit was done with a Spanish interpreter at the bedside.  The patient describes some feeling of fullness in her head and difficulty swallowing.  She feels like she has to swallow and that she gets some chest discomfort.  She becomes anxious with this.  She says her breathing is actually better than when she saw me before.  She cannot really quantify or qualify the chest discomfort.  She thinks it happened started around December.  She is not describing fevers or chills.  She is not describing PND or orthopnea.  She is not having any palpitations, presyncope or syncope.  ROS: As stated in the HPI and negative for all other systems.  Studies Reviewed:    EKG:       ***  Risk Assessment/Calculations:              Physical Exam:   VS:  BP 120/74 (BP  Location: Left Arm, Patient Position: Sitting, Cuff Size: Normal)   Pulse 64   Ht 5' 6 (1.676 m)   Wt 189 lb (85.7 kg)   SpO2 96%   BMI 30.51 kg/m    Wt Readings from Last 3 Encounters:  01/19/25 189 lb (85.7 kg)  11/17/24 189 lb (85.7 kg)  10/20/24 188 lb 6.4 oz (85.5 kg)     GEN: Well nourished, well developed in no acute distress NECK: No JVD; No carotid bruits CARDIAC: RRR, no murmurs, rubs, gallops RESPIRATORY:  Clear to auscultation without rales, wheezing or rhonchi  ABDOMEN: Soft, non-tender, non-distended EXTREMITIES:  No edema; No deformity   ASSESSMENT AND PLAN:   Chest pain / Shortness of breath :     Her chest discomfort has nonanginal greater than anginal features and it is very difficult to sort out.  The pretest probability of obstructive coronary disease I think is very low and so I would not suggest further testing unless she has more classic symptoms or progressive symptoms.  I did send a note to her primary provider to see if he can see her back in close to the symptoms as well and if there is any suggestion that this might be cardiac I be happy to reevaluate.  She  does state her breathing is better than it used to be.  For now we will continue with aggressive risk reduction.    Coronary artery calcification :   I would like an LDL to be in the 70s.  Her last 1 was 90 in October.  I am going to add Zetia  10 mg daily and repeat a lipid profile in about 3 months.   Dyslipidemia: As above.  Hypertension: Her blood pressure is at target.  No change in therapy.   At target.  No change in therapy.      Follow up ***  Signed, Lynwood Schilling, MD   "

## 2025-01-19 ENCOUNTER — Encounter: Payer: Self-pay | Admitting: Cardiology

## 2025-01-19 ENCOUNTER — Ambulatory Visit: Admitting: Cardiology

## 2025-01-19 ENCOUNTER — Other Ambulatory Visit (HOSPITAL_COMMUNITY): Payer: Self-pay

## 2025-01-19 VITALS — BP 120/74 | HR 64 | Ht 66.0 in | Wt 189.0 lb

## 2025-01-19 DIAGNOSIS — R072 Precordial pain: Secondary | ICD-10-CM

## 2025-01-19 DIAGNOSIS — I2510 Atherosclerotic heart disease of native coronary artery without angina pectoris: Secondary | ICD-10-CM | POA: Diagnosis not present

## 2025-01-19 DIAGNOSIS — E785 Hyperlipidemia, unspecified: Secondary | ICD-10-CM

## 2025-01-19 DIAGNOSIS — I10 Essential (primary) hypertension: Secondary | ICD-10-CM

## 2025-01-19 MED ORDER — EZETIMIBE 10 MG PO TABS
10.0000 mg | ORAL_TABLET | Freq: Every day | ORAL | 3 refills | Status: AC
  Filled 2025-01-19 (×2): qty 90, 90d supply, fill #0
  Filled 2025-01-19: qty 30, 30d supply, fill #0

## 2025-01-19 NOTE — Patient Instructions (Addendum)
 Medication Instructions:  Start Zetia  10 mg once daily *If you need a refill on your cardiac medications before your next appointment, please call your pharmacy*  Lab Work: Fasting lipid panel in 3 months at [facility] [place] (end of April) If you have labs (blood work) drawn today and your tests are completely normal, you will receive your results only by: MyChart Message (if you have MyChart) OR A paper copy in the mail If you have any lab test that is abnormal or we need to change your treatment, we will call you to review the results.  Testing/Procedures: NONE  Follow-Up: At [facility], you and your health needs are our priority.  As part of our continuing mission to provide you with exceptional heart care, our providers are all part of one team.  This team includes your primary Cardiologist (physician) and Advanced Practice Providers or APPs (Physician Assistants and Nurse Practitioners) who all work together to provide you with the care you need, when you need it.  Your next appointment:   As needed  Provider:   Lynwood Schilling, MD    We recommend signing up for the patient portal called MyChart.  Sign up information is provided on this After Visit Summary.  MyChart is used to connect with patients for Virtual Visits (Telemedicine).  Patients are able to view lab/test results, encounter notes, upcoming appointments, etc.  Non-urgent messages can be sent to your provider as well.   To learn more about what you can do with MyChart, go to [email or web address].   Other Instructions

## 2025-01-26 ENCOUNTER — Encounter: Payer: Self-pay | Admitting: Internal Medicine

## 2025-01-26 ENCOUNTER — Ambulatory Visit: Admitting: Internal Medicine

## 2025-01-26 VITALS — BP 116/67 | HR 62 | Temp 98.3°F | Resp 16 | Ht 63.5 in | Wt 187.5 lb

## 2025-01-26 DIAGNOSIS — G8929 Other chronic pain: Secondary | ICD-10-CM

## 2025-01-26 DIAGNOSIS — M1711 Unilateral primary osteoarthritis, right knee: Secondary | ICD-10-CM | POA: Insufficient documentation

## 2025-01-26 DIAGNOSIS — R7689 Other specified abnormal immunological findings in serum: Secondary | ICD-10-CM | POA: Insufficient documentation

## 2025-01-26 NOTE — Assessment & Plan Note (Signed)
 SABRA

## 2025-01-27 LAB — ANTI-DNA ANTIBODY, DOUBLE-STRANDED: ds DNA Ab: 1 [IU]/mL

## 2025-01-27 LAB — SJOGRENS SYNDROME-A EXTRACTABLE NUCLEAR ANTIBODY: SSA (Ro) (ENA) Antibody, IgG: <1 AI

## 2025-01-27 LAB — RNP ANTIBODY: Ribonucleic Protein(ENA) Antibody, IgG: <1 AI

## 2025-01-27 LAB — SEDIMENTATION RATE: Sed Rate: 9 mm/h (ref 0–30)

## 2025-01-27 LAB — C3 AND C4
C3 Complement: 137 mg/dL (ref 83–193)
C4 Complement: 28 mg/dL (ref 15–57)

## 2025-01-27 LAB — ANTI-SMITH ANTIBODY: ENA SM Ab Ser-aCnc: <1 AI
